# Patient Record
Sex: Male | Born: 1945 | Race: White | Hispanic: No | Marital: Married | Smoking: Never smoker
Health system: Southern US, Community
[De-identification: ages and names within clinical notes are randomized; demographics above are authoritative.]

## PROBLEM LIST (undated history)

## (undated) DIAGNOSIS — I499 Cardiac arrhythmia, unspecified: Secondary | ICD-10-CM

## (undated) DIAGNOSIS — I10 Essential (primary) hypertension: Secondary | ICD-10-CM

## (undated) DIAGNOSIS — M51369 Other intervertebral disc degeneration, lumbar region without mention of lumbar back pain or lower extremity pain: Secondary | ICD-10-CM

## (undated) DIAGNOSIS — K219 Gastro-esophageal reflux disease without esophagitis: Secondary | ICD-10-CM

## (undated) DIAGNOSIS — N400 Enlarged prostate without lower urinary tract symptoms: Secondary | ICD-10-CM

## (undated) DIAGNOSIS — D649 Anemia, unspecified: Secondary | ICD-10-CM

## (undated) DIAGNOSIS — E859 Amyloidosis, unspecified: Secondary | ICD-10-CM

## (undated) DIAGNOSIS — M5136 Other intervertebral disc degeneration, lumbar region: Secondary | ICD-10-CM

## (undated) DIAGNOSIS — R52 Pain, unspecified: Secondary | ICD-10-CM

## (undated) DIAGNOSIS — T7840XA Allergy, unspecified, initial encounter: Secondary | ICD-10-CM

## (undated) DIAGNOSIS — R008 Other abnormalities of heart beat: Secondary | ICD-10-CM

## (undated) DIAGNOSIS — I340 Nonrheumatic mitral (valve) insufficiency: Secondary | ICD-10-CM

## (undated) DIAGNOSIS — A809 Acute poliomyelitis, unspecified: Secondary | ICD-10-CM

## (undated) DIAGNOSIS — J3089 Other allergic rhinitis: Secondary | ICD-10-CM

## (undated) DIAGNOSIS — R55 Syncope and collapse: Secondary | ICD-10-CM

## (undated) HISTORY — PX: FOOT SURGERY: SHX648

## (undated) HISTORY — PX: PILONIDAL CYST / SINUS EXCISION: SUR543

## (undated) HISTORY — PX: NECK SURGERY: SHX720

## (undated) HISTORY — PX: OTHER SURGICAL HISTORY: SHX169

---

## 1982-12-10 HISTORY — PX: CIRCUMCISION: SUR203

## 1999-11-21 ENCOUNTER — Encounter: Payer: Self-pay | Admitting: Neurosurgery

## 1999-11-23 ENCOUNTER — Encounter: Payer: Self-pay | Admitting: Neurosurgery

## 1999-11-23 ENCOUNTER — Inpatient Hospital Stay (HOSPITAL_COMMUNITY): Admission: RE | Admit: 1999-11-23 | Discharge: 1999-11-25 | Payer: Self-pay | Admitting: Neurosurgery

## 1999-12-15 ENCOUNTER — Encounter: Payer: Self-pay | Admitting: Neurosurgery

## 1999-12-15 ENCOUNTER — Encounter: Admission: RE | Admit: 1999-12-15 | Discharge: 1999-12-15 | Payer: Self-pay | Admitting: Neurosurgery

## 2000-01-17 ENCOUNTER — Encounter: Admission: RE | Admit: 2000-01-17 | Discharge: 2000-01-17 | Payer: Self-pay | Admitting: Neurosurgery

## 2000-01-17 ENCOUNTER — Encounter: Payer: Self-pay | Admitting: Neurosurgery

## 2000-02-19 ENCOUNTER — Encounter: Admission: RE | Admit: 2000-02-19 | Discharge: 2000-02-19 | Payer: Self-pay | Admitting: Neurosurgery

## 2000-02-19 ENCOUNTER — Encounter: Payer: Self-pay | Admitting: Neurosurgery

## 2006-11-25 ENCOUNTER — Ambulatory Visit: Payer: Self-pay | Admitting: Gastroenterology

## 2012-03-10 HISTORY — PX: COLONOSCOPY: SHX174

## 2012-03-28 ENCOUNTER — Ambulatory Visit: Payer: Self-pay | Admitting: Gastroenterology

## 2012-10-29 ENCOUNTER — Encounter (HOSPITAL_COMMUNITY): Payer: Self-pay

## 2012-10-29 ENCOUNTER — Emergency Department (HOSPITAL_COMMUNITY): Payer: 59

## 2012-10-29 ENCOUNTER — Observation Stay (HOSPITAL_COMMUNITY)
Admission: EM | Admit: 2012-10-29 | Discharge: 2012-10-31 | Disposition: A | Discharge status: Home / Self Care | Payer: 59 | Attending: Cardiology | Admitting: Cardiology

## 2012-10-29 DIAGNOSIS — Z79899 Other long term (current) drug therapy: Secondary | ICD-10-CM | POA: Insufficient documentation

## 2012-10-29 DIAGNOSIS — Z7982 Long term (current) use of aspirin: Secondary | ICD-10-CM | POA: Insufficient documentation

## 2012-10-29 DIAGNOSIS — I4510 Unspecified right bundle-branch block: Secondary | ICD-10-CM | POA: Insufficient documentation

## 2012-10-29 DIAGNOSIS — I10 Essential (primary) hypertension: Secondary | ICD-10-CM

## 2012-10-29 DIAGNOSIS — R809 Proteinuria, unspecified: Secondary | ICD-10-CM

## 2012-10-29 DIAGNOSIS — R0602 Shortness of breath: Secondary | ICD-10-CM | POA: Insufficient documentation

## 2012-10-29 DIAGNOSIS — R55 Syncope and collapse: Principal | ICD-10-CM

## 2012-10-29 DIAGNOSIS — R7989 Other specified abnormal findings of blood chemistry: Secondary | ICD-10-CM | POA: Insufficient documentation

## 2012-10-29 DIAGNOSIS — Z889 Allergy status to unspecified drugs, medicaments and biological substances status: Secondary | ICD-10-CM | POA: Insufficient documentation

## 2012-10-29 DIAGNOSIS — I452 Bifascicular block: Secondary | ICD-10-CM

## 2012-10-29 HISTORY — DX: Essential (primary) hypertension: I10

## 2012-10-29 HISTORY — DX: Nonrheumatic mitral (valve) insufficiency: I34.0

## 2012-10-29 HISTORY — DX: Syncope and collapse: R55

## 2012-10-29 LAB — HEMOGLOBIN A1C
Hgb A1c MFr Bld: 5.7 % — ABNORMAL HIGH (ref <5.7)
Mean Plasma Glucose: 117 mg/dL — ABNORMAL HIGH (ref <117)

## 2012-10-29 LAB — URINALYSIS, ROUTINE W REFLEX MICROSCOPIC
Hgb urine dipstick: NEGATIVE
Nitrite: NEGATIVE
Protein, ur: >300 mg/dL — AB
Specific Gravity, Urine: 1.017 (ref 1.005–1.030)
Urobilinogen, UA: 1 mg/dL (ref 0.0–1.0)

## 2012-10-29 LAB — CBC WITH DIFFERENTIAL/PLATELET
Eosinophils Relative: 3 % (ref 0–5)
HCT: 35.4 % — ABNORMAL LOW (ref 39.0–52.0)
Hemoglobin: 12.8 g/dL — ABNORMAL LOW (ref 13.0–17.0)
Lymphocytes Relative: 20 % (ref 12–46)
Lymphs Abs: 1.6 10*3/uL (ref 0.7–4.0)
MCV: 88.1 fL (ref 78.0–100.0)
Monocytes Absolute: 0.8 10*3/uL (ref 0.1–1.0)
Monocytes Relative: 10 % (ref 3–12)
Neutro Abs: 5.3 10*3/uL (ref 1.7–7.7)
RDW: 13.3 % (ref 11.5–15.5)
WBC: 8 10*3/uL (ref 4.0–10.5)

## 2012-10-29 LAB — COMPREHENSIVE METABOLIC PANEL
AST: 45 U/L — ABNORMAL HIGH (ref 0–37)
BUN: 23 mg/dL (ref 6–23)
CO2: 24 mEq/L (ref 19–32)
Calcium: 8.3 mg/dL — ABNORMAL LOW (ref 8.4–10.5)
Chloride: 105 mEq/L (ref 96–112)
Creatinine, Ser: 0.98 mg/dL (ref 0.50–1.35)
GFR calc Af Amer: >90 mL/min (ref >90)
GFR calc non Af Amer: 83 mL/min — ABNORMAL LOW (ref >90)
Glucose, Bld: 102 mg/dL — ABNORMAL HIGH (ref 70–99)
Total Bilirubin: 0.3 mg/dL (ref 0.3–1.2)

## 2012-10-29 LAB — PROTIME-INR: Prothrombin Time: 13.8 seconds (ref 11.6–15.2)

## 2012-10-29 LAB — URINE MICROSCOPIC-ADD ON

## 2012-10-29 LAB — GLUCOSE, CAPILLARY: Glucose-Capillary: 103 mg/dL — ABNORMAL HIGH (ref 70–99)

## 2012-10-29 LAB — D-DIMER, QUANTITATIVE: D-Dimer, Quant: <0.27 ug/mL-FEU (ref 0.00–0.48)

## 2012-10-29 LAB — POCT I-STAT TROPONIN I

## 2012-10-29 LAB — TROPONIN I: Troponin I: <0.3 ng/mL (ref <0.30)

## 2012-10-29 MED ORDER — MONTELUKAST SODIUM 10 MG PO TABS
10.0000 mg | ORAL_TABLET | Freq: Every day | ORAL | Status: DC
Start: 2012-10-29 — End: 2012-10-31
  Administered 2012-10-29 – 2012-10-30 (×2): 10 mg via ORAL
  Filled 2012-10-29 (×3): qty 1

## 2012-10-29 MED ORDER — LORATADINE 10 MG PO TABS
10.0000 mg | ORAL_TABLET | Freq: Every day | ORAL | Status: DC
  Administered 2012-10-29 – 2012-10-30 (×2): 10 mg via ORAL
  Filled 2012-10-29 (×3): qty 1

## 2012-10-29 MED ORDER — SODIUM CHLORIDE 0.9 % IV SOLN
INTRAVENOUS | Status: DC
  Administered 2012-10-29: 17:00:00 via INTRAVENOUS

## 2012-10-29 MED ORDER — SODIUM CHLORIDE 0.9 % IV SOLN
Freq: Once | INTRAVENOUS | Status: AC
  Administered 2012-10-29: 15:00:00 via INTRAVENOUS

## 2012-10-29 MED ORDER — ENOXAPARIN SODIUM 40 MG/0.4ML ~~LOC~~ SOLN
40.0000 mg | SUBCUTANEOUS | Status: DC
  Administered 2012-10-29 – 2012-10-31 (×3): 40 mg via SUBCUTANEOUS
  Filled 2012-10-29 (×3): qty 0.4

## 2012-10-29 MED ORDER — PANTOPRAZOLE SODIUM 40 MG PO TBEC
40.0000 mg | DELAYED_RELEASE_TABLET | Freq: Every day | ORAL | Status: DC
  Administered 2012-10-29 – 2012-10-31 (×3): 40 mg via ORAL
  Filled 2012-10-29 (×3): qty 1

## 2012-10-29 MED ORDER — LEVOCETIRIZINE DIHYDROCHLORIDE 5 MG PO TABS: 5.0000 mg | ORAL_TABLET | Freq: Every day | ORAL | Status: DC

## 2012-10-29 MED ORDER — ACETAMINOPHEN 500 MG PO TABS
500.0000 mg | ORAL_TABLET | Freq: Three times a day (TID) | ORAL | Status: DC | PRN
  Filled 2012-10-29: qty 2

## 2012-10-29 MED ORDER — ASPIRIN EC 81 MG PO TBEC
81.0000 mg | DELAYED_RELEASE_TABLET | Freq: Every evening | ORAL | Status: DC
  Administered 2012-10-29 – 2012-10-31 (×3): 81 mg via ORAL
  Filled 2012-10-29 (×3): qty 1

## 2012-10-29 MED ORDER — SODIUM CHLORIDE 0.9 % IJ SOLN
3.0000 mL | Freq: Two times a day (BID) | INTRAMUSCULAR | Status: DC
  Administered 2012-10-30 (×2): 3 mL via INTRAVENOUS

## 2012-10-29 NOTE — ED Notes (Signed)
Checked patient sugar it was 103 notified RN Lori of blood sugar

## 2012-10-29 NOTE — Progress Notes (Signed)
3:45 PM  Date: 10/29/2012  Rate: 103  Rhythm: sinus tachycardia  QRS Axis: normal  Intervals: normal QRS:  Left ventricular hypertrophy  ST/T Wave abnormalities: normal  Conduction Disutrbances:none  Narrative Interpretation: Abnormal EKG  Old EKG Reviewed: changes noted--had normal EKG on 10/02/2001.  Elderly man has had rapid downhill course with back pain, altered mental status in past 3 days.  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 Physical exam shows him to be an elderly man who seems mentally slow but is cooperative.  Chest clear, heart sounds normal.  Abdomen soft, no mass or tenderness.  He is awake and alert, no sensory or motor deficit.  Lab workup including CT angio of abdomen and pelvis to evaluate his status.

## 2012-10-29 NOTE — H&P (Signed)
Hospital Admission Note Date: 10/29/2012  Patient name: Eugene Martinez Medical record number: [id number] Date of birth: January 09, 1946 Age: 66 y.o. Gender: male PCP: DEFAULT,PROVIDER, MD  Medical Service: Cardiology  Chief Complaint: near syncope  History of Present Illness: The patient is a pleasant 66 YO man who comes in today with near syncope at work after climbing two sets of stairs. He has been feeling well otherwise. No N/V/D in the last week. He saw his PCP last week and a blood pressure medicine HCTZ was stopped due to feelings of dizziness and SOB. He states that after 3 flights of stairs he gets SOB. No PND. No syncope in the past. No jerking movements or total LOC during event. No cardiac history or MI history or stents. He has had distant stress test which he states was normal and distant echo which he states was normal. He has no family history of any cardiac problems. He ate breakfast the morning of episode and glucose check was 100s by ED staff upon arrival. He stood for CXR without any feelings of dizziness. His only PMH is HTN, BPH, and allergies. He is taking terazosin and lisinopril and allergy medications. He is currently feeling well and not dizzy. No old EKG but RBBB on EKG. He states he had been told he had a benign murmur in the past.   Meds: Current Outpatient Rx  Name  Route  Sig  Dispense  Refill  . ACETAMINOPHEN 500 MG PO TABS   Oral   Take 500-1,000 mg by mouth every 8 (eight) hours as needed. For sinus headaches         . ASPIRIN EC 81 MG PO TBEC   Oral   Take 81 mg by mouth every evening.         . AZELASTINE HCL 137 MCG/SPRAY NA SOLN   Nasal   Place 1-2 sprays into the nose every 12 (twelve) hours as needed. Use in each nostril as directed. For allergies         . VITAMIN D 1000 UNITS PO TABS   Oral   Take 1,000 Units by mouth daily.         Marland Kitchen FINASTERIDE 5 MG PO TABS   Oral   Take 5 mg by mouth daily.         Marland Kitchen LEVOCETIRIZINE  DIHYDROCHLORIDE 5 MG PO TABS   Oral   Take 5 mg by mouth at bedtime. To relieve allergy symptoms         . LISINOPRIL 40 MG PO TABS   Oral   Take 40 mg by mouth daily. For blood pressure         . MONTELUKAST SODIUM 10 MG PO TABS   Oral   Take 10 mg by mouth at bedtime.         . CVS SPECTRAVITE SENIOR PO TABS   Oral   Take 1 tablet by mouth daily.         Marland Kitchen FISH OIL 1200 MG PO CAPS   Oral   Take 1,200 mg by mouth 2 (two) times daily. Takes 1 in the morning and 1 at night         . OMEPRAZOLE 20 MG PO CPDR   Oral   Take 20 mg by mouth daily before breakfast. Takes 30 minutes before breakfast for acid reflux         . TAMSULOSIN HCL 0.4 MG PO CAPS   Oral   Take 0.4 mg by  mouth daily.         . TRIAMCINOLONE ACETONIDE 55 MCG/ACT NA INHA   Nasal   Place 1-2 sprays into the nose daily as needed. For allergies         . VITAMIN E 400 UNITS PO CAPS   Oral   Take 400 Units by mouth daily.           Allergies: Allergies as of 10/29/2012  . (No Known Allergies)   Past Medical History  Diagnosis Date  . Hypertension    History reviewed. No pertinent past surgical history. No family history on file. History   Social History  . Marital Status: Married    Spouse Name: N/A    Number of Children: N/A  . Years of Education: N/A   Occupational History  . Not on file.   Social History Main Topics  . Smoking status: Never Smoker   . Smokeless tobacco: Never Used  . Alcohol Use: No  . Drug Use: No  . Sexually Active:    Other Topics Concern  . Not on file   Social History Narrative  . No narrative on file    Review of Systems: Pertinent items are noted in HPI.  Physical Exam: Blood pressure 87/46, pulse 91, temperature 97.8 F (36.6 C), temperature source Oral, resp. rate 15, SpO2 97.00%. General: resting in bed, in no distress HEENT: PERRL, EOMI, no scleral icterus Cardiac: S1 S2 heard, S3 heard, no carotid bruits auscultated Pulm:  clear to auscultation bilaterally, moving normal volumes of air Abd: soft, nontender, nondistended, BS present Ext: warm and well perfused, no pedal edema Neuro: alert and oriented X3, cranial nerves II-XII grossly intact   Lab results: Basic Metabolic Panel:  Basename 10/29/12 1429  NA 137  K 4.3  CL 105  CO2 24  GLUCOSE 102*  BUN 23  CREATININE 0.98  CALCIUM 8.3*  MG --  PHOS --   Liver Function Tests:  Basename 10/29/12 1429  AST 45*  ALT 61*  ALKPHOS 49  BILITOT 0.3  PROT 4.9*  ALBUMIN 2.6*   CBC:  Basename 10/29/12 1429  WBC 8.0  NEUTROABS 5.3  HGB 12.8*  HCT 35.4*  MCV 88.1  PLT 153   Cardiac Enzymes: POC troponin - 0.02 (negative) CBG:  Basename 10/29/12 1410  GLUCAP 103*   Coagulation:  Basename 10/29/12 1429  LABPROT 13.8  INR 1.07   Urinalysis:  Basename 10/29/12 1539  COLORURINE YELLOW  LABSPEC 1.017  PHURINE 6.0  GLUCOSEU NEGATIVE  HGBUR NEGATIVE  BILIRUBINUR NEGATIVE  KETONESUR NEGATIVE  PROTEINUR >300*  UROBILINOGEN 1.0  NITRITE NEGATIVE  LEUKOCYTESUR NEGATIVE   Imaging results:  Dg Chest 2 View  10/29/2012  *RADIOLOGY REPORT*  Clinical Data: Syncope  CHEST - 2 VIEW  Comparison: None.  Findings: No focal airspace consolidation or pulmonary edema. There is a tiny right pleural effusion. Cardiopericardial silhouette is at upper limits of normal for size. Imaged bony structures of the thorax are intact.  IMPRESSION: Tiny right pleural effusion.  Otherwise unremarkable.   Original Report Authenticated By: Kennith Center, M.D.     Other results: EKG: there are no previous tracings available for comparison, RBBB, possible left anterior fascicular block.  Assessment & Plan by Problem: 1. Pre-syncope - The patient was likely vasovagal although RBBB is possibly a new finding and could be concerning. Would admit to telemetry and monitor overnight with Echo tomorrow. Will trend troponin and would check EKG in the morning. Would check  orthostatic  vital signs on admission. Will check HgA1c and d-dimer.   2. Hypertension - The patient is more hypotensive at this time and would hold all BP medications. He has recently been trying to lose weight and this may be that he is now on more BP meds than he needs.   3. Allergies - Can be continued on his home allergy medications.   4. Proteinuria -  Should be worked up with repeat U/A and may need SPEP/UPEP.   5. DVT ppx - lovenox Sharon daily.   SignedGenella Mech 10/29/2012, 4:37 PM   This pleasant gentleman was seen in the emergency room with Dr. Dorise Hiss.  He lives in the [place] area and normally receives his medical care through the [facility].  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  He had been sitting listening to lectures all morning.  When the group broke to go to lunch he had climbed 2 flights of stairs and became short of breath and had near syncope which was witnessed by his coworkers.  They called 911 and he was brought to the emergency room.  The patient does not have any history of known previous heart problems.  He has not been told of an abnormal EKG in the past.  Perhaps 10 or 20 years ago he remembers having an echocardiogram and a stress test and was told that he was okay.  The patient has been in good general health.  He denies any anemia or chronic disease.  He does not have any history of exertional chest pain.  He has noted some exertional dyspnea recently.  He sleeps on 2 pillows but denies paroxysmal nocturnal dyspnea, and he has not been experiencing any peripheral edema.  He has been experiencing some lightheadedness and his primary care provider recently stopped his hydrochlorothiazide because of a finding of low blood pressure. 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  We do not have any prior EKGs to  compare.  His chest x-ray shows borderline cardiomegaly but no overt CHF.  Laboratory studies in the emergency room show mild anemia and mild elevation of liver function studies.  His initial troponin level is normal. His physical examination reveals clear lung fields.  He does have a soft systolic murmur at the left sternal edge and he has a soft S3 gallop rhythm. Initial impression is that the patient has had syncope probably secondary to vasovagal reaction, with low blood pressure secondary to blood pressure medications which are being held.  However his physical finding suggests possible left ventricular systolic dysfunction with an S3 gallop and heart rate of 98 and borderline cardiomegaly on chest x-ray.  The patient will be admitted to the CDU on telemetry.  We will obtain echocardiogram to evaluate LV function.  If LV function is significantly depressed he may need cardiac catheterization.  We will followup on abnormal liver function studies and get a followup CBC.  Will get a d-dimer although he has not had any pleuritic chest pain or any chest pain.

## 2012-10-29 NOTE — ED Notes (Signed)
Pt. Was at the [facility] and had a near syncopal episode.  Employees observed pt. Walking up 2 flights of steps and became sob. Pt. Felt like he was going to pass out and leaned against the wall and sat down.  Denies hitting his head. Pt. Remembers waking up on the floor.  Pt. Denies any sob or chest pain presently.  Pt. Was seen last week with Dr. Kennon Rounds for sob , They did not find anything abnormal. Pt. Is pale , warm and dry

## 2012-10-29 NOTE — ED Provider Notes (Signed)
History     CSN: [id number]  Arrival date & time 10/29/12  1323   First MD Initiated Contact with Patient 10/29/12 1406      Chief Complaint  Patient presents with  . Near Syncope    (Consider location/radiation/quality/duration/timing/severity/associated sxs/prior treatment) HPI Comments: Pt is a 66 year old man who was at a conference at [employer].  He climbed two flights of stairs and became short of breath and dizzy, collapsed to the floor, and nearly lost consciousness.  He had seen his doctor last Thursday and had some medications for high blood pressure  discontinued.  Patient is a 66 y.o. male presenting with syncope. The history is provided by the patient. No language interpreter was used.  Loss of Consciousness This is a new problem. The current episode started less than 1 hour ago. Episode frequency: One episode of near syncope today. The problem has been rapidly improving. Associated symptoms include shortness of breath. Pertinent negatives include no chest pain and no abdominal pain. Exacerbated by: Precipitated by climbing two flights of stairs. Nothing relieves the symptoms. Treatments tried: To [facility] ED via EMS for evaluation.    Past Medical History  Diagnosis Date  . Hypertension     History reviewed. No pertinent past surgical history.  No family history on file.  History  Substance Use Topics  . Smoking status: Never Smoker   . Smokeless tobacco: Never Used  . Alcohol Use: No      Review of Systems  Constitutional: Negative for fever and chills.  HENT: Negative.   Eyes: Negative.   Respiratory: Positive for shortness of breath.   Cardiovascular: Positive for syncope. Negative for chest pain.  Gastrointestinal: Negative.  Negative for abdominal pain.  Genitourinary: Negative.   Musculoskeletal: Negative.   Skin: Negative.   Neurological: Positive for syncope.  Psychiatric/Behavioral: Negative.     Allergies  Review of patient's  allergies indicates no known allergies.  Home Medications   Current Outpatient Rx  Name  Route  Sig  Dispense  Refill  . ACETAMINOPHEN 500 MG PO TABS   Oral   Take 500-1,000 mg by mouth every 8 (eight) hours as needed. For sinus headaches         . ASPIRIN EC 81 MG PO TBEC   Oral   Take 81 mg by mouth every evening.         . AZELASTINE HCL 137 MCG/SPRAY NA SOLN   Nasal   Place 1-2 sprays into the nose every 12 (twelve) hours as needed. Use in each nostril as directed. For allergies         . VITAMIN D 1000 UNITS PO TABS   Oral   Take 1,000 Units by mouth daily.         Marland Kitchen FINASTERIDE 5 MG PO TABS   Oral   Take 5 mg by mouth daily.         Marland Kitchen LEVOCETIRIZINE DIHYDROCHLORIDE 5 MG PO TABS   Oral   Take 5 mg by mouth at bedtime. To relieve allergy symptoms         . LISINOPRIL 40 MG PO TABS   Oral   Take 40 mg by mouth daily. For blood pressure         . MONTELUKAST SODIUM 10 MG PO TABS   Oral   Take 10 mg by mouth at bedtime.         . CVS SPECTRAVITE SENIOR PO TABS   Oral   Take  1 tablet by mouth daily.         Marland Kitchen FISH OIL 1200 MG PO CAPS   Oral   Take 1,200 mg by mouth 2 (two) times daily. Takes 1 in the morning and 1 at night         . OMEPRAZOLE 20 MG PO CPDR   Oral   Take 20 mg by mouth daily before breakfast. Takes 30 minutes before breakfast for acid reflux         . TAMSULOSIN HCL 0.4 MG PO CAPS   Oral   Take 0.4 mg by mouth daily.         . TRIAMCINOLONE ACETONIDE 55 MCG/ACT NA INHA   Nasal   Place 1-2 sprays into the nose daily as needed. For allergies         . VITAMIN E 400 UNITS PO CAPS   Oral   Take 400 Units by mouth daily.           BP 97/54  Pulse 97  Temp 97.8 F (36.6 C) (Oral)  Resp 18  SpO2 97%  Physical Exam  Nursing note and vitals reviewed. Constitutional: He is oriented to person, place, and time. He appears well-developed and well-nourished.       In no distress.  BP in mid 90's systolic.  HENT:   Head: Normocephalic and atraumatic.  Right Ear: External ear normal.  Left Ear: External ear normal.  Mouth/Throat: Oropharynx is clear and moist.  Eyes: Conjunctivae normal and EOM are normal. Pupils are equal, round, and reactive to light.  Neck: Normal range of motion. Neck supple.  Cardiovascular: Normal rate, regular rhythm and normal heart sounds.   Pulmonary/Chest: Effort normal and breath sounds normal.  Abdominal: Soft. Bowel sounds are normal.  Genitourinary:       Rectal showed no mass or tenderness.  Musculoskeletal: Normal range of motion.  Neurological: He is alert and oriented to person, place, and time. He has normal reflexes.  Skin: Skin is warm and dry.  Psychiatric: He has a normal mood and affect. His behavior is normal.    ED Course  Procedures (including critical care time)  Labs Reviewed  GLUCOSE, CAPILLARY - Abnormal; Notable for the following:    Glucose-Capillary 103 (*)     All other components within normal limits    Date: 10/29/2012  Rate:95  Rhythm: normal sinus rhythm  QRS Axis: left  Intervals: QT prolonged QRS:  Left atrial abnormality  ST/T Wave abnormalities: normal  Conduction Disutrbances:right bundle branch block and left anterior fascicular block  Narrative Interpretation: Abnormal EKG  Old EKG Reviewed: none available  4:01 PM Results for orders placed during the hospital encounter of 10/29/12  GLUCOSE, CAPILLARY      Component Value Range   Glucose-Capillary 103 (*) 70 - 99 mg/dL  CBC WITH DIFFERENTIAL      Component Value Range   WBC 8.0  4.0 - 10.5 K/uL   RBC 4.02 (*) 4.22 - 5.81 MIL/uL   Hemoglobin 12.8 (*) 13.0 - 17.0 g/dL   HCT 45.4 (*) 09.8 - 11.9 %   MCV 88.1  78.0 - 100.0 fL   MCH 31.8  26.0 - 34.0 pg   MCHC 36.2 (*) 30.0 - 36.0 g/dL   RDW 14.7  82.9 - 56.2 %   Platelets 153  150 - 400 K/uL   Neutrophils Relative 66  43 - 77 %   Neutro Abs 5.3  1.7 - 7.7 K/uL   Lymphocytes  Relative 20  12 - 46 %   Lymphs Abs 1.6   0.7 - 4.0 K/uL   Monocytes Relative 10  3 - 12 %   Monocytes Absolute 0.8  0.1 - 1.0 K/uL   Eosinophils Relative 3  0 - 5 %   Eosinophils Absolute 0.2  0.0 - 0.7 K/uL   Basophils Relative 0  0 - 1 %   Basophils Absolute 0.0  0.0 - 0.1 K/uL  COMPREHENSIVE METABOLIC PANEL      Component Value Range   Sodium 137  135 - 145 mEq/L   Potassium 4.3  3.5 - 5.1 mEq/L   Chloride 105  96 - 112 mEq/L   CO2 24  19 - 32 mEq/L   Glucose, Bld 102 (*) 70 - 99 mg/dL   BUN 23  6 - 23 mg/dL   Creatinine, Ser 6.96  0.50 - 1.35 mg/dL   Calcium 8.3 (*) 8.4 - 10.5 mg/dL   Total Protein 4.9 (*) 6.0 - 8.3 g/dL   Albumin 2.6 (*) 3.5 - 5.2 g/dL   AST 45 (*) 0 - 37 U/L   ALT 61 (*) 0 - 53 U/L   Alkaline Phosphatase 49  39 - 117 U/L   Total Bilirubin 0.3  0.3 - 1.2 mg/dL   GFR calc non Af Amer 83 (*) >90 mL/min   GFR calc Af Amer >90  >90 mL/min  PROTIME-INR      Component Value Range   Prothrombin Time 13.8  11.6 - 15.2 seconds   INR 1.07  0.00 - 1.49  APTT      Component Value Range   aPTT 29  24 - 37 seconds  POCT I-STAT TROPONIN I      Component Value Range   Troponin i, poc 0.02  0.00 - 0.08 ng/mL   Comment 3            Dg Chest 2 View  10/29/2012  *RADIOLOGY REPORT*  Clinical Data: Syncope  CHEST - 2 VIEW  Comparison: None.  Findings: No focal airspace consolidation or pulmonary edema. There is a tiny right pleural effusion. Cardiopericardial silhouette is at upper limits of normal for size. Imaged bony structures of the thorax are intact.  IMPRESSION: Tiny right pleural effusion.  Otherwise unremarkable.   Original Report Authenticated By: Kennith Center, M.D.     Stool negative for blood by hemoccult, test done by me.  Call to [facility] to see pt to evaluate for syncope.  5:17 PM Pt seen and admitted by  [facility].   IMP:  Near syncope.           Carleene Cooper III, MD 10/29/12 [phone]

## 2012-10-30 ENCOUNTER — Observation Stay (HOSPITAL_COMMUNITY): Payer: 59

## 2012-10-30 DIAGNOSIS — I059 Rheumatic mitral valve disease, unspecified: Secondary | ICD-10-CM

## 2012-10-30 LAB — RETICULOCYTES
RBC.: 4.02 MIL/uL — ABNORMAL LOW (ref 4.22–5.81)
Retic Count, Absolute: 48.2 10*3/uL (ref 19.0–186.0)
Retic Ct Pct: 1.2 % (ref 0.4–3.1)

## 2012-10-30 LAB — PRO B NATRIURETIC PEPTIDE: Pro B Natriuretic peptide (BNP): 5284 pg/mL — ABNORMAL HIGH (ref 0–125)

## 2012-10-30 LAB — COMPREHENSIVE METABOLIC PANEL
ALT: 44 U/L (ref 0–53)
CO2: 27 mEq/L (ref 19–32)
Calcium: 8.5 mg/dL (ref 8.4–10.5)
Chloride: 108 mEq/L (ref 96–112)
Creatinine, Ser: 1.07 mg/dL (ref 0.50–1.35)
Glucose, Bld: 92 mg/dL (ref 70–99)
Sodium: 140 mEq/L (ref 135–145)
Total Bilirubin: 0.7 mg/dL (ref 0.3–1.2)

## 2012-10-30 LAB — VITAMIN B12: Vitamin B-12: 299 pg/mL (ref 211–911)

## 2012-10-30 LAB — CBC
MCHC: 35.2 g/dL (ref 30.0–36.0)
RDW: 13.5 % (ref 11.5–15.5)
WBC: 7.9 10*3/uL (ref 4.0–10.5)

## 2012-10-30 LAB — TSH: TSH: 1.029 u[IU]/mL (ref 0.350–4.500)

## 2012-10-30 LAB — TROPONIN I: Troponin I: <0.3 ng/mL (ref <0.30)

## 2012-10-30 MED ORDER — IOHEXOL 300 MG/ML  SOLN
20.0000 mL | INTRAMUSCULAR | Status: AC
  Administered 2012-10-30 (×2): 20 mL via ORAL

## 2012-10-30 MED ORDER — IOHEXOL 300 MG/ML  SOLN
100.0000 mL | Freq: Once | INTRAMUSCULAR | Status: AC | PRN
  Administered 2012-10-30: 100 mL via INTRAVENOUS

## 2012-10-30 NOTE — Progress Notes (Signed)
  Echocardiogram 2D Echocardiogram has been performed.  Danelle Curiale 10/30/2012, 3:09 PM

## 2012-10-30 NOTE — Progress Notes (Signed)
Subjective:  The patient has had no further episodes of dizziness or syncope.  He denies any chest pain or shortness of breath.  He has had a unintended 20 pound weight loss in the past year or so.  He has had early satiety and rarely finishes his meals.  On admission yesterday several of his liver function studies were elevated and he has an unexplained mild anemia and he has a low serum albumin.  He denies any change in bowel habits or any hematochezia or melena or hematuria.  Objective:  Vital Signs in the last 24 hours: Temp:  [97.8 F (36.6 C)-98.3 F (36.8 C)] 98 F (36.7 C) (11/21 0500) Pulse Rate:  [91-105] 95  (11/20 1715) Resp:  [15-25] 18  (11/21 0500) BP: (84-109)/(46-70) 97/62 mmHg (11/21 0500) SpO2:  [95 %-100 %] 95 % (11/21 0500) Weight:  [172 lb (78.019 kg)] 172 lb (78.019 kg) (11/20 1815)  Intake/Output from previous day:   Intake/Output from this shift: Total I/O In: 480 [P.O.:480] Out: 500 [Urine:500]     . [COMPLETED] sodium chloride   Intravenous Once  . aspirin EC  81 mg Oral QPM  . enoxaparin (LOVENOX) injection  40 mg Subcutaneous Q24H  . loratadine  10 mg Oral QHS  . montelukast  10 mg Oral QHS  . pantoprazole  40 mg Oral Daily  . sodium chloride  3 mL Intravenous Q12H  . [DISCONTINUED] levocetirizine  5 mg Oral QHS      . [DISCONTINUED] sodium chloride 160 mL/hr at 10/29/12 1723    Physical Exam: The patient appears to be in no distress.  He appears to be slightly pale.  Head and neck exam reveals that the pupils are equal and reactive.  The extraocular movements are full.  There is no scleral icterus.  Mouth and pharynx are benign.  No lymphadenopathy.  No carotid bruits.  The jugular venous pressure is normal.  Thyroid is not enlarged or tender.  Chest is clear to percussion and auscultation.  No rales or rhonchi.  Expansion of the chest is symmetrical.  Heart reveals a soft S3 gallop.  There is a grade 2/6 systolic murmur along the left  sternal edge.  No pericardial rub.  The abdomen is soft and nontender.  Bowel sounds are normoactive.  There is no hepatosplenomegaly or mass.  There are no abdominal bruits.  Extremities reveal no phlebitis or edema.  Pedal pulses are good.  There is no cyanosis or clubbing.  Neurologic exam reveals decreased strength in his right lower extremity and he wears a brace.  This is a post polio weakness.  No sensory deficits.  Integument reveals no rash  Lab Results:  Basename 10/30/12 0701 10/29/12 1429  WBC 7.9 8.0  HGB 12.5* 12.8*  PLT 153 153    Basename 10/30/12 0701 10/29/12 1429  NA 140 137  K 4.8 4.3  CL 108 105  CO2 27 24  GLUCOSE 92 102*  BUN 18 23  CREATININE 1.07 0.98    Basename 10/30/12 0701 10/30/12 0010  TROPONINI <0.30 <0.30   Hepatic Function Panel  Basename 10/30/12 0701  PROT 4.5*  ALBUMIN 2.3*  AST 27  ALT 44  ALKPHOS 43  BILITOT 0.7  BILIDIR --  IBILI --   No results found for this basename: CHOL in the last 72 hours No results found for this basename: PROTIME in the last 72 hours  Imaging: Dg Chest 2 View  10/29/2012  *RADIOLOGY REPORT*  Clinical Data:  Syncope  CHEST - 2 VIEW  Comparison: None.  Findings: No focal airspace consolidation or pulmonary edema. There is a tiny right pleural effusion. Cardiopericardial silhouette is at upper limits of normal for size. Imaged bony structures of the thorax are intact.  IMPRESSION: Tiny right pleural effusion.  Otherwise unremarkable.   Original Report Authenticated By: Kennith Center, M.D.     Cardiac Studies: Telemetry shows normal sinus rhythm and no cause for syncope. Assessment/Plan:  Patient Active Hospital Problem List: Syncope (10/29/2012)   Assessment:  Blood pressure remains low.  He has had no further dizzy spells or syncope    Plan: continue to monitor.  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  SPEP today   We will allow him to ambulate in the hall today   LOS: 1 day    Cassell Clement 10/30/2012, 8:26 AM

## 2012-10-30 NOTE — Progress Notes (Signed)
Utilization review completed.  

## 2012-10-31 ENCOUNTER — Observation Stay (HOSPITAL_COMMUNITY): Payer: 59

## 2012-10-31 ENCOUNTER — Encounter (HOSPITAL_COMMUNITY): Payer: Self-pay | Admitting: Nurse Practitioner

## 2012-10-31 DIAGNOSIS — I319 Disease of pericardium, unspecified: Secondary | ICD-10-CM

## 2012-10-31 LAB — COMPREHENSIVE METABOLIC PANEL
Alkaline Phosphatase: 46 U/L (ref 39–117)
BUN: 16 mg/dL (ref 6–23)
Calcium: 8.6 mg/dL (ref 8.4–10.5)
Creatinine, Ser: 1.04 mg/dL (ref 0.50–1.35)
GFR calc Af Amer: 83 mL/min — ABNORMAL LOW (ref >90)
Glucose, Bld: 84 mg/dL (ref 70–99)
Total Protein: 4.7 g/dL — ABNORMAL LOW (ref 6.0–8.3)

## 2012-10-31 LAB — LIPID PANEL
Cholesterol: 209 mg/dL — ABNORMAL HIGH (ref 0–200)
LDL Cholesterol: 148 mg/dL — ABNORMAL HIGH (ref 0–99)
VLDL: 24 mg/dL (ref 0–40)

## 2012-10-31 LAB — PRO B NATRIURETIC PEPTIDE: Pro B Natriuretic peptide (BNP): 4841 pg/mL — ABNORMAL HIGH (ref 0–125)

## 2012-10-31 MED ORDER — FUROSEMIDE 20 MG PO TABS: 20.0000 mg | ORAL_TABLET | Freq: Every day | ORAL | Status: DC | PRN

## 2012-10-31 MED ORDER — GADOBENATE DIMEGLUMINE 529 MG/ML IV SOLN
25.0000 mL | Freq: Once | INTRAVENOUS | Status: AC
  Administered 2012-10-31: 25 mL via INTRAVENOUS

## 2012-10-31 MED ORDER — FUROSEMIDE 20 MG PO TABS: 40.0000 mg | ORAL_TABLET | Freq: Every day | ORAL | Status: DC | PRN

## 2012-10-31 NOTE — Discharge Summary (Signed)
Patient ID: [place],  MRN: [id number], DOB/AGE: [birth year] 66 y.o.  Admit date: 10/29/2012 Discharge date: 10/31/2012  Primary Care Provider: [facility] Primary Cardiologist: Eugene Simmer, MD  Discharge Diagnoses Principal Problem:  *Syncope Active Problems:  Hypertension  RBBB (right bundle branch block with left anterior fascicular block)  Proteinuria  Elevated LFT's on admission - resolved  Allergies No Known Allergies  Procedures  2D Echocardiogram 10/30/2012  Study Conclusions  - Left ventricle: Given LVH and speckling of myocardium   consider R/O amyloid if clinically indicated The cavity   size was mildly dilated. Wall thickness was increased in a   pattern of severe LVH. Systolic function was normal. The   estimated ejection fraction was in the range of 55% to   60%. - Mitral valve: Calcified annulus. Mild regurgitation. - Left atrium: The atrium was moderately dilated. - Atrial septum: No defect or patent foramen ovale was   identified. - Pericardium, extracardiac: A trivial pericardial effusion   was identified. _____________  CT Abdomen and Pelvis 10/30/2012  IMPRESSION:  1.  Small pleural effusions and bibasilar atelectasis or possibly pneumonia.  Correlate clinically. 2.  Small pericardial effusion. 3.  Higher attenuation debris within the gallbladder could represent gallbladder sludge versus noncalcified gallstones. 4.  Degenerative disc disease at L5-S1 _____________  Cardiac MRI 10/31/2012  Findings: There were small bilateral pleural effusions and a small circumferential pericardial effusion.  The left ventricle was normal in size with severe concentric LV hypertrophy.  Normal wall motion with EF 66%.  The left atrium was mildly dilated. The right ventricle was normal in size and systolic function. There appeared to be mild RV hypertrophy. Normal right atrial size.  There was mild to moderate mitral regurgitation.   There appeared to be turbulence across the LV outflow tract.  There was no evidence for aortic stenosis.  When delayed enhancement imaging was attempted, we were unable to null the LV myocardium.  Measurements:  LVEDV 108 mL LV SV 72 mL LV EF 66%  IMPRESSION: 1. Normal LV size with severe LV hypertrophy.  EF 66%.  2. Normal RV size and systolic function with at least mild RV hypertrophy.  3. We were unable to null the LV myocardium for delayed enhancement imaging.  4. 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  Would consider abdominal fat pad biopsy and lab workup for amyloidosis. _____________  History of Present Illness  66 y/o male with prior h/o HTN followed by his PCP in [place]  He was recently taken off of his outpatient dose of HCTZ secondary to lightheadedness and relative hypotension.  On the day of admission, pt was attending a conference in [place] when while walking up 2 flights of stairs, he became dyspneic and presyncopal.  This was witnessed by co-workers and 911 was called.  Pt was taken via EMS to the [place] ED where ECG showed sinus rhythm with bifasicular block (RBBB, LAFB) of unknown duration.  He was hypotensive in the ER with a BP of 87/46 but was otherwise asymptomatic.  LFT's were noted to be mildly elevated.  He was observed for further evaluation.  Hospital Course  His home dose of lisinopril was held in the setting of hypotension.  BP's remained low but stable and pt has had no further presyncope.  No arrhythmic events have been noted on telemetry.  CT of the Abdomen and Pelvis were performed 2/2 mildly elevated LFT's and this showed no acute process but  did show higher attenuation debris within the gallbladder possibly representing gallbladder sludge versus noncalcified gallstones.  LFT's normalized and pt had no abdominal pain.  2D echo was performed on 11/21 secondary to presyncope and finding of S3 on exam.   This revealed normal LV function, severe LVH, and myocardial speckling concerning for cardiac amyloid.  As a result, Cardiac MRI was performed earlier today showing normal LV and RV function with severe LV and at least mild RV hypertrophy.  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  Findings were discussed with patient and wife in detail and they verbalize understanding of necessary follow-up.  E[place] will be discharged home today and we have arranged for outpatient f/u on 12/10.  Our office will arrange for referral to general surgery for fat pad biopsy.  Discharge Vitals Blood pressure 102/63, pulse 67, temperature 98 F (36.7 C), temperature source Oral, resp. rate 20, height 5\' 6"  (1.676 m), weight 172 lb (78.019 kg), SpO2 94.00%.  Filed Weights   10/29/12 1815  Weight: 172 lb (78.019 kg)   Labs  CBC  Basename 10/30/12 0701 10/29/12 1429  WBC 7.9 8.0  NEUTROABS -- 5.3  HGB 12.5* 12.8*  HCT 35.5* 35.4*  MCV 88.1 88.1  PLT 153 153   Basic Metabolic Panel  Basename 10/31/12 0451 10/30/12 0701  NA 137 140  K 4.8 4.8  CL 104 108  CO2 28 27  GLUCOSE 84 92  BUN 16 18  CREATININE 1.04 1.07  CALCIUM 8.6 8.5  MG -- --  PHOS -- --   Liver Function Tests  Basename 10/31/12 0451 10/30/12 0701  AST 23 27  ALT 36 44  ALKPHOS 46 43  BILITOT 0.8 0.7  PROT 4.7* 4.5*  ALBUMIN 2.4* 2.3*   Cardiac Enzymes  Basename 10/30/12 0701 10/30/12 0010 10/29/12 1757  CKTOTAL -- -- --  CKMB -- -- --  CKMBINDEX -- -- --  TROPONINI <0.30 <0.30 <0.30   D-Dimer  Basename 10/29/12 1757  DDIMER <0.27   Hemoglobin A1C  Basename 10/29/12 1757  HGBA1C 5.7*   Fasting Lipid Panel  Basename 10/31/12 0451  CHOL 209*  HDL 37*  LDLCALC 148*  TRIG 118  CHOLHDL 5.6  LDLDIRECT --   Thyroid Function Tests  Basename 10/30/12 1421  TSH 1.029  T4TOTAL --  T3FREE --  THYROIDAB --    Disposition  Pt is being discharged home today in good condition.  Follow-up Plans & Appointments  Follow-up Information    Follow up with [place] Clement, MD. On 11/18/2012. (3:45 PM)    Contact information:   [address] [place] [address] [phone]        Discharge Medications    Medication List     As of 10/31/2012 10:37 AM    STOP taking these medications         acetaminophen 500 MG tablet   Commonly known as: TYLENOL      lisinopril 40 MG tablet   Commonly known as: PRINIVIL,ZESTRIL      TAKE these medications         aspirin EC 81 MG tablet   Take 81 mg by mouth every evening.      azelastine 137 MCG/SPRAY nasal spray   Commonly known as: ASTELIN   Place 1-2 sprays into the nose every 12 (twelve) hours as needed. Use in each nostril as directed. For allergies  cholecalciferol 1000 UNITS tablet   Commonly known as: VITAMIN D   Take 1,000 Units by mouth daily.      [employer] Spectravite Senior Tabs   Take 1 tablet by mouth daily.      finasteride 5 MG tablet   Commonly known as: PROSCAR   Take 5 mg by mouth daily.      Fish Oil 1200 MG Caps   Take 1,200 mg by mouth 2 (two) times daily. Takes 1 in the morning and 1 at night      furosemide 20 MG tablet   Commonly known as: LASIX   Take 2 tablets (40 mg total) by mouth daily as needed (daily as needed for weight gain > 2lbs in 1 day or 5lbs over the course of a week, or fluid retention/swelling.).      levocetirizine 5 MG tablet   Commonly known as: XYZAL   Take 5 mg by mouth at bedtime. To relieve allergy symptoms      montelukast 10 MG tablet   Commonly known as: SINGULAIR   Take 10 mg by mouth at bedtime.      omeprazole 20 MG capsule   Commonly known as: PRILOSEC   Take 20 mg by mouth daily before breakfast. Takes 30 minutes before breakfast for acid reflux      Tamsulosin HCl 0.4 MG Caps   Commonly known as: FLOMAX   Take 0.4 mg by mouth daily.       triamcinolone 55 MCG/ACT nasal inhaler   Commonly known as: NASACORT   Place 1-2 sprays into the nose daily as needed. For allergies      vitamin E 400 UNIT capsule   Take 400 Units by mouth daily.      Outstanding Labs/Studies  Immunogloblulin analysis pending @ time of d/c.  Duration of Discharge Encounter   Greater than 30 minutes including physician time.  Signed, Nicolasa Ducking NP 10/31/2012, 10:37 AM

## 2012-10-31 NOTE — Progress Notes (Signed)
Subjective:  The patient has had no further episodes of dizziness or syncope.  He denies any chest pain or shortness of breath.  He has had a unintended 20 pound weight loss in the past year or so.  He has had early satiety and rarely finishes his meals.  On admission yesterday several of his liver function studies were elevated and he has an unexplained mild anemia and he has a low serum albumin.  He denies any change in bowel habits or any hematochezia or melena or hematuria. No further dizziness or syncope. Telemetry stable NSR.  Objective:  Vital Signs in the last 24 hours: Temp:  [97.8 F (36.6 C)-98.1 F (36.7 C)] 98 F (36.7 C) (11/22 0700) Pulse Rate:  [67-94] 67  (11/22 0700) Resp:  [20] 20  (11/21 1513) BP: (102-107)/(58-63) 102/63 mmHg (11/22 0700) SpO2:  [94 %-98 %] 94 % (11/22 0700)  Intake/Output from previous day: 11/21 0701 - 11/22 0700 In: 480 [P.O.:480] Out: 500 [Urine:500] Intake/Output from this shift:       . aspirin EC  81 mg Oral QPM  . enoxaparin (LOVENOX) injection  40 mg Subcutaneous Q24H  . [COMPLETED] iohexol  20 mL Oral Q1 Hr x 2  . loratadine  10 mg Oral QHS  . montelukast  10 mg Oral QHS  . pantoprazole  40 mg Oral Daily  . sodium chloride  3 mL Intravenous Q12H      Physical Exam: The patient appears to be in no distress.  He appears to be slightly pale.  Head and neck exam reveals that the pupils are equal and reactive.  The extraocular movements are full.  There is no scleral icterus.  Mouth and pharynx are benign.  No lymphadenopathy.  No carotid bruits.  The jugular venous pressure is normal.  Thyroid is not enlarged or tender.  Chest is clear to percussion and auscultation.  No rales or rhonchi.  Expansion of the chest is symmetrical.  Heart reveals a soft S3 gallop.  There is a grade 2/6 systolic murmur along the left sternal edge.  No pericardial rub.  The abdomen is soft and nontender.  Bowel sounds are normoactive.  There is no  hepatosplenomegaly or mass.  There are no abdominal bruits.  Extremities reveal no phlebitis or edema.  Pedal pulses are good.  There is no cyanosis or clubbing.  Neurologic exam reveals decreased strength in his right lower extremity and he wears a brace.  This is a post polio weakness.  No sensory deficits.  Integument reveals no rash  Lab Results:  Basename 10/30/12 0701 10/29/12 1429  WBC 7.9 8.0  HGB 12.5* 12.8*  PLT 153 153    Basename 10/31/12 0451 10/30/12 0701  NA 137 140  K 4.8 4.8  CL 104 108  CO2 28 27  GLUCOSE 84 92  BUN 16 18  CREATININE 1.04 1.07    Basename 10/30/12 0701 10/30/12 0010  TROPONINI <0.30 <0.30   Hepatic Function Panel  Basename 10/31/12 0451  PROT 4.7*  ALBUMIN 2.4*  AST 23  ALT 36  ALKPHOS 46  BILITOT 0.8  BILIDIR --  IBILI --    Basename 10/31/12 0451  CHOL 209*   No results found for this basename: PROTIME in the last 72 hours  Imaging: Dg Chest 2 View  10/29/2012  *RADIOLOGY REPORT*  Clinical Data: Syncope  CHEST - 2 VIEW  Comparison: None.  Findings: No focal airspace consolidation or pulmonary edema. There is a tiny  right pleural effusion. Cardiopericardial silhouette is at upper limits of normal for size. Imaged bony structures of the thorax are intact.  IMPRESSION: Tiny right pleural effusion.  Otherwise unremarkable.   Original Report Authenticated By: Kennith Center, M.D.    Ct Abdomen Pelvis W Contrast  10/30/2012  *RADIOLOGY REPORT*  Clinical Data: Weight loss, anorexia, elevated liver function tests  CT ABDOMEN AND PELVIS WITH CONTRAST  Technique:  Multidetector CT imaging of the abdomen and pelvis was performed following the standard protocol during bolus administration of intravenous contrast.  Contrast: OMNIPAQUE IOHEXOL 300 MG/ML  SOLN  Comparison: None.  Findings: There are small pleural effusions noted at the lung bases with basilar atelectasis and possible pneumonia involving the posterior lower lobes  bilaterally.  There does appear to be a small pericardial effusion present.  The liver enhances with no focal abnormality and no ductal dilatation is seen.  There is slightly higher attenuation noted within the gallbladder.  This may represent gallbladder sludge, but noncalcified gallstones cannot be excluded.  The pancreas is normal in size although somewhat atrophic and the pancreatic duct is not dilated.  The adrenal glands and spleen are unremarkable.  The stomach is not well distended.  The kidneys enhance and there is a cyst emanating from the upper pole of the left kidney measuring 3.1 cm in diameter.  No renal calculi are seen and on delayed images, the pelvocaliceal systems are unremarkable.  The abdominal aorta is normal in caliber.  Mild atheromatous change is present.  No adenopathy is seen.  The urinary bladder is moderately urine distended with no abnormality noted.  The prostate is normal in size.  No fluid is seen within the pelvis.  No abnormality of the colon is seen.  The terminal ileum is unremarkable.  The appendix is small with no evidence of inflammatory change.  There are degenerative changes in the hips with subchondral cyst formation noted.  Degenerative disc disease is present particularly at L5- S1.  IMPRESSION:  1.  Small pleural effusions and bibasilar atelectasis or possibly pneumonia.  Correlate clinically. 2.  Small pericardial effusion. 3.  Higher attenuation debris within the gallbladder could represent gallbladder sludge versus noncalcified gallstones. 4.  Degenerative disc disease at L5-S1   Original Report Authenticated By: Dwyane Dee, M.D.     Cardiac Studies: Telemetry shows normal sinus rhythm and no cause for syncope.  2D echo: LV EF: 55% - 60%  ------------------------------------------------------------ Indications: Syncope 780.2.  ------------------------------------------------------------ History: Risk factors: RBBB. Proteinuria.  Hypertension.  ------------------------------------------------------------ Study Conclusions  - Left ventricle: Given LVH and speckling of myocardium consider R/O amyloid if clinically indicated The cavity size was mildly dilated. Wall thickness was increased in a pattern of severe LVH. Systolic function was normal. The estimated ejection fraction was in the range of 55% to 60%. - Mitral valve: Calcified annulus. Mild regurgitation. - Left atrium: The atrium was moderately dilated. - Atrial septum: No defect or patent foramen ovale was identified. - Pericardium, extracardiac: A trivial pericardial effusion was identified. Transthoracic echocardiography. M-mode, complete 2D, spectral Doppler, and color Doppler. Height: Height: 167.6cm. Height: 66in. Weight: Weight: 78kg. Weight: 171.6lb. Body mass index: BMI: 27.8kg/m^2. Body surface area: BSA: 1.67m^2. Blood pressure: 97/62.   Assessment/Plan:  Patient Active Hospital Problem List: Syncope (10/29/2012)   Assessment:  Blood pressure remains low.  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  Will get cardiac MRI today.  Hypertension (10/29/2012)  Assessment:  blood pressure is low off medication    Plan:  continue to hold lisinopril RBBB (right bundle branch block with left anterior fascicular block) (10/29/2012)   Assessment:  EKG today is unchanged    Plan:  continue telemetry  Proteinuria (10/29/2012)   Assessment:  low serum albumin and we will get a serum immunoglobullins and urine immunoglobulins.  Unexplained weight loss and elevated LFTs on admission    Plan: CT of abdomen negative for malignancy. Anemia uncertain etiology     Plan: Anemia panel shows no evidence of iron deficiency.  Overall plan: Get MRI today. Hopefully discharge this evening and follow up in office.He is observation status right now. Immunoglobulin analysis will probably take a few days.   If  this turns out to be amyloidosis, Up to Date has the following helpful information re medical treatment: Medical therapy - Loop diuretics are a mainstay of the management of heart failure. If edema is severe, hospitalization and a course of intravenous diuretics should be strongly considered. This should be accompanied by careful monitoring of blood pressure and renal function, as overvigorous diuresis may result in progressive azotemia. Aldosterone antagonist therapy (eg, spironolactone) in conjunction with loop diuretics is generally tolerated without the development of excessive hypotension. Although beta blockers reduce morbidity and mortality in patients with systolic heart failure generally, they have no proven benefit in patients with heart failure due to cardiac amyloidosis. Indeed, they may worsen heart failure in patients with cardiac amyloidosis in whom cardiac output is dependent on heart rate due to presence of a low, fixed stroke volume. The safety and efficacy of angiotensin converting enzyme (ACE) inhibitors or angiotensin receptor blockers (ARBs) in patients with cardiac amyloidosis is uncertain. There are no clinical trials of ACE inhibitors or ARBs in amyloidosis, but clinical experience has shown that these agents often provoke profound hypotension in AL amyloidosis, possibly by exposing a subclinical autonomic neuropathy. ACE inhibitors and ARBs appear to be better tolerated in patients with senile cardiac amyloidosis, in whom autonomic neuropathy is rare. Tolerability of ACE inhibitors and ARBs in TTR cardiomyopathy due to a mutant protein depends on the presence or absence of concomitant neuropathic features. If a trial of ACE inhibition is attempted in a patient with AL amyloidosis, initiation should be with a very low dose of captopril (eg, 3.125 mg) with careful blood pressure monitoring and slow, carefully monitored up-titration of the dose if tolerated.  Amyloid fibrils bind to  digoxin and this interaction may account for increased susceptibility to digitalis toxicity [1].  Calcium channel blockers such as verapamil or diltiazem that are used to slow heart rate and that may possibly improve ventricular relaxation in diastolic heart failure (eg, in hypertensive heart disease or hypertrophic cardiomyopathy) are ineffective in cardiac amyloidosis, as the diastolic dysfunction is due to the amyloid and not to myocardial cellular dysfunction. Indeed, these drugs are contraindicated, as their negative inotropic effects may be profound, possibly because of an abnormal binding to amyloid fibrils and may depress compensatory heart rate responses to low stroke volume and cardiac output [1-3].   In light of above, would anticipate home on no BP meds and use small dose of lasix for symptomatic dyspnea or fluid retention. If this is amyloid would anticipate outpatient referral to hematology for workup of plasma cell dyscrasia.                 LOS: 2 days    Cassell Clement 10/31/2012, 7:33 AM

## 2012-11-03 ENCOUNTER — Telehealth: Payer: Self-pay | Admitting: *Deleted

## 2012-11-03 LAB — PROTEIN ELECTROPHORESIS, SERUM
M-Spike, %: NOT DETECTED g/dL
Total Protein ELP: 4.6 g/dL — ABNORMAL LOW (ref 6.0–8.3)

## 2012-11-03 NOTE — Telephone Encounter (Signed)
Message copied by Burnell Blanks on Mon Nov 03, 2012  2:57 PM ------      Message from: Cassell Clement      Created: Mon Nov 03, 2012  2:46 PM       Juliette Alcide please tell lab to go ahead and run the serum IFE on the sample they are holding in the lab. 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

## 2012-11-04 LAB — PROTEIN ELECTROPH W RFLX QUANT IMMUNOGLOBULINS
Alpha-1-Globulin: 4.6 % (ref 2.9–4.9)
Alpha-2-Globulin: 16.9 % — ABNORMAL HIGH (ref 7.1–11.8)
M-Spike, %: NOT DETECTED g/dL
Total Protein ELP: 3.9 g/dL — ABNORMAL LOW (ref 6.0–8.3)

## 2012-11-04 LAB — IGG, IGA, IGM
IgA: 147 mg/dL (ref 68–379)
IgG (Immunoglobin G), Serum: 488 mg/dL — ABNORMAL LOW (ref 650–1600)

## 2012-11-05 LAB — UIFE/LIGHT CHAINS/TP QN, 24-HR UR
Albumin, U: DETECTED
Free Kappa Lt Chains,Ur: 1.49 mg/dL (ref 0.14–2.42)
Free Lambda Lt Chains,Ur: 29.6 mg/dL — ABNORMAL HIGH (ref 0.02–0.67)
Gamma Globulin, Urine: DETECTED — AB

## 2012-11-11 ENCOUNTER — Telehealth: Payer: Self-pay | Admitting: Cardiology

## 2012-11-11 DIAGNOSIS — I517 Cardiomegaly: Secondary | ICD-10-CM

## 2012-11-11 NOTE — Telephone Encounter (Signed)
Working on appointments.  Need to fax info to hematology

## 2012-11-11 NOTE — Telephone Encounter (Signed)
Will forward to  Dr. Brackbill  

## 2012-11-11 NOTE — Telephone Encounter (Signed)
New Problem:    Patient called in stating that he was returning Dr. Yevonne Pax call.  Please call back.

## 2012-11-11 NOTE — Telephone Encounter (Signed)
I talked to the patient about his recent results.  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  His urine immunoelectrophoresis showed Bence- Jones protein. We will refer him to [facility] surgery for an abdominal fat pad biopsy for amyloidosis. We will also refer to hematology for evaluation of possible plasma cell dyscrasia with abnormal Bence Jones protein in the urine and cardiac findings on echo and on MRI suspicious for amyloid heart disease. He asked [place] to avoid the dates of December 17 and 18th because he has business meetings on those days.

## 2012-11-12 NOTE — Telephone Encounter (Signed)
Order for referral put in epic (spoke with Tiffany and she helped with putting in referral)

## 2012-11-13 ENCOUNTER — Telehealth: Payer: Self-pay | Admitting: Oncology

## 2012-11-13 NOTE — Telephone Encounter (Signed)
Spoke with patient and advised this looks as though it is still pending authorization from radiologist.  Will call back if he doesn't hear from them soon

## 2012-11-13 NOTE — Telephone Encounter (Signed)
F/U  Have not heard from [place] at [facility]  - biopsy

## 2012-11-13 NOTE — Telephone Encounter (Signed)
S/W PT IN REF TO NP APPT. ON 11/19/12@1 :30 REFERRING DR Patty Sermons DX-[facility] MAILED NP PACKET

## 2012-11-13 NOTE — Telephone Encounter (Signed)
C/D 11/13/12 for appt. 11/19/12

## 2012-11-18 ENCOUNTER — Ambulatory Visit (INDEPENDENT_AMBULATORY_CARE_PROVIDER_SITE_OTHER): Payer: 59 | Admitting: Cardiology

## 2012-11-18 ENCOUNTER — Encounter: Payer: Self-pay | Admitting: Cardiology

## 2012-11-18 VITALS — BP 124/76 | HR 100 | Ht 66.0 in | Wt 177.0 lb

## 2012-11-18 DIAGNOSIS — I5032 Chronic diastolic (congestive) heart failure: Secondary | ICD-10-CM

## 2012-11-18 DIAGNOSIS — R803 Bence Jones proteinuria: Secondary | ICD-10-CM | POA: Insufficient documentation

## 2012-11-18 DIAGNOSIS — I10 Essential (primary) hypertension: Secondary | ICD-10-CM

## 2012-11-18 DIAGNOSIS — Z79899 Other long term (current) drug therapy: Secondary | ICD-10-CM

## 2012-11-18 DIAGNOSIS — R809 Proteinuria, unspecified: Secondary | ICD-10-CM

## 2012-11-18 DIAGNOSIS — R55 Syncope and collapse: Secondary | ICD-10-CM

## 2012-11-18 MED ORDER — FUROSEMIDE 40 MG PO TABS: 40.0000 mg | ORAL_TABLET | Freq: Every day | ORAL | Status: DC

## 2012-11-18 NOTE — Assessment & Plan Note (Signed)
The patient has had no further syncopal episodes

## 2012-11-18 NOTE — Progress Notes (Signed)
Eugene Martinez Date of Birth:  March 19, 1946 [facility] [address] [place], [place]  [address] [phone]         Fax   [phone]  History of Present Illness: This pleasant 66 year old gentleman is seen back for a post hospital office visit.  He was recently hospitalized at [facility] after experiencing exertional syncope wall walking up steps at a conference at [place].  Workup in the hospital revealed significant left ventricular hypertrophy and a speckling of the myocardium on two-dimensional echocardiogram suggestive of possible amyloid heart disease.  Systolic function was normal with ejection fraction of 55-60% in the left atrium was moderately dilated.  The patient had a cardiac MRI looking for amyloid heart disease.  Left ventricular wall motion was normal with an ejection fraction of 66%.  There appeared to be mild right ventricular hypertrophy and normal right atrial size and mild to moderate mitral regurgitation.  When delayed enhancement imaging was attempted, they were unable to null the left ventricular myocardium.  The patient was monitored in the hospital and had no arrhythmias.  His blood pressure remained low normal and he was discharged on no medication for blood pressure but with the instructions to take 40 mg of Lasix as needed if his weight increased or if he had unusual shortness of breath.  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  Further outpatient workup is in progress including a hematology consultation with Dr. Cephas Darby tomorrow and on 11/21/12 the patient will have a abdominal fat pad biopsy by interventional radiology to be stained for amyloid.  Current Outpatient Prescriptions  Medication Sig Dispense Refill  . aspirin EC 81 MG tablet Take 81 mg by mouth every evening.      Marland Kitchen azelastine (ASTELIN) 137 MCG/SPRAY nasal  spray Place 1-2 sprays into the nose every 12 (twelve) hours as needed. Use in each nostril as directed. For allergies      . cholecalciferol (VITAMIN D) 1000 UNITS tablet Take 1,000 Units by mouth daily.      . finasteride (PROSCAR) 5 MG tablet Take 5 mg by mouth daily.      . furosemide (LASIX) 40 MG tablet Take 1 tablet (40 mg total) by mouth daily.  30 tablet  5  . levocetirizine (XYZAL) 5 MG tablet Take 5 mg by mouth at bedtime. To relieve allergy symptoms      . montelukast (SINGULAIR) 10 MG tablet Take 10 mg by mouth at bedtime.      . Multiple Vitamins-Minerals (CENTRUM SILVER ULTRA MENS) TABS Take by mouth. Take one tablet once daily      . Omega-3 Fatty Acids (FISH OIL) 1200 MG CAPS Take 1,200 mg by mouth 2 (two) times daily. Takes 1 in the morning and 1 at night      . omeprazole (PRILOSEC) 20 MG capsule Take 20 mg by mouth daily before breakfast. Takes 30 minutes before breakfast for acid reflux      . Tamsulosin HCl (FLOMAX) 0.4 MG CAPS Take 0.4 mg by mouth daily.      Marland Kitchen triamcinolone (NASACORT) 55 MCG/ACT nasal inhaler Place 1-2 sprays into the nose daily as needed. For allergies      . vitamin E 400 UNIT capsule Take 400 Units by mouth daily.      . [DISCONTINUED] furosemide (LASIX) 20 MG tablet Take 1 tablet (20 mg total) by mouth daily as needed (  daily as needed for weight gain > 2lbs in 1 day or 5lbs over the course of a week, or fluid retention/swelling.).  30 tablet  2    No Known Allergies  Patient Active Problem List  Diagnosis  . Syncope  . Multiple allergies  . Hypertension  . RBBB (right bundle branch block with left anterior fascicular block)  . Proteinuria  . Chronic diastolic heart failure  . Proteinuria, Bence Jones    History  Smoking status  . Never Smoker   Smokeless tobacco  . Never Used    History  Alcohol Use No    No family history on file.  Review of Systems: Constitutional: no fever chills diaphoresis or fatigue or change in weight.    Head and neck: no hearing loss, no epistaxis, no photophobia or visual disturbance. Respiratory: No cough, shortness of breath or wheezing. Cardiovascular: No chest pain peripheral edema, palpitations. Gastrointestinal: No abdominal distention, no abdominal pain, no change in bowel habits hematochezia or melena. Genitourinary: No dysuria, no frequency, no urgency, no nocturia. Musculoskeletal:No arthralgias, no back pain, no gait disturbance or myalgias. Neurological: No dizziness, no headaches, no numbness, no seizures, no syncope, no weakness, no tremors. Hematologic: No lymphadenopathy, no easy bruising. Psychiatric: No confusion, no hallucinations, no sleep disturbance.    Physical Exam: Filed Vitals:   11/18/12 1558  BP: 124/76  Pulse: 100   the general appearance reveals a well-developed well-nourished gentleman in no distress.  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  Extraocular movements are full.  There is no scleral icterus.  The mouth and pharynx are normal.  The neck is supple.  The carotids reveal no bruits.  The jugular venous pressure is lightly elevated.  The  thyroid is not enlarged.  There is no lymphadenopathy.  The chest reveals mild basilar rales  Expansion of the chest is symmetrical.  The precordium is quiet.  The first heart sound is normal.  The second heart sound is physiologically split.  There is soft systolic murmur at the left sternal edge and there is an S3 gallop.  There is no abnormal lift or heave.  The abdomen is soft and nontender.  The bowel sounds are normal.  The liver and spleen are not enlarged.  There are no abdominal masses.  There are no abdominal bruits.  Extremities reveal good pedal pulses.  There is 2+ bilateral ankle edema  There is no cyanosis or clubbing.  Strength is normal and symmetrical in all extremities.  There is no lateralizing weakness.  There are no  sensory deficits.  The skin is warm and dry.  There is no rash.     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  We will have him return in one week for follow up basal metabolic panel to check on potassium and renal function.  He'll return here in one month for followup office visit EKG and basal metabolic panel. In the meantime he will see Dr. Cyndie Chime tomorrow and he will have his abdominal fat pad biopsy for amyloidosis on 11/21/12.

## 2012-11-18 NOTE — Assessment & Plan Note (Signed)
The patient has had exertional dyspnea.  He has been sleeping on 3 pillows because of orthopnea.  He has not been taking Lasix on a regular basis.  He does have 2+ ankle edema today and his blood pressure is satisfactory and we will have him start taking Lasix 40 mg daily.

## 2012-11-18 NOTE — Telephone Encounter (Signed)
Patient here today for visit and both appointments have been scheduled for this week

## 2012-11-18 NOTE — Patient Instructions (Signed)
INCREASE YOUR LASIX TO 40 MG DAILY  Return in 1 week for labs (bmet)  Your physician recommends that you schedule a follow-up appointment in: 1 month ov/ekg/bmet

## 2012-11-18 NOTE — Assessment & Plan Note (Signed)
She has a positive Bence-Jones proteinuria.  Workup for possible multiple myeloma and myeloma heart disease is in progress

## 2012-11-18 NOTE — Assessment & Plan Note (Signed)
The blood pressure was remaining normal on current therapy.

## 2012-11-19 ENCOUNTER — Other Ambulatory Visit: Payer: Self-pay | Admitting: Physician Assistant

## 2012-11-19 ENCOUNTER — Ambulatory Visit (HOSPITAL_BASED_OUTPATIENT_CLINIC_OR_DEPARTMENT_OTHER): Payer: 59

## 2012-11-19 ENCOUNTER — Other Ambulatory Visit (HOSPITAL_BASED_OUTPATIENT_CLINIC_OR_DEPARTMENT_OTHER): Payer: 59 | Admitting: Lab

## 2012-11-19 ENCOUNTER — Encounter: Payer: Self-pay | Admitting: Oncology

## 2012-11-19 ENCOUNTER — Ambulatory Visit (HOSPITAL_COMMUNITY)
Admission: RE | Admit: 2012-11-19 | Discharge: 2012-11-19 | Disposition: A | Discharge status: Home / Self Care | Payer: 59 | Source: Ambulatory Visit | Attending: Oncology | Admitting: Oncology

## 2012-11-19 ENCOUNTER — Ambulatory Visit (HOSPITAL_BASED_OUTPATIENT_CLINIC_OR_DEPARTMENT_OTHER): Payer: 59 | Admitting: Oncology

## 2012-11-19 VITALS — BP 138/72 | HR 106 | Temp 97.6°F | Resp 18 | Ht 66.0 in | Wt 176.2 lb

## 2012-11-19 DIAGNOSIS — R803 Bence Jones proteinuria: Secondary | ICD-10-CM

## 2012-11-19 DIAGNOSIS — Z981 Arthrodesis status: Secondary | ICD-10-CM | POA: Insufficient documentation

## 2012-11-19 DIAGNOSIS — M949 Disorder of cartilage, unspecified: Secondary | ICD-10-CM | POA: Insufficient documentation

## 2012-11-19 DIAGNOSIS — R809 Proteinuria, unspecified: Secondary | ICD-10-CM

## 2012-11-19 DIAGNOSIS — D649 Anemia, unspecified: Secondary | ICD-10-CM | POA: Insufficient documentation

## 2012-11-19 DIAGNOSIS — J90 Pleural effusion, not elsewhere classified: Secondary | ICD-10-CM | POA: Insufficient documentation

## 2012-11-19 DIAGNOSIS — M899 Disorder of bone, unspecified: Secondary | ICD-10-CM | POA: Insufficient documentation

## 2012-11-19 DIAGNOSIS — M5137 Other intervertebral disc degeneration, lumbosacral region: Secondary | ICD-10-CM | POA: Insufficient documentation

## 2012-11-19 DIAGNOSIS — M51379 Other intervertebral disc degeneration, lumbosacral region without mention of lumbar back pain or lower extremity pain: Secondary | ICD-10-CM | POA: Insufficient documentation

## 2012-11-19 DIAGNOSIS — E639 Nutritional deficiency, unspecified: Secondary | ICD-10-CM

## 2012-11-19 DIAGNOSIS — I517 Cardiomegaly: Secondary | ICD-10-CM | POA: Insufficient documentation

## 2012-11-19 DIAGNOSIS — E8589 Other amyloidosis: Secondary | ICD-10-CM

## 2012-11-19 NOTE — Progress Notes (Signed)
Checked in new patient. No financial issues. °

## 2012-11-19 NOTE — Progress Notes (Signed)
New Patient Hematology-Oncology Evaluation   Eugene Martinez [id number] Dec 19, 1945 66 y.o. 11/19/2012  CC: Dr. Cassell Clement; Dr. 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 Over the last few months he has noted the indolent onset of dyspnea on exertion primarily when walking up an incline. He was at a conference at a local hotel a few weeks ago. He walked up 2 flights of stairs. When he got to the top he was extremely short of breath and then had a presyncopal episode. He was brought by ambulance to the hospital. Physical exam showed an initial blood pressure 87/46 with a ulcer at 91. He had an S3 gallop. No murmurs. He had peripheral edema. A cardiogram showed a right bundle branch block pattern. A 2-D echocardiogram done on November 21 showed severe left ventricular hypertrophy with a "speckling" pattern on the myocardium. Estimated left ventricular ejection fraction was 55-60%. Cardiac MRI scan was done on November 22. This showed severe concentric left ventricular hypertrophy and mild right ventricular hypertrophy with estimated ejection fraction of 66%. Presence of diastolic dysfunction is not mentioned on either of these studies. Routine laboratory studies on admission November 21 showed a mild normochromic anemia hemoglobin 12.8, hematocrit 35, MCV 88. BUN 18, creatinine 1.07, serum total protein and albumin both decreased at 4.7 and 2.4 respectively. A urinalysis showed greater than 3+ protein. A serum protein electrophoresis did not show a monoclonal peak. There was an area of slight restriction in the IgA lambda lane on immunofixation electrophoresis. Serum quantitative immunoglobulins with IgG decreased at 488 mg percent, normal IgA 147, decreased IgM 21. A random urine showed significant proteinuria 231 mg percent. Electrophoresis on the urine showed a  nonselective pattern with all of the serum proteins including albumin, sulfa, beta, and gammaglobulin fractions detected. Immunofixation showed lambda free light chains.  He had some mild liver function abnormalities. CT scan of the abdomen and pelvis showed no significant abnormalities. No evidence for hepatosplenomegaly. Small bilateral pleural effusions incidentally noted. His Prinivil was stopped. He was started on Lasix 40 mg daily.   PMH: No history of MI. No asthma. No emphysema. No tuberculosis. No prior history of hepatitis, yellow jaundice, mononucleosis, thyroid disease, blood clots, seizure, stroke. He did have symptomatic renal stones back in the late 70s. He has BPH. He had polio affecting his right leg when he was in his 55s. He has seasonal allergies and takes allergy injections. Past Medical History  Diagnosis Date  . Hypertension   . Syncope     a. 10/2012 Echo: EF 55-60%, severe LVH with speckling of myocardium -? amyloid. Mild MR, mod dil LA.  . Mild mitral regurgitation     a. by echo 10/2012  Previous surgery: Orthopedic surgeon on his right leg which became shorter than his left leg due to polio. Surgery on his right ankle. Pilonidal cyst removal. Circumcision in 1984.Marland Kitchen Cervical disc surgery in 2001..   Allergies: No Known Allergies  Medications: Aspirin 81 mg daily, Lasix 40 mg daily, Azo-Standard 137 mcg nasal spray 1-2 sprays every 12 hours when necessary, vitamins 1 daily, Proscar 5 mg daily, fish or capsules 1200 mg twice a day, Xyzal 5 mg at bedtime, Singulair 10 mg at bedtime, Prilosec 20 mg daily, Flomax 0.4 mg daily, Nasacort 1-2 sprays daily when necessary, vitamin D and vitamin E supplements.    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 Married. 2 healthy daughters age 87 and 37.  reports that he has never smoked. He has never used smokeless tobacco. He reports that he does not drink alcohol or use illicit drugs.  Family  History: Father died at age 37 of complications of heart failure. Mother died age 75 possibly cardiac related. A brother age 47 with diabetes, a sister age 61 and a sister age 2 both healthy.  Review of Systems: Constitutional symptoms: No constitutional symptoms HEENT: No sore throat Respiratory: See above Cardiovascular:  Specifically denies chest pain Gastrointestinal ROS: No dysphagia. No change in bowel habit Genito-Urinary ROS: Occasional urinary frequency Hematological and Lymphatic: Musculoskeletal: No muscle or bone pain Neurologic: No headache or change in vision. No paresthesias. Dermatologic: No rash or ecchymosis. Remaining ROS negative.  Physical Exam: Blood pressure 138/72, pulse 106, temperature 97.6 F (36.4 C), temperature source Oral, resp. rate 18, height 5\' 6"  (1.676 m), weight 176 lb 3.2 oz (79.924 kg). Wt Readings from Last 3 Encounters:  11/19/12 176 lb 3.2 oz (79.924 kg)  11/18/12 177 lb (80.287 kg)  10/29/12 172 lb (78.019 kg)    General appearance: Well-nourished Caucasian man Head: No periorbital purpura. Tongue normal does not appear thickened Neck: Full range of motion Lymph nodes: No adenopathy Breasts: Lungs: Clear to auscultation resonant to percussion Heart: Regular rhythm, 1/6 systolic murmur second right intercostal space, no gallop no rub Abdominal: Soft, nontender, no mass no organomegaly GU: Not examined Extremities: Trace edema, no calf tenderness Neurologic: Mental status intact, PERRLA, optic disc sharp and vessels normal, motor strength 5 over 5, reflexes 2+ symmetric, moderate decrease in vibration sensation to tuning fork exam over the fingertips Skin: No rash or ecchymosis    Lab Results: Lab Results  Component Value Date   WBC 7.9 10/30/2012   HGB 12.5* 10/30/2012   HCT 35.5* 10/30/2012   MCV 88.1 10/30/2012   PLT 153 10/30/2012     Chemistry      Component Value Date/Time   NA 137 10/31/2012 0451   K 4.8 10/31/2012  0451   CL 104 10/31/2012 0451   CO2 28 10/31/2012 0451   BUN 16 10/31/2012 0451   CREATININE 1.04 10/31/2012 0451      Component Value Date/Time   CALCIUM 8.6 10/31/2012 0451   ALKPHOS 46 10/31/2012 0451   AST 23 10/31/2012 0451   ALT 36 10/31/2012 0451   BILITOT 0.8 10/31/2012 0451    BNP was significantly elevated on admission 5284. However a troponin was not elevated at 0.02  Radiological Studies: See discussion above    Impression and Plan: Amyloidosis versus lambda light chain multiple myeloma  Although the atypical findings on echocardiogram are consistent with amyloidosis, absence of diastolic dysfunction and a normal troponin argue against cardiac amyloidosis.  He is scheduled for an abdominal fat pad biopsy this Friday, December 13. I will see if the radiologist can also do a bone marrow aspiration & biopsy. If not, I will do this myself the following week and then see the patient back when all results are available to discuss a treatment plan. I'm going to get a skeletal bone survey today. He also needs a proper 24-hour urine collection for total protein.  He is quite intelligent. I wrote detailed notes and diagrams for him as we talked and I think he has a good understanding of what the possible diagnoses are. I told him we would defer a discussion on specific treatment until we have  a firm diagnosis. I tried to encourage him that we have made some major advances in this field just in the last 10 years and we do have many active treatments available for both myeloma and amyloidosis.      Levert Feinstein, MD 11/19/2012, 4:59 PM

## 2012-11-19 NOTE — Patient Instructions (Signed)
We will contact you if bone marrow can be done Friday or has to wait until Thursday 12/19

## 2012-11-20 ENCOUNTER — Other Ambulatory Visit: Payer: Self-pay | Admitting: Nurse Practitioner

## 2012-11-20 ENCOUNTER — Encounter (HOSPITAL_COMMUNITY): Payer: Self-pay | Admitting: Respiratory Therapy

## 2012-11-20 DIAGNOSIS — R803 Bence Jones proteinuria: Secondary | ICD-10-CM

## 2012-11-21 ENCOUNTER — Telehealth: Payer: Self-pay | Admitting: *Deleted

## 2012-11-21 ENCOUNTER — Ambulatory Visit (HOSPITAL_COMMUNITY)
Admission: RE | Admit: 2012-11-21 | Discharge: 2012-11-21 | Disposition: A | Discharge status: Home / Self Care | Payer: 59 | Source: Ambulatory Visit | Attending: Cardiology | Admitting: Cardiology

## 2012-11-21 ENCOUNTER — Encounter (HOSPITAL_COMMUNITY): Payer: Self-pay

## 2012-11-21 VITALS — BP 112/72 | HR 92 | Temp 98.2°F | Resp 18 | Ht 66.0 in | Wt 169.0 lb

## 2012-11-21 DIAGNOSIS — I517 Cardiomegaly: Secondary | ICD-10-CM

## 2012-11-21 LAB — PROTIME-INR: Prothrombin Time: 13.7 seconds (ref 11.6–15.2)

## 2012-11-21 LAB — CBC
Hemoglobin: 12.7 g/dL — ABNORMAL LOW (ref 13.0–17.0)
MCH: 31 pg (ref 26.0–34.0)
MCV: 87.6 fL (ref 78.0–100.0)
RBC: 4.1 MIL/uL — ABNORMAL LOW (ref 4.22–5.81)
WBC: 7.7 10*3/uL (ref 4.0–10.5)

## 2012-11-21 MED ORDER — FENTANYL CITRATE 0.05 MG/ML IJ SOLN
INTRAMUSCULAR | Status: AC
  Filled 2012-11-21: qty 4

## 2012-11-21 MED ORDER — SODIUM CHLORIDE 0.9 % IV SOLN: INTRAVENOUS | Status: DC

## 2012-11-21 MED ORDER — MIDAZOLAM HCL 2 MG/2ML IJ SOLN
INTRAMUSCULAR | Status: AC
  Filled 2012-11-21: qty 4

## 2012-11-21 NOTE — H&P (Signed)
Eugene Martinez is an 66 y.o. male.   Chief Complaint: SOB while climbing stairs 4 weeks ago while at a work conference;  Hospitalized for 3 days - sxs resolved Work up reveals possible cardiac amyloidosis per chart Scheduled now for fat pad biopsy HPI: HTN; near syncopal episode  Past Medical History  Diagnosis Date  . Hypertension   . Syncope     a. 10/2012 Echo: EF 55-60%, severe LVH with speckling of myocardium -? amyloid. Mild MR, mod dil LA.  . Mild mitral regurgitation     a. by echo 10/2012    No past surgical history on file.  No family history on file. Social History:  reports that he has never smoked. He has never used smokeless tobacco. He reports that he does not drink alcohol or use illicit drugs.  Allergies: No Known Allergies   (Not in a hospital admission)  Results for orders placed during the hospital encounter of 11/21/12 (from the past 48 hour(s))  CBC     Status: Abnormal   Collection Time   11/21/12  9:13 AM      Component Value Range Comment   WBC 7.7  4.0 - 10.5 K/uL    RBC 4.10 (*) 4.22 - 5.81 MIL/uL    Hemoglobin 12.7 (*) 13.0 - 17.0 g/dL    HCT 16.1 (*) 09.6 - 52.0 %    MCV 87.6  78.0 - 100.0 fL    MCH 31.0  26.0 - 34.0 pg    MCHC 35.4  30.0 - 36.0 g/dL    RDW 04.5  40.9 - 81.1 %    Platelets 182  150 - 400 K/uL    Dg Bone Survey Met  11/19/2012  *RADIOLOGY REPORT*  Clinical Data: Light chain  proteinuria, mild anemia, evaluate for multiple myeloma  METASTATIC BONE SURVEY  Comparison: None.  Findings: A lateral view of the skull shows no radiolucent lesion. The sella turcica appears normal.  Views of the cervical vertebrae show anterior fusion from C4-C7 with normal alignment.  The fusion appears solid.  The thoracic vertebrae are in normal alignment.  No compression deformity is seen.  Mild degenerative change is noted in the lower thoracic spine.  The lumbar vertebrae are in normal alignment.  There is degenerative disc disease at L5-S1.  No  compression deformity is seen.  A view of the pelvis shows a few questionable radiolucent areas within the left iliac bone and possibly the left femoral neck.  The left femoral neck radiolucent lesion is corticated and most likely represents a benign bony lesion.  Views of both femurs show no radiolucent lesions.  Views of the right shoulder and humerus do show a probable radiolucent lesion within the right humeral head at the junction of the neck.  There are also questionable lesions within the superior aspect of the right scapula.  Views of the left shoulder and humerus show a possible radiolucent lesion in the superior aspect of the scapula.  The irregularity of the humeral head may be due to chronic rotator cuff disease.  Views of both femurs distally show no radiolucent lesion.  There is degenerative change particularly the left knee.  A single chest x-ray does show blunting of both costophrenic angles consistent with small effusions with basilar atelectasis.  No pneumonia is seen.  The heart is mildly enlarged.  IMPRESSION:  1.  There are a few radiolucent lesions within the scapula bilaterally and possibly within the bones of the pelvis as described above  and early myelomatous lesions cannot be excluded. 2.  No compression deformity of the cervical, thoracic, or lumbar spine.  3.  Small bilateral pleural effusions.  Cardiomegaly. 4.  Degenerative disc disease at L5-S1. 5.  Anterior fusion from C4-C7.   Original Report Authenticated By: Dwyane Dee, M.D.     Review of Systems  Constitutional: Negative for fever.  Respiratory: Positive for shortness of breath.        Resolved  Cardiovascular: Negative for chest pain.  Gastrointestinal: Negative for nausea, vomiting and abdominal pain.  Neurological: Negative for headaches.    Blood pressure 130/77, pulse 100, temperature 98.2 F (36.8 C), temperature source Oral, resp. rate 18, height 5\' 6"  (1.676 m), weight 169 lb (76.658 kg), SpO2  97.00%. Physical Exam  Constitutional: He is oriented to person, place, and time. He appears well-developed and well-nourished.  Cardiovascular: Normal rate, regular rhythm and normal heart sounds.   No murmur heard. Respiratory: Effort normal and breath sounds normal. He has no wheezes.  GI: Soft. Bowel sounds are normal. There is no tenderness.  Musculoskeletal: Normal range of motion.  Neurological: He is alert and oriented to person, place, and time.  Psychiatric: He has a normal mood and affect. His behavior is normal. Judgment and thought content normal.     Assessment/Plan Near syncopal episode and SOB 1 month ago - resolved Hospitalization and work up possible cardiac amyloidosis Scheduled for fat pad biopsy Pt aware of procedure benefits and risks and agreeable to proceed Consent signed and in chart  Halo Laski A 11/21/2012, 9:52 AM

## 2012-11-21 NOTE — ED Notes (Signed)
No sedation per MD, pt agreeable.

## 2012-11-21 NOTE — H&P (Signed)
Agree with PA note.  Will attempt fat pad biopsy.  Signed,  Sterling Big, MD Vascular & Interventional Radiologist [facility] Radiology

## 2012-11-21 NOTE — Telephone Encounter (Signed)
Radiology has it's first opening at 9:00 am on 11-28-2012 for a Bone Marrow Bx.  Eugene Martinez has tentatively scheduled this date.  Would like a call from Ms. Maisie Fus to know if she wants this date.  Eugene Martinez can be reached at [phone].  Patient would need to arrive at 7:30 am for a 9:00 am biopsy.

## 2012-11-21 NOTE — ED Notes (Signed)
Awaiting coags, pt aware.

## 2012-11-21 NOTE — Procedures (Signed)
Interventional Radiology Procedure Note  Procedure: Successful US guided bx of anterior abdominal fat pad.  Multiple 18G cores taken, each core only fragments of fatty tissue.  Complications: None Recommendations: - 30 minutes observation - Pathology pending  Signed,  Sterling Big, MD Vascular & Interventional Radiologist [facility] Radiology

## 2012-11-25 ENCOUNTER — Telehealth: Payer: Self-pay | Admitting: *Deleted

## 2012-11-25 NOTE — Telephone Encounter (Signed)
Advised wife who was with him at his appointment

## 2012-11-25 NOTE — Telephone Encounter (Signed)
Message copied by Burnell Blanks on Tue Nov 25, 2012  5:37 PM ------      Message from: Cassell Clement      Created: Mon Nov 24, 2012  8:23 PM       Please report.  The abdominal fat pad biopsy was negative for amyloidosis.  This is good news.  Now we will await the results from the bone marrow exam that Dr. Cyndie Chime has planned.

## 2012-11-25 NOTE — Telephone Encounter (Signed)
Pt. Called with questions about his bone marrow bx appt.   Let him know that the 12/20 hospital appt. Was cancelled, however Dr. Cyndie Chime is going to do bx here at [facility] on Thursday 12/19.  He is to be here at 8am for lab first.  Patient asked if he should bring a driver...would be a good idea...just in case he needs one.

## 2012-11-26 ENCOUNTER — Other Ambulatory Visit (INDEPENDENT_AMBULATORY_CARE_PROVIDER_SITE_OTHER): Payer: 59

## 2012-11-26 ENCOUNTER — Telehealth: Payer: Self-pay | Admitting: Oncology

## 2012-11-26 DIAGNOSIS — Z79899 Other long term (current) drug therapy: Secondary | ICD-10-CM

## 2012-11-26 LAB — BASIC METABOLIC PANEL
BUN: 22 mg/dL (ref 6–23)
Chloride: 102 mEq/L (ref 96–112)
Potassium: 4.3 mEq/L (ref 3.5–5.1)
Sodium: 136 mEq/L (ref 135–145)

## 2012-11-26 NOTE — Telephone Encounter (Signed)
Added f/u for 12/31 @ 4pm. D/t per 12/11 pof. Pt also on schedule for 12/19. This appt was confirmed and pt made aware 12/31 is in addition to 12/19. Pt to get new schedule when he comes in tomorrow.

## 2012-11-26 NOTE — Telephone Encounter (Signed)
Add to previous message vm was left for pt re add on f/u for 12/31 and 12/19 appt was also confirmed in message. Pt to get new schedule when he comes in tomorrow.

## 2012-11-27 ENCOUNTER — Telehealth: Payer: Self-pay | Admitting: *Deleted

## 2012-11-27 ENCOUNTER — Ambulatory Visit (HOSPITAL_BASED_OUTPATIENT_CLINIC_OR_DEPARTMENT_OTHER): Payer: 59 | Admitting: Oncology

## 2012-11-27 ENCOUNTER — Other Ambulatory Visit (HOSPITAL_COMMUNITY)
Admission: RE | Admit: 2012-11-27 | Discharge: 2012-11-27 | Disposition: A | Discharge status: Home / Self Care | Payer: 59 | Source: Ambulatory Visit | Attending: Oncology | Admitting: Oncology

## 2012-11-27 ENCOUNTER — Encounter: Payer: Self-pay | Admitting: *Deleted

## 2012-11-27 ENCOUNTER — Other Ambulatory Visit: Payer: 59 | Admitting: Lab

## 2012-11-27 VITALS — BP 109/70 | HR 89 | Temp 96.9°F

## 2012-11-27 DIAGNOSIS — R803 Bence Jones proteinuria: Secondary | ICD-10-CM

## 2012-11-27 DIAGNOSIS — D472 Monoclonal gammopathy: Secondary | ICD-10-CM | POA: Insufficient documentation

## 2012-11-27 DIAGNOSIS — D649 Anemia, unspecified: Secondary | ICD-10-CM

## 2012-11-27 LAB — CBC
HCT: 36.1 % — ABNORMAL LOW (ref 39.0–52.0)
Hemoglobin: 12.7 g/dL — ABNORMAL LOW (ref 13.0–17.0)
MCV: 88 fL (ref 78.0–100.0)
RBC: 4.1 MIL/uL — ABNORMAL LOW (ref 4.22–5.81)
RDW: 13.2 % (ref 11.5–15.5)
WBC: 7 10*3/uL (ref 4.0–10.5)

## 2012-11-27 LAB — DIFFERENTIAL
Eosinophils Relative: 2 % (ref 0–5)
Lymphocytes Relative: 25 % (ref 12–46)
Lymphs Abs: 1.8 10*3/uL (ref 0.7–4.0)
Monocytes Absolute: 0.9 10*3/uL (ref 0.1–1.0)
Monocytes Relative: 13 % — ABNORMAL HIGH (ref 3–12)
Neutro Abs: 4.1 10*3/uL (ref 1.7–7.7)

## 2012-11-27 NOTE — Telephone Encounter (Signed)
Message copied by Burnell Blanks on Thu Nov 27, 2012  6:18 PM ------      Message from: Cassell Clement      Created: Wed Nov 26, 2012  1:21 PM       Please report.  Potassium and kidney function remained normal on furosemide.  Continue same medication

## 2012-11-27 NOTE — Progress Notes (Signed)
Bone Marrow Biopsy and Aspiration Procedure Note   Informed consent was obtained and potential risks including bleeding, infection and pain were reviewed with the patient.  Posterior iliac crest(s) prepped with Betadine.  Lidocaine 2% local anesthesia infiltrated into the subcutaneous tissue. Premedication: none  Left bone marrow biopsy and  aspirate was obtained.   The procedure was tolerated well and there were no complications.  Specimens sent for: routine histopathologic stains and sectioning,and cytogenetics. Amyloid stains requested  Indication: kappa light chain proteinuria rule out myeloma vs amyloidosis  Physician: Levert Feinstein

## 2012-11-27 NOTE — Telephone Encounter (Signed)
Advised patient of lab results  

## 2012-11-27 NOTE — Progress Notes (Signed)
0945-Pt discharged to home s/p BM biopsy to left hip from Dr. Cyndie Chime.  Pressure dressing clean, dry and intact.  VS stable.  Pt.'s wife at bedside.  Discharge instructions reviewed with patient. Pt has no questions at this time.

## 2012-11-27 NOTE — Patient Instructions (Addendum)
Bone Marrow Aspiration, Bone Marrow Biopsy  Care After  Read the instructions outlined below and refer to this sheet in the next few weeks. These discharge instructions provide you with general information on caring for yourself after you leave the hospital. Your caregiver may also give you specific instructions. While your treatment has been planned according to the most current medical practices available, unavoidable complications occasionally occur. If you have any problems or questions after discharge, call your caregiver.  FINDING OUT THE RESULTS OF YOUR TEST  Not all test results are available during your visit. If your test results are not back during the visit, make an appointment with your caregiver to find out the results. Do not assume everything is normal if you have not heard from your caregiver or the medical facility. It is important for you to follow up on all of your test results.   HOME CARE INSTRUCTIONS   You have had sedation and may be sleepy or dizzy. Your thinking may not be as clear as usual. For the next 24 hours:   Only take over-the-counter or prescription medicines for pain, discomfort, and or fever as directed by your caregiver.   Do not drink alcohol.   Do not smoke.   Do not drive.   Do not make important legal decisions.   Do not operate heavy machinery.   Do not care for small children by yourself.   Keep your dressing clean and dry. You may replace dressing with a bandage after 24 hours.   You may take a bath or shower after 24 hours.   Use an ice pack for 20 minutes every 2 hours while awake for pain as needed.  SEEK MEDICAL CARE IF:    There is redness, swelling, or increasing pain at the biopsy site.   There is pus coming from the biopsy site.   There is drainage from a biopsy site lasting longer than one day.   An unexplained oral temperature above 102 F (38.9 C) develops.  SEEK IMMEDIATE MEDICAL CARE IF:    You develop a rash.   You have difficulty  breathing.   You develop any reaction or side effects to medications given.  Document Released: 06/15/2005 Document Revised: 02/18/2012 Document Reviewed: 11/23/2008  ExitCare Patient Information 2013 [employer].

## 2012-11-28 ENCOUNTER — Other Ambulatory Visit (HOSPITAL_COMMUNITY): Payer: 59

## 2012-12-08 NOTE — Telephone Encounter (Signed)
All labs done and reported by  Dr. Patty Sermons

## 2012-12-09 ENCOUNTER — Ambulatory Visit (HOSPITAL_BASED_OUTPATIENT_CLINIC_OR_DEPARTMENT_OTHER): Payer: 59 | Admitting: Oncology

## 2012-12-09 VITALS — BP 125/74 | HR 104 | Temp 97.9°F | Resp 20 | Ht 66.0 in | Wt 166.9 lb

## 2012-12-09 DIAGNOSIS — R803 Bence Jones proteinuria: Secondary | ICD-10-CM

## 2012-12-09 DIAGNOSIS — C9000 Multiple myeloma not having achieved remission: Secondary | ICD-10-CM

## 2012-12-09 DIAGNOSIS — R809 Proteinuria, unspecified: Secondary | ICD-10-CM

## 2012-12-09 NOTE — Patient Instructions (Signed)
Collect urine start after first void on Sunday - collect all day and night and first urine on Monday morning then bring to cancer center.  Will also get a blood sample that day. More blood samples on Feb 4th MD visit on Feb 10th at 4 PM

## 2012-12-10 NOTE — Progress Notes (Signed)
Hematology and Oncology Follow Up Visit  Eugene Martinez [id number] 1946/05/06 67 y.o. 12/10/2012 12:32 PM   Principle Diagnosis: Encounter Diagnosis  Name Primary?  . 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 He was initially admitted to the hospital on November 20 to evaluate dyspnea on exertion, hypertension, and a presyncopal episode. He previously had treated hypertension. Cardiac evaluation now showed severe concentric left ventricular hypertrophy with a preserved ejection fraction. He had an elevated BNP but no elevation of troponin.  He was noted to have a mild normochromic anemia with hemoglobin 12.8, decreased serum total protein and albumin, and 3+ protein on urinalysis. Serum immunoglobulin levels were all suppressed. No M spike detected on serum protein electrophoresis. Random urine showed 231 mg percent of protein with a non-selective pattern on protein electrophoresis and presence of lambda free light chains on immunofixation electrophoresis. BUN and creatinine normal at 22 and 1.0 respectively on December 18 . Some of the cardiac studies suggested that he had amyloidosis. Further evaluation was continued as an outpatient.  A skeletal bone survey done December 11 showed changes from a previous C4-C7 anterior fusion procedure.There were a few radiolucent lesions within the scapula bilaterally and questionable small lesions in the pelvis but no real diagnostic lesions for myeloma. An abdominal fat pad biopsy was done and was negative for amyloid. I did a bone marrow aspiration and biopsy on December 19. This showed only 5% plasma cells in small clusters but no large sheets of plasma cells. 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  Additional laboratory studies include serum free light chain analysis which shows lambda free light chains 16 mg percent on 11/19/2012, kappa free light chains 1.15, ratio 0.07. Beta-2 microglobulin: Pending    Medications: reviewed  Allergies:  Allergies  Allergen Reactions  . Other Other (See Comments)    Seasonal allergies/grass/trees/dust causes watery eyes, stuffiness, sometimes swelling.     Physical Exam: The patient was not examined today Blood pressure 125/74, pulse 104, temperature 97.9 F (36.6 C), temperature source Oral, resp. rate 20, height 5\' 6"  (1.676 m), weight 166 lb 14.4 oz (75.705 kg). Wt Readings from Last 3 Encounters:  12/09/12 166 lb 14.4 oz (75.705 kg)  11/21/12 169 lb (76.658 kg)  11/19/12 176 lb 3.2 oz (79.924 kg)     Lab Results: Lab Results  Component Value Date   WBC 7.0 11/27/2012   HGB 12.7* 11/27/2012   HCT 36.1* 11/27/2012   MCV 88.0 11/27/2012   PLT 180 11/27/2012     Chemistry      Component Value Date/Time   NA 136 11/26/2012 0816   K 4.3 11/26/2012 0816   CL 102 11/26/2012 0816   CO2 27 11/26/2012 0816   BUN 22 11/26/2012 0816   CREATININE 1.0 11/26/2012 0816      Component Value Date/Time   CALCIUM 8.6 11/26/2012 0816   ALKPHOS 46 10/31/2012 0451   AST 23 10/31/2012 0451   ALT 36 10/31/2012 0451   BILITOT 0.8 10/31/2012 0451       Radiological Studies: US Biopsy  11/21/2012  *RADIOLOGY REPORT*  ULTRASOUND-GUIDED CORE BIOPSY  Date: 11/21/2012  Clinical History: 67 year old male with a history of syncope, and cardiac MRI findings concerning for possible amyloid doses. Abdominal fat  pad core biopsy requested for further evaluation.  Procedures Performed: 1. Ultrasound-guided core biopsy of anterior abdominal wall fat pad  Interventional Radiologist:  Sterling Big, MD  PROCEDURE/FINDINGS:   Informed consent was obtained from the patient following explanation of the procedure, risks, benefits and  alternatives. The patient understands, agrees and consents for the procedure. All questions were addressed. A time out was performed.  Maximal barrier sterile technique utilized including caps, mask, sterile gowns, sterile gloves, large sterile drape, hand hygiene, and betadine skin prep.  The abdominal fat pad superior to the umbilicus was interrogated with ultrasound.  No significant vessels are identified in this region.  Local anesthesia was achieved by infiltration of 1% lidocaine.  A small dermatotomy was made with an #11 blade.  Under direct sonographic guidance, five 18 gauge core biopsies were obtained with the Bard automated biopsy device.  The core biopsy fragments were placed in formalin and sent to pathology for further analysis.  There was no significant bleeding.  The patient tolerated the procedure well, no complications.  IMPRESSION:  Technically successful ultrasound guided core biopsy of anterior abdominal wall fat pad.  Signed,  Sterling Big, MD Vascular & Interventional Radiologist [facility]   Original Report Authenticated By: Malachy Moan, M.D.    Dg Bone Survey Met  11/19/2012  *RADIOLOGY REPORT*  Clinical Data: Light chain  proteinuria, mild anemia, evaluate for multiple myeloma  METASTATIC BONE SURVEY  Comparison: None.  Findings: A lateral view of the skull shows no radiolucent lesion. The sella turcica appears normal.  Views of the cervical vertebrae show anterior fusion from C4-C7 with normal alignment.  The fusion appears solid.  The thoracic vertebrae are in normal alignment.  No compression deformity is seen.  Mild degenerative change is noted in the lower thoracic spine.  The lumbar vertebrae are in normal alignment.  There is degenerative disc disease at L5-S1.  No compression deformity is seen.  A view of the pelvis shows a few questionable radiolucent areas within the left iliac bone and possibly the left femoral neck.  The left femoral neck radiolucent  lesion is corticated and most likely represents a benign bony lesion.  Views of both femurs show no radiolucent lesions.  Views of the right shoulder and humerus do show a probable radiolucent lesion within the right humeral head at the junction of the neck.  There are also questionable lesions within the superior aspect of the right scapula.  Views of the left shoulder and humerus show a possible radiolucent lesion in the superior aspect of the scapula.  The irregularity of the humeral head may be due to chronic rotator cuff disease.  Views of both femurs distally show no radiolucent lesion.  There is degenerative change particularly the left knee.  A single chest x-ray does show blunting of both costophrenic angles consistent with small effusions with basilar atelectasis.  No pneumonia is seen.  The heart is mildly enlarged.  IMPRESSION:  1.  There are a few radiolucent lesions within the scapula bilaterally and possibly within the bones of the pelvis as described above and early myelomatous lesions cannot be excluded. 2.  No compression deformity of the cervical, thoracic, or lumbar spine.  3.  Small bilateral pleural effusions.  Cardiomegaly. 4.  Degenerative disc disease at L5-S1. 5.  Anterior fusion from C4-C7.   Original Report Authenticated By: Dwyane Dee, M.D.     Impression and Plan: At this point the aggregate data is most consistent with a early/smoldering  lambda light chain myeloma and not amyloidosis. He does not currently meet the diagnostic criteria for myeloma. I think it is important to know the extent of his proteinuria. I didn't realize that he only had a spot urine analysis for protein. I am going to go ahead and get a 24-hour urine for total protein, immunofixation electrophoresis, and creatinine clearance. If urine light chains exceed 1 g, I would initiate treatment.; Otherwise, I will follow him closely. We may need to consider a kidney biopsy. Although the fat pad biopsy and the  bone marrow biopsy stained negative for amyloid, nonselective nature of his proteinuria suggests glomerular rather than tubular pathology which would be more consistent with amyloid. At this point it might be somewhat academic since the treatment for amyloidosis and light chain myeloma is converging.  100% of this encounter was face-to-face discussion with the patient and his wife.   CC:. Dr. Cassell Clement; Dr. Purvis Sheffield, MD 1/1/201412:32 PM

## 2012-12-11 ENCOUNTER — Other Ambulatory Visit: Payer: Self-pay | Admitting: *Deleted

## 2012-12-11 DIAGNOSIS — R803 Bence Jones proteinuria: Secondary | ICD-10-CM

## 2012-12-11 DIAGNOSIS — R55 Syncope and collapse: Secondary | ICD-10-CM

## 2012-12-11 DIAGNOSIS — I10 Essential (primary) hypertension: Secondary | ICD-10-CM

## 2012-12-12 ENCOUNTER — Telehealth: Payer: Self-pay | Admitting: Oncology

## 2012-12-12 NOTE — Telephone Encounter (Signed)
Called pt left message regarding lab on 12/15/12 [phone]

## 2012-12-15 ENCOUNTER — Other Ambulatory Visit (HOSPITAL_BASED_OUTPATIENT_CLINIC_OR_DEPARTMENT_OTHER): Payer: 59 | Admitting: Lab

## 2012-12-15 DIAGNOSIS — R803 Bence Jones proteinuria: Secondary | ICD-10-CM

## 2012-12-15 DIAGNOSIS — R809 Proteinuria, unspecified: Secondary | ICD-10-CM

## 2012-12-15 DIAGNOSIS — R55 Syncope and collapse: Secondary | ICD-10-CM

## 2012-12-15 DIAGNOSIS — C9000 Multiple myeloma not having achieved remission: Secondary | ICD-10-CM

## 2012-12-15 DIAGNOSIS — I10 Essential (primary) hypertension: Secondary | ICD-10-CM

## 2012-12-15 LAB — BASIC METABOLIC PANEL (CC13)
CO2: 24 mEq/L (ref 22–29)
Calcium: 8.8 mg/dL (ref 8.4–10.4)
Chloride: 107 mEq/L (ref 98–107)
Creatinine: 0.9 mg/dL (ref 0.7–1.3)
Glucose: 130 mg/dl — ABNORMAL HIGH (ref 70–99)

## 2012-12-16 LAB — BETA 2 MICROGLOBULIN, SERUM: Beta-2 Microglobulin: 2.09 mg/L — ABNORMAL HIGH (ref 1.01–1.73)

## 2012-12-17 LAB — UIFE/LIGHT CHAINS/TP QN, 24-HR UR
Beta, Urine: DETECTED — AB
Free Kappa Lt Chains,Ur: 2.99 mg/dL — ABNORMAL HIGH (ref 0.14–2.42)
Free Lambda Excretion/Day: 937.5 mg/d
Free Lambda Lt Chains,Ur: 62.5 mg/dL — ABNORMAL HIGH (ref 0.02–0.67)
Gamma Globulin, Urine: DETECTED — AB
Time: 24 hours
Volume, Urine: 1500 mL

## 2012-12-17 LAB — CREATININE CLEARANCE, URINE, 24 HOUR
Collection Interval-CRCL: 24 hours
Creatinine Clearance: 114 mL/min (ref 75–125)
Creatinine, 24H Ur: 1529 mg/d (ref 800–2000)
Creatinine, Urine: 101.9 mg/dL

## 2012-12-23 ENCOUNTER — Telehealth: Payer: Self-pay | Admitting: *Deleted

## 2012-12-23 ENCOUNTER — Ambulatory Visit (INDEPENDENT_AMBULATORY_CARE_PROVIDER_SITE_OTHER): Payer: 59 | Admitting: Cardiology

## 2012-12-23 ENCOUNTER — Encounter: Payer: Self-pay | Admitting: Cardiology

## 2012-12-23 ENCOUNTER — Other Ambulatory Visit (INDEPENDENT_AMBULATORY_CARE_PROVIDER_SITE_OTHER): Payer: 59

## 2012-12-23 VITALS — BP 136/62 | HR 70 | Resp 18 | Ht 66.0 in | Wt 162.0 lb

## 2012-12-23 DIAGNOSIS — R0989 Other specified symptoms and signs involving the circulatory and respiratory systems: Secondary | ICD-10-CM

## 2012-12-23 DIAGNOSIS — I10 Essential (primary) hypertension: Secondary | ICD-10-CM

## 2012-12-23 DIAGNOSIS — R634 Abnormal weight loss: Secondary | ICD-10-CM | POA: Insufficient documentation

## 2012-12-23 DIAGNOSIS — Z79899 Other long term (current) drug therapy: Secondary | ICD-10-CM

## 2012-12-23 DIAGNOSIS — I5032 Chronic diastolic (congestive) heart failure: Secondary | ICD-10-CM

## 2012-12-23 LAB — BASIC METABOLIC PANEL
BUN: 23 mg/dL (ref 6–23)
Chloride: 104 mEq/L (ref 96–112)
Glucose, Bld: 105 mg/dL — ABNORMAL HIGH (ref 70–99)
Potassium: 3.4 mEq/L — ABNORMAL LOW (ref 3.5–5.1)

## 2012-12-23 NOTE — Telephone Encounter (Signed)
Pam with [facility] called to say that Dr. Cyndie Chime had referred patient (in conversation with Dr. Arlyn Leak) this am..  Patient is scheduled to see Dr. Arrie Aran on 01/02/13. 11am.  They still need demographics and insurance card faxed to [phone].  Will forward message to Tiffany in Medical Records to have this information faxed.

## 2012-12-23 NOTE — Assessment & Plan Note (Signed)
The patient has been feeling well since last visit.  He has not been experiencing any significant dyspnea.  She's had no further syncopal episodes.  He denies any chest pain.

## 2012-12-23 NOTE — Progress Notes (Signed)
Cloyde Reams Date of Birth:  Oct 21, 1946 [facility] [address] [place], [place]  [address] [phone]         Fax   [phone]  History of Present Illness: This pleasant 67 year old gentleman is seen back for a post hospital office visit. He was recently hospitalized at [facility] after experiencing exertional syncope wall walking up steps at a conference at [place]. Workup in the hospital revealed significant left ventricular hypertrophy and a speckling of the myocardium on two-dimensional echocardiogram suggestive of possible amyloid heart disease. Systolic function was normal with ejection fraction of 55-60% in the left atrium was moderately dilated. The patient had a cardiac MRI looking for amyloid heart disease. Left ventricular wall motion was normal with an ejection fraction of 66%. There appeared to be mild right ventricular hypertrophy and normal right atrial size and mild to moderate mitral regurgitation. When delayed enhancement imaging was attempted, they were unable to null the left ventricular myocardium. The patient was monitored in the hospital and had no arrhythmias. His blood pressure remained low normal and he was discharged on no medication for blood pressure but with the instructions to take 40 mg of Lasix as needed if his weight increased or if he had unusual shortness of breath. 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 Further outpatient workup is in progress including a hematology consultation with Dr. Cephas Darby.  Since last visit fat pad biopsy did not show evidence of myeloma and his bone marrow examination did not show evidence of myeloma.   Current Outpatient Prescriptions  Medication Sig Dispense Refill  . aspirin EC 81 MG tablet Take 81 mg by mouth every evening.      Marland Kitchen azelastine (ASTELIN) 137 MCG/SPRAY nasal spray  Place 1-2 sprays into the nose every 12 (twelve) hours as needed. Use in each nostril as directed. For allergies      . cholecalciferol (VITAMIN D) 1000 UNITS tablet Take 1,000 Units by mouth daily.      . finasteride (PROSCAR) 5 MG tablet Take 5 mg by mouth daily.      . furosemide (LASIX) 40 MG tablet Take 40 mg by mouth as directed. 1/2 tablet daily      . levocetirizine (XYZAL) 5 MG tablet Take 5 mg by mouth at bedtime. To relieve allergy symptoms      . montelukast (SINGULAIR) 10 MG tablet Take 10 mg by mouth at bedtime.      . Multiple Vitamins-Minerals (CENTRUM SILVER ULTRA MENS) TABS Take by mouth. Take one tablet once daily      . Omega-3 Fatty Acids (FISH OIL) 1200 MG CAPS Take 1,200 mg by mouth 2 (two) times daily. Takes 1 in the morning and 1 at night      . omeprazole (PRILOSEC) 20 MG capsule Take 20 mg by mouth daily before breakfast. Takes 30 minutes before breakfast for acid reflux      . Tamsulosin HCl (FLOMAX) 0.4 MG CAPS Take 0.4 mg by mouth daily.      Marland Kitchen triamcinolone (NASACORT) 55 MCG/ACT nasal inhaler Place 1-2 sprays into the nose daily as needed. For allergies      . vitamin E 400 UNIT capsule Take 400 Units by mouth daily.      . penicillin v potassium (VEETID) 500 MG tablet Take 500 mg by mouth daily.         Allergies  Allergen  Reactions  . Other Other (See Comments)    Seasonal allergies/grass/trees/dust causes watery eyes, stuffiness, sometimes swelling.    Patient Active Problem List  Diagnosis  . Syncope  . Multiple allergies  . Hypertension  . RBBB (right bundle branch block with left anterior fascicular block)  . Proteinuria  . Chronic diastolic heart failure  . Proteinuria, Bence Jones    History  Smoking status  . Never Smoker   Smokeless tobacco  . Never Used    History  Alcohol Use No    No family history on file.  Review of Systems: Constitutional: no fever chills diaphoresis or fatigue or change in weight.  Head and neck: no  hearing loss, no epistaxis, no photophobia or visual disturbance. Respiratory: No cough, shortness of breath or wheezing. Cardiovascular: No chest pain peripheral edema, palpitations. Gastrointestinal: No abdominal distention, no abdominal pain, no change in bowel habits hematochezia or melena. Genitourinary: No dysuria, no frequency, no urgency, no nocturia. Musculoskeletal:No arthralgias, no back pain, no gait disturbance or myalgias. Neurological: No dizziness, no headaches, no numbness, no seizures, no syncope, no weakness, no tremors. Hematologic: No lymphadenopathy, no easy bruising. Psychiatric: No confusion, no hallucinations, no sleep disturbance.    Physical Exam: Filed Vitals:   12/23/12 0846  BP: 136/62  Pulse: 70  Resp: 18   the general appearance reveals a well-developed well-nourished pleasant gentleman in no distress.The head and neck exam reveals pupils equal and reactive.  Extraocular movements are full.  There is no scleral icterus.  The mouth and pharynx are normal.  The neck is supple.  The carotids reveal no bruits.  The jugular venous pressure is normal.  The  thyroid is not enlarged.  There is no lymphadenopathy.  The chest is clear to percussion and auscultation.  There are no rales or rhonchi.  Expansion of the chest is symmetrical.  The precordium is quiet.  The first heart sound is normal.  The second heart sound is physiologically split.  There is no murmur gallop rub or click.  There is no abnormal lift or heave.  The abdomen is soft and nontender.  The bowel sounds are normal.  The liver and spleen are not enlarged.  There are no abdominal masses.  There are no abdominal bruits.  Extremities reveal good pedal pulses.  There is no phlebitis or edema.  There is no cyanosis or clubbing.  Strength is normal and symmetrical in all extremities.  There is no lateralizing weakness.  There are no sensory deficits.  The skin is warm and dry.  There is no  rash.     Assessment / Plan: Continue same medication except for reduction in Lasix to 20 mg daily.  Recheck in 2 months for followup office visit EKG and basal metabolic panel

## 2012-12-23 NOTE — Assessment & Plan Note (Signed)
Since last visit the patient has lost 15 more pounds.  He is not trying to lose weight.  He wonders whether the Lasix 40 mg a day is a contributing factor.  We will reduce his furosemide now down to just 20 mg a day.  His appetite is good and he hopes to stabilize his weight

## 2012-12-23 NOTE — Assessment & Plan Note (Signed)
Blood pressure was remaining stable and we will reduce his furosemide to 20 mg daily

## 2012-12-23 NOTE — Patient Instructions (Signed)
Decrease your Lasix to 20 mg daily  Your physician recommends that you schedule a follow-up appointment in: 2 month ov/ekg/bmet  Will obtain labs today and call you with the results (bmet)

## 2012-12-24 ENCOUNTER — Telehealth: Payer: Self-pay | Admitting: *Deleted

## 2012-12-24 NOTE — Telephone Encounter (Signed)
Dr Cyndie Chime has patient going to [facility] for possible kidney biopsy, patient wanted to make sure  Dr. Patty Sermons aware

## 2012-12-24 NOTE — Telephone Encounter (Signed)
Advised patient

## 2012-12-24 NOTE — Telephone Encounter (Signed)
Message copied by Burnell Blanks on Wed Dec 24, 2012  1:36 PM ------      Message from: Cassell Clement      Created: Wed Dec 24, 2012  9:45 AM       The blood sugar is better at 105.  The kidney function is normal.  The potassium is borderline low at 3.4 but this should improve now that we have cut his Lasix dose in half.  He can also eat fruit or a banana each day.

## 2012-12-24 NOTE — Telephone Encounter (Signed)
Yes I would like him to be on Lasix 20 mg daily now (until yesterday he was on 40).  I was not aware he was going to have a kidney biopsy--that has to do with his proteinuria.

## 2013-01-05 ENCOUNTER — Other Ambulatory Visit (HOSPITAL_COMMUNITY): Payer: Self-pay | Admitting: Nephrology

## 2013-01-05 DIAGNOSIS — R809 Proteinuria, unspecified: Secondary | ICD-10-CM

## 2013-01-11 ENCOUNTER — Encounter: Payer: Self-pay | Admitting: Oncology

## 2013-01-13 ENCOUNTER — Ambulatory Visit (HOSPITAL_BASED_OUTPATIENT_CLINIC_OR_DEPARTMENT_OTHER): Payer: 59 | Admitting: Lab

## 2013-01-13 DIAGNOSIS — C9000 Multiple myeloma not having achieved remission: Secondary | ICD-10-CM

## 2013-01-13 DIAGNOSIS — R803 Bence Jones proteinuria: Secondary | ICD-10-CM

## 2013-01-13 LAB — CBC WITH DIFFERENTIAL/PLATELET
BASO%: 0.4 % (ref 0.0–2.0)
Basophils Absolute: 0 10*3/uL (ref 0.0–0.1)
HCT: 40.3 % (ref 38.4–49.9)
LYMPH%: 27.7 % (ref 14.0–49.0)
MCH: 31.3 pg (ref 27.2–33.4)
MCHC: 34.6 g/dL (ref 32.0–36.0)
MONO#: 0.8 10*3/uL (ref 0.1–0.9)
NEUT%: 57.7 % (ref 39.0–75.0)
Platelets: 159 10*3/uL (ref 140–400)
WBC: 7.8 10*3/uL (ref 4.0–10.3)

## 2013-01-13 LAB — COMPREHENSIVE METABOLIC PANEL (CC13)
Albumin: 2.3 g/dL — ABNORMAL LOW (ref 3.5–5.0)
CO2: 28 mEq/L (ref 22–29)
Calcium: 8.4 mg/dL (ref 8.4–10.4)
Chloride: 104 mEq/L (ref 98–107)
Glucose: 98 mg/dl (ref 70–99)
Sodium: 140 mEq/L (ref 136–145)
Total Bilirubin: 0.58 mg/dL (ref 0.20–1.20)
Total Protein: 5.2 g/dL — ABNORMAL LOW (ref 6.4–8.3)

## 2013-01-21 ENCOUNTER — Encounter (HOSPITAL_COMMUNITY)
Admission: RE | Admit: 2013-01-21 | Discharge: 2013-01-21 | Disposition: A | Discharge status: Home / Self Care | Payer: 59 | Source: Ambulatory Visit | Attending: Nephrology | Admitting: Nephrology

## 2013-01-21 ENCOUNTER — Other Ambulatory Visit: Payer: Self-pay | Admitting: Radiology

## 2013-01-21 LAB — COMPREHENSIVE METABOLIC PANEL
ALT: 18 U/L (ref 0–53)
AST: 22 U/L (ref 0–37)
Calcium: 8.7 mg/dL (ref 8.4–10.5)
Creatinine, Ser: 0.98 mg/dL (ref 0.50–1.35)
Sodium: 141 mEq/L (ref 135–145)
Total Protein: 5.4 g/dL — ABNORMAL LOW (ref 6.0–8.3)

## 2013-01-21 LAB — PROTIME-INR
INR: 1.07 (ref 0.00–1.49)
Prothrombin Time: 13.8 seconds (ref 11.6–15.2)

## 2013-01-21 LAB — CBC WITH DIFFERENTIAL/PLATELET
Eosinophils Absolute: 0.2 10*3/uL (ref 0.0–0.7)
Eosinophils Relative: 3 % (ref 0–5)
Hemoglobin: 13.1 g/dL (ref 13.0–17.0)
Lymphocytes Relative: 22 % (ref 12–46)
Lymphs Abs: 1.6 10*3/uL (ref 0.7–4.0)
MCH: 30.8 pg (ref 26.0–34.0)
MCV: 88 fL (ref 78.0–100.0)
Monocytes Relative: 13 % — ABNORMAL HIGH (ref 3–12)
Neutrophils Relative %: 62 % (ref 43–77)
RBC: 4.25 MIL/uL (ref 4.22–5.81)
WBC: 7.3 10*3/uL (ref 4.0–10.5)

## 2013-01-21 LAB — TYPE AND SCREEN
ABO/RH(D): O NEG
Antibody Screen: NEGATIVE

## 2013-01-21 LAB — APTT: aPTT: 35 seconds (ref 24–37)

## 2013-01-21 LAB — ABO/RH: ABO/RH(D): O NEG

## 2013-01-22 ENCOUNTER — Encounter (HOSPITAL_COMMUNITY): Payer: Self-pay | Admitting: Pharmacist

## 2013-01-23 ENCOUNTER — Other Ambulatory Visit (HOSPITAL_COMMUNITY): Payer: Self-pay | Admitting: Nephrology

## 2013-01-23 ENCOUNTER — Encounter (HOSPITAL_COMMUNITY): Payer: Self-pay

## 2013-01-23 ENCOUNTER — Observation Stay (HOSPITAL_COMMUNITY)
Admission: RE | Admit: 2013-01-23 | Discharge: 2013-01-24 | Disposition: A | Discharge status: Home / Self Care | Payer: 59 | Source: Ambulatory Visit | Attending: Nephrology | Admitting: Nephrology

## 2013-01-23 DIAGNOSIS — I059 Rheumatic mitral valve disease, unspecified: Secondary | ICD-10-CM | POA: Insufficient documentation

## 2013-01-23 DIAGNOSIS — Z01812 Encounter for preprocedural laboratory examination: Secondary | ICD-10-CM | POA: Insufficient documentation

## 2013-01-23 DIAGNOSIS — R809 Proteinuria, unspecified: Secondary | ICD-10-CM

## 2013-01-23 DIAGNOSIS — D892 Hypergammaglobulinemia, unspecified: Principal | ICD-10-CM | POA: Insufficient documentation

## 2013-01-23 DIAGNOSIS — I10 Essential (primary) hypertension: Secondary | ICD-10-CM | POA: Insufficient documentation

## 2013-01-23 LAB — CBC
MCV: 87.5 fL (ref 78.0–100.0)
Platelets: 154 10*3/uL (ref 150–400)
RBC: 3.92 MIL/uL — ABNORMAL LOW (ref 4.22–5.81)
RDW: 13.4 % (ref 11.5–15.5)
WBC: 7.4 10*3/uL (ref 4.0–10.5)

## 2013-01-23 MED ORDER — SORBITOL 70 % SOLN: 30.0000 mL | Status: DC | PRN

## 2013-01-23 MED ORDER — ACETAMINOPHEN 325 MG PO TABS: 650.0000 mg | ORAL_TABLET | Freq: Four times a day (QID) | ORAL | Status: DC | PRN

## 2013-01-23 MED ORDER — DOCUSATE SODIUM 283 MG RE ENEM: 1.0000 | ENEMA | RECTAL | Status: DC | PRN

## 2013-01-23 MED ORDER — SODIUM CHLORIDE 0.9 % IV SOLN
INTRAVENOUS | Status: DC
  Administered 2013-01-23: 16:00:00 via INTRAVENOUS

## 2013-01-23 MED ORDER — NEPRO/CARBSTEADY PO LIQD: 237.0000 mL | Freq: Three times a day (TID) | ORAL | Status: DC | PRN

## 2013-01-23 MED ORDER — ONDANSETRON HCL 4 MG PO TABS: 4.0000 mg | ORAL_TABLET | Freq: Four times a day (QID) | ORAL | Status: DC | PRN

## 2013-01-23 MED ORDER — LORAZEPAM 1 MG PO TABS
ORAL_TABLET | ORAL | Status: AC
  Administered 2013-01-23: 1 mg via ORAL
  Filled 2013-01-23: qty 1

## 2013-01-23 MED ORDER — LORAZEPAM 1 MG PO TABS
1.0000 mg | ORAL_TABLET | Freq: Once | ORAL | Status: AC
  Administered 2013-01-23: 1 mg via ORAL

## 2013-01-23 MED ORDER — SODIUM CHLORIDE 0.9 % IV SOLN
INTRAVENOUS | Status: DC
  Administered 2013-01-23: 12:00:00 via INTRAVENOUS

## 2013-01-23 MED ORDER — ONDANSETRON HCL 4 MG/2ML IJ SOLN: 4.0000 mg | Freq: Four times a day (QID) | INTRAMUSCULAR | Status: DC | PRN

## 2013-01-23 MED ORDER — CALCIUM CARBONATE 1250 MG/5ML PO SUSP: 500.0000 mg | Freq: Four times a day (QID) | ORAL | Status: DC | PRN

## 2013-01-23 MED ORDER — ACETAMINOPHEN 650 MG RE SUPP: 650.0000 mg | Freq: Four times a day (QID) | RECTAL | Status: DC | PRN

## 2013-01-23 MED ORDER — ZOLPIDEM TARTRATE 5 MG PO TABS: 5.0000 mg | ORAL_TABLET | Freq: Every evening | ORAL | Status: DC | PRN

## 2013-01-23 NOTE — Procedures (Signed)
Patient in prone position.  Kidneys localized with U/S.  Prep clorohexadine, xylocaine LA.  Using U/S guidance 2 passes to obtain 2 right kidney cores of tissue.  EBL N/A.  Tolerated very well.  No acute complications.

## 2013-01-23 NOTE — H&P (Signed)
Eugene Martinez is an 67 y.o. male.  HPI: [name] is a 67yo WM with recent diagnosis of bence jones paraproteinuria.  He had undergone fat pad biopsy to r/o suspected amyloid which was negative and was referred to our practice for renal biopsy to r/o myeloma kidney.  He did have restricted pattern of lambda light chains c/w plasma cell dyscrasia but did not meet criteria for multiple myeloma.  He has held his aspirin for the last 10 days.    Past Medical History  Diagnosis Date  . Hypertension   . Syncope     a. 10/2012 Echo: EF 55-60%, severe LVH with speckling of myocardium -? amyloid. Mild MR, mod dil LA.  . Mild mitral regurgitation     a. by echo 10/2012    Allergies:  Allergies  Allergen Reactions  . Other Other (See Comments)    Seasonal allergies/grass/trees/dust causes watery eyes, stuffiness, sometimes swelling.    Medications: {medication reviewed/display:[id number] No results found for this or any previous visit (from the past 48 hour(s)).  No results found.  Blood pressure 99/66, pulse 89, temperature 98.3 F (36.8 C), temperature source Oral, resp. rate 20, height 5\' 6"  (1.676 m), weight 70.761 kg (156 lb), SpO2 99.00%. General appearance: alert, cooperative and no distress Head: Normocephalic, without obvious abnormality, atraumatic Neck: no adenopathy, no carotid bruit, no JVD, supple, symmetrical, trachea midline and thyroid not enlarged, symmetric, no tenderness/mass/nodules Resp: clear to auscultation bilaterally Cardio: regular rate and rhythm, S1, S2 normal, no murmur, click, rub or gallop GI: soft, non-tender; bowel sounds normal; no masses,  no organomegaly Extremities: extremities normal, atraumatic, no cyanosis or edema  Assessment/Plan: 1 bence jones proteinuria- s/p renal biopsy of right kidney.  23hour observation to r/o bleeding 2 HTN- stable 3 Dispo- d/c to home tomorrow as long as he does not bleed.  Thirza Pellicano A 01/23/2013, 1:35 PM

## 2013-01-24 ENCOUNTER — Other Ambulatory Visit: Payer: Self-pay

## 2013-01-24 LAB — RENAL FUNCTION PANEL
BUN: 18 mg/dL (ref 6–23)
CO2: 26 mEq/L (ref 19–32)
Calcium: 8.2 mg/dL — ABNORMAL LOW (ref 8.4–10.5)
Glucose, Bld: 92 mg/dL (ref 70–99)
Phosphorus: 3.8 mg/dL (ref 2.3–4.6)

## 2013-01-24 LAB — CBC
HCT: 34.9 % — ABNORMAL LOW (ref 39.0–52.0)
Hemoglobin: 12.2 g/dL — ABNORMAL LOW (ref 13.0–17.0)
MCH: 30.5 pg (ref 26.0–34.0)
MCHC: 35 g/dL (ref 30.0–36.0)

## 2013-01-24 MED ORDER — AZELASTINE HCL 0.1 % NA SOLN
1.0000 | Freq: Two times a day (BID) | NASAL | Status: DC | PRN
  Filled 2013-01-24: qty 30

## 2013-01-24 MED ORDER — ADULT MULTIVITAMIN W/MINERALS CH
1.0000 | ORAL_TABLET | Freq: Every day | ORAL | Status: DC
  Filled 2013-01-24: qty 1

## 2013-01-24 MED ORDER — LEVOCETIRIZINE DIHYDROCHLORIDE 5 MG PO TABS: 5.0000 mg | ORAL_TABLET | Freq: Every day | ORAL | Status: DC

## 2013-01-24 MED ORDER — LORATADINE 10 MG PO TABS
10.0000 mg | ORAL_TABLET | Freq: Every day | ORAL | Status: DC
  Filled 2013-01-24: qty 1

## 2013-01-24 MED ORDER — IBUPROFEN 200 MG PO TABS
400.0000 mg | ORAL_TABLET | Freq: Three times a day (TID) | ORAL | Status: DC | PRN
  Filled 2013-01-24: qty 3

## 2013-01-24 MED ORDER — VITAMIN D3 25 MCG (1000 UNIT) PO TABS
1000.0000 [IU] | ORAL_TABLET | Freq: Every day | ORAL | Status: DC
  Filled 2013-01-24: qty 1

## 2013-01-24 MED ORDER — MONTELUKAST SODIUM 10 MG PO TABS
10.0000 mg | ORAL_TABLET | Freq: Every day | ORAL | Status: DC
  Filled 2013-01-24: qty 1

## 2013-01-24 MED ORDER — TAMSULOSIN HCL 0.4 MG PO CAPS
0.4000 mg | ORAL_CAPSULE | Freq: Every day | ORAL | Status: DC
  Filled 2013-01-24: qty 1

## 2013-01-24 MED ORDER — ALBUTEROL SULFATE HFA 108 (90 BASE) MCG/ACT IN AERS
2.0000 | INHALATION_SPRAY | Freq: Four times a day (QID) | RESPIRATORY_TRACT | Status: DC | PRN
  Filled 2013-01-24: qty 6.7

## 2013-01-24 MED ORDER — FUROSEMIDE 20 MG PO TABS
20.0000 mg | ORAL_TABLET | Freq: Every morning | ORAL | Status: DC
  Filled 2013-01-24: qty 1

## 2013-01-24 MED ORDER — FINASTERIDE 5 MG PO TABS
5.0000 mg | ORAL_TABLET | Freq: Every morning | ORAL | Status: DC
  Filled 2013-01-24: qty 1

## 2013-01-24 MED ORDER — CENTRUM SILVER ULTRA MENS PO TABS: 1.0000 | ORAL_TABLET | Freq: Every morning | ORAL | Status: DC

## 2013-01-24 NOTE — Discharge Instructions (Signed)
No rigorous activity for 2 weeks. No aspirin for 1 more week. F/u with Dr. Cyndie Chime next week.

## 2013-01-24 NOTE — Discharge Summary (Signed)
Physician Discharge Summary  Patient ID: Eugene Martinez MRN: [id number] DOB/AGE: 1946/04/05 67 y.o.  Admit date: 01/23/2013 Discharge date: 01/24/2013  Admission Diagnoses:bence jones proteinuria/nephrotic syndrome, paraproteinuria, kidney biopsy Discharge Diagnoses: bence jones proteinuria/nephrotic syndrome, paraproteinuria, kidney biopsy Active Problems:   * No active hospital problems. *   Discharged Condition: good  Hospital Course: Pt was admitted for 23 hour observation following right kidney biopsy.  He had an uneventful night without hematuria and his hgb remained stable.  No complications.  Stable for discharge to home.  No rigorous activity for 2 weeks and hold off on aspirin for one more week.  He is to call with fevers, flank pain, or hematuria.  Treatments: IV hydration and procedures: biopsy: 2 right kidney cores  Blood pressure 102/64, pulse 89, temperature 98 F (36.7 C), temperature source Oral, resp. rate 20, height 5\' 6"  (1.676 m), weight 71.2 kg (156 lb 15.5 oz), SpO2 97.00%.  Disposition: 01-Home or Self Care   Future Appointments Provider Department Dept Phone   01/27/2013 11:00 AM Levert Feinstein, MD [facility] MEDICAL ONCOLOGY [phone]   02/20/2013 9:40 AM [facility] Main Office [place]) [phone]   02/20/2013 11:00 AM Cassell Clement, MD [facility] Main Office [place]) [phone]       Medication List    ASK your doctor about these medications       albuterol 108 (90 BASE) MCG/ACT inhaler  Commonly known as:  PROVENTIL HFA;VENTOLIN HFA  Inhale 2 puffs into the lungs every 6 (six) hours as needed for shortness of breath.     aspirin EC 81 MG tablet  Take 81 mg by mouth every evening.     azelastine 137 MCG/SPRAY nasal spray  Commonly known as:  ASTELIN  Place 1-2 sprays into both nostrils every 12 (twelve) hours as needed (allergies).     CENTRUM SILVER ULTRA MENS Tabs  Take 1 tablet by  mouth every morning.     cholecalciferol 1000 UNITS tablet  Commonly known as:  VITAMIN D  Take 1,000 Units by mouth at bedtime.     finasteride 5 MG tablet  Commonly known as:  PROSCAR  Take 5 mg by mouth every morning.     Fish Oil 1200 MG Caps  Take 1,200 mg by mouth 2 (two) times daily.     furosemide 40 MG tablet  Commonly known as:  LASIX  Take 20-40 mg by mouth every morning. 20 mg every day unless > 2 lb weight gain in a day or 5 lb in a week and will take 40 mg     ibuprofen 200 MG tablet  Commonly known as:  ADVIL,MOTRIN  Take 400-600 mg by mouth every 8 (eight) hours as needed for headache.     levocetirizine 5 MG tablet  Commonly known as:  XYZAL  Take 5 mg by mouth at bedtime. To relieve allergy symptoms     montelukast 10 MG tablet  Commonly known as:  SINGULAIR  Take 10 mg by mouth at bedtime.     omeprazole 20 MG capsule  Commonly known as:  PRILOSEC  Take 20 mg by mouth daily before breakfast. Takes 30 minutes before breakfast for acid reflux     Tamsulosin HCl 0.4 MG Caps  Commonly known as:  FLOMAX  Take 0.4 mg by mouth at bedtime.     triamcinolone 55 MCG/ACT nasal inhaler  Commonly known as:  NASACORT  Place 1-2 sprays into both nostrils daily as needed (  allergies).     vitamin E 400 UNIT capsule  Take 400 Units by mouth every morning.         Signed: Sabeen Piechocki A 01/24/2013, 12:30 PM

## 2013-01-24 NOTE — Discharge Summary (Signed)
Physician Discharge Summary  Patient ID: Eugene Martinez MRN: [id number] DOB/AGE: 1945/12/22 67 y.o.  Admit date: 01/23/2013 Discharge date: 01/24/2013  Admission Diagnoses:bence jones proteinuria/nephrotic syndrome, paraproteinuria, kidney biopsy  Discharge Diagnoses: bence jones proteinuria/nephrotic syndrome, paraproteinuria, kidney biopsy Active Problems:   * No active hospital problems. *   Discharged Condition: good  Hospital Course: pt brought in through [place] for right kidney biopsy.  Admitted for 23 hour observation.  He tolerated procedure well without hematuria.  Hbg remained stable.  Pt is stable for d/c to home today.  Treatments: procedures: biopsy: 2 core right kidney biopsies taken with US guidance.  Blood pressure 102/64, pulse 89, temperature 98 F (36.7 C), temperature source Oral, resp. rate 20, height 5\' 6"  (1.676 m), weight 71.2 kg (156 lb 15.5 oz), SpO2 97.00%.  Disposition: 01-Home or Self Care   Future Appointments Provider Department Dept Phone   01/27/2013 11:00 AM Levert Feinstein, MD [facility] MEDICAL ONCOLOGY [phone]   02/20/2013 9:40 AM [facility] Main Office [place]) [phone]   02/20/2013 11:00 AM Cassell Clement, MD [facility] Main Office [place]) [phone]       Medication List    STOP taking these medications       aspirin EC 81 MG tablet     ibuprofen 200 MG tablet  Commonly known as:  ADVIL,MOTRIN      TAKE these medications       albuterol 108 (90 BASE) MCG/ACT inhaler  Commonly known as:  PROVENTIL HFA;VENTOLIN HFA  Inhale 2 puffs into the lungs every 6 (six) hours as needed for shortness of breath.     azelastine 137 MCG/SPRAY nasal spray  Commonly known as:  ASTELIN  Place 1-2 sprays into both nostrils every 12 (twelve) hours as needed (allergies).     CENTRUM SILVER ULTRA MENS Tabs  Take 1 tablet by mouth every morning.     cholecalciferol 1000 UNITS tablet   Commonly known as:  VITAMIN D  Take 1,000 Units by mouth at bedtime.     finasteride 5 MG tablet  Commonly known as:  PROSCAR  Take 5 mg by mouth every morning.     Fish Oil 1200 MG Caps  Take 1,200 mg by mouth 2 (two) times daily.     furosemide 40 MG tablet  Commonly known as:  LASIX  Take 20-40 mg by mouth every morning. 20 mg every day unless > 2 lb weight gain in a day or 5 lb in a week and will take 40 mg     levocetirizine 5 MG tablet  Commonly known as:  XYZAL  Take 5 mg by mouth at bedtime. To relieve allergy symptoms     montelukast 10 MG tablet  Commonly known as:  SINGULAIR  Take 10 mg by mouth at bedtime.     omeprazole 20 MG capsule  Commonly known as:  PRILOSEC  Take 20 mg by mouth daily before breakfast. Takes 30 minutes before breakfast for acid reflux     Tamsulosin HCl 0.4 MG Caps  Commonly known as:  FLOMAX  Take 0.4 mg by mouth at bedtime.     triamcinolone 55 MCG/ACT nasal inhaler  Commonly known as:  NASACORT  Place 1-2 sprays into both nostrils daily as needed (allergies).     vitamin E 400 UNIT capsule  Take 400 Units by mouth every morning.         Signed: Zebedee Segundo A 01/24/2013, 12:42 PM

## 2013-01-27 ENCOUNTER — Ambulatory Visit (HOSPITAL_BASED_OUTPATIENT_CLINIC_OR_DEPARTMENT_OTHER): Payer: 59 | Admitting: Oncology

## 2013-01-27 ENCOUNTER — Telehealth: Payer: Self-pay | Admitting: Oncology

## 2013-01-27 VITALS — BP 119/71 | HR 105 | Temp 97.6°F | Resp 20 | Ht 66.0 in | Wt 162.5 lb

## 2013-01-27 DIAGNOSIS — R803 Bence Jones proteinuria: Secondary | ICD-10-CM

## 2013-01-27 DIAGNOSIS — R809 Proteinuria, unspecified: Secondary | ICD-10-CM

## 2013-01-27 NOTE — Progress Notes (Signed)
Hematology and Oncology Follow Up Visit  Eugene Martinez [id number] 1946/03/12 67 y.o. 01/27/2013 12:57 PM   Principle Diagnosis: Encounter Diagnoses  Name Primary?  . Proteinuria, Bence Jones Yes  . 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 He has nephrotic range nonselective proteinuria. Total protein in the urine almost 5 g (4875, 12/15/2012). Lambda light chains 937.5 mg percent in a total urine volume of 1500 cc. No initial anemia but hemoglobin now drifted down to 12 g. No bone lesions. Bone marrow biopsy done 11/27/2012  Showed only 5% plasma cells. Negative stains for amyloid. Serum lambda free light chains moderately elevated at 17.3 mg percent, kappa 1.21, ratio 0.07,  done 01/13/2013. Total serum immunoglobulins all depressed. Open abdominal fat biopsy negative for amyloidosis. He does not meet criteria for multiple myeloma. I referred him to nephrology for a kidney biopsy since I am still concerned that he may actually have amyloidosis despite negative studies above.  He just had a kidney biopsy on Friday, February 14. Results not yet back.  He has no new complaints today.  Medications: reviewed  Allergies:  Allergies  Allergen Reactions  . Other Other (See Comments)    Seasonal allergies/grass/trees/dust causes watery eyes, stuffiness, sometimes swelling.    Review of Systems: Constitutional:   No constitutional symptoms Respiratory: No cough or dyspnea Cardiovascular:  No recurrent chest pain, palpitations, or syncopal episodes Gastrointestinal: Genito-Urinary: Urine still foamy from proteinuria Musculoskeletal: No muscle bone or joint pain Neurologic: No headache or change in vision Skin: No rash or ecchymosis Remaining ROS negative.  Physical Exam: Blood pressure 119/71, pulse 105, temperature 97.6 F (36.4 C), temperature source Oral,  resp. rate 20, height 5\' 6"  (1.676 m), weight 162 lb 8 oz (73.71 kg). Wt Readings from Last 3 Encounters:  01/27/13 162 lb 8 oz (73.71 kg)  01/23/13 156 lb 15.5 oz (71.2 kg)  12/23/12 162 lb (73.483 kg)     General appearance: Thin Caucasian man HENNT: Pharynx no erythema or exudate Lymph nodes: No adenopathy Breasts: No adenopathy Lungs: Clear to auscultation resonant to percussion Heart: Regular rhythm 1-2/6 systolic murmur at the apex Abdomen: Soft, nontender, no mass, no organomegaly Extremities: 1+ edema, no calf tenderness Vascular: No cyanosis Neurologic: No focal deficit Skin: Sallow but anicteric  Lab Results: Lab Results  Component Value Date   WBC 8.4 01/24/2013   HGB 12.2* 01/24/2013   HCT 34.9* 01/24/2013   MCV 87.3 01/24/2013   PLT 157 01/24/2013     Chemistry      Component Value Date/Time   NA 139 01/24/2013 0500   NA 140 01/13/2013 0942   K 4.1 01/24/2013 0500   K 4.0 01/13/2013 0942   CL 106 01/24/2013 0500   CL 104 01/13/2013 0942   CO2 26 01/24/2013 0500   CO2 28 01/13/2013 0942   BUN 18 01/24/2013 0500   BUN 19.8 01/13/2013 0942   CREATININE 1.04 01/24/2013 0500   CREATININE 1.0 01/13/2013 0942   CREATININE 0.93 12/15/2012 0832      Component Value Date/Time   CALCIUM 8.2* 01/24/2013 0500   CALCIUM 8.4 01/13/2013 0942   ALKPHOS 80 01/21/2013 0932   ALKPHOS 73 01/13/2013 0942   AST 22 01/21/2013 0932   AST 27 01/13/2013 0942   ALT 18 01/21/2013 0932   ALT 30 01/13/2013 0942   BILITOT 0.8 01/21/2013 0932   BILITOT 0.58  01/13/2013 4540       Radiological Studies: US Biopsy-no Radiologist  01/23/2013  CLINICAL DATA: Proteinuria   Ultrasound was utilized for biopsy by the requesting physician.      Impression and Plan: Lambda light chain proteinuria Kidney biopsy done result pending. He will see his nephrologist next week. I will see him the following week to decide on a treatment plan based on the results of the kidney biopsy.    CC:.    Levert Feinstein,  MD 2/18/201412:57 PM

## 2013-01-27 NOTE — Telephone Encounter (Signed)
gv and printed appt schedule for pt for March °

## 2013-02-04 ENCOUNTER — Encounter: Payer: Self-pay | Admitting: Cardiology

## 2013-02-09 ENCOUNTER — Other Ambulatory Visit: Payer: Self-pay | Admitting: Cardiology

## 2013-02-10 ENCOUNTER — Other Ambulatory Visit: Payer: Self-pay

## 2013-02-10 MED ORDER — FUROSEMIDE 20 MG PO TABS: 20.0000 mg | ORAL_TABLET | Freq: Every day | ORAL | Status: DC

## 2013-02-13 ENCOUNTER — Telehealth: Payer: Self-pay | Admitting: *Deleted

## 2013-02-13 ENCOUNTER — Other Ambulatory Visit: Payer: Self-pay | Admitting: *Deleted

## 2013-02-13 ENCOUNTER — Ambulatory Visit (HOSPITAL_BASED_OUTPATIENT_CLINIC_OR_DEPARTMENT_OTHER): Payer: 59 | Admitting: Oncology

## 2013-02-13 ENCOUNTER — Encounter: Payer: Self-pay | Admitting: *Deleted

## 2013-02-13 ENCOUNTER — Telehealth: Payer: Self-pay | Admitting: Oncology

## 2013-02-13 ENCOUNTER — Other Ambulatory Visit (HOSPITAL_BASED_OUTPATIENT_CLINIC_OR_DEPARTMENT_OTHER): Payer: 59 | Admitting: Lab

## 2013-02-13 VITALS — BP 101/66 | HR 100 | Temp 97.0°F | Resp 20 | Ht 66.0 in | Wt 168.6 lb

## 2013-02-13 DIAGNOSIS — E8589 Other amyloidosis: Secondary | ICD-10-CM

## 2013-02-13 DIAGNOSIS — E8581 Light chain (AL) amyloidosis: Secondary | ICD-10-CM

## 2013-02-13 DIAGNOSIS — E859 Amyloidosis, unspecified: Secondary | ICD-10-CM

## 2013-02-13 DIAGNOSIS — R809 Proteinuria, unspecified: Secondary | ICD-10-CM

## 2013-02-13 DIAGNOSIS — R803 Bence Jones proteinuria: Secondary | ICD-10-CM

## 2013-02-13 LAB — COMPREHENSIVE METABOLIC PANEL (CC13)
ALT: 29 U/L (ref 0–55)
Albumin: 2.2 g/dL — ABNORMAL LOW (ref 3.5–5.0)
CO2: 27 mEq/L (ref 22–29)
Calcium: 8.4 mg/dL (ref 8.4–10.4)
Chloride: 106 mEq/L (ref 98–107)
Glucose: 109 mg/dl — ABNORMAL HIGH (ref 70–99)
Potassium: 4.1 mEq/L (ref 3.5–5.1)
Sodium: 141 mEq/L (ref 136–145)
Total Bilirubin: 0.69 mg/dL (ref 0.20–1.20)
Total Protein: 5.2 g/dL — ABNORMAL LOW (ref 6.4–8.3)

## 2013-02-13 LAB — CBC WITH DIFFERENTIAL/PLATELET
BASO%: 0.7 % (ref 0.0–2.0)
Basophils Absolute: 0.1 10*3/uL (ref 0.0–0.1)
HCT: 40.2 % (ref 38.4–49.9)
HGB: 13.9 g/dL (ref 13.0–17.1)
LYMPH%: 30.1 % (ref 14.0–49.0)
MCH: 31.1 pg (ref 27.2–33.4)
MCHC: 34.5 g/dL (ref 32.0–36.0)
MONO#: 0.7 10*3/uL (ref 0.1–0.9)
NEUT%: 53.6 % (ref 39.0–75.0)
Platelets: 156 10*3/uL (ref 140–400)
WBC: 7.7 10*3/uL (ref 4.0–10.3)

## 2013-02-13 MED ORDER — CYCLOPHOSPHAMIDE 50 MG PO TABS: ORAL_TABLET | ORAL | Status: DC

## 2013-02-13 MED ORDER — ONDANSETRON 8 MG PO TBDP: ORAL_TABLET | ORAL | Status: DC

## 2013-02-13 MED ORDER — ACYCLOVIR 400 MG PO TABS: 400.0000 mg | ORAL_TABLET | Freq: Two times a day (BID) | ORAL | Status: DC

## 2013-02-13 MED ORDER — DEXAMETHASONE 4 MG PO TABS: ORAL_TABLET | ORAL | Status: DC

## 2013-02-13 NOTE — Telephone Encounter (Signed)
Gave pt appt for lab ,MD and chemo for March and April 2014

## 2013-02-13 NOTE — Patient Instructions (Signed)
Begin: Cytoxan 50 mg tabs  10 tabs = 500 mg by mouth weekly Velcade  Weekly injection in our office - tentative start date 02/20/13 Dexsamethasone 4 mg tabs  5 tabs twice daily every week on day you get velcade  Acyclovir 400 mg twice daily while you are on treatment to reduce chance of shingles infection Zofran ODT 1 under tongue up to every 6 hours as needed for nauseFollow up visit with Dr Reece Agar 4/4 10-10:30

## 2013-02-13 NOTE — Telephone Encounter (Signed)
Per staff message and POF I have scheduled appts.  JMW  

## 2013-02-13 NOTE — Progress Notes (Signed)
Hematology and Oncology Follow Up Visit  Eugene Martinez [id number] Jul 26, 1946 67 y.o. 02/13/2013 11:24 AM   Principle Diagnosis: Encounter Diagnoses  Name Primary?  . AL amyloidosis Yes  . 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 The patient presented with a near syncopal episode back in November 2013. He was found to have asymptomatic proteinuria with a normal BUN and creatinine. As part of his cardiac evaluation he was found to have concentric left ventricular hypertrophy, a "speckled pattern" on cardiac MRI. He had a normal troponin level. Normal left ventricular ejection fraction. He was not anemic at that time. He was referred to me on suspicion of amyloidosis. He had a open abdominal fat pad biopsy on December 13 which was negative for amyloid. He had a bone marrow aspiration biopsy on December 19 which show plasma cells at upper limit of normal at 5% and negative staining for amyloidosis.   Medications: reviewed  Allergies:  Allergies  Allergen Reactions  . Other Other (See Comments)    Seasonal allergies/grass/trees/dust causes watery eyes, stuffiness, sometimes swelling.    Review of Systems: Constitutional:   Increasing anorexia. He is starting to lose weight. 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 He feels that his base weight is now down to 161 pounds. Respiratory: No dyspnea Cardiovascular:  No chest pain or palpitations. No more presyncopal episodes Gastrointestinal: No abdominal complaints Genito-Urinary: Not questioned today Musculoskeletal: Not questioned Neurologic: Not questioned Skin: No rash or ecchymosis Remaining ROS negative.  Physical Exam: Blood pressure 101/66, pulse 100, temperature 97 F (36.1 C), temperature source Oral, resp. rate 20, height 5\' 6"  (1.676  m), weight 168 lb 9.6 oz (76.476 kg). Wt Readings from Last 3 Encounters:  02/13/13 168 lb 9.6 oz (76.476 kg)  01/27/13 162 lb 8 oz (73.71 kg)  01/23/13 156 lb 15.5 oz (71.2 kg)     General appearance: Adequately nourished Caucasian man Complete exam not done today  Lab Results: Lab Results  Component Value Date   WBC 7.7 02/13/2013   HGB 13.9 02/13/2013   HCT 40.2 02/13/2013   MCV 90.1 02/13/2013   PLT 156 02/13/2013     Chemistry      Component Value Date/Time   NA 141 02/13/2013 0934   NA 139 01/24/2013 0500   K 4.1 02/13/2013 0934   K 4.1 01/24/2013 0500   CL 106 02/13/2013 0934   CL 106 01/24/2013 0500   CO2 27 02/13/2013 0934   CO2 26 01/24/2013 0500   BUN 14.4 02/13/2013 0934   BUN 18 01/24/2013 0500   CREATININE 0.9 02/13/2013 0934   CREATININE 1.04 01/24/2013 0500   CREATININE 0.93 12/15/2012 0832      Component Value Date/Time   CALCIUM 8.4 02/13/2013 0934   CALCIUM 8.2* 01/24/2013 0500   ALKPHOS 79 02/13/2013 0934   ALKPHOS 80 01/21/2013 0932   AST 26 02/13/2013 0934   AST 22 01/21/2013 0932   ALT 29 02/13/2013 0934   ALT 18 01/21/2013 0932   BILITOT 0.69 02/13/2013 0934   BILITOT 0.8 01/21/2013 0932       Radiological Studies: US Biopsy-no Radiologist  01/23/2013  CLINICAL DATA: Proteinuria   Ultrasound was utilized for biopsy by the requesting physician.      Impression and Plan: #  1. A.L. Amyloidosis  Over one hour face-to-face discussion with patient and his wife re diagnosis, prognosis, and available treatments. We are now finally starting to see some encouraging data for response rates in this disease with the addition of bortezomib (Velcade) to the standard traditional mix of alkylating agent and prednisone. Two back to back papers reported in the journal Blood in May of 2012, although with only small numbers of patients, showed quite an impressive response rates. The first paper by Dr. Campbell Stall 'group at the [facility] in [place] using a combination of Cytoxan, Velcade, and  dexamethasone showed a 94% overall response rate in  18 patients, 71% achieving a complete hematologic response and 24% a partial response. Responses are occurred rapidly within 2 months. 58% of these patients had symptomatic cardiac involvement and 82% had 2 or more organs involved.  The second paper by Deborha Payment and colleagues from the [place] using the same regimen in 43 patient's showed an overall response rate of 81% with complete responses and 42% and very good partial response in  51% of patients with an estimated 2 year progression free survival of 66.5% in previously untreated patients and 41% for relapsed patients. 74% of these patients had cardiac involvement and 46% were [facility] cardiac stage III. Although these are only small cohorts of patients these results are unprecedented for this disease.  I outlined a program of  induction with 3-4 months of Cytoxan, Velcade dexamethasone followed by consolidation with high-dose IV melphalan. Maintenance is now a standard part of treatment after transplant for multiple myeloma and my guess is that it will also become a standard part of  treatment for amyloidosis in the future.  I have made an e-mail referral to my colleague Dr. Barbaraann Boys at [facility] and will set up a formal consultation for the patient with respect to high-dose IV melphalan consolidation with autologous stem cell support following induction treatment.,  Overview of the treatment program discussed in detail with the patient and his wife and written notes were given to them. Discussion included potential side effects.  Plan: Cytoxan 300 mg per meter squared based on his current body surface area 1.8 m total dose of 500 mg by mouth weekly, Velcade 1.5 mg per meter squared subcutaneous weekly, Dexamethasone 40 mg weekly given on the day of Velcade Acyclovir 400 mg twice a day zoster prophylaxis Zofran 8 mg sublingual every 6-8 hours when necessary nausea.    CC:. Dr. Cassell Clement;  Dr. Dierdre Highman; Dr. Marcello Fennel; Dr. Garth Bigness at [facility], MD 3/7/201411:24 AM

## 2013-02-16 LAB — KAPPA/LAMBDA LIGHT CHAINS: Kappa:Lambda Ratio: 0.07 — ABNORMAL LOW (ref 0.26–1.65)

## 2013-02-17 ENCOUNTER — Other Ambulatory Visit: Payer: Self-pay | Admitting: Oncology

## 2013-02-20 ENCOUNTER — Other Ambulatory Visit (HOSPITAL_BASED_OUTPATIENT_CLINIC_OR_DEPARTMENT_OTHER): Payer: 59

## 2013-02-20 ENCOUNTER — Ambulatory Visit (INDEPENDENT_AMBULATORY_CARE_PROVIDER_SITE_OTHER): Payer: 59 | Admitting: Cardiology

## 2013-02-20 ENCOUNTER — Other Ambulatory Visit: Payer: Self-pay | Admitting: *Deleted

## 2013-02-20 ENCOUNTER — Ambulatory Visit (HOSPITAL_BASED_OUTPATIENT_CLINIC_OR_DEPARTMENT_OTHER): Payer: 59

## 2013-02-20 ENCOUNTER — Other Ambulatory Visit: Payer: Self-pay | Admitting: Oncology

## 2013-02-20 ENCOUNTER — Encounter: Payer: Self-pay | Admitting: Cardiology

## 2013-02-20 ENCOUNTER — Other Ambulatory Visit (INDEPENDENT_AMBULATORY_CARE_PROVIDER_SITE_OTHER): Payer: 59

## 2013-02-20 VITALS — BP 91/53 | HR 77 | Temp 97.6°F | Resp 20

## 2013-02-20 VITALS — BP 98/58 | HR 84 | Ht 66.0 in | Wt 170.4 lb

## 2013-02-20 DIAGNOSIS — Z79899 Other long term (current) drug therapy: Secondary | ICD-10-CM

## 2013-02-20 DIAGNOSIS — I10 Essential (primary) hypertension: Secondary | ICD-10-CM

## 2013-02-20 DIAGNOSIS — E8581 Light chain (AL) amyloidosis: Secondary | ICD-10-CM

## 2013-02-20 DIAGNOSIS — R803 Bence Jones proteinuria: Secondary | ICD-10-CM

## 2013-02-20 DIAGNOSIS — E854 Organ-limited amyloidosis: Secondary | ICD-10-CM

## 2013-02-20 DIAGNOSIS — R55 Syncope and collapse: Secondary | ICD-10-CM

## 2013-02-20 DIAGNOSIS — R809 Proteinuria, unspecified: Secondary | ICD-10-CM

## 2013-02-20 DIAGNOSIS — Z5112 Encounter for antineoplastic immunotherapy: Secondary | ICD-10-CM

## 2013-02-20 DIAGNOSIS — E859 Amyloidosis, unspecified: Secondary | ICD-10-CM

## 2013-02-20 DIAGNOSIS — R0989 Other specified symptoms and signs involving the circulatory and respiratory systems: Secondary | ICD-10-CM

## 2013-02-20 DIAGNOSIS — E8589 Other amyloidosis: Secondary | ICD-10-CM

## 2013-02-20 DIAGNOSIS — E639 Nutritional deficiency, unspecified: Secondary | ICD-10-CM

## 2013-02-20 DIAGNOSIS — I5032 Chronic diastolic (congestive) heart failure: Secondary | ICD-10-CM

## 2013-02-20 LAB — CBC WITH DIFFERENTIAL/PLATELET
BASO%: 0.1 % (ref 0.0–2.0)
Basophils Absolute: 0 10*3/uL (ref 0.0–0.1)
Basophils Relative: 0 % (ref 0.0–3.0)
EOS%: 0 % (ref 0.0–7.0)
Eosinophils Relative: 0 % (ref 0.0–5.0)
HCT: 37.8 % — ABNORMAL LOW (ref 38.4–49.9)
HGB: 12.6 g/dL — ABNORMAL LOW (ref 13.0–17.1)
LYMPH%: 8.2 % — ABNORMAL LOW (ref 14.0–49.0)
Lymphocytes Relative: 7.4 % — ABNORMAL LOW (ref 12.0–46.0)
MCH: 30.1 pg (ref 27.2–33.4)
MCHC: 33.5 g/dL (ref 32.0–36.0)
MONO#: 1.7 10*3/uL — ABNORMAL HIGH (ref 0.1–0.9)
Monocytes Absolute: 1.5 10*3/uL — ABNORMAL HIGH (ref 0.1–1.0)
Monocytes Relative: 7.7 % (ref 3.0–12.0)
NEUT%: 83.3 % — ABNORMAL HIGH (ref 39.0–75.0)
Neutrophils Relative %: 84.9 % — ABNORMAL HIGH (ref 43.0–77.0)
Platelets: 142 10*3/uL (ref 140–400)
Platelets: 146 10*3/uL — ABNORMAL LOW (ref 150.0–400.0)
RBC: 4.11 Mil/uL — ABNORMAL LOW (ref 4.22–5.81)
WBC: 19.6 10*3/uL (ref 4.5–10.5)
lymph#: 1.7 10*3/uL (ref 0.9–3.3)

## 2013-02-20 LAB — BASIC METABOLIC PANEL (CC13)
BUN: 30.7 mg/dL — ABNORMAL HIGH (ref 7.0–26.0)
CO2: 23 mEq/L (ref 22–29)
Calcium: 8.4 mg/dL (ref 8.4–10.4)
Chloride: 107 mEq/L (ref 98–107)
Creatinine: 1 mg/dL (ref 0.7–1.3)

## 2013-02-20 LAB — BASIC METABOLIC PANEL
BUN: 30 mg/dL — ABNORMAL HIGH (ref 6–23)
Chloride: 103 mEq/L (ref 96–112)
GFR: 68.47 mL/min (ref >60.00)
Glucose, Bld: 126 mg/dL — ABNORMAL HIGH (ref 70–99)
Potassium: 3.9 mEq/L (ref 3.5–5.1)
Sodium: 135 mEq/L (ref 135–145)

## 2013-02-20 MED ORDER — BORTEZOMIB CHEMO SQ INJECTION 3.5 MG (2.5MG/ML)
1.5000 mg/m2 | Freq: Once | INTRAMUSCULAR | Status: AC
  Administered 2013-02-20: 2.75 mg via SUBCUTANEOUS
  Filled 2013-02-20: qty 2.75

## 2013-02-20 MED ORDER — ONDANSETRON HCL 8 MG PO TABS
8.0000 mg | ORAL_TABLET | Freq: Once | ORAL | Status: AC
  Administered 2013-02-20: 8 mg via ORAL

## 2013-02-20 NOTE — Assessment & Plan Note (Signed)
The patient has a previous history of hypertension requiring antihypertensive therapy.  In the hospital his blood pressure ran low and all of his hypertensive drugs were discontinued.  He remains on only Lasix 20 mg daily.

## 2013-02-20 NOTE — Progress Notes (Signed)
Eugene Martinez Date of Birth:  1946/11/29 [facility] [address] [place], [place]  [address] [phone]         Fax   [phone]  History of Present Illness: This pleasant 67 year old gentleman is seen back for a followup office visit. He was  hospitalized several months ago at [facility] after experiencing exertional syncope while  walking up steps at a conference at [place]. Workup in the hospital revealed significant left ventricular hypertrophy and a speckling of the myocardium on two-dimensional echocardiogram suggestive of possible amyloid heart disease. Systolic function was normal with ejection fraction of 55-60% in the left atrium was moderately dilated. The patient had a cardiac MRI looking for amyloid heart disease. Left ventricular wall motion was normal with an ejection fraction of 66%. There appeared to be mild right ventricular hypertrophy and normal right atrial size and mild to moderate mitral regurgitation. When delayed enhancement imaging was attempted, they were unable to null the left ventricular myocardium. The patient was monitored in the hospital and had no arrhythmias. His blood pressure remained low normal and he was discharged on no medication for blood pressure but with the instructions to take 40 mg of Lasix as needed if his weight increased or if he had unusual shortness of breath. 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  Further outpatient workup included a fat pad biopsy which was not diagnostic for myeloma or amyloidosis and a bone marrow biopsy which was not diagnostic.  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  Dr. Cyndie Chime is his oncologist and Dr. Arrie Aran is his nephrologist.   Current Outpatient  Prescriptions  Medication Sig Dispense Refill  . acyclovir (ZOVIRAX) 400 MG tablet Take 1 tablet (400 mg total) by mouth 2 (two) times daily.  60 tablet  11  . albuterol (PROVENTIL HFA;VENTOLIN HFA) 108 (90 BASE) MCG/ACT inhaler Inhale 2 puffs into the lungs every 6 (six) hours as needed for shortness of breath.      Marland Kitchen azelastine (ASTELIN) 137 MCG/SPRAY nasal spray Place 1-2 sprays into both nostrils every 12 (twelve) hours as needed (allergies).       . cholecalciferol (VITAMIN D) 1000 UNITS tablet Take 1,000 Units by mouth at bedtime.       . cyclophosphamide (CYTOXAN) 50 MG tablet Take 10 tabs (500mg ) po once weekly. Give on an empty stomach 1 hour before or 2 hours after meals.  40 tablet  3  . dexamethasone (DECADRON) 4 MG tablet Take 5 tabs (20mg ) PO BID every Monday, or as directed.  80 tablet  5  . finasteride (PROSCAR) 5 MG tablet Take 5 mg by mouth every morning.       . furosemide (LASIX) 20 MG tablet Take 1 tablet (20 mg total) by mouth daily. 20 mg every day unless > 2 lb weight gain in a day or 5 lb in a week and will take 40 mg  30 tablet  6  . levocetirizine (XYZAL) 5 MG tablet Take 5 mg by mouth at bedtime. To relieve allergy symptoms      . montelukast (SINGULAIR) 10 MG tablet Take 10 mg by mouth at bedtime.      . Multiple Vitamins-Minerals (CENTRUM SILVER ULTRA MENS) TABS Take 1 tablet by mouth every morning.       Marland Kitchen  Omega-3 Fatty Acids (FISH OIL) 1200 MG CAPS Take 1,200 mg by mouth 2 (two) times daily.       Marland Kitchen omeprazole (PRILOSEC) 20 MG capsule Take 20 mg by mouth daily before breakfast. Takes 30 minutes before breakfast for acid reflux      . ondansetron (ZOFRAN ODT) 8 MG disintegrating tablet 1 tab PO or SL q6hrs prn nausea/vomiting  25 tablet  11  . Tamsulosin HCl (FLOMAX) 0.4 MG CAPS Take 0.4 mg by mouth at bedtime.       . triamcinolone (NASACORT) 55 MCG/ACT nasal inhaler Place 1-2 sprays into both nostrils daily as needed (allergies).       . vitamin E 400 UNIT capsule  Take 400 Units by mouth every morning.        No current facility-administered medications for this visit.    Allergies  Allergen Reactions  . Other Other (See Comments)    Seasonal allergies/grass/trees/dust causes watery eyes, stuffiness, sometimes swelling.    Patient Active Problem List  Diagnosis  . Syncope  . Multiple allergies  . Hypertension  . RBBB (right bundle branch block with left anterior fascicular block)  . Proteinuria  . Chronic diastolic heart failure  . Proteinuria, Bence Jones  . Weight loss, non-intentional  . AL amyloidosis    History  Smoking status  . Never Smoker   Smokeless tobacco  . Never Used    History  Alcohol Use No    No family history on file.  Review of Systems: Constitutional: no fever chills diaphoresis or fatigue or change in weight.  Head and neck: no hearing loss, no epistaxis, no photophobia or visual disturbance. Respiratory: No cough, shortness of breath or wheezing. Cardiovascular: No chest pain peripheral edema, palpitations. Gastrointestinal: No abdominal distention, no abdominal pain, no change in bowel habits hematochezia or melena. Genitourinary: No dysuria, no frequency, no urgency, no nocturia. Musculoskeletal:No arthralgias, no back pain, no gait disturbance or myalgias. Neurological: No dizziness, no headaches, no numbness, no seizures, no syncope, no weakness, no tremors. Hematologic: No lymphadenopathy, no easy bruising. Psychiatric: No confusion, no hallucinations, no sleep disturbance.    Physical Exam: Filed Vitals:   02/20/13 1121  BP: 98/58  Pulse: 84   the general appearance reveals a well-developed well-nourished pleasant gentleman in no distress.The head and neck exam reveals pupils equal and reactive.  Extraocular movements are full.  There is no scleral icterus.  The mouth and pharynx are normal.  The neck is supple.  The carotids reveal no bruits.  The jugular venous pressure is normal.  The   thyroid is not enlarged.  There is no lymphadenopathy.  The chest is clear to percussion and auscultation.  There are no rales or rhonchi.  Expansion of the chest is symmetrical.  The precordium is quiet.  The first heart sound is normal.  The second heart sound is physiologically split.  There is no  gallop rub or click.  There is a soft systolic ejection murmur at the left sternal edge.  The previous S3 gallop is no longer heard. There is no abnormal lift or heave.  The abdomen is soft and nontender.  The bowel sounds are normal.  The liver and spleen are not enlarged.  There are no abdominal masses.  There are no abdominal bruits.  Extremities reveal good pedal pulses.  There is mild soft pretibial and ankle edema.  There is no cyanosis or clubbing.  Strength is normal and symmetrical in all extremities.  There is  no lateralizing weakness.  There are no sensory deficits.  The skin is warm and dry.  There is no rash.   EKG today shows rhythm with bifascicular block and is unchanged since 09/28/12  Assessment / Plan: The patient will continue same medication.  Continue low-dose Lasix.  We can increase the dose if necessary if his peripheral edema becomes worse but we are limited by his low blood pressure. We will plan to see the patient back for a followup visit in 3 months.  Today we drew a basal metabolic panel and a CBC with differential.

## 2013-02-20 NOTE — Patient Instructions (Signed)
Will obtain labs today and call you with the results (cbc/bmet)  Your physician recommends that you continue on your current medications as directed. Please refer to the Current Medication list given to you today.  Your physician recommends that you schedule a follow-up appointment in: 3 month ov

## 2013-02-20 NOTE — Assessment & Plan Note (Signed)
Since last visit the patient has been stable from the cardiac standpoint.  He has not been having any chest pain.  Is not having a dyspnea on ordinary activity but does have mild dyspnea with rapid walking exertion such as walking to his office from the parking lot.  His blood pressure tends to remain low which has limited how much furosemide we can give him.  At the present time he is on furosemide 20 mg daily.  He has had some mild peripheral edema and he is now on corticosteroids for his amyloidosis chemotherapy.

## 2013-02-20 NOTE — Assessment & Plan Note (Signed)
The patient has had no further episodes of syncope since his hospitalization several months ago.

## 2013-02-20 NOTE — Progress Notes (Signed)
Confirmed with patient he has started his acyclovir. Took Decadron and Cytoxan today at home. Scheduled him for a chemo class on 02/24/13 at 5pm. Encouraged him to bring his wife to class with him. Reviewed potential side effects of chemo meds and consent signed. MD office not states Velcade is SQ and this was also understanding of patient. Dr.Sherrill revised care plan in Dr. Patsy Lager absence.

## 2013-02-20 NOTE — Patient Instructions (Signed)
[facility] Discharge Instructions for Patients Receiving Chemotherapy  Today you received the following chemotherapy agents : velcade (cytoxan dose taken at home)  To help prevent nausea and vomiting after your treatment, we encourage you to take your nausea medication : Zofran   Zofran 8 mg ODT every 6 hours as needed for nausea  Push fluids by mouth for the next 48 hours to protect your bladder.   If you develop nausea and vomiting that is not controlled by your nausea medication, call the clinic. If it is after clinic hours your family physician or the after hours number for the clinic or go to the Emergency Department.   BELOW ARE SYMPTOMS THAT SHOULD BE REPORTED IMMEDIATELY:  *FEVER GREATER THAN 100.5 F  *CHILLS WITH OR WITHOUT FEVER  NAUSEA AND VOMITING THAT IS NOT CONTROLLED WITH YOUR NAUSEA MEDICATION  *UNUSUAL SHORTNESS OF BREATH  *UNUSUAL BRUISING OR BLEEDING  TENDERNESS IN MOUTH AND THROAT WITH OR WITHOUT PRESENCE OF ULCERS  *URINARY PROBLEMS  *BOWEL PROBLEMS  UNUSUAL RASH Items with * indicate a potential emergency and should be followed up as soon as possible.  One of the nurses will contact you Monday.  Please let the nurse know about any problems that you may have experienced. Feel free to call the clinic you have any questions or concerns. The clinic phone number is [phone].   I have been informed and understand all the instructions given to me. I know to contact the clinic, my physician, or go to the Emergency Department if any problems should occur. I do not have any questions at this time, but understand that I may call the clinic during office hours   should I have any questions or need assistance in obtaining follow up care.    __________________________________________  _____________  __________ Signature of Patient or Authorized Representative            Date                    Time    __________________________________________ Nurse's Signature

## 2013-02-24 ENCOUNTER — Encounter: Payer: Self-pay | Admitting: *Deleted

## 2013-02-24 ENCOUNTER — Other Ambulatory Visit: Payer: 59

## 2013-02-27 ENCOUNTER — Ambulatory Visit (HOSPITAL_BASED_OUTPATIENT_CLINIC_OR_DEPARTMENT_OTHER): Payer: 59

## 2013-02-27 ENCOUNTER — Other Ambulatory Visit (HOSPITAL_BASED_OUTPATIENT_CLINIC_OR_DEPARTMENT_OTHER): Payer: 59 | Admitting: Lab

## 2013-02-27 ENCOUNTER — Other Ambulatory Visit: Payer: Self-pay | Admitting: Oncology

## 2013-02-27 VITALS — BP 123/76 | HR 106 | Temp 97.6°F | Resp 18

## 2013-02-27 DIAGNOSIS — R803 Bence Jones proteinuria: Secondary | ICD-10-CM

## 2013-02-27 DIAGNOSIS — E8581 Light chain (AL) amyloidosis: Secondary | ICD-10-CM

## 2013-02-27 DIAGNOSIS — R809 Proteinuria, unspecified: Secondary | ICD-10-CM

## 2013-02-27 DIAGNOSIS — E8589 Other amyloidosis: Secondary | ICD-10-CM

## 2013-02-27 DIAGNOSIS — Z5112 Encounter for antineoplastic immunotherapy: Secondary | ICD-10-CM

## 2013-02-27 LAB — CBC WITH DIFFERENTIAL/PLATELET
BASO%: 0.3 % (ref 0.0–2.0)
LYMPH%: 27.3 % (ref 14.0–49.0)
MCHC: 35.1 g/dL (ref 32.0–36.0)
MCV: 87 fL (ref 79.3–98.0)
MONO%: 10.1 % (ref 0.0–14.0)
Platelets: 195 10*3/uL (ref 140–400)
RBC: 4.39 10*6/uL (ref 4.20–5.82)
WBC: 12.2 10*3/uL — ABNORMAL HIGH (ref 4.0–10.3)
nRBC: 0 % (ref 0–0)

## 2013-02-27 MED ORDER — BORTEZOMIB CHEMO SQ INJECTION 3.5 MG (2.5MG/ML)
1.5000 mg/m2 | Freq: Once | INTRAMUSCULAR | Status: AC
  Administered 2013-02-27: 2.75 mg via SUBCUTANEOUS
  Filled 2013-02-27: qty 2.75

## 2013-02-27 MED ORDER — ONDANSETRON HCL 8 MG PO TABS
8.0000 mg | ORAL_TABLET | Freq: Once | ORAL | Status: AC
  Administered 2013-02-27: 8 mg via ORAL

## 2013-02-27 NOTE — Patient Instructions (Addendum)
[facility] Discharge Instructions for Patients Receiving Chemotherapy  Today you received the following chemotherapy agents Velcade  To help prevent nausea and vomiting after your treatment, we encourage you to take your nausea medication zofran, compazine and lorazepam. Begin taking it at anytime upon discharge and take it as often as prescribed for the next 72 hours and as needed.   If you develop nausea and vomiting that is not controlled by your nausea medication, call the clinic. If it is after clinic hours your family physician or the after hours number for the clinic or go to the Emergency Department.   BELOW ARE SYMPTOMS THAT SHOULD BE REPORTED IMMEDIATELY:  *FEVER GREATER THAN 100.5 F  *CHILLS WITH OR WITHOUT FEVER  NAUSEA AND VOMITING THAT IS NOT CONTROLLED WITH YOUR NAUSEA MEDICATION  *UNUSUAL SHORTNESS OF BREATH  *UNUSUAL BRUISING OR BLEEDING  TENDERNESS IN MOUTH AND THROAT WITH OR WITHOUT PRESENCE OF ULCERS  *URINARY PROBLEMS  *BOWEL PROBLEMS  UNUSUAL RASH Items with * indicate a potential emergency and should be followed up as soon as possible.   Please call to let a nurse know about any problems that you may have experienced. Feel free to call the clinic you have any questions or concerns. The clinic phone number is [phone].   I have been informed and understand all the instructions given to me. I know to contact the clinic, my physician, or go to the Emergency Department if any problems should occur. I do not have any questions at this time, but understand that I may call the clinic during office hours   should I have any questions or need assistance in obtaining follow up care.    __________________________________________  _____________  __________ Signature of Patient or Authorized Representative            Date                   Time    __________________________________________ Nurse's Signature

## 2013-02-27 NOTE — Progress Notes (Signed)
Patient discharged at 1258, ambulatory in no distress

## 2013-03-01 ENCOUNTER — Other Ambulatory Visit: Payer: Self-pay | Admitting: Oncology

## 2013-03-06 ENCOUNTER — Ambulatory Visit: Payer: 59 | Admitting: Nutrition

## 2013-03-06 ENCOUNTER — Ambulatory Visit (HOSPITAL_BASED_OUTPATIENT_CLINIC_OR_DEPARTMENT_OTHER): Payer: 59

## 2013-03-06 ENCOUNTER — Telehealth: Payer: Self-pay | Admitting: Oncology

## 2013-03-06 ENCOUNTER — Other Ambulatory Visit (HOSPITAL_BASED_OUTPATIENT_CLINIC_OR_DEPARTMENT_OTHER): Payer: 59

## 2013-03-06 DIAGNOSIS — R803 Bence Jones proteinuria: Secondary | ICD-10-CM

## 2013-03-06 DIAGNOSIS — Z5112 Encounter for antineoplastic immunotherapy: Secondary | ICD-10-CM

## 2013-03-06 DIAGNOSIS — R809 Proteinuria, unspecified: Secondary | ICD-10-CM

## 2013-03-06 DIAGNOSIS — E8581 Light chain (AL) amyloidosis: Secondary | ICD-10-CM

## 2013-03-06 DIAGNOSIS — E8589 Other amyloidosis: Secondary | ICD-10-CM

## 2013-03-06 LAB — COMPREHENSIVE METABOLIC PANEL (CC13)
AST: 38 U/L — ABNORMAL HIGH (ref 5–34)
Alkaline Phosphatase: 88 U/L (ref 40–150)
BUN: 22.7 mg/dL (ref 7.0–26.0)
Calcium: 8.1 mg/dL — ABNORMAL LOW (ref 8.4–10.4)
Chloride: 104 mEq/L (ref 98–107)
Creatinine: 1 mg/dL (ref 0.7–1.3)

## 2013-03-06 LAB — CBC WITH DIFFERENTIAL/PLATELET
EOS%: 1.7 % (ref 0.0–7.0)
MCH: 30.8 pg (ref 27.2–33.4)
MCV: 87.6 fL (ref 79.3–98.0)
MONO%: 8.7 % (ref 0.0–14.0)
RBC: 4.19 10*6/uL — ABNORMAL LOW (ref 4.20–5.82)
RDW: 14.5 % (ref 11.0–14.6)

## 2013-03-06 MED ORDER — BORTEZOMIB CHEMO SQ INJECTION 3.5 MG (2.5MG/ML)
1.5000 mg/m2 | Freq: Once | INTRAMUSCULAR | Status: AC
  Administered 2013-03-06: 2.75 mg via SUBCUTANEOUS
  Filled 2013-03-06: qty 2.75

## 2013-03-06 MED ORDER — ONDANSETRON HCL 8 MG PO TABS
8.0000 mg | ORAL_TABLET | Freq: Once | ORAL | Status: AC
  Administered 2013-03-06: 8 mg via ORAL

## 2013-03-06 NOTE — Telephone Encounter (Signed)
Pt called and cancelled nuitrition consult  for 03/13/13

## 2013-03-06 NOTE — Progress Notes (Signed)
Patient is a 67 year old male diagnosed with AL Amyloidosis. He is a patient of Dr. Cyndie Chime.  Past medical history includes hypertension, mitral regurgitation, syncope.  Medications include vitamin D, Decadron, Lasix, multivitamin, omega-3 fatty acids, and vitamin E.  Labs include glucose of 121 and BUN 30.7.  Height 66 inches. Weight: 164 pounds per patient without leg brace. Usual body weight 190 pounds per patient without leg brace. BMI: 26.47.  Patient reports weight loss and anorexia. He has leg edema. 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 He is not able to exercise much because of this. He does consume small amounts of food 3 times a day but rarely snacks. He has been trying to increase snacks overall. He states he works long hours.  Nutrition diagnosis: Unintended weight loss related to inadequate oral intake as evidenced by 14% weight loss from usual body weight.  Intervention: Patient was educated to increase calories and protein in small amounts throughout the day. I have educated patient to set a reminder time when it is time to have a snack. He was encouraged to keep high-calorie high-protein snacks at work either at his desk for in the refrigerator severe related available. I've educated him on strategies for increasing calories in the foods that he does eat. I provided fact sheets and oral nutrition supplement samples as well as my contact information for him to take today. I've answered his questions. Teach back method used.  Monitoring, evaluation, goals: Patient will tolerate increased calories and protein in small frequent meals throughout the day to minimize further weight loss.  Next visit: Friday, April 4, during chemotherapy.

## 2013-03-09 LAB — KAPPA/LAMBDA LIGHT CHAINS
Kappa free light chain: 0.11 mg/dL — ABNORMAL LOW (ref 0.33–1.94)
Lambda Free Lght Chn: 8.56 mg/dL — ABNORMAL HIGH (ref 0.57–2.63)

## 2013-03-13 ENCOUNTER — Other Ambulatory Visit (HOSPITAL_BASED_OUTPATIENT_CLINIC_OR_DEPARTMENT_OTHER): Payer: 59 | Admitting: Lab

## 2013-03-13 ENCOUNTER — Ambulatory Visit (HOSPITAL_BASED_OUTPATIENT_CLINIC_OR_DEPARTMENT_OTHER): Payer: 59

## 2013-03-13 ENCOUNTER — Telehealth: Payer: Self-pay | Admitting: *Deleted

## 2013-03-13 ENCOUNTER — Telehealth: Payer: Self-pay | Admitting: Oncology

## 2013-03-13 ENCOUNTER — Encounter: Payer: 59 | Admitting: Nutrition

## 2013-03-13 ENCOUNTER — Ambulatory Visit (HOSPITAL_BASED_OUTPATIENT_CLINIC_OR_DEPARTMENT_OTHER): Payer: 59 | Admitting: Oncology

## 2013-03-13 VITALS — BP 110/55 | HR 104 | Temp 97.2°F | Resp 18 | Ht 66.0 in | Wt 170.7 lb

## 2013-03-13 DIAGNOSIS — E8589 Other amyloidosis: Secondary | ICD-10-CM

## 2013-03-13 DIAGNOSIS — Z5112 Encounter for antineoplastic immunotherapy: Secondary | ICD-10-CM

## 2013-03-13 DIAGNOSIS — E8581 Light chain (AL) amyloidosis: Secondary | ICD-10-CM

## 2013-03-13 DIAGNOSIS — R809 Proteinuria, unspecified: Secondary | ICD-10-CM

## 2013-03-13 DIAGNOSIS — R803 Bence Jones proteinuria: Secondary | ICD-10-CM

## 2013-03-13 DIAGNOSIS — I10 Essential (primary) hypertension: Secondary | ICD-10-CM

## 2013-03-13 LAB — CBC WITH DIFFERENTIAL/PLATELET
BASO%: 0.5 % (ref 0.0–2.0)
EOS%: 1.3 % (ref 0.0–7.0)
LYMPH%: 19 % (ref 14.0–49.0)
MCHC: 34.3 g/dL (ref 32.0–36.0)
MCV: 90.9 fL (ref 79.3–98.0)
MONO%: 12.2 % (ref 0.0–14.0)
Platelets: 116 10*3/uL — ABNORMAL LOW (ref 140–400)
RBC: 4.05 10*6/uL — ABNORMAL LOW (ref 4.20–5.82)
WBC: 7.8 10*3/uL (ref 4.0–10.3)

## 2013-03-13 MED ORDER — ONDANSETRON HCL 8 MG PO TABS
8.0000 mg | ORAL_TABLET | Freq: Once | ORAL | Status: AC
  Administered 2013-03-13: 8 mg via ORAL

## 2013-03-13 MED ORDER — BORTEZOMIB CHEMO SQ INJECTION 3.5 MG (2.5MG/ML)
1.5000 mg/m2 | Freq: Once | INTRAMUSCULAR | Status: AC
  Administered 2013-03-13: 2.75 mg via SUBCUTANEOUS
  Filled 2013-03-13: qty 2.75

## 2013-03-13 NOTE — Patient Instructions (Addendum)
[facility] Discharge Instructions for Patients Receiving Chemotherapy  Today you received the following chemotherapy agents velcade SQ  If you develop nausea and vomiting that is not controlled by your nausea medication, call the clinic. If it is after clinic hours your family physician or the after hours number for the clinic or go to the Emergency Department.   BELOW ARE SYMPTOMS THAT SHOULD BE REPORTED IMMEDIATELY:  *FEVER GREATER THAN 100.5 F  *CHILLS WITH OR WITHOUT FEVER  NAUSEA AND VOMITING THAT IS NOT CONTROLLED WITH YOUR NAUSEA MEDICATION  *UNUSUAL SHORTNESS OF BREATH  *UNUSUAL BRUISING OR BLEEDING  TENDERNESS IN MOUTH AND THROAT WITH OR WITHOUT PRESENCE OF ULCERS  *URINARY PROBLEMS  *BOWEL PROBLEMS  UNUSUAL RASH Items with * indicate a potential emergency and should be followed up as soon as possible.   Feel free to call the clinic you have any questions or concerns. The clinic phone number is [phone].   I have been informed and understand all the instructions given to me. I know to contact the clinic, my physician, or go to the Emergency Department if any problems should occur. I do not have any questions at this time, but understand that I may call the clinic during office hours   should I have any questions or need assistance in obtaining follow up care.    __________________________________________  _____________  __________ Signature of Patient or Authorized Representative            Date                   Time    __________________________________________ Nurse's Signature

## 2013-03-13 NOTE — Telephone Encounter (Signed)
Gave pt appt for lab, ML and chemo for May and April 2014

## 2013-03-13 NOTE — Telephone Encounter (Signed)
Per staff phone call and POF I have schedueld appts.  JMW  

## 2013-03-15 NOTE — Progress Notes (Signed)
Hematology and Oncology Follow Up Visit  Eugene Martinez [id number] 12-30-1945 67 y.o. 03/15/2013 9:41 AM   Principle Diagnosis: Encounter Diagnoses  Name Primary?  . AL amyloidosis Yes  . 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 Abdominal fat pad biopsy and bone marrow biopsies were not diagnostic. Diagnosis of amyloidosis established by kidney biopsy done 01/23/2013. Please see previous notes for full details of recent evaluations.  Subsequent to his visit here on March 7, I started him on a program of oral Cytoxan 300 mg per meter squared given weekly, oral dexamethasone 40 mg given weekly, and weekly subcutaneous Velcade 1.5 mg per meter squared. He is tolerating treatment very well so far. He has had no acute GI toxicities. He has had some mild transient paresthesias of his fingers. Appetite is still poor. He has had no interim fever or infection. We have not seen any significant myelosuppression from the drug so far.  There is a hint that even after just one cycle we are seeing a response with a approximate 50% reduction in serum lambda free light chains from a pretreatment value of 17.4 mg percent on March 7 2 most recent value of 8.6 on March 28 after being on treatment for only 2 weeks.  Medications: reviewed  Allergies:  Allergies  Allergen Reactions  . Other Other (See Comments)    Seasonal allergies/grass/trees/dust causes watery eyes, stuffiness, sometimes swelling.    Review of Systems: Constitutional:   See above Respiratory: No cough or dyspnea Cardiovascular:  No chest pain, no palpitations, no syncopal episodes Gastrointestinal: No change  in bowel habit Genito-Urinary: No urinary tract symptoms Musculoskeletal: No muscle bone or joint pain Neurologic: No headache or change in vision Skin: No rash or ecchymosis Remaining ROS negative.  Physical Exam: Blood pressure 110/55, pulse 104, temperature 97.2 F (36.2 C), temperature source Oral, resp. rate 18, height 5\' 6"  (1.676 m), weight 170 lb 11.2 oz (77.429 kg). Wt Readings from Last 3 Encounters:  03/13/13 170 lb 11.2 oz (77.429 kg)  02/20/13 170 lb 6.4 oz (77.293 kg)  02/13/13 168 lb 9.6 oz (76.476 kg)     General appearance: Thin Caucasian man HENNT: Pharynx no erythema or exudate. No ulcers Lymph nodes: No adenopathy Breasts: Lungs: Clear to auscultation resonant to percussion Heart: Regular rhythm no murmur or gallop Abdomen: Soft, nontender, no mass, no organomegaly Extremities: 2+ edema Vascular: No cyanosis Neurologic: He wears a right leg brace from applications of childhood polio. Motor strength grossly normal reflexes absent symmetric at the knees 1+ symmetric at the biceps. Mild decreased vibration by tuning fork exam over the fingertips Skin: No rash or ecchymosis  Lab Results: Lab Results  Component Value Date   WBC 7.8 03/13/2013   HGB 12.6* 03/13/2013   HCT 36.8* 03/13/2013   MCV 90.9 03/13/2013   PLT 116* 03/13/2013     Chemistry      Component Value Date/Time   NA 137 03/06/2013 1331   NA 135 02/20/2013 1157   K 4.0 03/06/2013 1331   K 3.9 02/20/2013 1157   CL 104 03/06/2013 1331   CL 103 02/20/2013 1157   CO2 25 03/06/2013 1331   CO2 27 02/20/2013 1157   BUN 22.7  03/06/2013 1331   BUN 30* 02/20/2013 1157   CREATININE 1.0 03/06/2013 1331   CREATININE 1.1 02/20/2013 1157   CREATININE 0.93 12/15/2012 0832      Component Value Date/Time   CALCIUM 8.1* 03/06/2013 1331   CALCIUM 8.2* 02/20/2013 1157   ALKPHOS 88 03/06/2013 1331   ALKPHOS 80 01/21/2013 0932   AST 38* 03/06/2013 1331   AST 22 01/21/2013 0932   ALT 93 Repeated and Verified* 03/06/2013 1331   ALT  18 01/21/2013 0932   BILITOT 0.63 03/06/2013 1331   BILITOT 0.8 01/21/2013 0932       Radiological Studies: No results found.  Impression and Plan: #1. AL amyloidosis He already appears to be responding to treatment started last month. I will continue the current program of Cytoxan, Velcade, dexamethasone. I will initiate a referral to [facility] bone marrow transplant center. I very discussed this case with Dr. Barbaraann Boys  #2. Essential hypertension Hard to sort out contribution of potential amyloid cardiac involvement at this time. He has a preserved ejection fraction. He had a marked elevation of his BNP when admitted to the hospital back in November. Troponins however were normal. Will continue to follow along with his cardiologist..   CC:. Dr. Cassell Clement; Dr. Marcello Fennel; Dr. Dierdre Highman; Dr. Garth Bigness at [facility], MD 4/6/20149:41 AM

## 2013-03-25 ENCOUNTER — Ambulatory Visit (HOSPITAL_BASED_OUTPATIENT_CLINIC_OR_DEPARTMENT_OTHER): Payer: 59

## 2013-03-25 ENCOUNTER — Other Ambulatory Visit: Payer: Self-pay | Admitting: Oncology

## 2013-03-25 ENCOUNTER — Other Ambulatory Visit: Payer: 59 | Admitting: Lab

## 2013-03-25 VITALS — BP 105/66 | HR 95 | Temp 98.0°F | Resp 18

## 2013-03-25 DIAGNOSIS — R809 Proteinuria, unspecified: Secondary | ICD-10-CM

## 2013-03-25 DIAGNOSIS — Z5112 Encounter for antineoplastic immunotherapy: Secondary | ICD-10-CM

## 2013-03-25 DIAGNOSIS — E8581 Light chain (AL) amyloidosis: Secondary | ICD-10-CM

## 2013-03-25 DIAGNOSIS — E8589 Other amyloidosis: Secondary | ICD-10-CM

## 2013-03-25 DIAGNOSIS — R803 Bence Jones proteinuria: Secondary | ICD-10-CM

## 2013-03-25 LAB — CBC WITH DIFFERENTIAL/PLATELET
EOS%: 2 % (ref 0.0–7.0)
Eosinophils Absolute: 0.2 10*3/uL (ref 0.0–0.5)
LYMPH%: 15.3 % (ref 14.0–49.0)
MCH: 31 pg (ref 27.2–33.4)
MCHC: 33.7 g/dL (ref 32.0–36.0)
MCV: 92.1 fL (ref 79.3–98.0)
MONO%: 12.5 % (ref 0.0–14.0)
Platelets: 148 10*3/uL (ref 140–400)
RBC: 3.88 10*6/uL — ABNORMAL LOW (ref 4.20–5.82)
RDW: 16.8 % — ABNORMAL HIGH (ref 11.0–14.6)

## 2013-03-25 MED ORDER — BORTEZOMIB CHEMO SQ INJECTION 3.5 MG (2.5MG/ML)
1.5000 mg/m2 | Freq: Once | INTRAMUSCULAR | Status: AC
  Administered 2013-03-25: 2.75 mg via SUBCUTANEOUS
  Filled 2013-03-25: qty 2.75

## 2013-03-25 MED ORDER — ONDANSETRON HCL 8 MG PO TABS
8.0000 mg | ORAL_TABLET | Freq: Once | ORAL | Status: AC
  Administered 2013-03-25: 8 mg via ORAL

## 2013-03-25 NOTE — Patient Instructions (Addendum)
[facility] Discharge Instructions for Patients Receiving Chemotherapy  Today you received the following chemotherapy agents Velcade.  To help prevent nausea and vomiting after your treatment, we encourage you to take your nausea medication as prescribed.   If you develop nausea and vomiting that is not controlled by your nausea medication, call the clinic. If it is after clinic hours your family physician or the after hours number for the clinic or go to the Emergency Department.   BELOW ARE SYMPTOMS THAT SHOULD BE REPORTED IMMEDIATELY:  *FEVER GREATER THAN 100.5 F  *CHILLS WITH OR WITHOUT FEVER  NAUSEA AND VOMITING THAT IS NOT CONTROLLED WITH YOUR NAUSEA MEDICATION  *UNUSUAL SHORTNESS OF BREATH  *UNUSUAL BRUISING OR BLEEDING  TENDERNESS IN MOUTH AND THROAT WITH OR WITHOUT PRESENCE OF ULCERS  *URINARY PROBLEMS  *BOWEL PROBLEMS  UNUSUAL RASH Items with * indicate a potential emergency and should be followed up as soon as possible.  One of the nurses will contact you 24 hours after your treatment. Please let the nurse know about any problems that you may have experienced. Feel free to call the clinic you have any questions or concerns. The clinic phone number is [phone].   I have been informed and understand all the instructions given to me. I know to contact the clinic, my physician, or go to the Emergency Department if any problems should occur. I do not have any questions at this time, but understand that I may call the clinic during office hours   should I have any questions or need assistance in obtaining follow up care.    __________________________________________  _____________  __________ Signature of Patient or Authorized Representative            Date                   Time    __________________________________________ Nurse's Signature    

## 2013-03-30 ENCOUNTER — Telehealth: Payer: Self-pay | Admitting: Oncology

## 2013-03-30 NOTE — Telephone Encounter (Signed)
Pt appt. With Dr. Barbaraann Boys @ [facility] 04/15/13@10 :00.  Faxed medical records and slide will be fedex'ed. Pt is aware.

## 2013-04-02 ENCOUNTER — Telehealth: Payer: Self-pay | Admitting: Cardiology

## 2013-04-02 ENCOUNTER — Telehealth: Payer: Self-pay | Admitting: *Deleted

## 2013-04-02 NOTE — Telephone Encounter (Signed)
Received call from pt stating that scrotum is swollen bilaterally along with legs.  He thinks this is b/c of fluid & it has happened before & he has taken lasix & it has gone down.  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  He describes pitting edema & states that it sometimes goes down at hs after elevation of legs but comes back quickly in the am.  He is on lasix 20 mg daily but doubled this am & states that Dr. Patty Sermons has been OK with him doing this.  He did this this am b/c he could hardly walk this am due to the swelling.  He also states that he will have a change in his insurance in May & wanted to know what to do.  Informed to tell the front desk when he comes in tomorrow.  Discussed with Lonna Cobb NP & encouraged pt to discuss with Dr. Patty Sermons since he is prescribing the lasix.  Discussed elevation of legs as much as possible.

## 2013-04-02 NOTE — Telephone Encounter (Signed)
Patient c/o swelling and edema. Does take Lasix 20 mg daily and took 2 today. Advised patient  Dr. Patty Sermons not in the office until next week, he will call PCP. Spoke with patient earlier today, called to follow up and no answer. Will try tomorrow

## 2013-04-02 NOTE — Telephone Encounter (Signed)
New problem    Pt has excess fluid and not sure if he needs to come here or see his pcp

## 2013-04-03 ENCOUNTER — Ambulatory Visit (HOSPITAL_BASED_OUTPATIENT_CLINIC_OR_DEPARTMENT_OTHER): Payer: 59

## 2013-04-03 ENCOUNTER — Other Ambulatory Visit (HOSPITAL_BASED_OUTPATIENT_CLINIC_OR_DEPARTMENT_OTHER): Payer: 59

## 2013-04-03 VITALS — BP 108/71 | HR 108 | Temp 97.8°F | Resp 18

## 2013-04-03 DIAGNOSIS — E8589 Other amyloidosis: Secondary | ICD-10-CM

## 2013-04-03 DIAGNOSIS — Z5112 Encounter for antineoplastic immunotherapy: Secondary | ICD-10-CM

## 2013-04-03 DIAGNOSIS — R803 Bence Jones proteinuria: Secondary | ICD-10-CM

## 2013-04-03 DIAGNOSIS — E8581 Light chain (AL) amyloidosis: Secondary | ICD-10-CM

## 2013-04-03 DIAGNOSIS — R809 Proteinuria, unspecified: Secondary | ICD-10-CM

## 2013-04-03 LAB — CBC WITH DIFFERENTIAL/PLATELET
BASO%: 0.2 % (ref 0.0–2.0)
LYMPH%: 6.7 % — ABNORMAL LOW (ref 14.0–49.0)
MCHC: 34.3 g/dL (ref 32.0–36.0)
MONO#: 1.2 10*3/uL — ABNORMAL HIGH (ref 0.1–0.9)
MONO%: 12.2 % (ref 0.0–14.0)
Platelets: 118 10*3/uL — ABNORMAL LOW (ref 140–400)
RBC: 3.78 10*6/uL — ABNORMAL LOW (ref 4.20–5.82)
RDW: 16.8 % — ABNORMAL HIGH (ref 11.0–14.6)
WBC: 10 10*3/uL (ref 4.0–10.3)

## 2013-04-03 MED ORDER — BORTEZOMIB CHEMO SQ INJECTION 3.5 MG (2.5MG/ML)
1.5000 mg/m2 | Freq: Once | INTRAMUSCULAR | Status: AC
  Administered 2013-04-03: 2.75 mg via SUBCUTANEOUS
  Filled 2013-04-03: qty 2.75

## 2013-04-03 MED ORDER — ONDANSETRON HCL 8 MG PO TABS
8.0000 mg | ORAL_TABLET | Freq: Once | ORAL | Status: AC
  Administered 2013-04-03: 8 mg via ORAL

## 2013-04-03 NOTE — Patient Instructions (Addendum)
[facility] Discharge Instructions for Patients Receiving Chemotherapy  Today you received the following chemotherapy agents Velcade.  To help prevent nausea and vomiting after your treatment, we encourage you to take your nausea medication as prescribed.   If you develop nausea and vomiting that is not controlled by your nausea medication, call the clinic. If it is after clinic hours your family physician or the after hours number for the clinic or go to the Emergency Department.   BELOW ARE SYMPTOMS THAT SHOULD BE REPORTED IMMEDIATELY:  *FEVER GREATER THAN 100.5 F  *CHILLS WITH OR WITHOUT FEVER  NAUSEA AND VOMITING THAT IS NOT CONTROLLED WITH YOUR NAUSEA MEDICATION  *UNUSUAL SHORTNESS OF BREATH  *UNUSUAL BRUISING OR BLEEDING  TENDERNESS IN MOUTH AND THROAT WITH OR WITHOUT PRESENCE OF ULCERS  *URINARY PROBLEMS  *BOWEL PROBLEMS  UNUSUAL RASH Items with * indicate a potential emergency and should be followed up as soon as possible.  Feel free to call the clinic you have any questions or concerns. The clinic phone number is [phone].   I have been informed and understand all the instructions given to me. I know to contact the clinic, my physician, or go to the Emergency Department if any problems should occur. I do not have any questions at this time, but understand that I may call the clinic during office hours   should I have any questions or need assistance in obtaining follow up care.    __________________________________________  _____________  __________ Signature of Patient or Authorized Representative            Date                   Time    __________________________________________ Nurse's Signature    

## 2013-04-03 NOTE — Progress Notes (Signed)
Patient with red rash to bilateral lower extremities, some swelling noted. Patient denies pain. Seen by Paulino Door. Ok for treatment.

## 2013-04-03 NOTE — Telephone Encounter (Signed)
Spoke to patient and he did see PCP yesterday, increased Lasix to 40 mg twice a day. Patient weight down 9 pounds overnight. Per patient PCP wanted to let  Dr. Patty Sermons decide on dose of Lasix. Will forward to  Dr. Patty Sermons for review

## 2013-04-08 NOTE — Telephone Encounter (Signed)
Suggest that he take 40 mg in the morning and 20 mg in the evening. Monitor weight and BP daily at home. ?see in several weeks for OV and BMET?  Not sure when his next appointment is .

## 2013-04-10 ENCOUNTER — Other Ambulatory Visit: Payer: Self-pay | Admitting: Nurse Practitioner

## 2013-04-10 ENCOUNTER — Other Ambulatory Visit (HOSPITAL_BASED_OUTPATIENT_CLINIC_OR_DEPARTMENT_OTHER): Payer: BC Managed Care – PPO | Admitting: Lab

## 2013-04-10 ENCOUNTER — Ambulatory Visit (HOSPITAL_BASED_OUTPATIENT_CLINIC_OR_DEPARTMENT_OTHER): Payer: BC Managed Care – PPO

## 2013-04-10 ENCOUNTER — Encounter: Payer: Self-pay | Admitting: Oncology

## 2013-04-10 VITALS — BP 94/55 | HR 85 | Temp 97.0°F | Resp 18

## 2013-04-10 DIAGNOSIS — E8589 Other amyloidosis: Secondary | ICD-10-CM

## 2013-04-10 DIAGNOSIS — R803 Bence Jones proteinuria: Secondary | ICD-10-CM

## 2013-04-10 DIAGNOSIS — R809 Proteinuria, unspecified: Secondary | ICD-10-CM

## 2013-04-10 DIAGNOSIS — E8581 Light chain (AL) amyloidosis: Secondary | ICD-10-CM

## 2013-04-10 DIAGNOSIS — Z5112 Encounter for antineoplastic immunotherapy: Secondary | ICD-10-CM

## 2013-04-10 LAB — CBC WITH DIFFERENTIAL/PLATELET
BASO%: 0.4 % (ref 0.0–2.0)
Basophils Absolute: 0 10*3/uL (ref 0.0–0.1)
Eosinophils Absolute: 0.1 10*3/uL (ref 0.0–0.5)
HCT: 36.6 % — ABNORMAL LOW (ref 38.4–49.9)
HGB: 12.3 g/dL — ABNORMAL LOW (ref 13.0–17.1)
MONO#: 0.9 10*3/uL (ref 0.1–0.9)
NEUT%: 76.5 % — ABNORMAL HIGH (ref 39.0–75.0)
WBC: 7.5 10*3/uL (ref 4.0–10.3)
lymph#: 0.7 10*3/uL — ABNORMAL LOW (ref 0.9–3.3)

## 2013-04-10 LAB — COMPREHENSIVE METABOLIC PANEL (CC13)
ALT: 28 U/L (ref 0–55)
BUN: 24 mg/dL (ref 7.0–26.0)
CO2: 29 mEq/L (ref 22–29)
Calcium: 8.2 mg/dL — ABNORMAL LOW (ref 8.4–10.4)
Chloride: 98 mEq/L (ref 98–107)
Creatinine: 0.8 mg/dL (ref 0.7–1.3)

## 2013-04-10 LAB — LACTATE DEHYDROGENASE (CC13): LDH: 235 U/L (ref 125–245)

## 2013-04-10 MED ORDER — BORTEZOMIB CHEMO SQ INJECTION 3.5 MG (2.5MG/ML)
1.5000 mg/m2 | Freq: Once | INTRAMUSCULAR | Status: AC
  Administered 2013-04-10: 2.75 mg via SUBCUTANEOUS
  Filled 2013-04-10: qty 2.75

## 2013-04-10 MED ORDER — ONDANSETRON HCL 8 MG PO TABS
8.0000 mg | ORAL_TABLET | Freq: Once | ORAL | Status: AC
  Administered 2013-04-10: 8 mg via ORAL

## 2013-04-10 NOTE — Patient Instructions (Signed)
[facility] Discharge Instructions for Patients Receiving Chemotherapy  Today you received the following chemotherapy agents Velcade  To help prevent nausea and vomiting after your treatment, we encourage you to take your nausea medication as directed.   If you develop nausea and vomiting that is not controlled by your nausea medication, call the clinic. If it is after clinic hours your family physician or the after hours number for the clinic or go to the Emergency Department.   BELOW ARE SYMPTOMS THAT SHOULD BE REPORTED IMMEDIATELY:  *FEVER GREATER THAN 100.5 F  *CHILLS WITH OR WITHOUT FEVER  NAUSEA AND VOMITING THAT IS NOT CONTROLLED WITH YOUR NAUSEA MEDICATION  *UNUSUAL SHORTNESS OF BREATH  *UNUSUAL BRUISING OR BLEEDING  TENDERNESS IN MOUTH AND THROAT WITH OR WITHOUT PRESENCE OF ULCERS  *URINARY PROBLEMS  *BOWEL PROBLEMS  UNUSUAL RASH Items with * indicate a potential emergency and should be followed up as soon as possible.  One of the nurses will contact you 24 hours after your treatment. Please let the nurse know about any problems that you may have experienced. Feel free to call the clinic you have any questions or concerns. The clinic phone number is [phone].   I have been informed and understand all the instructions given to me. I know to contact the clinic, my physician, or go to the Emergency Department if any problems should occur. I do not have any questions at this time, but understand that I may call the clinic during office hours   should I have any questions or need assistance in obtaining follow up care.    __________________________________________  _____________  __________ Signature of Patient or Authorized Representative            Date                   Time    __________________________________________ Nurse's Signature    

## 2013-04-14 LAB — PROTEIN ELECTROPHORESIS, SERUM
Alpha-1-Globulin: 6.8 % — ABNORMAL HIGH (ref 2.9–4.9)
Gamma Globulin: 4.7 % — ABNORMAL LOW (ref 11.1–18.8)

## 2013-04-14 LAB — KAPPA/LAMBDA LIGHT CHAINS
Kappa free light chain: 0.27 mg/dL — ABNORMAL LOW (ref 0.33–1.94)
Lambda Free Lght Chn: 0.87 mg/dL (ref 0.57–2.63)

## 2013-04-15 ENCOUNTER — Other Ambulatory Visit: Payer: Self-pay | Admitting: Oncology

## 2013-04-15 DIAGNOSIS — E8581 Light chain (AL) amyloidosis: Secondary | ICD-10-CM

## 2013-04-16 ENCOUNTER — Telehealth: Payer: Self-pay | Admitting: *Deleted

## 2013-04-16 NOTE — Telephone Encounter (Signed)
Received vm call from pt stating that he was at [facility] & saw Dr Mellody Memos & she was going to talk to Dr Cyndie Chime about setting up more chemo.  He reports that he has an appt tomorrow for lab & Lonna Cobb NP & wonders if chemo can be scheduled for tomorrow.  Call back # is [phone].

## 2013-04-17 ENCOUNTER — Ambulatory Visit (HOSPITAL_BASED_OUTPATIENT_CLINIC_OR_DEPARTMENT_OTHER): Payer: BC Managed Care – PPO | Admitting: Nurse Practitioner

## 2013-04-17 ENCOUNTER — Telehealth: Payer: Self-pay | Admitting: Oncology

## 2013-04-17 ENCOUNTER — Other Ambulatory Visit: Payer: Self-pay | Admitting: Oncology

## 2013-04-17 ENCOUNTER — Other Ambulatory Visit (HOSPITAL_BASED_OUTPATIENT_CLINIC_OR_DEPARTMENT_OTHER): Payer: BC Managed Care – PPO

## 2013-04-17 ENCOUNTER — Ambulatory Visit (HOSPITAL_BASED_OUTPATIENT_CLINIC_OR_DEPARTMENT_OTHER): Payer: BC Managed Care – PPO

## 2013-04-17 VITALS — BP 107/63 | HR 99 | Temp 96.8°F | Resp 17 | Ht 66.0 in | Wt 157.9 lb

## 2013-04-17 DIAGNOSIS — E8581 Light chain (AL) amyloidosis: Secondary | ICD-10-CM

## 2013-04-17 DIAGNOSIS — E8589 Other amyloidosis: Secondary | ICD-10-CM

## 2013-04-17 DIAGNOSIS — I10 Essential (primary) hypertension: Secondary | ICD-10-CM

## 2013-04-17 DIAGNOSIS — R809 Proteinuria, unspecified: Secondary | ICD-10-CM

## 2013-04-17 DIAGNOSIS — Z5112 Encounter for antineoplastic immunotherapy: Secondary | ICD-10-CM

## 2013-04-17 DIAGNOSIS — R803 Bence Jones proteinuria: Secondary | ICD-10-CM

## 2013-04-17 LAB — CBC WITH DIFFERENTIAL/PLATELET
Basophils Absolute: 0 10*3/uL (ref 0.0–0.1)
Eosinophils Absolute: 0.1 10*3/uL (ref 0.0–0.5)
HCT: 33.3 % — ABNORMAL LOW (ref 38.4–49.9)
HGB: 11.2 g/dL — ABNORMAL LOW (ref 13.0–17.1)
MONO#: 1 10*3/uL — ABNORMAL HIGH (ref 0.1–0.9)
NEUT#: 6 10*3/uL (ref 1.5–6.5)
NEUT%: 76 % — ABNORMAL HIGH (ref 39.0–75.0)
RDW: 17.2 % — ABNORMAL HIGH (ref 11.0–14.6)
lymph#: 0.7 10*3/uL — ABNORMAL LOW (ref 0.9–3.3)

## 2013-04-17 MED ORDER — BORTEZOMIB CHEMO SQ INJECTION 3.5 MG (2.5MG/ML)
1.5000 mg/m2 | Freq: Once | INTRAMUSCULAR | Status: AC
  Administered 2013-04-17: 2.75 mg via SUBCUTANEOUS
  Filled 2013-04-17: qty 2.75

## 2013-04-17 MED ORDER — ONDANSETRON HCL 8 MG PO TABS
8.0000 mg | ORAL_TABLET | Freq: Once | ORAL | Status: AC
  Administered 2013-04-17: 8 mg via ORAL

## 2013-04-17 NOTE — Telephone Encounter (Signed)
gv and printed appt sched and avs for May and June...MB add tx

## 2013-04-17 NOTE — Progress Notes (Signed)
OFFICE PROGRESS NOTE  Interval history:  Mr. 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 He was started on Cytoxan 300 mg per meter squared weekly, dexamethasone 40 mg weekly and subcutaneous Velcade 1.5 mg per meter squared weekly beginning 02/20/2013.  Most recent serum light chain analysis on 04/10/2013 showed further improvement in the lambda free light chains at 0.87 as compared to 8.56 on 03/06/2013 and 17.4 on 02/13/2013.  He feels he is tolerating the treatment well. He denies nausea/vomiting. No mouth sores. No diarrhea. Last week he had some loose stools after taking a stool softener. Leg edema is significantly better since increasing his Lasix dose. He states the swelling "comes and goes". He remains very active. He intermittently notes mild dyspnea on exertion. No chest pain.   Objective: Blood pressure 107/63, pulse 99, temperature 96.8 F (36 C), temperature source Oral, resp. rate 17, height 5\' 6"  (1.676 m), weight 157 lb 14.4 oz (71.623 kg).  No thrush or ulcerations. No palpable cervical or supraclavicular lymph nodes. Lungs are clear. No wheezes or rales. Regular cardiac rhythm. Question faint systolic murmur. Abdomen is soft and nontender. No organomegaly. 2-3+ pitting leg edema. Faint pink discoloration over the pretibial regions. Motor strength 5 over 5. Vibratory sense is mildly decreased over the fingertips per tuning fork exam.  Lab Results: Lab Results  Component Value Date   WBC 7.8 04/17/2013   HGB 11.2* 04/17/2013   HCT 33.3* 04/17/2013   MCV 92.5 04/17/2013   PLT 107* 04/17/2013    Chemistry:    Chemistry      Component Value Date/Time   NA 135* 04/10/2013 0904   NA 135 02/20/2013 1157   K 3.9 04/10/2013 0904   K 3.9 02/20/2013 1157   CL 98 04/10/2013 0904   CL 103 02/20/2013 1157   CO2 29 04/10/2013 0904   CO2 27 02/20/2013 1157   BUN 24.0 04/10/2013 0904   BUN 30* 02/20/2013 1157   CREATININE 0.8 04/10/2013 0904   CREATININE 1.1  02/20/2013 1157   CREATININE 0.93 12/15/2012 0832      Component Value Date/Time   CALCIUM 8.2* 04/10/2013 0904   CALCIUM 8.2* 02/20/2013 1157   ALKPHOS 71 04/10/2013 0904   ALKPHOS 80 01/21/2013 0932   AST 22 04/10/2013 0904   AST 22 01/21/2013 0932   ALT 28 04/10/2013 0904   ALT 18 01/21/2013 0932   BILITOT 0.80 04/10/2013 0904   BILITOT 0.8 01/21/2013 0932       Studies/Results: No results found.  Medications: I have reviewed the patient's current medications.  Assessment/Plan:  1. 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 He has been evaluated by Dr.Gasparetto at [facility] 2. Essential hypertension.  Disposition-Mr. Oley appears to be tolerating treatment well. Recent serum light chain analysis showed continued improvement. Plan to continue Cytoxan/Velcade/dexamethasone on the current schedule. He is scheduled to return to [facility] on 05/13/2013. He will return for a followup visit here on 05/22/2013. He will contact the office in the interim with any problems.  Plan reviewed with Dr. Cyndie Chime.  Lonna Cobb ANP/GNP-BC    CC Dr. Cassell Clement, Dr. Marcello Fennel, Dr. Dierdre Highman, Dr. Garth Bigness at [facility].

## 2013-04-17 NOTE — Patient Instructions (Addendum)
[facility] Discharge Instructions for Patients Receiving Chemotherapy  Today you received the following chemotherapy agents:  Velcade  To help prevent nausea and vomiting after your treatment, we encourage you to take your nausea medication as ordered per MD.    If you develop nausea and vomiting that is not controlled by your nausea medication, call the clinic. If it is after clinic hours your family physician or the after hours number for the clinic or go to the Emergency Department.   BELOW ARE SYMPTOMS THAT SHOULD BE REPORTED IMMEDIATELY:  *FEVER GREATER THAN 100.5 F  *CHILLS WITH OR WITHOUT FEVER  NAUSEA AND VOMITING THAT IS NOT CONTROLLED WITH YOUR NAUSEA MEDICATION  *UNUSUAL SHORTNESS OF BREATH  *UNUSUAL BRUISING OR BLEEDING  TENDERNESS IN MOUTH AND THROAT WITH OR WITHOUT PRESENCE OF ULCERS  *URINARY PROBLEMS  *BOWEL PROBLEMS  UNUSUAL RASH Items with * indicate a potential emergency and should be followed up as soon as possible.   Please let the nurse know about any problems that you may have experienced. Feel free to call the clinic you have any questions or concerns. The clinic phone number is [phone].   I have been informed and understand all the instructions given to me. I know to contact the clinic, my physician, or go to the Emergency Department if any problems should occur. I do not have any questions at this time, but understand that I may call the clinic during office hours   should I have any questions or need assistance in obtaining follow up care.    __________________________________________  _____________  __________ Signature of Patient or Authorized Representative            Date                   Time    __________________________________________ Nurse's Signature    

## 2013-04-22 ENCOUNTER — Telehealth: Payer: Self-pay | Admitting: *Deleted

## 2013-04-22 NOTE — Telephone Encounter (Signed)
Returned Chris's call and read script for Cytoxan from 02/13/13.

## 2013-04-24 ENCOUNTER — Other Ambulatory Visit (HOSPITAL_BASED_OUTPATIENT_CLINIC_OR_DEPARTMENT_OTHER): Payer: BC Managed Care – PPO | Admitting: Lab

## 2013-04-24 ENCOUNTER — Ambulatory Visit (HOSPITAL_BASED_OUTPATIENT_CLINIC_OR_DEPARTMENT_OTHER): Payer: BC Managed Care – PPO

## 2013-04-24 VITALS — BP 101/61 | HR 90 | Temp 97.4°F | Resp 18

## 2013-04-24 DIAGNOSIS — E8581 Light chain (AL) amyloidosis: Secondary | ICD-10-CM

## 2013-04-24 DIAGNOSIS — R809 Proteinuria, unspecified: Secondary | ICD-10-CM

## 2013-04-24 DIAGNOSIS — Z5112 Encounter for antineoplastic immunotherapy: Secondary | ICD-10-CM

## 2013-04-24 DIAGNOSIS — E8589 Other amyloidosis: Secondary | ICD-10-CM

## 2013-04-24 DIAGNOSIS — R803 Bence Jones proteinuria: Secondary | ICD-10-CM

## 2013-04-24 LAB — CBC WITH DIFFERENTIAL/PLATELET
Basophils Absolute: 0 10*3/uL (ref 0.0–0.1)
Eosinophils Absolute: 0.1 10*3/uL (ref 0.0–0.5)
HCT: 32 % — ABNORMAL LOW (ref 38.4–49.9)
HGB: 11.1 g/dL — ABNORMAL LOW (ref 13.0–17.1)
LYMPH%: 9.5 % — ABNORMAL LOW (ref 14.0–49.0)
MCH: 31.9 pg (ref 27.2–33.4)
MCV: 92.2 fL (ref 79.3–98.0)
MONO%: 16.6 % — ABNORMAL HIGH (ref 0.0–14.0)
NEUT#: 4.6 10*3/uL (ref 1.5–6.5)
NEUT%: 72.6 % (ref 39.0–75.0)
Platelets: 99 10*3/uL — ABNORMAL LOW (ref 140–400)
RDW: 17.9 % — ABNORMAL HIGH (ref 11.0–14.6)

## 2013-04-24 MED ORDER — ONDANSETRON HCL 8 MG PO TABS
8.0000 mg | ORAL_TABLET | Freq: Once | ORAL | Status: AC
  Administered 2013-04-24: 8 mg via ORAL

## 2013-04-24 MED ORDER — BORTEZOMIB CHEMO SQ INJECTION 3.5 MG (2.5MG/ML)
1.5000 mg/m2 | Freq: Once | INTRAMUSCULAR | Status: AC
  Administered 2013-04-24: 2.75 mg via SUBCUTANEOUS
  Filled 2013-04-24: qty 2.75

## 2013-04-24 NOTE — Progress Notes (Signed)
0905-OK to treat with platelet count of 99 per Dr. Truett Perna.

## 2013-04-24 NOTE — Patient Instructions (Addendum)
[facility] Discharge Instructions for Patients Receiving Chemotherapy  Today you received the following chemotherapy agents:  Velcade  To help prevent nausea and vomiting after your treatment, we encourage you to take your nausea medication as ordered per MD.    If you develop nausea and vomiting that is not controlled by your nausea medication, call the clinic. If it is after clinic hours your family physician or the after hours number for the clinic or go to the Emergency Department.   BELOW ARE SYMPTOMS THAT SHOULD BE REPORTED IMMEDIATELY:  *FEVER GREATER THAN 100.5 F  *CHILLS WITH OR WITHOUT FEVER  NAUSEA AND VOMITING THAT IS NOT CONTROLLED WITH YOUR NAUSEA MEDICATION  *UNUSUAL SHORTNESS OF BREATH  *UNUSUAL BRUISING OR BLEEDING  TENDERNESS IN MOUTH AND THROAT WITH OR WITHOUT PRESENCE OF ULCERS  *URINARY PROBLEMS  *BOWEL PROBLEMS  UNUSUAL RASH Items with * indicate a potential emergency and should be followed up as soon as possible.   Please let the nurse know about any problems that you may have experienced. Feel free to call the clinic you have any questions or concerns. The clinic phone number is [phone].   I have been informed and understand all the instructions given to me. I know to contact the clinic, my physician, or go to the Emergency Department if any problems should occur. I do not have any questions at this time, but understand that I may call the clinic during office hours   should I have any questions or need assistance in obtaining follow up care.    __________________________________________  _____________  __________ Signature of Patient or Authorized Representative            Date                   Time    __________________________________________ Nurse's Signature    

## 2013-04-30 ENCOUNTER — Other Ambulatory Visit (HOSPITAL_COMMUNITY): Payer: Self-pay | Admitting: Oncology

## 2013-05-01 ENCOUNTER — Other Ambulatory Visit (HOSPITAL_BASED_OUTPATIENT_CLINIC_OR_DEPARTMENT_OTHER): Payer: BC Managed Care – PPO

## 2013-05-01 ENCOUNTER — Ambulatory Visit (HOSPITAL_BASED_OUTPATIENT_CLINIC_OR_DEPARTMENT_OTHER): Payer: BC Managed Care – PPO

## 2013-05-01 VITALS — BP 105/58 | HR 103 | Temp 96.7°F

## 2013-05-01 DIAGNOSIS — E8589 Other amyloidosis: Secondary | ICD-10-CM

## 2013-05-01 DIAGNOSIS — Z5112 Encounter for antineoplastic immunotherapy: Secondary | ICD-10-CM

## 2013-05-01 DIAGNOSIS — R809 Proteinuria, unspecified: Secondary | ICD-10-CM

## 2013-05-01 DIAGNOSIS — E8581 Light chain (AL) amyloidosis: Secondary | ICD-10-CM

## 2013-05-01 DIAGNOSIS — R803 Bence Jones proteinuria: Secondary | ICD-10-CM

## 2013-05-01 LAB — CBC WITH DIFFERENTIAL/PLATELET
EOS%: 1.1 % (ref 0.0–7.0)
MCH: 31.9 pg (ref 27.2–33.4)
MCV: 92.7 fL (ref 79.3–98.0)
MONO%: 19.5 % — ABNORMAL HIGH (ref 0.0–14.0)
RBC: 3.57 10*6/uL — ABNORMAL LOW (ref 4.20–5.82)
RDW: 18.4 % — ABNORMAL HIGH (ref 11.0–14.6)

## 2013-05-01 MED ORDER — BORTEZOMIB CHEMO SQ INJECTION 3.5 MG (2.5MG/ML)
1.5000 mg/m2 | Freq: Once | INTRAMUSCULAR | Status: AC
  Administered 2013-05-01: 2.75 mg via SUBCUTANEOUS
  Filled 2013-05-01: qty 2.75

## 2013-05-01 MED ORDER — ONDANSETRON HCL 8 MG PO TABS
8.0000 mg | ORAL_TABLET | Freq: Once | ORAL | Status: AC
  Administered 2013-05-01: 8 mg via ORAL

## 2013-05-01 NOTE — Patient Instructions (Addendum)
[facility] Discharge Instructions for Patients Receiving Chemotherapy  Today you received the following chemotherapy agents:  Velcade  To help prevent nausea and vomiting after your treatment, we encourage you to take your nausea medication as ordered per MD.    If you develop nausea and vomiting that is not controlled by your nausea medication, call the clinic. If it is after clinic hours your family physician or the after hours number for the clinic or go to the Emergency Department.   BELOW ARE SYMPTOMS THAT SHOULD BE REPORTED IMMEDIATELY:  *FEVER GREATER THAN 100.5 F  *CHILLS WITH OR WITHOUT FEVER  NAUSEA AND VOMITING THAT IS NOT CONTROLLED WITH YOUR NAUSEA MEDICATION  *UNUSUAL SHORTNESS OF BREATH  *UNUSUAL BRUISING OR BLEEDING  TENDERNESS IN MOUTH AND THROAT WITH OR WITHOUT PRESENCE OF ULCERS  *URINARY PROBLEMS  *BOWEL PROBLEMS  UNUSUAL RASH Items with * indicate a potential emergency and should be followed up as soon as possible.   Please let the nurse know about any problems that you may have experienced. Feel free to call the clinic you have any questions or concerns. The clinic phone number is [phone].   I have been informed and understand all the instructions given to me. I know to contact the clinic, my physician, or go to the Emergency Department if any problems should occur. I do not have any questions at this time, but understand that I may call the clinic during office hours   should I have any questions or need assistance in obtaining follow up care.    __________________________________________  _____________  __________ Signature of Patient or Authorized Representative            Date                   Time    __________________________________________ Nurse's Signature    

## 2013-05-07 DIAGNOSIS — N400 Enlarged prostate without lower urinary tract symptoms: Secondary | ICD-10-CM | POA: Insufficient documentation

## 2013-05-07 DIAGNOSIS — J45909 Unspecified asthma, uncomplicated: Secondary | ICD-10-CM | POA: Insufficient documentation

## 2013-05-08 ENCOUNTER — Other Ambulatory Visit (HOSPITAL_BASED_OUTPATIENT_CLINIC_OR_DEPARTMENT_OTHER): Payer: BC Managed Care – PPO

## 2013-05-08 ENCOUNTER — Ambulatory Visit (HOSPITAL_BASED_OUTPATIENT_CLINIC_OR_DEPARTMENT_OTHER): Payer: BC Managed Care – PPO

## 2013-05-08 VITALS — BP 91/61 | HR 68 | Temp 97.8°F | Resp 20

## 2013-05-08 DIAGNOSIS — Z5112 Encounter for antineoplastic immunotherapy: Secondary | ICD-10-CM

## 2013-05-08 DIAGNOSIS — E8581 Light chain (AL) amyloidosis: Secondary | ICD-10-CM

## 2013-05-08 DIAGNOSIS — R809 Proteinuria, unspecified: Secondary | ICD-10-CM

## 2013-05-08 DIAGNOSIS — E8589 Other amyloidosis: Secondary | ICD-10-CM

## 2013-05-08 DIAGNOSIS — R803 Bence Jones proteinuria: Secondary | ICD-10-CM

## 2013-05-08 LAB — CBC WITH DIFFERENTIAL/PLATELET
BASO%: 0.3 % (ref 0.0–2.0)
Eosinophils Absolute: 0 10*3/uL (ref 0.0–0.5)
MCV: 93.3 fL (ref 79.3–98.0)
MONO#: 0.8 10*3/uL (ref 0.1–0.9)
MONO%: 13.6 % (ref 0.0–14.0)
NEUT#: 4.4 10*3/uL (ref 1.5–6.5)
RBC: 3.52 10*6/uL — ABNORMAL LOW (ref 4.20–5.82)
RDW: 19.5 % — ABNORMAL HIGH (ref 11.0–14.6)
WBC: 5.8 10*3/uL (ref 4.0–10.3)

## 2013-05-08 MED ORDER — ONDANSETRON HCL 8 MG PO TABS
8.0000 mg | ORAL_TABLET | Freq: Once | ORAL | Status: AC
  Administered 2013-05-08: 8 mg via ORAL

## 2013-05-08 MED ORDER — BORTEZOMIB CHEMO SQ INJECTION 3.5 MG (2.5MG/ML)
1.5000 mg/m2 | Freq: Once | INTRAMUSCULAR | Status: AC
  Administered 2013-05-08: 2.75 mg via SUBCUTANEOUS
  Filled 2013-05-08: qty 2.75

## 2013-05-08 NOTE — Patient Instructions (Addendum)
[facility] Discharge Instructions for Patients Receiving Chemotherapy  Today you received the following chemotherapy agents Velcade.  To help prevent nausea and vomiting after your treatment, we encourage you to take your nausea medication as prescribed.   If you develop nausea and vomiting that is not controlled by your nausea medication, call the clinic. If it is after clinic hours your family physician or the after hours number for the clinic or go to the Emergency Department.   BELOW ARE SYMPTOMS THAT SHOULD BE REPORTED IMMEDIATELY:  *FEVER GREATER THAN 100.5 F  *CHILLS WITH OR WITHOUT FEVER  NAUSEA AND VOMITING THAT IS NOT CONTROLLED WITH YOUR NAUSEA MEDICATION  *UNUSUAL SHORTNESS OF BREATH  *UNUSUAL BRUISING OR BLEEDING  TENDERNESS IN MOUTH AND THROAT WITH OR WITHOUT PRESENCE OF ULCERS  *URINARY PROBLEMS  *BOWEL PROBLEMS  UNUSUAL RASH Items with * indicate a potential emergency and should be followed up as soon as possible.  Feel free to call the clinic you have any questions or concerns. The clinic phone number is [phone].   I have been informed and understand all the instructions given to me. I know to contact the clinic, my physician, or go to the Emergency Department if any problems should occur. I do not have any questions at this time, but understand that I may call the clinic during office hours   should I have any questions or need assistance in obtaining follow up care.    __________________________________________  _____________  __________ Signature of Patient or Authorized Representative            Date                   Time    __________________________________________ Nurse's Signature    

## 2013-05-12 ENCOUNTER — Ambulatory Visit: Payer: 59 | Admitting: Cardiology

## 2013-05-15 ENCOUNTER — Ambulatory Visit: Payer: BC Managed Care – PPO | Admitting: Nutrition

## 2013-05-15 ENCOUNTER — Ambulatory Visit (HOSPITAL_BASED_OUTPATIENT_CLINIC_OR_DEPARTMENT_OTHER): Payer: BC Managed Care – PPO

## 2013-05-15 ENCOUNTER — Other Ambulatory Visit (HOSPITAL_BASED_OUTPATIENT_CLINIC_OR_DEPARTMENT_OTHER): Payer: BC Managed Care – PPO | Admitting: Lab

## 2013-05-15 VITALS — BP 100/64 | HR 107 | Temp 97.0°F

## 2013-05-15 DIAGNOSIS — E8581 Light chain (AL) amyloidosis: Secondary | ICD-10-CM

## 2013-05-15 DIAGNOSIS — R803 Bence Jones proteinuria: Secondary | ICD-10-CM

## 2013-05-15 DIAGNOSIS — E8589 Other amyloidosis: Secondary | ICD-10-CM

## 2013-05-15 DIAGNOSIS — Z5112 Encounter for antineoplastic immunotherapy: Secondary | ICD-10-CM

## 2013-05-15 LAB — CBC WITH DIFFERENTIAL/PLATELET
Basophils Absolute: 0 10*3/uL (ref 0.0–0.1)
Eosinophils Absolute: 0.1 10*3/uL (ref 0.0–0.5)
HCT: 30.7 % — ABNORMAL LOW (ref 38.4–49.9)
HGB: 11 g/dL — ABNORMAL LOW (ref 13.0–17.1)
MONO#: 0.8 10*3/uL (ref 0.1–0.9)
NEUT#: 3.3 10*3/uL (ref 1.5–6.5)
RDW: 19.9 % — ABNORMAL HIGH (ref 11.0–14.6)
lymph#: 0.5 10*3/uL — ABNORMAL LOW (ref 0.9–3.3)

## 2013-05-15 LAB — COMPREHENSIVE METABOLIC PANEL (CC13)
ALT: 24 U/L (ref 0–55)
AST: 27 U/L (ref 5–34)
Albumin: 2.3 g/dL — ABNORMAL LOW (ref 3.5–5.0)
Calcium: 8.3 mg/dL — ABNORMAL LOW (ref 8.4–10.4)
Chloride: 99 mEq/L (ref 98–107)
Creatinine: 0.9 mg/dL (ref 0.7–1.3)
Potassium: 3.6 mEq/L (ref 3.5–5.1)
Sodium: 139 mEq/L (ref 136–145)
Total Protein: 4.7 g/dL — ABNORMAL LOW (ref 6.4–8.3)

## 2013-05-15 MED ORDER — ONDANSETRON HCL 8 MG PO TABS
8.0000 mg | ORAL_TABLET | Freq: Once | ORAL | Status: AC
  Administered 2013-05-15: 8 mg via ORAL

## 2013-05-15 MED ORDER — BORTEZOMIB CHEMO SQ INJECTION 3.5 MG (2.5MG/ML)
1.5000 mg/m2 | Freq: Once | INTRAMUSCULAR | Status: AC
  Administered 2013-05-15: 2.75 mg via SUBCUTANEOUS
  Filled 2013-05-15: qty 2.75

## 2013-05-15 NOTE — Progress Notes (Signed)
I met with patient today in chemotherapy. Patient reports he is trying to eat however has had continued weight loss. Weight was documented as 157.9 pounds on May 9 down from 170.7 pounds April 4. Patient reports he continues to work. He eats better at his evening meal. He is trying to eat snacks in between. He denies other nutrition impact symptoms  Nutrition diagnosis: Unintended weight loss continues.  Intervention: I educated patient to increase oral intake, especially at his evening meal. We have reviewed ways to add calories throughout the day. I've again educated him on high-calorie snacks. I provided a list of foods that will add additional calories. Teach back method used.  Monitoring, evaluation, goals: Patient has been unable to increase oral intake to minimize weight loss.  Next visit: Friday, June 27, during chemotherapy.

## 2013-05-15 NOTE — Progress Notes (Signed)
Ok to treat per Dr. Sherrill. 

## 2013-05-15 NOTE — Patient Instructions (Addendum)
[facility] Discharge Instructions for Patients Receiving Chemotherapy  Today you received the following chemotherapy agents: Velcade.  To help prevent nausea and vomiting after your treatment, we encourage you to take your nausea medication as prescribed.   If you develop nausea and vomiting that is not controlled by your nausea medication, call the clinic.   BELOW ARE SYMPTOMS THAT SHOULD BE REPORTED IMMEDIATELY:  *FEVER GREATER THAN 100.5 F  *CHILLS WITH OR WITHOUT FEVER  NAUSEA AND VOMITING THAT IS NOT CONTROLLED WITH YOUR NAUSEA MEDICATION  *UNUSUAL SHORTNESS OF BREATH  *UNUSUAL BRUISING OR BLEEDING  TENDERNESS IN MOUTH AND THROAT WITH OR WITHOUT PRESENCE OF ULCERS  *URINARY PROBLEMS  *BOWEL PROBLEMS  UNUSUAL RASH Items with * indicate a potential emergency and should be followed up as soon as possible.  Feel free to call the clinic you have any questions or concerns. The clinic phone number is [phone].    

## 2013-05-17 ENCOUNTER — Other Ambulatory Visit (HOSPITAL_COMMUNITY): Payer: Self-pay | Admitting: Oncology

## 2013-05-17 DIAGNOSIS — R803 Bence Jones proteinuria: Secondary | ICD-10-CM

## 2013-05-17 DIAGNOSIS — E8581 Light chain (AL) amyloidosis: Secondary | ICD-10-CM

## 2013-05-17 DIAGNOSIS — R809 Proteinuria, unspecified: Secondary | ICD-10-CM

## 2013-05-18 ENCOUNTER — Telehealth: Payer: Self-pay | Admitting: *Deleted

## 2013-05-18 ENCOUNTER — Other Ambulatory Visit: Payer: Self-pay | Admitting: Oncology

## 2013-05-18 LAB — KAPPA/LAMBDA LIGHT CHAINS
Kappa:Lambda Ratio: 1.01 (ref 0.26–1.65)
Lambda Free Lght Chn: 0.78 mg/dL (ref 0.57–2.63)

## 2013-05-18 NOTE — Telephone Encounter (Signed)
Pt called & reports that he is going to be out of town 6/21-06/11/13 & is scheduled to see Lonna Cobb NP 05/22/13 with treatment but wants to know if he can move a treatment after that due to being in [place].  Note to Dr. Cyndie Chime.

## 2013-05-20 ENCOUNTER — Telehealth: Payer: Self-pay | Admitting: *Deleted

## 2013-05-20 ENCOUNTER — Encounter: Payer: Self-pay | Admitting: *Deleted

## 2013-05-20 NOTE — Telephone Encounter (Signed)
Message copied by Sabino Snipes on Wed May 20, 2013  2:12 PM ------      Message from: Levert Feinstein      Created: Sun May 17, 2013 11:07 AM       Cal patient - mildly dehydrated - stop lasix for now; we will repeat lab at time of visit this Fri (POF done) ------

## 2013-05-20 NOTE — Telephone Encounter (Signed)
Called pt to give message from Dr. Cyndie Chime regarding stopping lasix & pt reports that he is not on lasix & Dr Allena Katz stopped & started torsemide 40mg  bid x 5 days on 05/08/13 & then 20mg  bid.  This message was given to Dr Cyndie Chime & he will discuss with nephrologist. Dr. Cyndie Chime discussed labs with Dr Caryn Section & returned call to pt & informed that elevation in BUN may be from the steroids & therefore no changes at this time & Lonna Cobb NP will discuss 05/22/13.

## 2013-05-22 ENCOUNTER — Ambulatory Visit (HOSPITAL_BASED_OUTPATIENT_CLINIC_OR_DEPARTMENT_OTHER): Payer: BC Managed Care – PPO | Admitting: Nurse Practitioner

## 2013-05-22 ENCOUNTER — Other Ambulatory Visit (HOSPITAL_BASED_OUTPATIENT_CLINIC_OR_DEPARTMENT_OTHER): Payer: BC Managed Care – PPO

## 2013-05-22 ENCOUNTER — Telehealth: Payer: Self-pay | Admitting: *Deleted

## 2013-05-22 ENCOUNTER — Telehealth: Payer: Self-pay | Admitting: Oncology

## 2013-05-22 ENCOUNTER — Ambulatory Visit (HOSPITAL_BASED_OUTPATIENT_CLINIC_OR_DEPARTMENT_OTHER): Payer: BC Managed Care – PPO

## 2013-05-22 VITALS — BP 102/54 | HR 100 | Temp 97.6°F | Resp 17 | Ht 65.0 in | Wt 140.6 lb

## 2013-05-22 DIAGNOSIS — Z5112 Encounter for antineoplastic immunotherapy: Secondary | ICD-10-CM

## 2013-05-22 DIAGNOSIS — R803 Bence Jones proteinuria: Secondary | ICD-10-CM

## 2013-05-22 DIAGNOSIS — E8589 Other amyloidosis: Secondary | ICD-10-CM

## 2013-05-22 DIAGNOSIS — R809 Proteinuria, unspecified: Secondary | ICD-10-CM

## 2013-05-22 DIAGNOSIS — I10 Essential (primary) hypertension: Secondary | ICD-10-CM

## 2013-05-22 DIAGNOSIS — R630 Anorexia: Secondary | ICD-10-CM

## 2013-05-22 DIAGNOSIS — E8581 Light chain (AL) amyloidosis: Secondary | ICD-10-CM

## 2013-05-22 LAB — CBC WITH DIFFERENTIAL/PLATELET
Basophils Absolute: 0 10*3/uL (ref 0.0–0.1)
EOS%: 1.5 % (ref 0.0–7.0)
HCT: 30.7 % — ABNORMAL LOW (ref 38.4–49.9)
HGB: 11 g/dL — ABNORMAL LOW (ref 13.0–17.1)
MONO%: 15.8 % — ABNORMAL HIGH (ref 0.0–14.0)
NEUT#: 3.7 10*3/uL (ref 1.5–6.5)
Platelets: 96 10*3/uL — ABNORMAL LOW (ref 140–400)
RBC: 3.22 10*6/uL — ABNORMAL LOW (ref 4.20–5.82)
RDW: 20.6 % — ABNORMAL HIGH (ref 11.0–14.6)
WBC: 5.3 10*3/uL (ref 4.0–10.3)

## 2013-05-22 LAB — BASIC METABOLIC PANEL (CC13)
BUN: 53.4 mg/dL — ABNORMAL HIGH (ref 7.0–26.0)
Potassium: 3.8 mEq/L (ref 3.5–5.1)
Sodium: 141 mEq/L (ref 136–145)

## 2013-05-22 MED ORDER — ONDANSETRON HCL 8 MG PO TABS
8.0000 mg | ORAL_TABLET | Freq: Once | ORAL | Status: AC
  Administered 2013-05-22: 8 mg via ORAL

## 2013-05-22 MED ORDER — BORTEZOMIB CHEMO SQ INJECTION 3.5 MG (2.5MG/ML)
1.5000 mg/m2 | Freq: Once | INTRAMUSCULAR | Status: AC
  Administered 2013-05-22: 2.75 mg via SUBCUTANEOUS
  Filled 2013-05-22: qty 2.75

## 2013-05-22 NOTE — Progress Notes (Signed)
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 He was started on Cytoxan 300 mg per meter squared weekly, dexamethasone 40 mg weekly and subcutaneous Velcade 1.5 mg per meter squared weekly beginning 02/20/2013.   Most recent serum light chain analysis on 05/15/2013 showed stable to further improved lambda free light chains at 0.78 as compared to 0.87 on 04/10/2013, 8.56 on 03/06/2013 and 17.4 on 02/13/2013.  He is seen today for scheduled followup. He denies nausea/vomiting. No mouth sores. He has occasional loose stools. He tends to note the loose stools after taking a stool softener. He has mild numbness/tingling in the feet which predated the start of treatment. No numbness or tingling in the hands. Leg swelling is better. Appetite is poor. He estimates drinking 4-5 nutritional supplements per day.   Objective: Blood pressure 102/54, pulse 100, temperature 97.6 F (36.4 C), temperature source Oral, resp. rate 17, height 5\' 5"  (1.651 m), weight 140 lb 9.6 oz (63.776 kg), SpO2 97.00%.  Oropharynx is without thrush or ulceration. Mucous membranes appear moist. Lungs are clear. Regular cardiac rhythm. Abdomen is soft and nontender. No organomegaly. 2+ pitting edema at the lower legs bilaterally. Faint pink discoloration of the pretibial regions. Motor strength 5 over 5. Knee DTRs 2+, symmetric. Vibratory sense mildly decreased over the fingertips per tuning fork exam.  Lab Results: Lab Results  Component Value Date   WBC 5.3 05/22/2013   HGB 11.0* 05/22/2013   HCT 30.7* 05/22/2013   MCV 95.3 05/22/2013   PLT 96* 05/22/2013    Chemistry:    Chemistry      Component Value Date/Time   NA 141 05/22/2013 0922   NA 135 02/20/2013 1157   K 3.8 05/22/2013 0922   K 3.9 02/20/2013 1157   CL 100 05/22/2013 0922   CL 103 02/20/2013 1157   CO2 33* 05/22/2013 0922   CO2 27 02/20/2013 1157   BUN 53.4* 05/22/2013 0922   BUN  30* 02/20/2013 1157   CREATININE 0.9 05/22/2013 0922   CREATININE 1.1 02/20/2013 1157   CREATININE 0.93 12/15/2012 0832      Component Value Date/Time   CALCIUM 8.7 05/22/2013 0922   CALCIUM 8.2* 02/20/2013 1157   ALKPHOS 58 05/15/2013 0832   ALKPHOS 80 01/21/2013 0932   AST 27 05/15/2013 0832   AST 22 01/21/2013 0932   ALT 24 05/15/2013 0832   ALT 18 01/21/2013 0932   BILITOT 0.64 05/15/2013 0832   BILITOT 0.8 01/21/2013 0932       Studies/Results: No results found.  Medications: I have reviewed the patient's current medications.  Assessment/Plan:  1. 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 He has been evaluated by Dr.Gasparetto at [facility]. 2. Essential hypertension. 3. Anorexia/weight loss. Part of the weight loss is due to improvement in leg edema. He is being followed by the [facility] dietitian. We will continue to follow his weight closely.  Disposition-plan to continue Velcade/Cytoxan/dexamethasone with some changes to the upcoming schedule due to a planned vacation. He will return for a followup visit with Dr. Cyndie Chime on 06/29/2013. He will contact the office in the interim with any problems.  Lonna Cobb ANP/GNP-BC   CC Dr. Cassell Clement, Dr. Terrial Rhodes and Dr. Garth Bigness.

## 2013-05-22 NOTE — Patient Instructions (Addendum)
[facility] Discharge Instructions for Patients Receiving Chemotherapy  Today you received the following chemotherapy agents: Velcade.  To help prevent nausea and vomiting after your treatment, we encourage you to take your nausea medication as prescribed.   If you develop nausea and vomiting that is not controlled by your nausea medication, call the clinic.   BELOW ARE SYMPTOMS THAT SHOULD BE REPORTED IMMEDIATELY:  *FEVER GREATER THAN 100.5 F  *CHILLS WITH OR WITHOUT FEVER  NAUSEA AND VOMITING THAT IS NOT CONTROLLED WITH YOUR NAUSEA MEDICATION  *UNUSUAL SHORTNESS OF BREATH  *UNUSUAL BRUISING OR BLEEDING  TENDERNESS IN MOUTH AND THROAT WITH OR WITHOUT PRESENCE OF ULCERS  *URINARY PROBLEMS  *BOWEL PROBLEMS  UNUSUAL RASH Items with * indicate a potential emergency and should be followed up as soon as possible.  Feel free to call the clinic you have any questions or concerns. The clinic phone number is [phone].    

## 2013-05-22 NOTE — Telephone Encounter (Signed)
Per staff message and POF I have scheduled appts.  JMW  

## 2013-05-28 ENCOUNTER — Other Ambulatory Visit: Payer: Self-pay | Admitting: Physician Assistant

## 2013-05-28 ENCOUNTER — Other Ambulatory Visit (HOSPITAL_BASED_OUTPATIENT_CLINIC_OR_DEPARTMENT_OTHER): Payer: BC Managed Care – PPO | Admitting: Lab

## 2013-05-28 ENCOUNTER — Ambulatory Visit (HOSPITAL_BASED_OUTPATIENT_CLINIC_OR_DEPARTMENT_OTHER): Payer: BC Managed Care – PPO

## 2013-05-28 DIAGNOSIS — I959 Hypotension, unspecified: Secondary | ICD-10-CM

## 2013-05-28 DIAGNOSIS — E8581 Light chain (AL) amyloidosis: Secondary | ICD-10-CM

## 2013-05-28 DIAGNOSIS — E8589 Other amyloidosis: Secondary | ICD-10-CM

## 2013-05-28 LAB — CBC WITH DIFFERENTIAL/PLATELET
BASO%: 0.6 % (ref 0.0–2.0)
LYMPH%: 8.9 % — ABNORMAL LOW (ref 14.0–49.0)
MCHC: 35.7 g/dL (ref 32.0–36.0)
MONO#: 0.7 10*3/uL (ref 0.1–0.9)
MONO%: 14.1 % — ABNORMAL HIGH (ref 0.0–14.0)
Platelets: 75 10*3/uL — ABNORMAL LOW (ref 140–400)
RBC: 2.97 10*6/uL — ABNORMAL LOW (ref 4.20–5.82)
WBC: 5.3 10*3/uL (ref 4.0–10.3)

## 2013-05-28 MED ORDER — SODIUM CHLORIDE 0.9 % IV SOLN
1000.0000 mL | Freq: Once | INTRAVENOUS | Status: AC
  Administered 2013-05-28: 1000 mL via INTRAVENOUS

## 2013-05-28 NOTE — Progress Notes (Signed)
1440 Went to lobby to inform patient no treatment today d/t low platelets, pt stood up and became lightheaded, had to sit down. V/S obtained, B/P low, reported to Dr Truett Perna. Patient reports the lightheadedness is not new, been going on for 2-3 months and Dr Cyndie Chime and his cardiologist aware of this. Provided pt with drink before discharging.  1529 Orthostatic B/P sitting @ 86/52, standing 67/37  Per PA, Tiana Loft, patient to receive IV fluids.  1755 Patient orthostatic VS after 1 liter of IV fluids. B/P sitting  92/57 HR @ 83, standing 80/44 HR @ 85. Patient states not feeling dizzy or lightheaded. States will f/u with Dr Patsy Lager nurse in morning re next Velcade injection and will f/u with Dr. Allena Katz regarding diuretic and his B/P. Patient discharge with spouse AVS provided.

## 2013-05-28 NOTE — Patient Instructions (Signed)
Dehydration, Adult Dehydration is when you lose more fluids from the body than you take in. Vital organs like the kidneys, brain, and heart cannot function without a proper amount of fluids and salt. Any loss of fluids from the body can cause dehydration.  CAUSES   Vomiting.  Diarrhea.  Excessive sweating.  Excessive urine output.  Fever. SYMPTOMS  Mild dehydration  Thirst.  Dry lips.  Slightly dry mouth. Moderate dehydration  Very dry mouth.  Sunken eyes.  Skin does not bounce back quickly when lightly pinched and released.  Dark urine and decreased urine production.  Decreased tear production.  Headache. Severe dehydration  Very dry mouth.  Extreme thirst.  Rapid, weak pulse (more than 100 beats per minute at rest).  Cold hands and feet.  Not able to sweat in spite of heat and temperature.  Rapid breathing.  Blue lips.  Confusion and lethargy.  Difficulty being awakened.  Minimal urine production.  No tears. DIAGNOSIS  Your caregiver will diagnose dehydration based on your symptoms and your exam. Blood and urine tests will help confirm the diagnosis. The diagnostic evaluation should also identify the cause of dehydration. TREATMENT  Treatment of mild or moderate dehydration can often be done at home by increasing the amount of fluids that you drink. It is best to drink small amounts of fluid more often. Drinking too much at one time can make vomiting worse. Refer to the home care instructions below. Severe dehydration needs to be treated at the hospital where you will probably be given intravenous (IV) fluids that contain water and electrolytes. HOME CARE INSTRUCTIONS   Ask your caregiver about specific rehydration instructions.  Drink enough fluids to keep your urine clear or pale yellow.  Drink small amounts frequently if you have nausea and vomiting.  Eat as you normally do.  Avoid:  Foods or drinks high in sugar.  Carbonated  drinks.  Juice.  Extremely hot or cold fluids.  Drinks with caffeine.  Fatty, greasy foods.  Alcohol.  Tobacco.  Overeating.  Gelatin desserts.  Wash your hands well to avoid spreading bacteria and viruses.  Only take over-the-counter or prescription medicines for pain, discomfort, or fever as directed by your caregiver.  Ask your caregiver if you should continue all prescribed and over-the-counter medicines.  Keep all follow-up appointments with your caregiver. SEEK MEDICAL CARE IF:  You have abdominal pain and it increases or stays in one area (localizes).  You have a rash, stiff neck, or severe headache.  You are irritable, sleepy, or difficult to awaken.  You are weak, dizzy, or extremely thirsty. SEEK IMMEDIATE MEDICAL CARE IF:   You are unable to keep fluids down or you get worse despite treatment.  You have frequent episodes of vomiting or diarrhea.  You have blood or green matter (bile) in your vomit.  You have blood in your stool or your stool looks black and tarry.  You have not urinated in 6 to 8 hours, or you have only urinated a small amount of very dark urine.  You have a fever.  You faint. MAKE SURE YOU:   Understand these instructions.  Will watch your condition.  Will get help right away if you are not doing well or get worse. Document Released: 11/26/2005 Document Revised: 02/18/2012 Document Reviewed: 07/16/2011 Exit[employer] Patient Information 2014 [employer].  

## 2013-05-28 NOTE — Progress Notes (Signed)
Per Dr. Truett Perna, hold today's treatment due to platelets being 75. Hale Drone, RN, to go and talk with patient.

## 2013-05-29 ENCOUNTER — Other Ambulatory Visit: Payer: BC Managed Care – PPO | Admitting: Lab

## 2013-05-29 ENCOUNTER — Other Ambulatory Visit (HOSPITAL_COMMUNITY): Payer: Self-pay | Admitting: Physician Assistant

## 2013-05-29 ENCOUNTER — Encounter: Payer: Self-pay | Admitting: Physician Assistant

## 2013-05-29 ENCOUNTER — Ambulatory Visit: Payer: BC Managed Care – PPO

## 2013-05-29 NOTE — Progress Notes (Signed)
Late entry: Called to the infusion room on 05/28/2013 to evaluate patient complaining of dizziness. Patient was found to be hypotensive with blood pressure sitting is 86/52 standing 67/37. Chest is clear to auscultation cardiovascular exam revealed a regular in rhythm with a normal S1 and S2, there is 2+ pitting edema bilateral lower extremities. 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 Patient was reviewed with Dr. Truett Perna. Patient was given 1 L of normal saline with improvement in his sitting blood pressure to 92/57. Patient also subjectively felt significantly better. Patient was advised to hold his Demadex and notify the providers at [facility] in the morning of his hypotension.  Laural Benes, Debbora Ang E, PA-C

## 2013-06-05 ENCOUNTER — Other Ambulatory Visit: Payer: BC Managed Care – PPO | Admitting: Lab

## 2013-06-05 ENCOUNTER — Ambulatory Visit: Payer: BC Managed Care – PPO

## 2013-06-05 ENCOUNTER — Encounter: Payer: BC Managed Care – PPO | Admitting: Nutrition

## 2013-06-15 ENCOUNTER — Ambulatory Visit: Payer: BC Managed Care – PPO | Admitting: Nutrition

## 2013-06-15 ENCOUNTER — Other Ambulatory Visit: Payer: Self-pay | Admitting: Oncology

## 2013-06-15 ENCOUNTER — Other Ambulatory Visit (HOSPITAL_BASED_OUTPATIENT_CLINIC_OR_DEPARTMENT_OTHER): Payer: BC Managed Care – PPO | Admitting: Lab

## 2013-06-15 ENCOUNTER — Encounter: Payer: Self-pay | Admitting: Oncology

## 2013-06-15 ENCOUNTER — Ambulatory Visit: Payer: BC Managed Care – PPO

## 2013-06-15 DIAGNOSIS — E8581 Light chain (AL) amyloidosis: Secondary | ICD-10-CM

## 2013-06-15 DIAGNOSIS — E8589 Other amyloidosis: Secondary | ICD-10-CM

## 2013-06-15 LAB — CBC WITH DIFFERENTIAL/PLATELET
BASO%: 0.9 % (ref 0.0–2.0)
MCHC: 35.7 g/dL (ref 32.0–36.0)
MONO#: 0.1 10*3/uL (ref 0.1–0.9)
RBC: 2.81 10*6/uL — ABNORMAL LOW (ref 4.20–5.82)
RDW: 18.5 % — ABNORMAL HIGH (ref 11.0–14.6)
WBC: 1.6 10*3/uL — ABNORMAL LOW (ref 4.0–10.3)
lymph#: 0.3 10*3/uL — ABNORMAL LOW (ref 0.9–3.3)

## 2013-06-15 NOTE — Progress Notes (Signed)
Nutrition Note  Assessment: I met with pt today in chemotherapy. Pt reports that he just got back from a cruise to [place] and that he gained about 5-6 lbs. He weighed himself at home today and was 144 lbs. He reports an increase in appetite and that he has been drinking at least 3 Boost shakes per day. He says he has been drinking plenty of fluids. He has been experiencing a change in taste when he eats meats, especially chicken and beef. He says that he does ok with fish and eggs.  Nutrition Diagnosis: Unintended weight loss, improving.  Intervention: Pt was educated to continue Boost 3 or more times per day. He was also educated using teach-back method and given handouts on different foods to try instead of chicken and beef. He was also re-educated on ways to increase caloric intake.   Monitoring: Weight, po intake, labs  Goals: To continue to increase or maintain weight.  To consume adequate amounts of calories, protein, and fluids.  Ebbie Latus RD, LDN

## 2013-06-15 NOTE — Patient Instructions (Signed)
No Velcade today. Will resume on 7/14 per Dr Cyndie Chime.  Take 7 Cytoxan pills on 06/22/13.   Call this office for any questions or concerns. [phone]

## 2013-06-15 NOTE — Progress Notes (Signed)
Per Dr Cyndie Chime; patient not to be treated today and will return on 7/14 for Velcade. Patient instructed per Dr Cyndie Chime to reduce dose of oral Cytoxan on 7/14 to 7 pills. Patient verbalized understanding and instructed to call with any questions or concerns.

## 2013-06-15 NOTE — Progress Notes (Signed)
Due to persistent low counts I am making a dose adjustment. 25% reduction in Cytoxan from 300 mg per meter squared to 225 mg for meter squared decreased from total of 500 mg to 350 mg equals 7x50 mg tablets weekly. Decreased Velcade from 1.5 mg per meter squared to 1.3 mg per meter squared Hold treatment today July 7 and resume on July 14. Note: Patient took 500 mg of Cytoxan today before he came to the office. I will hold Velcade dose today.

## 2013-06-22 ENCOUNTER — Telehealth: Payer: Self-pay

## 2013-06-22 ENCOUNTER — Other Ambulatory Visit (HOSPITAL_BASED_OUTPATIENT_CLINIC_OR_DEPARTMENT_OTHER): Payer: BC Managed Care – PPO | Admitting: Lab

## 2013-06-22 ENCOUNTER — Ambulatory Visit (HOSPITAL_BASED_OUTPATIENT_CLINIC_OR_DEPARTMENT_OTHER): Payer: BC Managed Care – PPO

## 2013-06-22 DIAGNOSIS — Z5112 Encounter for antineoplastic immunotherapy: Secondary | ICD-10-CM

## 2013-06-22 DIAGNOSIS — E8589 Other amyloidosis: Secondary | ICD-10-CM

## 2013-06-22 DIAGNOSIS — E8581 Light chain (AL) amyloidosis: Secondary | ICD-10-CM

## 2013-06-22 DIAGNOSIS — R803 Bence Jones proteinuria: Secondary | ICD-10-CM

## 2013-06-22 LAB — CBC WITH DIFFERENTIAL/PLATELET
BASO%: 0.2 % (ref 0.0–2.0)
Basophils Absolute: 0 10*3/uL (ref 0.0–0.1)
HCT: 29.1 % — ABNORMAL LOW (ref 38.4–49.9)
HGB: 10.2 g/dL — ABNORMAL LOW (ref 13.0–17.1)
MONO#: 0.6 10*3/uL (ref 0.1–0.9)
NEUT%: 88 % — ABNORMAL HIGH (ref 39.0–75.0)
WBC: 7.5 10*3/uL (ref 4.0–10.3)
lymph#: 0.3 10*3/uL — ABNORMAL LOW (ref 0.9–3.3)

## 2013-06-22 LAB — COMPREHENSIVE METABOLIC PANEL (CC13)
ALT: 19 U/L (ref 0–55)
AST: 22 U/L (ref 5–34)
CO2: 24 mEq/L (ref 22–29)
Chloride: 102 mEq/L (ref 98–109)
Sodium: 138 mEq/L (ref 136–145)
Total Bilirubin: 0.31 mg/dL (ref 0.20–1.20)
Total Protein: 4.8 g/dL — ABNORMAL LOW (ref 6.4–8.3)

## 2013-06-22 MED ORDER — BORTEZOMIB CHEMO SQ INJECTION 3.5 MG (2.5MG/ML)
1.3000 mg/m2 | Freq: Once | INTRAMUSCULAR | Status: AC
  Administered 2013-06-22: 2.5 mg via SUBCUTANEOUS
  Filled 2013-06-22: qty 2.5

## 2013-06-22 MED ORDER — ONDANSETRON HCL 8 MG PO TABS
8.0000 mg | ORAL_TABLET | Freq: Once | ORAL | Status: AC
  Administered 2013-06-22: 8 mg via ORAL

## 2013-06-22 NOTE — Patient Instructions (Addendum)
[facility] Discharge Inst[facility] for Patients Receiving Chemotherapy  Today you received the following chemotherapy agents: Velcade. To help prevent nausea and vomiting after your treatment, we encourage you to take your nausea medication.  If you develop nausea and vomiting that is not controlled by your nausea medication, call the clinic.   BELOW ARE SYMPTOMS THAT SHOULD BE REPORTED IMMEDIATELY:  *FEVER GREATER THAN 100.5 F  *CHILLS WITH OR WITHOUT FEVER  NAUSEA AND VOMITING THAT IS NOT CONTROLLED WITH YOUR NAUSEA MEDICATION  *UNUSUAL SHORTNESS OF BREATH  *UNUSUAL BRUISING OR BLEEDING  TENDERNESS IN MOUTH AND THROAT WITH OR WITHOUT PRESENCE OF ULCERS  *URINARY PROBLEMS  *BOWEL PROBLEMS  UNUSUAL RASH Items with * indicate a potential emergency and should be followed up as soon as possible.  Feel free to call the clinic you have any questions or concerns. The clinic phone number is [phone].    

## 2013-06-23 LAB — KAPPA/LAMBDA LIGHT CHAINS
Kappa:Lambda Ratio: 1.38 (ref 0.26–1.65)
Lambda Free Lght Chn: 0.66 mg/dL (ref 0.57–2.63)

## 2013-06-28 ENCOUNTER — Other Ambulatory Visit: Payer: Self-pay | Admitting: Oncology

## 2013-06-29 ENCOUNTER — Ambulatory Visit (HOSPITAL_BASED_OUTPATIENT_CLINIC_OR_DEPARTMENT_OTHER): Payer: BC Managed Care – PPO | Admitting: Oncology

## 2013-06-29 ENCOUNTER — Other Ambulatory Visit (HOSPITAL_BASED_OUTPATIENT_CLINIC_OR_DEPARTMENT_OTHER): Payer: BC Managed Care – PPO | Admitting: Lab

## 2013-06-29 ENCOUNTER — Ambulatory Visit: Payer: BC Managed Care – PPO

## 2013-06-29 ENCOUNTER — Telehealth: Payer: Self-pay | Admitting: Oncology

## 2013-06-29 VITALS — BP 109/62 | HR 105 | Temp 97.9°F | Resp 18 | Ht 65.0 in | Wt 142.3 lb

## 2013-06-29 DIAGNOSIS — E8589 Other amyloidosis: Secondary | ICD-10-CM

## 2013-06-29 DIAGNOSIS — I5032 Chronic diastolic (congestive) heart failure: Secondary | ICD-10-CM

## 2013-06-29 DIAGNOSIS — R634 Abnormal weight loss: Secondary | ICD-10-CM | POA: Insufficient documentation

## 2013-06-29 DIAGNOSIS — E8581 Light chain (AL) amyloidosis: Secondary | ICD-10-CM

## 2013-06-29 LAB — CBC WITH DIFFERENTIAL/PLATELET
Eosinophils Absolute: 0 10*3/uL (ref 0.0–0.5)
HGB: 10.3 g/dL — ABNORMAL LOW (ref 13.0–17.1)
LYMPH%: 4.9 % — ABNORMAL LOW (ref 14.0–49.0)
MONO#: 1.1 10*3/uL — ABNORMAL HIGH (ref 0.1–0.9)
NEUT#: 6.7 10*3/uL — ABNORMAL HIGH (ref 1.5–6.5)
Platelets: 189 10*3/uL (ref 140–400)
RBC: 2.81 10*6/uL — ABNORMAL LOW (ref 4.20–5.82)
RDW: 16.2 % — ABNORMAL HIGH (ref 11.0–14.6)
WBC: 8.2 10*3/uL (ref 4.0–10.3)

## 2013-06-29 NOTE — Progress Notes (Signed)
Hematology and Oncology Follow Up Visit  Eugene Martinez [id number] 1946-06-26 67 y.o. 06/29/2013 8:26 PM   Principle Diagnosis: Encounter Diagnoses  Name Primary?  . Chronic diastolic heart failure   . Weight loss, non-intentional   . AL amyloidosis Yes  . Weight loss, unintentional      Interim History:   Followup visit for this pleasant 67 year old man with primary AL. amyloidosis. He has been receiving induction chemotherapy with a combination of oral Cytoxan, weekly subcutaneous Velcade, and weekly oral dexamethasone. He has been on treatment for 15 weeks. He had a single,delay in treatment when his white count fell to 1600 with neutrophils 1100 and platelets 75,000 recently in late June(platelets) and early July(WBC). Around the same time, he developed increasing peripheral edema and orthostatic dizziness. He had a disproportionate rise in his BUN  compared with his creatinine. I initially thought this was the catabolic effect of steroids however it appears that he did get dehydrated. His diuretic dose was adjusted after consultation with his cardiologist and nephrologist. He received IV fluids in our office on one occasion. When I saw him today it was obvious that he had lost a significant amount of weight in fact he is down 29 pounds from weight of 171 pounds on April 4 to 142 pounds today.  Medications: reviewed  Allergies:  Allergies  Allergen Reactions  . Other Other (See Comments)    Seasonal allergies/grass/trees/dust causes watery eyes, stuffiness, sometimes swelling.    Review of Systems: Constitutional:   See above Respiratory: No dyspnea at rest. No PND. No orthopnea Cardiovascular: No ischemic type chest pain or palpitations  Gastrointestinal: No change in bowel habit Genito-Urinary: No urinary tract symptoms Musculoskeletal: No muscle, bone, or joint pain Neurologic: No headache or change in vision, no paresthesias Skin: No rash or ecchymosis Remaining ROS  negative.  Physical Exam: Blood pressure 109/62, pulse 105, temperature 97.9 F (36.6 C), temperature source Oral, resp. rate 18, height 5\' 5"  (1.651 m), weight 142 lb 4.8 oz (64.547 kg). Wt Readings from Last 3 Encounters:  06/29/13 142 lb 4.8 oz (64.547 kg)  05/22/13 140 lb 9.6 oz (63.776 kg)  04/17/13 157 lb 14.4 oz (71.623 kg)     General appearance: He now appears cachectic. HENNT: Prominent periorbital ecchymoses affecting bilateral eyelids. Pharynx no erythema, exudate, or ulcers Lymph nodes: No lymphadenopathy Breasts: Lungs: Clear to auscultation resonant to percussion Heart: Regular rhythm no murmur Abdomen: Soft, nontender, no mass, no organomegaly Extremities: 2+ ankle edema Musculoskeletal: No joint deformities GU: Vascular: Neurologic: Mental status intact, PERRLA, motor strength 5 over 5, reflexes 1+ symmetric Skin: No rash. Periorbital ecchymoses as noted above.  Lab Results: Lab Results  Component Value Date   WBC 8.2 06/29/2013   HGB 10.3* 06/29/2013   HCT 29.0* 06/29/2013   MCV 103.1* 06/29/2013   PLT 189 06/29/2013     Chemistry      Component Value Date/Time   NA 138 06/22/2013 1339   NA 135 02/20/2013 1157   K 4.2 06/22/2013 1339   K 3.9 02/20/2013 1157   CL 100 05/22/2013 0922   CL 103 02/20/2013 1157   CO2 24 06/22/2013 1339   CO2 27 02/20/2013 1157   BUN 36.5* 06/22/2013 1339   BUN 30* 02/20/2013 1157   CREATININE 0.9 06/22/2013 1339   CREATININE 1.1 02/20/2013 1157   CREATININE 0.93 12/15/2012 0832      Component Value Date/Time   CALCIUM 8.4 06/22/2013 1339   CALCIUM 8.2* 02/20/2013 1157  ALKPHOS 69 06/22/2013 1339   ALKPHOS 80 01/21/2013 0932   AST 22 06/22/2013 1339   AST 22 01/21/2013 0932   ALT 19 06/22/2013 1339   ALT 18 01/21/2013 0932   BILITOT 0.31 06/22/2013 1339   BILITOT 0.8 01/21/2013 0932     Lambda free light chains 0.66 with free light chain kappa/lambda ratio 1.38 on 06/22/2013 with pretreatment lambda free light chains 17.4 mg percent,  ratio 0.07 on 02/13/2013.  Impression: #1. Amyloidosis with lambda light chain restriction He has achieved a nice response with normalization of his light chains. I wanted to repeat a 24 urine but he is going to have this done at [facility] and they prefer to do it at their own institution. He has a followup visit there next week.  Despite responding to treatment, he has had a dramatic decline in his weight that I must attribute directly to the chemotherapy treatments. I'm going to stop all of his treatments at this time except for weekly steroids. He is working with nutritional supplements to boost his weight. I do not think he is physically strong enough to withstand a high-dose chemotherapy program at this time. He is due to be reevaluated at [facility] next week. I will discuss management with Dr. Barbaraann Boys. I feel he might best be served by just going on a Revlimid, dexamethasone, maintenance at this time until he builds up more strength.  #2. Nephrotic syndrome secondary to #1.  #3. 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 Preserved ejection fraction but evidence for some diastolic dysfunction. Significant elevation of pro BNP 5284 units at diagnosis in November 2013. Normal troponins.   CC:.    Levert Feinstein, MD 7/21/20148:26 PM

## 2013-06-29 NOTE — Telephone Encounter (Signed)
gv and printed appt sched and avs  °

## 2013-07-07 ENCOUNTER — Telehealth: Payer: Self-pay | Admitting: Oncology

## 2013-07-07 NOTE — Telephone Encounter (Signed)
RECORDS FAXED TO [facility] @[phone].

## 2013-07-15 ENCOUNTER — Other Ambulatory Visit: Payer: Self-pay

## 2013-07-17 ENCOUNTER — Ambulatory Visit (HOSPITAL_BASED_OUTPATIENT_CLINIC_OR_DEPARTMENT_OTHER): Payer: BC Managed Care – PPO | Admitting: Oncology

## 2013-07-17 ENCOUNTER — Other Ambulatory Visit (HOSPITAL_BASED_OUTPATIENT_CLINIC_OR_DEPARTMENT_OTHER): Payer: BC Managed Care – PPO | Admitting: Lab

## 2013-07-17 VITALS — BP 113/60 | HR 100 | Temp 97.4°F | Resp 18 | Ht 65.0 in | Wt 151.4 lb

## 2013-07-17 DIAGNOSIS — I452 Bifascicular block: Secondary | ICD-10-CM

## 2013-07-17 DIAGNOSIS — E8581 Light chain (AL) amyloidosis: Secondary | ICD-10-CM

## 2013-07-17 DIAGNOSIS — R809 Proteinuria, unspecified: Secondary | ICD-10-CM

## 2013-07-17 DIAGNOSIS — E8589 Other amyloidosis: Secondary | ICD-10-CM

## 2013-07-17 DIAGNOSIS — R634 Abnormal weight loss: Secondary | ICD-10-CM

## 2013-07-17 DIAGNOSIS — I5032 Chronic diastolic (congestive) heart failure: Secondary | ICD-10-CM

## 2013-07-17 LAB — COMPREHENSIVE METABOLIC PANEL (CC13)
CO2: 25 mEq/L (ref 22–29)
Creatinine: 0.7 mg/dL (ref 0.7–1.3)
Glucose: 94 mg/dl (ref 70–140)
Total Bilirubin: 0.25 mg/dL (ref 0.20–1.20)

## 2013-07-17 LAB — CBC WITH DIFFERENTIAL/PLATELET
Eosinophils Absolute: 0.1 10*3/uL (ref 0.0–0.5)
HCT: 30.1 % — ABNORMAL LOW (ref 38.4–49.9)
LYMPH%: 20.4 % (ref 14.0–49.0)
MONO#: 0.6 10*3/uL (ref 0.1–0.9)
NEUT#: 2.5 10*3/uL (ref 1.5–6.5)
NEUT%: 62.2 % (ref 39.0–75.0)
Platelets: 116 10*3/uL — ABNORMAL LOW (ref 140–400)
WBC: 4 10*3/uL (ref 4.0–10.3)

## 2013-07-17 NOTE — Progress Notes (Signed)
Hematology and Oncology Follow Up Visit  Eugene Martinez [id number] October 04, 1946 67 y.o. 07/17/2013 5:26 PM   Principle Diagnosis: Encounter Diagnoses  Name Primary?  . AL amyloidosis Yes  . Weight loss, unintentional   . Chronic diastolic heart failure   . Proteinuria   . RBBB (right bundle branch block with left anterior fascicular block)      Interim History:   Followup visit for this 67 year old man diagnosed with primary AL amyloidosis in December 2013 when he presented with a near syncopal episode and was found to have an abnormal echocardiogram pattern suggestive of amyloidosis and nephrotic range proteinuria which was nonselective but contained monoclonal lambda light chains. He had a significant elevation of pro BNP but normal troponin levels. Normal estimated ejection fraction both on echocardiogram and cardiac MRI. He was started on a chemotherapy program with oral Cytoxan, subcutaneous Velcade, and oral dexamethasone. He was doing very well on the program until very recently when he got dehydrated, had rapid weight and strength loss. Treatment was interrupted with the most recent dose of Velcade given on July 14. He has had slow but steady improvement in his performance status and weight since that time. His diuretics were tapered and then discontinued. He is now using diuretics on a when necessary basis only.  He had a followup visit at the [facility] last week. Serum free light chains have normalized and on July 31,  free lambda 0.66 mg percent, kappa/lambda ratio now normal at 0.67. These results are comparable to those obtained in our office on July 14 (lambda free light chains 0.66, ratio 1.38). A  24-hour urine continues to show a significant nonselective proteinuria 4.2 g but IFE negative for free lambda light chains. Pretreatment 24-hour collection 12/15/2012 with 4.9 g of total protein and positive for free lambda light chains. A proBNP was still elevated at  over 10,000 units. I believe a cardiac MRI was also going to be done but I don't have those results at time of this dictation.    Medications: reviewed  Allergies:  Allergies  Allergen Reactions  . Other Other (See Comments)    Seasonal allergies/grass/trees/dust causes watery eyes, stuffiness, sometimes swelling.    Review of Systems: Constitutional: See above. No interim infections   Respiratory: No cough or dyspnea Cardiovascular:  No chest pain or palpitations Gastrointestinal: No abdominal pain or change in bowel habit. Constipation that he had on the Velcade  has resolved. Genito-Urinary: No urinary tract symptoms Musculoskeletal: No muscle bone or joint pain Neurologic: No headache or change in vision Skin: No rash or ecchymosis Remaining ROS negative.  Physical Exam: Blood pressure 113/60, pulse 100, temperature 97.4 F (36.3 C), temperature source Oral, resp. rate 18, height 5\' 5"  (1.651 m), weight 151 lb 6.4 oz (68.675 kg), SpO2 100.00%. Wt Readings from Last 3 Encounters:  07/17/13 151 lb 6.4 oz (68.675 kg)  06/29/13 142 lb 4.8 oz (64.547 kg)  05/22/13 140 lb 9.6 oz (63.776 kg)     General appearance: Thin, malnourished-appearing Caucasian man HENNT: Periorbital purpura, pharynx no erythema exudate or ulcer Lymph nodes: No adenopathy Breasts: Lungs: Clear to auscultation resonant to percussion Heart: Regular rhythm no murmur gallop or rub Abdomen: Soft, nontender, no mass, no organomegaly Extremities: One-2+ edema, no calf tenderness Musculoskeletal: No joint deformities GU: Vascular: No carotid bruits, no cyanosis Neurologic: PERRLA, motor strength 5 over 5, reflexes absent but symmetric, sensation intact to vibration over the fingertips. 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 Skin: No rash or ecchymosis.  Lab Results: Lab Results  Component Value Date   WBC 4.0 07/17/2013   HGB 10.3* 07/17/2013   HCT 30.1* 07/17/2013   MCV 103.4*  07/17/2013   PLT 116* 07/17/2013     Chemistry      Component Value Date/Time   NA 141 07/17/2013 1004   NA 135 02/20/2013 1157   K 4.1 07/17/2013 1004   K 3.9 02/20/2013 1157   CL 100 05/22/2013 0922   CL 103 02/20/2013 1157   CO2 25 07/17/2013 1004   CO2 27 02/20/2013 1157   BUN 21.7 07/17/2013 1004   BUN 30* 02/20/2013 1157   CREATININE 0.7 07/17/2013 1004   CREATININE 1.1 02/20/2013 1157   CREATININE 0.93 12/15/2012 0832      Component Value Date/Time   CALCIUM 8.3* 07/17/2013 1004   CALCIUM 8.2* 02/20/2013 1157   ALKPHOS 59 07/17/2013 1004   ALKPHOS 80 01/21/2013 0932   AST 20 07/17/2013 1004   AST 22 01/21/2013 0932   ALT 16 07/17/2013 1004   ALT 18 01/21/2013 0932   BILITOT 0.25 07/17/2013 1004   BILITOT 0.8 01/21/2013 0932       Impression: #1. AL amyloidosis He appears to achieve a complete response to therapy outlined above. He has had some damage to his kidneys and still has nephrotic range proteinuria but the  monoclonal light chain component is no longer detectable on IFE. Serum free light chains have also normalized. Cumulative effect of treatment and development of dehydration have been a setback but off treatment for just 3 weeks he is improving.  Physicians at [facility] recommend putting him on a modified maintenance program. I would use a post transplant type Velcade maintenance Velcade 1.3 mg were squared subcutaneous every 2 weeks. I will continue steroids Decadron 40 mg every 2 weeks when he gets the Velcade. If his performance status continues to improve, he will be reevaluated for consolidation with high-dose IV melphalan.  #2. 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 Preserved ejection fraction but diastolic dysfunction.     CC:. Dr. Marcello Fennel; Dr. Patty Sermons, Dr. Abel Presto, and Dr. Leighton Ruff, MD 8/8/20145:26 PM

## 2013-07-21 ENCOUNTER — Other Ambulatory Visit: Payer: Self-pay | Admitting: Oncology

## 2013-07-22 ENCOUNTER — Telehealth: Payer: Self-pay | Admitting: *Deleted

## 2013-07-22 NOTE — Telephone Encounter (Signed)
Per staff message and POF I have scheduled appts.  JMW  

## 2013-07-24 ENCOUNTER — Ambulatory Visit (HOSPITAL_BASED_OUTPATIENT_CLINIC_OR_DEPARTMENT_OTHER): Payer: BC Managed Care – PPO

## 2013-07-24 ENCOUNTER — Other Ambulatory Visit (HOSPITAL_BASED_OUTPATIENT_CLINIC_OR_DEPARTMENT_OTHER): Payer: BC Managed Care – PPO

## 2013-07-24 ENCOUNTER — Telehealth: Payer: Self-pay | Admitting: Oncology

## 2013-07-24 VITALS — BP 106/61 | HR 85 | Temp 97.6°F

## 2013-07-24 DIAGNOSIS — E8581 Light chain (AL) amyloidosis: Secondary | ICD-10-CM

## 2013-07-24 DIAGNOSIS — R809 Proteinuria, unspecified: Secondary | ICD-10-CM

## 2013-07-24 DIAGNOSIS — Z5112 Encounter for antineoplastic immunotherapy: Secondary | ICD-10-CM

## 2013-07-24 DIAGNOSIS — R803 Bence Jones proteinuria: Secondary | ICD-10-CM

## 2013-07-24 DIAGNOSIS — E8589 Other amyloidosis: Secondary | ICD-10-CM

## 2013-07-24 LAB — COMPREHENSIVE METABOLIC PANEL (CC13)
ALT: 14 U/L (ref 0–55)
AST: 20 U/L (ref 5–34)
CO2: 25 mEq/L (ref 22–29)
Calcium: 8.2 mg/dL — ABNORMAL LOW (ref 8.4–10.4)
Chloride: 107 mEq/L (ref 98–109)
Creatinine: 0.8 mg/dL (ref 0.7–1.3)
Potassium: 4.5 mEq/L (ref 3.5–5.1)
Sodium: 140 mEq/L (ref 136–145)
Total Protein: 4.6 g/dL — ABNORMAL LOW (ref 6.4–8.3)

## 2013-07-24 LAB — CBC WITH DIFFERENTIAL/PLATELET
BASO%: 1.2 % (ref 0.0–2.0)
EOS%: 3.1 % (ref 0.0–7.0)
HCT: 30.7 % — ABNORMAL LOW (ref 38.4–49.9)
MCH: 36.6 pg — ABNORMAL HIGH (ref 27.2–33.4)
MCHC: 34.7 g/dL (ref 32.0–36.0)
MONO#: 0.5 10*3/uL (ref 0.1–0.9)
NEUT%: 71.2 % (ref 39.0–75.0)
RBC: 2.92 10*6/uL — ABNORMAL LOW (ref 4.20–5.82)
RDW: 14.8 % — ABNORMAL HIGH (ref 11.0–14.6)
WBC: 4.2 10*3/uL (ref 4.0–10.3)
lymph#: 0.6 10*3/uL — ABNORMAL LOW (ref 0.9–3.3)

## 2013-07-24 MED ORDER — ONDANSETRON HCL 8 MG PO TABS
8.0000 mg | ORAL_TABLET | Freq: Once | ORAL | Status: AC
  Administered 2013-07-24: 8 mg via ORAL

## 2013-07-24 MED ORDER — BORTEZOMIB CHEMO SQ INJECTION 3.5 MG (2.5MG/ML)
1.3000 mg/m2 | Freq: Once | INTRAMUSCULAR | Status: AC
  Administered 2013-07-24: 2.25 mg via SUBCUTANEOUS
  Filled 2013-07-24: qty 2.25

## 2013-07-24 NOTE — Patient Instructions (Signed)
[facility] Discharge Instructions for Patients Receiving Chemotherapy  Today you received the following chemotherapy agents: Velcade.  To help prevent nausea and vomiting after your treatment, we encourage you to take your nausea medication as prescribed.   If you develop nausea and vomiting that is not controlled by your nausea medication, call the clinic.   BELOW ARE SYMPTOMS THAT SHOULD BE REPORTED IMMEDIATELY:  *FEVER GREATER THAN 100.5 F  *CHILLS WITH OR WITHOUT FEVER  NAUSEA AND VOMITING THAT IS NOT CONTROLLED WITH YOUR NAUSEA MEDICATION  *UNUSUAL SHORTNESS OF BREATH  *UNUSUAL BRUISING OR BLEEDING  TENDERNESS IN MOUTH AND THROAT WITH OR WITHOUT PRESENCE OF ULCERS  *URINARY PROBLEMS  *BOWEL PROBLEMS  UNUSUAL RASH Items with * indicate a potential emergency and should be followed up as soon as possible.  Feel free to call the clinic you have any questions or concerns. The clinic phone number is [phone].    

## 2013-07-24 NOTE — Telephone Encounter (Signed)
Called pt and left message regarding appt for lab,chemo and for August and September 2014

## 2013-07-27 LAB — KAPPA/LAMBDA LIGHT CHAINS: Kappa free light chain: 1.15 mg/dL (ref 0.33–1.94)

## 2013-08-07 ENCOUNTER — Ambulatory Visit (HOSPITAL_BASED_OUTPATIENT_CLINIC_OR_DEPARTMENT_OTHER): Payer: BC Managed Care – PPO

## 2013-08-07 ENCOUNTER — Other Ambulatory Visit (HOSPITAL_BASED_OUTPATIENT_CLINIC_OR_DEPARTMENT_OTHER): Payer: BC Managed Care – PPO | Admitting: Lab

## 2013-08-07 VITALS — BP 110/65 | HR 117 | Temp 97.2°F

## 2013-08-07 DIAGNOSIS — Z5112 Encounter for antineoplastic immunotherapy: Secondary | ICD-10-CM

## 2013-08-07 DIAGNOSIS — E8581 Light chain (AL) amyloidosis: Secondary | ICD-10-CM

## 2013-08-07 DIAGNOSIS — E8589 Other amyloidosis: Secondary | ICD-10-CM

## 2013-08-07 DIAGNOSIS — R803 Bence Jones proteinuria: Secondary | ICD-10-CM

## 2013-08-07 DIAGNOSIS — R809 Proteinuria, unspecified: Secondary | ICD-10-CM

## 2013-08-07 LAB — COMPREHENSIVE METABOLIC PANEL (CC13)
Alkaline Phosphatase: 70 U/L (ref 40–150)
CO2: 21 mEq/L — ABNORMAL LOW (ref 22–29)
Creatinine: 0.8 mg/dL (ref 0.7–1.3)
Glucose: 213 mg/dl — ABNORMAL HIGH (ref 70–140)
Total Bilirubin: 0.39 mg/dL (ref 0.20–1.20)

## 2013-08-07 LAB — CBC WITH DIFFERENTIAL/PLATELET
BASO%: 0.1 % (ref 0.0–2.0)
Eosinophils Absolute: 0 10*3/uL (ref 0.0–0.5)
HCT: 34.4 % — ABNORMAL LOW (ref 38.4–49.9)
LYMPH%: 3.7 % — ABNORMAL LOW (ref 14.0–49.0)
MCHC: 34.3 g/dL (ref 32.0–36.0)
MCV: 104.8 fL — ABNORMAL HIGH (ref 79.3–98.0)
MONO#: 0.1 10*3/uL (ref 0.1–0.9)
MONO%: 0.9 % (ref 0.0–14.0)
NEUT%: 95.1 % — ABNORMAL HIGH (ref 39.0–75.0)
Platelets: 128 10*3/uL — ABNORMAL LOW (ref 140–400)
WBC: 6.6 10*3/uL (ref 4.0–10.3)

## 2013-08-07 MED ORDER — ONDANSETRON HCL 8 MG PO TABS
8.0000 mg | ORAL_TABLET | Freq: Once | ORAL | Status: AC
  Administered 2013-08-07: 8 mg via ORAL

## 2013-08-07 MED ORDER — BORTEZOMIB CHEMO SQ INJECTION 3.5 MG (2.5MG/ML)
1.3000 mg/m2 | Freq: Once | INTRAMUSCULAR | Status: AC
  Administered 2013-08-07: 2.25 mg via SUBCUTANEOUS
  Filled 2013-08-07: qty 2.25

## 2013-08-07 NOTE — Patient Instructions (Addendum)
[facility] Discharge Instructions for Patients Receiving Chemotherapy  Today you received the following chemotherapy agents velcade.  To help prevent nausea and vomiting after your treatment, we encourage you to take your nausea medication as prescribed.   If you develop nausea and vomiting that is not controlled by your nausea medication, call the clinic.   BELOW ARE SYMPTOMS THAT SHOULD BE REPORTED IMMEDIATELY:  *FEVER GREATER THAN 100.5 F  *CHILLS WITH OR WITHOUT FEVER  NAUSEA AND VOMITING THAT IS NOT CONTROLLED WITH YOUR NAUSEA MEDICATION  *UNUSUAL SHORTNESS OF BREATH  *UNUSUAL BRUISING OR BLEEDING  TENDERNESS IN MOUTH AND THROAT WITH OR WITHOUT PRESENCE OF ULCERS  *URINARY PROBLEMS  *BOWEL PROBLEMS  UNUSUAL RASH Items with * indicate a potential emergency and should be followed up as soon as possible.  Feel free to call the clinic you have any questions or concerns. The clinic phone number is [phone].    

## 2013-08-11 LAB — KAPPA/LAMBDA LIGHT CHAINS
Kappa free light chain: 0.57 mg/dL (ref 0.33–1.94)
Lambda Free Lght Chn: 0.34 mg/dL — ABNORMAL LOW (ref 0.57–2.63)

## 2013-08-21 ENCOUNTER — Ambulatory Visit (HOSPITAL_BASED_OUTPATIENT_CLINIC_OR_DEPARTMENT_OTHER): Payer: BC Managed Care – PPO

## 2013-08-21 ENCOUNTER — Ambulatory Visit (HOSPITAL_BASED_OUTPATIENT_CLINIC_OR_DEPARTMENT_OTHER): Payer: BC Managed Care – PPO | Admitting: Nurse Practitioner

## 2013-08-21 ENCOUNTER — Telehealth: Payer: Self-pay | Admitting: Oncology

## 2013-08-21 ENCOUNTER — Other Ambulatory Visit (HOSPITAL_BASED_OUTPATIENT_CLINIC_OR_DEPARTMENT_OTHER): Payer: BC Managed Care – PPO | Admitting: Lab

## 2013-08-21 VITALS — BP 117/65 | HR 108 | Temp 97.9°F | Resp 18 | Ht 65.0 in | Wt 162.2 lb

## 2013-08-21 DIAGNOSIS — R809 Proteinuria, unspecified: Secondary | ICD-10-CM

## 2013-08-21 DIAGNOSIS — R803 Bence Jones proteinuria: Secondary | ICD-10-CM

## 2013-08-21 DIAGNOSIS — E8581 Light chain (AL) amyloidosis: Secondary | ICD-10-CM

## 2013-08-21 DIAGNOSIS — Z5112 Encounter for antineoplastic immunotherapy: Secondary | ICD-10-CM

## 2013-08-21 DIAGNOSIS — E8589 Other amyloidosis: Secondary | ICD-10-CM

## 2013-08-21 DIAGNOSIS — H029 Unspecified disorder of eyelid: Secondary | ICD-10-CM

## 2013-08-21 DIAGNOSIS — I43 Cardiomyopathy in diseases classified elsewhere: Secondary | ICD-10-CM

## 2013-08-21 LAB — CBC WITH DIFFERENTIAL/PLATELET
Basophils Absolute: 0 10*3/uL (ref 0.0–0.1)
EOS%: 3.8 % (ref 0.0–7.0)
Eosinophils Absolute: 0.2 10*3/uL (ref 0.0–0.5)
HGB: 11.4 g/dL — ABNORMAL LOW (ref 13.0–17.1)
MCH: 35.6 pg — ABNORMAL HIGH (ref 27.2–33.4)
MONO#: 0.9 10*3/uL (ref 0.1–0.9)
NEUT#: 3.9 10*3/uL (ref 1.5–6.5)
RDW: 13 % (ref 11.0–14.6)
WBC: 5.7 10*3/uL (ref 4.0–10.3)
lymph#: 0.7 10*3/uL — ABNORMAL LOW (ref 0.9–3.3)

## 2013-08-21 LAB — COMPREHENSIVE METABOLIC PANEL (CC13)
AST: 20 U/L (ref 5–34)
Albumin: 2.1 g/dL — ABNORMAL LOW (ref 3.5–5.0)
BUN: 16.3 mg/dL (ref 7.0–26.0)
Calcium: 8.3 mg/dL — ABNORMAL LOW (ref 8.4–10.4)
Chloride: 107 mEq/L (ref 98–109)
Potassium: 3.9 mEq/L (ref 3.5–5.1)

## 2013-08-21 MED ORDER — ONDANSETRON HCL 8 MG PO TABS
8.0000 mg | ORAL_TABLET | Freq: Once | ORAL | Status: AC
  Administered 2013-08-21: 8 mg via ORAL

## 2013-08-21 MED ORDER — BORTEZOMIB CHEMO SQ INJECTION 3.5 MG (2.5MG/ML)
1.3000 mg/m2 | Freq: Once | INTRAMUSCULAR | Status: AC
  Administered 2013-08-21: 2.25 mg via SUBCUTANEOUS
  Filled 2013-08-21: qty 2.25

## 2013-08-21 MED ORDER — DEXAMETHASONE 4 MG PO TABS: 40.0000 mg | ORAL_TABLET | ORAL | Status: DC

## 2013-08-21 NOTE — Progress Notes (Signed)
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 There was significant elevation of the pro BNP but normal troponin levels. Normal estimated ejection fraction both on echocardiogram and cardiac MRI.  Treatment was initiated with oral Cytoxan, subcutaneous Velcade and oral dexamethasone. He initially did well on the program. In July of this year he became dehydrated, had rapid weight loss and strength loss. Treatment was placed on hold until he improved.  He was seen at [facility] in August of this year for followup. A modified maintenance program was recommended. He began Velcade 1.3 mg per meter squared subcutaneous every 2 weeks on 07/24/2013 with continuation of Decadron 40 mg also every 2 weeks.  He is seen today for scheduled followup. He is feeling much better on the every 2 week schedule. Appetite and energy  have improved significantly. He is gaining weight. He denies any pain. No fever or sweats. No skin rash. No shortness of breath. No chest pain. No constipation or diarrhea. No hematuria or dysuria. He denies any nausea or vomiting. Over the past few weeks he has noticed periodic raised, red tender lesions at the upper eyelids that drain at nighttime.   Objective: Blood pressure 117/65, pulse 108, temperature 97.9 F (36.6 C), temperature source Oral, resp. rate 18, height 5\' 5"  (1.651 m), weight 162 lb 3.2 oz (73.573 kg).  Periorbital purpura most pronounced at the upper eyelids. Both upper eyelids have a single raised erythematous lesion with crusted drainage. Lungs are clear. No wheezes or rales. Regular cardiac rhythm. No murmur. Abdomen soft and  nontender. No organomegaly. 2+ lower leg edema bilaterally. He wears a brace at the right lower leg. Motor strength 5 over 5. Vibratory sense mildly decreased over the fingertips per tuning fork exam.  Lab Results: Lab Results  Component Value Date   WBC 5.7 08/21/2013   HGB 11.4* 08/21/2013   HCT 33.1* 08/21/2013   MCV 103.0* 08/21/2013   PLT 126* 08/21/2013    Chemistry:    Chemistry      Component Value Date/Time   NA 139 08/21/2013 1322   NA 135 02/20/2013 1157   K 3.9 08/21/2013 1322   K 3.9 02/20/2013 1157   CL 100 05/22/2013 0922   CL 103 02/20/2013 1157   CO2 27 08/21/2013 1322   CO2 27 02/20/2013 1157   BUN 16.3 08/21/2013 1322   BUN 30* 02/20/2013 1157   CREATININE 0.8 08/21/2013 1322   CREATININE 1.1 02/20/2013 1157   CREATININE 0.93 12/15/2012 0832      Component Value Date/Time   CALCIUM 8.3* 08/21/2013 1322   CALCIUM 8.2* 02/20/2013 1157   ALKPHOS 65 08/21/2013 1322   ALKPHOS 80 01/21/2013 0932   AST 20 08/21/2013 1322   AST 22 01/21/2013 0932   ALT 17 08/21/2013 1322   ALT 18 01/21/2013 0932   BILITOT 0.29 08/21/2013 1322   BILITOT 0.8 01/21/2013 0932     Serum light chain analysis 08/07/2013-kappa free light chains 0.57, lambda free light chains 0.34, ratio 1.68.  Studies/Results: No results found.  Medications: I have reviewed the patient's current medications.  Assessment/Plan:  1. AL amyloidosis with specifics of diagnosis and previous treatment as outlined above. He is currently  receiving a maintenance regimen with Velcade 1.3 mg per meter squared subcutaneous every 2 weeks with Decadron 40 mg every 2 weeks. 2. 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 Preserved ejection fraction but diastolic dysfunction. 3. Eyelid lesions. Questions stye. He will contact his ophthalmologist for evaluation.  Disposition-Eugene Martinez appears well. He notes improved tolerance on the maintenance program of Velcade/dexamethasone every 2 weeks. Plan to continue the same. He will return for a  followup visit in approximately 6 weeks. He will contact the office in the interim with any problems.  Lonna Cobb ANP/GNP-BC

## 2013-08-21 NOTE — Telephone Encounter (Signed)
gv pt appt schedule for September thru November.  °

## 2013-08-24 LAB — KAPPA/LAMBDA LIGHT CHAINS: Kappa:Lambda Ratio: 0.28 (ref 0.26–1.65)

## 2013-09-04 ENCOUNTER — Other Ambulatory Visit (HOSPITAL_BASED_OUTPATIENT_CLINIC_OR_DEPARTMENT_OTHER): Payer: BC Managed Care – PPO

## 2013-09-04 ENCOUNTER — Ambulatory Visit (HOSPITAL_BASED_OUTPATIENT_CLINIC_OR_DEPARTMENT_OTHER): Payer: BC Managed Care – PPO

## 2013-09-04 ENCOUNTER — Other Ambulatory Visit: Payer: Self-pay | Admitting: Oncology

## 2013-09-04 VITALS — BP 99/59 | HR 96 | Temp 97.7°F

## 2013-09-04 DIAGNOSIS — R803 Bence Jones proteinuria: Secondary | ICD-10-CM

## 2013-09-04 DIAGNOSIS — E8589 Other amyloidosis: Secondary | ICD-10-CM

## 2013-09-04 DIAGNOSIS — Z5112 Encounter for antineoplastic immunotherapy: Secondary | ICD-10-CM

## 2013-09-04 DIAGNOSIS — E8581 Light chain (AL) amyloidosis: Secondary | ICD-10-CM

## 2013-09-04 LAB — CBC WITH DIFFERENTIAL/PLATELET
Basophils Absolute: 0 10*3/uL (ref 0.0–0.1)
EOS%: 2.4 % (ref 0.0–7.0)
HCT: 34 % — ABNORMAL LOW (ref 38.4–49.9)
HGB: 11.6 g/dL — ABNORMAL LOW (ref 13.0–17.1)
MCH: 34.1 pg — ABNORMAL HIGH (ref 27.2–33.4)
MCV: 99.9 fL — ABNORMAL HIGH (ref 79.3–98.0)
MONO%: 6.9 % (ref 0.0–14.0)
NEUT%: 80.6 % — ABNORMAL HIGH (ref 39.0–75.0)
lymph#: 0.6 10*3/uL — ABNORMAL LOW (ref 0.9–3.3)

## 2013-09-04 MED ORDER — ONDANSETRON HCL 8 MG PO TABS
8.0000 mg | ORAL_TABLET | Freq: Once | ORAL | Status: AC
  Administered 2013-09-04: 8 mg via ORAL

## 2013-09-04 MED ORDER — BORTEZOMIB CHEMO SQ INJECTION 3.5 MG (2.5MG/ML)
1.3000 mg/m2 | Freq: Once | INTRAMUSCULAR | Status: AC
  Administered 2013-09-04: 2.25 mg via SUBCUTANEOUS
  Filled 2013-09-04: qty 2.25

## 2013-09-04 MED ORDER — ONDANSETRON HCL 8 MG PO TABS
ORAL_TABLET | ORAL | Status: AC
  Filled 2013-09-04: qty 1

## 2013-09-04 NOTE — Patient Instructions (Addendum)
[facility] Discharge Instructions for Patients Receiving Chemotherapy  Today you received the following chemotherapy agents VELCADE To help prevent nausea and vomiting after your treatment, we encourage you to take your nausea medication   If you develop nausea and vomiting that is not controlled by your nausea medication, call the clinic.   BELOW ARE SYMPTOMS THAT SHOULD BE REPORTED IMMEDIATELY:  *FEVER GREATER THAN 100.5 F  *CHILLS WITH OR WITHOUT FEVER  NAUSEA AND VOMITING THAT IS NOT CONTROLLED WITH YOUR NAUSEA MEDICATION  *UNUSUAL SHORTNESS OF BREATH  *UNUSUAL BRUISING OR BLEEDING  TENDERNESS IN MOUTH AND THROAT WITH OR WITHOUT PRESENCE OF ULCERS  *URINARY PROBLEMS  *BOWEL PROBLEMS  UNUSUAL RASH Items with * indicate a potential emergency and should be followed up as soon as possible.  Feel free to call the clinic you have any questions or concerns. The clinic phone number is [phone].

## 2013-09-11 ENCOUNTER — Other Ambulatory Visit: Payer: Self-pay | Admitting: Oncology

## 2013-09-18 ENCOUNTER — Other Ambulatory Visit (HOSPITAL_BASED_OUTPATIENT_CLINIC_OR_DEPARTMENT_OTHER): Payer: BC Managed Care – PPO | Admitting: Lab

## 2013-09-18 ENCOUNTER — Ambulatory Visit (HOSPITAL_BASED_OUTPATIENT_CLINIC_OR_DEPARTMENT_OTHER): Payer: BC Managed Care – PPO

## 2013-09-18 VITALS — BP 93/56 | HR 96 | Temp 98.1°F | Resp 18

## 2013-09-18 DIAGNOSIS — Z5112 Encounter for antineoplastic immunotherapy: Secondary | ICD-10-CM

## 2013-09-18 DIAGNOSIS — R803 Bence Jones proteinuria: Secondary | ICD-10-CM

## 2013-09-18 DIAGNOSIS — R809 Proteinuria, unspecified: Secondary | ICD-10-CM

## 2013-09-18 DIAGNOSIS — E8589 Other amyloidosis: Secondary | ICD-10-CM

## 2013-09-18 DIAGNOSIS — E8581 Light chain (AL) amyloidosis: Secondary | ICD-10-CM

## 2013-09-18 LAB — COMPREHENSIVE METABOLIC PANEL (CC13)
AST: 22 U/L (ref 5–34)
Albumin: 2.3 g/dL — ABNORMAL LOW (ref 3.5–5.0)
Alkaline Phosphatase: 67 U/L (ref 40–150)
BUN: 14.6 mg/dL (ref 7.0–26.0)
Creatinine: 0.8 mg/dL (ref 0.7–1.3)
Potassium: 4 mEq/L (ref 3.5–5.1)
Total Bilirubin: 0.52 mg/dL (ref 0.20–1.20)

## 2013-09-18 LAB — CBC WITH DIFFERENTIAL/PLATELET
Basophils Absolute: 0.1 10*3/uL (ref 0.0–0.1)
EOS%: 3.4 % (ref 0.0–7.0)
HGB: 11.4 g/dL — ABNORMAL LOW (ref 13.0–17.1)
LYMPH%: 11.6 % — ABNORMAL LOW (ref 14.0–49.0)
MCH: 33.2 pg (ref 27.2–33.4)
MCV: 97.9 fL (ref 79.3–98.0)
MONO%: 12.5 % (ref 0.0–14.0)
Platelets: 123 10*3/uL — ABNORMAL LOW (ref 140–400)
RDW: 13.3 % (ref 11.0–14.6)

## 2013-09-18 MED ORDER — DEXAMETHASONE 4 MG PO TABS: 40.0000 mg | ORAL_TABLET | ORAL | Status: DC

## 2013-09-18 MED ORDER — BORTEZOMIB CHEMO SQ INJECTION 3.5 MG (2.5MG/ML)
1.3000 mg/m2 | Freq: Once | INTRAMUSCULAR | Status: AC
  Administered 2013-09-18: 2.25 mg via SUBCUTANEOUS
  Filled 2013-09-18: qty 2.25

## 2013-09-18 MED ORDER — ONDANSETRON HCL 8 MG PO TABS
8.0000 mg | ORAL_TABLET | Freq: Once | ORAL | Status: AC
  Administered 2013-09-18: 8 mg via ORAL

## 2013-09-18 MED ORDER — ONDANSETRON HCL 8 MG PO TABS
ORAL_TABLET | ORAL | Status: AC
  Filled 2013-09-18: qty 1

## 2013-09-21 LAB — KAPPA/LAMBDA LIGHT CHAINS
Kappa:Lambda Ratio: 1.53 (ref 0.26–1.65)
Lambda Free Lght Chn: 1.09 mg/dL (ref 0.57–2.63)

## 2013-09-28 ENCOUNTER — Ambulatory Visit (HOSPITAL_BASED_OUTPATIENT_CLINIC_OR_DEPARTMENT_OTHER): Payer: BC Managed Care – PPO | Admitting: Oncology

## 2013-09-28 ENCOUNTER — Telehealth: Payer: Self-pay | Admitting: Oncology

## 2013-09-28 VITALS — BP 115/68 | HR 104 | Temp 97.1°F | Resp 18 | Ht 65.0 in | Wt 161.4 lb

## 2013-09-28 DIAGNOSIS — R803 Bence Jones proteinuria: Secondary | ICD-10-CM

## 2013-09-28 DIAGNOSIS — H019 Unspecified inflammation of eyelid: Secondary | ICD-10-CM

## 2013-09-28 DIAGNOSIS — E8581 Light chain (AL) amyloidosis: Secondary | ICD-10-CM

## 2013-09-28 DIAGNOSIS — E8589 Other amyloidosis: Secondary | ICD-10-CM

## 2013-09-28 DIAGNOSIS — R634 Abnormal weight loss: Secondary | ICD-10-CM

## 2013-09-28 DIAGNOSIS — I5032 Chronic diastolic (congestive) heart failure: Secondary | ICD-10-CM

## 2013-09-28 DIAGNOSIS — I43 Cardiomyopathy in diseases classified elsewhere: Secondary | ICD-10-CM

## 2013-09-28 DIAGNOSIS — R809 Proteinuria, unspecified: Secondary | ICD-10-CM

## 2013-09-28 NOTE — Telephone Encounter (Signed)
Gave pt appt for lab,MD and chemo for October and November 2014 °

## 2013-09-29 ENCOUNTER — Telehealth: Payer: Self-pay | Admitting: Oncology

## 2013-09-29 ENCOUNTER — Telehealth: Payer: Self-pay | Admitting: *Deleted

## 2013-09-29 NOTE — Telephone Encounter (Signed)
Per staff message and POF I have scheduled appts.  JMW  

## 2013-09-29 NOTE — Telephone Encounter (Signed)
Pt  wants to move ML visit from 11/17th to 11/21 lab,ML and chemo , ok per Jan M, RN, pt aware of all appts for October , November and December 2014

## 2013-09-30 NOTE — Progress Notes (Signed)
Hematology and Oncology Follow Up Visit  Eugene Martinez [id number] August 03, 1946 67 y.o. 09/30/2013 5:54 PM   Principle Diagnosis: Encounter Diagnoses  Name Primary?  . Weight loss, unintentional   . AL amyloidosis Yes  . Proteinuria, Bence Jones   . Chronic diastolic heart failure   . Proteinuria      Interim History:  Followup visit for this 67 year old man diagnosed with primary AL amyloidosis in December 2013 when he presented with a near syncopal episode and was found to have an abnormal echocardiogram pattern suggestive of amyloidosis and nephrotic range proteinuria which was nonselective but contained monoclonal lambda light chains. He had a significant elevation of pro BNP but normal troponin levels. Normal estimated ejection fraction both on echocardiogram and cardiac MRI.  He was started on a chemotherapy program with oral Cytoxan, subcutaneous Velcade, and oral dexamethasone. He was doing very well on the program until  July when he got dehydrated, had rapid weight and strength loss. Treatment was interrupted and subsequently resumed with a modified dosing schedule giving the Velcade every 2 weeks, Decadron 40 mg every 2 weeks, and deleting the Cytoxan. He is much better since then with improved performance status. Weight has come up from 141 pounds in June to 161 pounds today. Laboratory profile remains stable with normalization of serum free light chains. Kappa lambda light chain ratio 1.53 on October 10. He has not had a recent 24 urine collection in [place] since the [facility] doctors wanted to do it there. I am going to go ahead and get one at this time here.   Medications: reviewed  Allergies:  Allergies  Allergen Reactions  . Other Other (See Comments)    Seasonal allergies/grass/trees/dust causes watery eyes, stuffiness, sometimes swelling.    Review of Systems: Hematology: negative for swollen glands, ENT ROS: negative for - oral lesions or sore throat Breast  ROS: Respiratory ROS: negative for - cough, pleuritic pain, shortness of breath or wheezing Cardiovascular ROS: negative for - chest pain, dyspnea on exertion,  irregular heartbeat, murmur, orthopnea, persistent ankle edema but decreased palpitations, paroxysmal nocturnal dyspnea or rapid heart rate Gastrointestinal ROS: negative for - abdominal pain, appetite loss, blood in stools, change in bowel habits, constipation, diarrhea, heartburn, hematemesis, melena, nausea/vomiting or swallowing difficulty/pain Genito-Urinary ROS: negative for - , dysuria, hematuria, incontinence, , nocturia or urinary frequency/urgency Musculoskeletal ROS: negative for - joint pain, joint stiffness, joint swelling, muscle pain, muscular weakness  Neurological ROS: negative for - behavioral changes, confusion, dizziness, gait disturbance, headaches, impaired coordination/balance, memory loss, numbness/tingling,  Dermatological ROS: negative for rash, ecchymosis Remaining ROS negative.  Physical Exam: Blood pressure 115/68, pulse 104, temperature 97.1 F (36.2 C), temperature source Oral, resp. rate 18, height 5\' 5"  (1.651 m), weight 161 lb 6.4 oz (73.211 kg). Wt Readings from Last 3 Encounters:  09/28/13 161 lb 6.4 oz (73.211 kg)  08/21/13 162 lb 3.2 oz (73.573 kg)  07/17/13 151 lb 6.4 oz (68.675 kg)     General appearance:  HENNT: Crusting dermatitis lower lids both eyes. Pharynx no erythema, exudate, mass, or ulcer. No thyromegaly or thyroid nodules Lymph nodes: No cervical, supraclavicular, or axillary lymphadenopathy Breasts:  Lungs: Clear to auscultation, resonant to percussion throughout Heart: Regular rhythm, no murmur, no gallop, no rub, no click, 2+ edema, stable-improved Abdomen: Soft, nontender, normal bowel sounds, no mass, no organomegaly Extremities: 2+  edema, no calf tenderness Musculoskeletal: no joint deformities GU:  Vascular: Carotid pulses 2+, no bruits,  Neurologic: Alert, oriented,  PERRLA,  cranial nerves grossly normal, motor strength 5 over 5, reflexes 1+ symmetric, upper body coordination normal, gait normal, mild decrease sensation  to vibration over the fingertips. 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 Skin: No rash, scattered ecchymosis on his arms.  Lab Results: CBC W/Diff    Component Value Date/Time   WBC 6.1 09/18/2013 1343   WBC 19.6 Repeated and verified X2.* 02/20/2013 1157   RBC 3.44* 09/18/2013 1343   RBC 4.11* 02/20/2013 1157   RBC 4.02* 10/30/2012 0911   HGB 11.4* 09/18/2013 1343   HGB 12.6* 02/20/2013 1157   HCT 33.7* 09/18/2013 1343   HCT 37.0* 02/20/2013 1157   PLT 123* 09/18/2013 1343   PLT 146.0* 02/20/2013 1157   MCV 97.9 09/18/2013 1343   MCV 90.0 02/20/2013 1157   MCH 33.2 09/18/2013 1343   MCH 30.5 01/24/2013 0500   MCHC 34.0 09/18/2013 1343   MCHC 34.2 02/20/2013 1157   RDW 13.3 09/18/2013 1343   RDW 14.3 02/20/2013 1157   LYMPHSABS 0.7* 09/18/2013 1343   LYMPHSABS 1.5 02/20/2013 1157   MONOABS 0.8 09/18/2013 1343   MONOABS 1.5* 02/20/2013 1157   EOSABS 0.2 09/18/2013 1343   EOSABS 0.0 02/20/2013 1157   BASOSABS 0.1 09/18/2013 1343   BASOSABS 0.0 02/20/2013 1157     Chemistry      Component Value Date/Time   NA 139 09/18/2013 1343   NA 135 02/20/2013 1157   K 4.0 09/18/2013 1343   K 3.9 02/20/2013 1157   CL 100 05/22/2013 0922   CL 103 02/20/2013 1157   CO2 26 09/18/2013 1343   CO2 27 02/20/2013 1157   BUN 14.6 09/18/2013 1343   BUN 30* 02/20/2013 1157   CREATININE 0.8 09/18/2013 1343   CREATININE 1.1 02/20/2013 1157   CREATININE 0.93 12/15/2012 0832      Component Value Date/Time   CALCIUM 8.4 09/18/2013 1343   CALCIUM 8.2* 02/20/2013 1157   ALKPHOS 67 09/18/2013 1343   ALKPHOS 80 01/21/2013 0932   AST 22 09/18/2013 1343   AST 22 01/21/2013 0932   ALT 15 09/18/2013 1343   ALT 18 01/21/2013 0932   BILITOT 0.52 09/18/2013 1343   BILITOT 0.8 01/21/2013 0932      Impression:  #1. AL  amyloidosis responding to treatment  outlined above. I will continue current treatment until his followup at [facility] later this month. He is under evaluation for consolidation with high-dose IV melphalan.  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 I'm going to give him a trial of sulfa eye drops.    CC: Patient Care Team: Barbette Reichmann, MD as PCP - General (Internal Medicine)   Levert Feinstein, MD 10/22/20145:54 PM

## 2013-10-02 ENCOUNTER — Other Ambulatory Visit (HOSPITAL_BASED_OUTPATIENT_CLINIC_OR_DEPARTMENT_OTHER): Payer: BC Managed Care – PPO | Admitting: Lab

## 2013-10-02 ENCOUNTER — Ambulatory Visit (HOSPITAL_BASED_OUTPATIENT_CLINIC_OR_DEPARTMENT_OTHER): Payer: BC Managed Care – PPO

## 2013-10-02 VITALS — BP 113/60 | HR 105 | Temp 98.1°F | Resp 18

## 2013-10-02 DIAGNOSIS — R803 Bence Jones proteinuria: Secondary | ICD-10-CM

## 2013-10-02 DIAGNOSIS — E8589 Other amyloidosis: Secondary | ICD-10-CM

## 2013-10-02 DIAGNOSIS — R809 Proteinuria, unspecified: Secondary | ICD-10-CM

## 2013-10-02 DIAGNOSIS — E8581 Light chain (AL) amyloidosis: Secondary | ICD-10-CM

## 2013-10-02 DIAGNOSIS — Z5112 Encounter for antineoplastic immunotherapy: Secondary | ICD-10-CM

## 2013-10-02 LAB — CBC WITH DIFFERENTIAL/PLATELET
Basophils Absolute: 0 10*3/uL (ref 0.0–0.1)
EOS%: 1.4 % (ref 0.0–7.0)
Eosinophils Absolute: 0.1 10*3/uL (ref 0.0–0.5)
HCT: 34.7 % — ABNORMAL LOW (ref 38.4–49.9)
HGB: 11.5 g/dL — ABNORMAL LOW (ref 13.0–17.1)
MCH: 31.9 pg (ref 27.2–33.4)
MCV: 96 fL (ref 79.3–98.0)
MONO#: 0.3 10*3/uL (ref 0.1–0.9)
MONO%: 3.8 % (ref 0.0–14.0)
NEUT#: 7.3 10*3/uL — ABNORMAL HIGH (ref 1.5–6.5)
NEUT%: 89 % — ABNORMAL HIGH (ref 39.0–75.0)
RDW: 13.4 % (ref 11.0–14.6)
WBC: 8.2 10*3/uL (ref 4.0–10.3)
lymph#: 0.4 10*3/uL — ABNORMAL LOW (ref 0.9–3.3)

## 2013-10-02 MED ORDER — ONDANSETRON HCL 8 MG PO TABS
ORAL_TABLET | ORAL | Status: AC
  Filled 2013-10-02: qty 1

## 2013-10-02 MED ORDER — DEXAMETHASONE 4 MG PO TABS: 40.0000 mg | ORAL_TABLET | ORAL | Status: DC

## 2013-10-02 MED ORDER — BORTEZOMIB CHEMO SQ INJECTION 3.5 MG (2.5MG/ML)
1.3000 mg/m2 | Freq: Once | INTRAMUSCULAR | Status: AC
  Administered 2013-10-02: 2.25 mg via SUBCUTANEOUS
  Filled 2013-10-02: qty 2.25

## 2013-10-02 MED ORDER — ONDANSETRON HCL 8 MG PO TABS
8.0000 mg | ORAL_TABLET | Freq: Once | ORAL | Status: AC
  Administered 2013-10-02: 8 mg via ORAL

## 2013-10-02 NOTE — Patient Instructions (Signed)
Bortezomib injection (Velcade) What is this medicine? BORTEZOMIB (bor TEZ oh mib) is a chemotherapy drug. It slows the growth of cancer cells. This medicine is used to treat multiple myeloma, lymphoma, and other cancers. This medicine may be used for other purposes; ask your health care provider or pharmacist if you have questions. What should I tell my health care provider before I take this medicine? They need to know if you have any of these conditions: -heart disease -irregular heartbeat -liver disease -low blood counts, like low white blood cells, platelets, or hemoglobin -peripheral neuropathy -taking medicine for blood pressure -an unusual or allergic reaction to bortezomib, mannitol, boron, other medicines, foods, dyes, or preservatives -pregnant or trying to get pregnant -breast-feeding How should I use this medicine? This medicine is for injection into a vein or for injection under the skin. It is given by a health care professional in a hospital or clinic setting. Talk to your pediatrician regarding the use of this medicine in children. Special care may be needed. Overdosage: If you think you have taken too much of this medicine contact a poison control center or emergency room at once. NOTE: This medicine is only for you. Do not share this medicine with others. What if I miss a dose? It is important not to miss your dose. Call your doctor or health care professional if you are unable to keep an appointment. What may interact with this medicine? -medicines for diabetes -medicines to increase blood counts like filgrastim, pegfilgrastim, sargramostim -zalcitabine Talk to your doctor or health care professional before taking any of these medicines: -acetaminophen -aspirin -ibuprofen -ketoprofen -naproxen This list may not describe all possible interactions. Give your health care provider a list of all the medicines, herbs, non-prescription drugs, or dietary supplements you  use. Also tell them if you smoke, drink alcohol, or use illegal drugs. Some items may interact with your medicine. What should I watch for while using this medicine? Visit your doctor for checks on your progress. This drug may make you feel generally unwell. This is not uncommon, as chemotherapy can affect healthy cells as well as cancer cells. Report any side effects. Continue your course of treatment even though you feel ill unless your doctor tells you to stop. You may get drowsy or dizzy. Do not drive, use machinery, or do anything that needs mental alertness until you know how this medicine affects you. Do not stand or sit up quickly, especially if you are an older patient. This reduces the risk of dizzy or fainting spells. In some cases, you may be given additional medicines to help with side effects. Follow all directions for their use. Call your doctor or health care professional for advice if you get a fever, chills or sore throat, or other symptoms of a cold or flu. Do not treat yourself. This drug decreases your body's ability to fight infections. Try to avoid being around people who are sick. This medicine may increase your risk to bruise or bleed. Call your doctor or health care professional if you notice any unusual bleeding. Be careful brushing and flossing your teeth or using a toothpick because you may get an infection or bleed more easily. If you have any dental work done, tell your dentist you are receiving this medicine. Avoid taking products that contain aspirin, acetaminophen, ibuprofen, naproxen, or ketoprofen unless instructed by your doctor. These medicines may hide a fever. Do not become pregnant while taking this medicine. Women should inform their doctor if they wish  to become pregnant or think they might be pregnant. There is a potential for serious side effects to an unborn child. Talk to your health care professional or pharmacist for more information. Do not breast-feed an  infant while taking this medicine. You may have vomiting or diarrhea while taking this medicine. Drink water or other fluids as directed. What side effects may I notice from receiving this medicine? Side effects that you should report to your doctor or health care professional as soon as possible: -allergic reactions like skin rash, itching or hives, swelling of the face, lips, or tongue -breathing problems -changes in hearing -changes in vision -fast, irregular heartbeat -feeling faint or lightheaded, falls -pain, tingling, numbness in the hands or feet -seizures -swelling of the ankles, feet, hands -unusual bleeding or bruising -unusually weak or tired -vomiting Side effects that usually do not require medical attention (report to your doctor or health care professional if they continue or are bothersome): -changes in emotions or moods -constipation -diarrhea -loss of appetite -headache -irritation at site where injected -nausea This list may not describe all possible side effects. Call your doctor for medical advice about side effects. You may report side effects to FDA at 1-800-FDA-1088. Where should I keep my medicine? This drug is given in a hospital or clinic and will not be stored at home. NOTE: This sheet is a summary. It may not cover all possible information. If you have questions about this medicine, talk to your doctor, pharmacist, or health care provider.  2013, [employer]. (01/03/2011 11:42:36 AM)

## 2013-10-06 LAB — UIFE/LIGHT CHAINS/TP QN, 24-HR UR
Albumin, U: DETECTED
Free Kappa Lt Chains,Ur: 0.79 mg/dL (ref 0.14–2.42)
Free Kappa/Lambda Ratio: 4.39 ratio (ref 2.04–10.37)
Free Lambda Excretion/Day: 7.52 mg/d
Total Protein, Urine: 91.4 mg/dL

## 2013-10-09 ENCOUNTER — Telehealth: Payer: Self-pay | Admitting: *Deleted

## 2013-10-09 NOTE — Telephone Encounter (Signed)
Called patient regarding urine protein is still 3.8 grams but down from 4.8 grams, per Dr. Cyndie Chime.  Patient verbalized understanding.

## 2013-10-13 DIAGNOSIS — D239 Other benign neoplasm of skin, unspecified: Secondary | ICD-10-CM

## 2013-10-13 HISTORY — DX: Other benign neoplasm of skin, unspecified: D23.9

## 2013-10-15 ENCOUNTER — Other Ambulatory Visit: Payer: Self-pay

## 2013-10-15 ENCOUNTER — Other Ambulatory Visit: Payer: Self-pay | Admitting: Oncology

## 2013-10-16 ENCOUNTER — Other Ambulatory Visit (HOSPITAL_BASED_OUTPATIENT_CLINIC_OR_DEPARTMENT_OTHER): Payer: BC Managed Care – PPO | Admitting: Lab

## 2013-10-16 ENCOUNTER — Ambulatory Visit (HOSPITAL_BASED_OUTPATIENT_CLINIC_OR_DEPARTMENT_OTHER): Payer: BC Managed Care – PPO

## 2013-10-16 DIAGNOSIS — E8581 Light chain (AL) amyloidosis: Secondary | ICD-10-CM

## 2013-10-16 DIAGNOSIS — R803 Bence Jones proteinuria: Secondary | ICD-10-CM

## 2013-10-16 DIAGNOSIS — E8589 Other amyloidosis: Secondary | ICD-10-CM

## 2013-10-16 DIAGNOSIS — Z5112 Encounter for antineoplastic immunotherapy: Secondary | ICD-10-CM

## 2013-10-16 LAB — CBC WITH DIFFERENTIAL/PLATELET
BASO%: 0.2 % (ref 0.0–2.0)
Basophils Absolute: 0 10*3/uL (ref 0.0–0.1)
EOS%: 0.9 % (ref 0.0–7.0)
HCT: 36.7 % — ABNORMAL LOW (ref 38.4–49.9)
HGB: 12 g/dL — ABNORMAL LOW (ref 13.0–17.1)
LYMPH%: 4.2 % — ABNORMAL LOW (ref 14.0–49.0)
MCHC: 32.9 g/dL (ref 32.0–36.0)
MONO#: 0.2 10*3/uL (ref 0.1–0.9)
MONO%: 2.1 % (ref 0.0–14.0)
NEUT%: 92.6 % — ABNORMAL HIGH (ref 39.0–75.0)
RDW: 13.6 % (ref 11.0–14.6)
WBC: 8.6 10*3/uL (ref 4.0–10.3)
lymph#: 0.4 10*3/uL — ABNORMAL LOW (ref 0.9–3.3)

## 2013-10-16 LAB — COMPREHENSIVE METABOLIC PANEL (CC13)
ALT: 17 U/L (ref 0–55)
AST: 23 U/L (ref 5–34)
Albumin: 2.5 g/dL — ABNORMAL LOW (ref 3.5–5.0)
Anion Gap: 9 mEq/L (ref 3–11)
CO2: 23 mEq/L (ref 22–29)
Calcium: 8.9 mg/dL (ref 8.4–10.4)
Chloride: 108 mEq/L (ref 98–109)
Potassium: 4.2 mEq/L (ref 3.5–5.1)
Sodium: 140 mEq/L (ref 136–145)
Total Bilirubin: 0.53 mg/dL (ref 0.20–1.20)
Total Protein: 5.3 g/dL — ABNORMAL LOW (ref 6.4–8.3)

## 2013-10-16 MED ORDER — ONDANSETRON HCL 8 MG PO TABS
ORAL_TABLET | ORAL | Status: AC
  Filled 2013-10-16: qty 1

## 2013-10-16 MED ORDER — BORTEZOMIB CHEMO SQ INJECTION 3.5 MG (2.5MG/ML)
1.3000 mg/m2 | Freq: Once | INTRAMUSCULAR | Status: AC
  Administered 2013-10-16: 2.25 mg via SUBCUTANEOUS
  Filled 2013-10-16: qty 2.25

## 2013-10-16 MED ORDER — ONDANSETRON HCL 8 MG PO TABS
8.0000 mg | ORAL_TABLET | Freq: Once | ORAL | Status: AC
  Administered 2013-10-16: 8 mg via ORAL

## 2013-10-16 NOTE — Patient Instructions (Signed)
[facility] Discharge Instructions for Patients Receiving Chemotherapy  Today you received the following chemotherapy agents: Velcade. To help prevent nausea and vomiting after your treatment, we encourage you to take your nausea medication.  If you develop nausea and vomiting that is not controlled by your nausea medication, call the clinic.   BELOW ARE SYMPTOMS THAT SHOULD BE REPORTED IMMEDIATELY:  *FEVER GREATER THAN 100.5 F  *CHILLS WITH OR WITHOUT FEVER  NAUSEA AND VOMITING THAT IS NOT CONTROLLED WITH YOUR NAUSEA MEDICATION  *UNUSUAL SHORTNESS OF BREATH  *UNUSUAL BRUISING OR BLEEDING  TENDERNESS IN MOUTH AND THROAT WITH OR WITHOUT PRESENCE OF ULCERS  *URINARY PROBLEMS  *BOWEL PROBLEMS  UNUSUAL RASH Items with * indicate a potential emergency and should be followed up as soon as possible.  Feel free to call the clinic you have any questions or concerns. The clinic phone number is [phone].    

## 2013-10-26 ENCOUNTER — Ambulatory Visit: Payer: BC Managed Care – PPO | Admitting: Nurse Practitioner

## 2013-10-28 ENCOUNTER — Telehealth: Payer: Self-pay | Admitting: Oncology

## 2013-10-28 NOTE — Telephone Encounter (Signed)
Sent lab to Dr. Eddie Candle office from Dr. Cyndie Chime

## 2013-10-30 ENCOUNTER — Telehealth: Payer: Self-pay | Admitting: *Deleted

## 2013-10-30 ENCOUNTER — Telehealth: Payer: Self-pay | Admitting: Oncology

## 2013-10-30 ENCOUNTER — Other Ambulatory Visit (HOSPITAL_BASED_OUTPATIENT_CLINIC_OR_DEPARTMENT_OTHER): Payer: BC Managed Care – PPO | Admitting: Lab

## 2013-10-30 ENCOUNTER — Ambulatory Visit (HOSPITAL_BASED_OUTPATIENT_CLINIC_OR_DEPARTMENT_OTHER): Payer: BC Managed Care – PPO

## 2013-10-30 ENCOUNTER — Ambulatory Visit (HOSPITAL_BASED_OUTPATIENT_CLINIC_OR_DEPARTMENT_OTHER): Payer: BC Managed Care – PPO | Admitting: Nurse Practitioner

## 2013-10-30 VITALS — BP 109/61 | HR 101 | Temp 97.3°F | Resp 18 | Ht 65.0 in | Wt 164.3 lb

## 2013-10-30 DIAGNOSIS — Z5112 Encounter for antineoplastic immunotherapy: Secondary | ICD-10-CM

## 2013-10-30 DIAGNOSIS — H029 Unspecified disorder of eyelid: Secondary | ICD-10-CM

## 2013-10-30 DIAGNOSIS — E8589 Other amyloidosis: Secondary | ICD-10-CM

## 2013-10-30 DIAGNOSIS — E8581 Light chain (AL) amyloidosis: Secondary | ICD-10-CM

## 2013-10-30 DIAGNOSIS — R809 Proteinuria, unspecified: Secondary | ICD-10-CM

## 2013-10-30 DIAGNOSIS — I428 Other cardiomyopathies: Secondary | ICD-10-CM

## 2013-10-30 DIAGNOSIS — R803 Bence Jones proteinuria: Secondary | ICD-10-CM

## 2013-10-30 LAB — CBC WITH DIFFERENTIAL/PLATELET
BASO%: 0.6 % (ref 0.0–2.0)
Basophils Absolute: 0 10*3/uL (ref 0.0–0.1)
EOS%: 4.1 % (ref 0.0–7.0)
HCT: 33.7 % — ABNORMAL LOW (ref 38.4–49.9)
HGB: 11.1 g/dL — ABNORMAL LOW (ref 13.0–17.1)
LYMPH%: 8.7 % — ABNORMAL LOW (ref 14.0–49.0)
MCH: 31 pg (ref 27.2–33.4)
MCHC: 32.9 g/dL (ref 32.0–36.0)
MCV: 94.2 fL (ref 79.3–98.0)
MONO#: 0.4 10*3/uL (ref 0.1–0.9)
MONO%: 6.9 % (ref 0.0–14.0)
NEUT%: 79.7 % — ABNORMAL HIGH (ref 39.0–75.0)
Platelets: 117 10*3/uL — ABNORMAL LOW (ref 140–400)
RBC: 3.58 10*6/uL — ABNORMAL LOW (ref 4.20–5.82)
lymph#: 0.5 10*3/uL — ABNORMAL LOW (ref 0.9–3.3)

## 2013-10-30 MED ORDER — ONDANSETRON HCL 8 MG PO TABS
ORAL_TABLET | ORAL | Status: AC
  Filled 2013-10-30: qty 1

## 2013-10-30 MED ORDER — ONDANSETRON HCL 8 MG PO TABS
8.0000 mg | ORAL_TABLET | Freq: Once | ORAL | Status: AC
  Administered 2013-10-30: 8 mg via ORAL

## 2013-10-30 MED ORDER — BORTEZOMIB CHEMO SQ INJECTION 3.5 MG (2.5MG/ML)
1.3000 mg/m2 | Freq: Once | INTRAMUSCULAR | Status: AC
  Administered 2013-10-30: 2.25 mg via SUBCUTANEOUS
  Filled 2013-10-30: qty 2.25

## 2013-10-30 NOTE — Progress Notes (Signed)
OFFICE PROGRESS NOTE  Interval history:  [name] is a 67 year old man diagnosed with primary AL amyloidosis in December 2013. He was initially treated with oral Cytoxan, subcutaneous Velcade and dexamethasone. He did well until July when he became dehydrated and had rapid weight and strength loss. Treatment was interrupted and subsequently modified with the Cytoxan deleted and Velcade and Decadron continued every 2 weeks. He improved significantly on the modified regimen.  He continues to feel well. He reports a good appetite and good energy level. No nausea or vomiting. No mouth sores. No diarrhea. He takes a stool softener as needed. He denies any numbness or tingling in his hands or feet. He reports mild leg edema. He takes a diuretic based on his weight.  He has noted improvement of the irritation of his eyelids with the sulfa eyedrops.    Objective: Blood pressure 109/61, pulse 101, temperature 97.3 F (36.3 C), temperature source Oral, resp. rate 18, height 5\' 5"  (1.651 m), weight 164 lb 4.8 oz (74.526 kg).  No thrush or ulceration. No palpable cervical, supraclavicular or axillary lymph nodes. Lungs are clear. No wheezes or rales. Regular cardiac rhythm. Abdomen soft and nontender. No organomegaly. 1+ pitting pretibial edema. Motor strength intact. No skin rash.  Lab Results: Lab Results  Component Value Date   WBC 5.9 10/30/2013   HGB 11.1* 10/30/2013   HCT 33.7* 10/30/2013   MCV 94.2 10/30/2013   PLT 117* 10/30/2013    Chemistry:    Chemistry      Component Value Date/Time   NA 140 10/16/2013 1334   NA 135 02/20/2013 1157   K 4.2 10/16/2013 1334   K 3.9 02/20/2013 1157   CL 100 05/22/2013 0922   CL 103 02/20/2013 1157   CO2 23 10/16/2013 1334   CO2 27 02/20/2013 1157   BUN 17.6 10/16/2013 1334   BUN 30* 02/20/2013 1157   CREATININE 0.8 10/16/2013 1334   CREATININE 1.1 02/20/2013 1157   CREATININE 0.93 12/15/2012 0832      Component Value Date/Time   CALCIUM 8.9  10/16/2013 1334   CALCIUM 8.2* 02/20/2013 1157   ALKPHOS 71 10/16/2013 1334   ALKPHOS 80 01/21/2013 0932   AST 23 10/16/2013 1334   AST 22 01/21/2013 0932   ALT 17 10/16/2013 1334   ALT 18 01/21/2013 0932   BILITOT 0.53 10/16/2013 1334   BILITOT 0.8 01/21/2013 0932    Serum free light chains 10/16/2013-kappa 1.08, lambda 1.02, ratio 1.06.   Studies/Results: No results found.  Medications: I have reviewed the patient's current medications.  Assessment/Plan:  1. AL amyloidosis with ongoing response to treatment. 2. 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 3. 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 4. Chronic irritation of the eyelids. Improved with sulfa eyedrops.  Disposition- He appears well. Plan to continue every 2 week Velcade/dexamethasone. He has a followup appointment Dr. Barbaraann Martinez in December. He will return for a followup visit with Dr. Cyndie Martinez in early January.  Lonna Cobb ANP/GNP-BC

## 2013-10-30 NOTE — Telephone Encounter (Signed)
worked 11/21 POF AVS and CAL taken to pt in Tx Room shh

## 2013-10-30 NOTE — Telephone Encounter (Signed)
Per staff message and POF I have scheduled appts.  JMW  

## 2013-10-30 NOTE — Patient Instructions (Signed)
[facility] Discharge Instructions for Patients Receiving Chemotherapy  Today you received the following chemotherapy agents velcade.  To help prevent nausea and vomiting after your treatment, we encourage you to take your nausea medication zofran.   If you develop nausea and vomiting that is not controlled by your nausea medication, call the clinic.   BELOW ARE SYMPTOMS THAT SHOULD BE REPORTED IMMEDIATELY:  *FEVER GREATER THAN 100.5 F  *CHILLS WITH OR WITHOUT FEVER  NAUSEA AND VOMITING THAT IS NOT CONTROLLED WITH YOUR NAUSEA MEDICATION  *UNUSUAL SHORTNESS OF BREATH  *UNUSUAL BRUISING OR BLEEDING  TENDERNESS IN MOUTH AND THROAT WITH OR WITHOUT PRESENCE OF ULCERS  *URINARY PROBLEMS  *BOWEL PROBLEMS  UNUSUAL RASH Items with * indicate a potential emergency and should be followed up as soon as possible.  Feel free to call the clinic you have any questions or concerns. The clinic phone number is [phone].    

## 2013-11-13 ENCOUNTER — Ambulatory Visit (HOSPITAL_BASED_OUTPATIENT_CLINIC_OR_DEPARTMENT_OTHER): Payer: BC Managed Care – PPO

## 2013-11-13 ENCOUNTER — Other Ambulatory Visit (HOSPITAL_BASED_OUTPATIENT_CLINIC_OR_DEPARTMENT_OTHER): Payer: BC Managed Care – PPO | Admitting: Lab

## 2013-11-13 VITALS — BP 122/65 | HR 96 | Temp 98.1°F

## 2013-11-13 DIAGNOSIS — R803 Bence Jones proteinuria: Secondary | ICD-10-CM

## 2013-11-13 DIAGNOSIS — E8589 Other amyloidosis: Secondary | ICD-10-CM

## 2013-11-13 DIAGNOSIS — E8581 Light chain (AL) amyloidosis: Secondary | ICD-10-CM

## 2013-11-13 DIAGNOSIS — Z5112 Encounter for antineoplastic immunotherapy: Secondary | ICD-10-CM

## 2013-11-13 LAB — CBC WITH DIFFERENTIAL/PLATELET
BASO%: 0.3 % (ref 0.0–2.0)
EOS%: 2.7 % (ref 0.0–7.0)
Eosinophils Absolute: 0.2 10*3/uL (ref 0.0–0.5)
LYMPH%: 5.4 % — ABNORMAL LOW (ref 14.0–49.0)
MCH: 30.5 pg (ref 27.2–33.4)
MCHC: 32.6 g/dL (ref 32.0–36.0)
MCV: 93.8 fL (ref 79.3–98.0)
MONO#: 0.4 10*3/uL (ref 0.1–0.9)
NEUT%: 86.3 % — ABNORMAL HIGH (ref 39.0–75.0)
Platelets: 126 10*3/uL — ABNORMAL LOW (ref 140–400)
RBC: 3.76 10*6/uL — ABNORMAL LOW (ref 4.20–5.82)
WBC: 7.2 10*3/uL (ref 4.0–10.3)
lymph#: 0.4 10*3/uL — ABNORMAL LOW (ref 0.9–3.3)

## 2013-11-13 LAB — COMPREHENSIVE METABOLIC PANEL (CC13)
ALT: 25 U/L (ref 0–55)
AST: 32 U/L (ref 5–34)
Alkaline Phosphatase: 77 U/L (ref 40–150)
Anion Gap: 7 mEq/L (ref 3–11)
CO2: 22 mEq/L (ref 22–29)
Creatinine: 0.9 mg/dL (ref 0.7–1.3)
Sodium: 138 mEq/L (ref 136–145)
Total Bilirubin: 0.43 mg/dL (ref 0.20–1.20)
Total Protein: 5.1 g/dL — ABNORMAL LOW (ref 6.4–8.3)

## 2013-11-13 MED ORDER — BORTEZOMIB CHEMO SQ INJECTION 3.5 MG (2.5MG/ML)
1.3000 mg/m2 | Freq: Once | INTRAMUSCULAR | Status: AC
  Administered 2013-11-13: 2.25 mg via SUBCUTANEOUS
  Filled 2013-11-13: qty 2.25

## 2013-11-13 MED ORDER — ONDANSETRON HCL 8 MG PO TABS
ORAL_TABLET | ORAL | Status: AC
  Filled 2013-11-13: qty 1

## 2013-11-13 MED ORDER — ONDANSETRON HCL 8 MG PO TABS
8.0000 mg | ORAL_TABLET | Freq: Once | ORAL | Status: AC
  Administered 2013-11-13: 8 mg via ORAL

## 2013-11-13 NOTE — Patient Instructions (Signed)
[facility] Discharge Instructions for Patients Receiving Chemotherapy  Today you received the following chemotherapy agents: Velcade.  To help prevent nausea and vomiting after your treatment, we encourage you to take your nausea medication as prescribed.   If you develop nausea and vomiting that is not controlled by your nausea medication, call the clinic.   BELOW ARE SYMPTOMS THAT SHOULD BE REPORTED IMMEDIATELY:  *FEVER GREATER THAN 100.5 F  *CHILLS WITH OR WITHOUT FEVER  NAUSEA AND VOMITING THAT IS NOT CONTROLLED WITH YOUR NAUSEA MEDICATION  *UNUSUAL SHORTNESS OF BREATH  *UNUSUAL BRUISING OR BLEEDING  TENDERNESS IN MOUTH AND THROAT WITH OR WITHOUT PRESENCE OF ULCERS  *URINARY PROBLEMS  *BOWEL PROBLEMS  UNUSUAL RASH Items with * indicate a potential emergency and should be followed up as soon as possible.  Feel free to call the clinic you have any questions or concerns. The clinic phone number is [phone].    

## 2013-11-16 LAB — KAPPA/LAMBDA LIGHT CHAINS: Kappa:Lambda Ratio: 1.03 (ref 0.26–1.65)

## 2013-11-26 ENCOUNTER — Other Ambulatory Visit: Payer: Self-pay | Admitting: Oncology

## 2013-11-27 ENCOUNTER — Ambulatory Visit (HOSPITAL_BASED_OUTPATIENT_CLINIC_OR_DEPARTMENT_OTHER): Payer: BC Managed Care – PPO

## 2013-11-27 ENCOUNTER — Other Ambulatory Visit (HOSPITAL_BASED_OUTPATIENT_CLINIC_OR_DEPARTMENT_OTHER): Payer: BC Managed Care – PPO

## 2013-11-27 VITALS — BP 122/77 | HR 94 | Temp 97.3°F | Resp 18

## 2013-11-27 DIAGNOSIS — E8589 Other amyloidosis: Secondary | ICD-10-CM

## 2013-11-27 DIAGNOSIS — R803 Bence Jones proteinuria: Secondary | ICD-10-CM

## 2013-11-27 DIAGNOSIS — E8581 Light chain (AL) amyloidosis: Secondary | ICD-10-CM

## 2013-11-27 DIAGNOSIS — Z5112 Encounter for antineoplastic immunotherapy: Secondary | ICD-10-CM

## 2013-11-27 LAB — CBC WITH DIFFERENTIAL/PLATELET
Basophils Absolute: 0 10*3/uL (ref 0.0–0.1)
Eosinophils Absolute: 0.2 10*3/uL (ref 0.0–0.5)
HCT: 34.5 % — ABNORMAL LOW (ref 38.4–49.9)
HGB: 11.7 g/dL — ABNORMAL LOW (ref 13.0–17.1)
MONO#: 0.3 10*3/uL (ref 0.1–0.9)
NEUT#: 6.1 10*3/uL (ref 1.5–6.5)
RBC: 3.7 10*6/uL — ABNORMAL LOW (ref 4.20–5.82)
RDW: 15.2 % — ABNORMAL HIGH (ref 11.0–14.6)
WBC: 7.1 10*3/uL (ref 4.0–10.3)
lymph#: 0.4 10*3/uL — ABNORMAL LOW (ref 0.9–3.3)

## 2013-11-27 MED ORDER — ONDANSETRON HCL 8 MG PO TABS
8.0000 mg | ORAL_TABLET | Freq: Once | ORAL | Status: AC
  Administered 2013-11-27: 8 mg via ORAL

## 2013-11-27 MED ORDER — BORTEZOMIB CHEMO SQ INJECTION 3.5 MG (2.5MG/ML)
1.3000 mg/m2 | Freq: Once | INTRAMUSCULAR | Status: AC
  Administered 2013-11-27: 2.25 mg via SUBCUTANEOUS
  Filled 2013-11-27: qty 2.25

## 2013-11-27 MED ORDER — ONDANSETRON HCL 8 MG PO TABS
ORAL_TABLET | ORAL | Status: AC
  Filled 2013-11-27: qty 1

## 2013-11-27 NOTE — Patient Instructions (Signed)
[facility] Discharge Instructions for Patients Receiving Chemotherapy  Today you received the following chemotherapy agent: Velcade   To help prevent nausea and vomiting after your treatment, we encourage you to take your nausea medication as prescribed.    If you develop nausea and vomiting that is not controlled by your nausea medication, call the clinic.   BELOW ARE SYMPTOMS THAT SHOULD BE REPORTED IMMEDIATELY:  *FEVER GREATER THAN 100.5 F  *CHILLS WITH OR WITHOUT FEVER  NAUSEA AND VOMITING THAT IS NOT CONTROLLED WITH YOUR NAUSEA MEDICATION  *UNUSUAL SHORTNESS OF BREATH  *UNUSUAL BRUISING OR BLEEDING  TENDERNESS IN MOUTH AND THROAT WITH OR WITHOUT PRESENCE OF ULCERS  *URINARY PROBLEMS  *BOWEL PROBLEMS  UNUSUAL RASH Items with * indicate a potential emergency and should be followed up as soon as possible.  Feel free to call the clinic you have any questions or concerns. The clinic phone number is [phone].    

## 2013-12-01 ENCOUNTER — Other Ambulatory Visit: Payer: Self-pay | Admitting: *Deleted

## 2013-12-01 MED ORDER — SULFACETAMIDE SODIUM 10 % OP SOLN: 2.0000 [drp] | OPHTHALMIC | Status: DC

## 2013-12-11 ENCOUNTER — Ambulatory Visit (HOSPITAL_BASED_OUTPATIENT_CLINIC_OR_DEPARTMENT_OTHER): Payer: BC Managed Care – PPO

## 2013-12-11 ENCOUNTER — Ambulatory Visit (HOSPITAL_BASED_OUTPATIENT_CLINIC_OR_DEPARTMENT_OTHER): Payer: BC Managed Care – PPO | Admitting: Oncology

## 2013-12-11 ENCOUNTER — Telehealth: Payer: Self-pay | Admitting: Oncology

## 2013-12-11 ENCOUNTER — Other Ambulatory Visit (HOSPITAL_BASED_OUTPATIENT_CLINIC_OR_DEPARTMENT_OTHER): Payer: BC Managed Care – PPO

## 2013-12-11 VITALS — BP 112/66 | HR 93 | Temp 97.3°F | Resp 18 | Ht 65.0 in | Wt 154.8 lb

## 2013-12-11 DIAGNOSIS — E8581 Light chain (AL) amyloidosis: Secondary | ICD-10-CM

## 2013-12-11 DIAGNOSIS — R803 Bence Jones proteinuria: Secondary | ICD-10-CM

## 2013-12-11 DIAGNOSIS — I43 Cardiomyopathy in diseases classified elsewhere: Secondary | ICD-10-CM

## 2013-12-11 DIAGNOSIS — R809 Proteinuria, unspecified: Secondary | ICD-10-CM

## 2013-12-11 DIAGNOSIS — E8589 Other amyloidosis: Secondary | ICD-10-CM

## 2013-12-11 DIAGNOSIS — Z5112 Encounter for antineoplastic immunotherapy: Secondary | ICD-10-CM

## 2013-12-11 DIAGNOSIS — H019 Unspecified inflammation of eyelid: Secondary | ICD-10-CM

## 2013-12-11 LAB — COMPREHENSIVE METABOLIC PANEL (CC13)
ALT: 17 U/L (ref 0–55)
AST: 22 U/L (ref 5–34)
Albumin: 2.6 g/dL — ABNORMAL LOW (ref 3.5–5.0)
Alkaline Phosphatase: 66 U/L (ref 40–150)
Anion Gap: 9 mEq/L (ref 3–11)
BILIRUBIN TOTAL: 0.85 mg/dL (ref 0.20–1.20)
BUN: 14.8 mg/dL (ref 7.0–26.0)
CO2: 23 mEq/L (ref 22–29)
CREATININE: 0.7 mg/dL (ref 0.7–1.3)
Calcium: 8.8 mg/dL (ref 8.4–10.4)
Chloride: 111 mEq/L — ABNORMAL HIGH (ref 98–109)
Glucose: 90 mg/dl (ref 70–140)
Potassium: 4.1 mEq/L (ref 3.5–5.1)
Sodium: 143 mEq/L (ref 136–145)
Total Protein: 5.3 g/dL — ABNORMAL LOW (ref 6.4–8.3)

## 2013-12-11 LAB — CBC WITH DIFFERENTIAL/PLATELET
BASO%: 0.5 % (ref 0.0–2.0)
Basophils Absolute: 0 10*3/uL (ref 0.0–0.1)
EOS%: 6.6 % (ref 0.0–7.0)
Eosinophils Absolute: 0.5 10*3/uL (ref 0.0–0.5)
HCT: 34.8 % — ABNORMAL LOW (ref 38.4–49.9)
HGB: 11.7 g/dL — ABNORMAL LOW (ref 13.0–17.1)
LYMPH#: 0.9 10*3/uL (ref 0.9–3.3)
LYMPH%: 11.5 % — ABNORMAL LOW (ref 14.0–49.0)
MCH: 31.1 pg (ref 27.2–33.4)
MCHC: 33.5 g/dL (ref 32.0–36.0)
MCV: 92.6 fL (ref 79.3–98.0)
MONO#: 1 10*3/uL — ABNORMAL HIGH (ref 0.1–0.9)
MONO%: 12.8 % (ref 0.0–14.0)
NEUT#: 5.2 10*3/uL (ref 1.5–6.5)
NEUT%: 68.6 % (ref 39.0–75.0)
Platelets: 143 10*3/uL (ref 140–400)
RBC: 3.75 10*6/uL — ABNORMAL LOW (ref 4.20–5.82)
RDW: 15.4 % — AB (ref 11.0–14.6)
WBC: 7.6 10*3/uL (ref 4.0–10.3)

## 2013-12-11 MED ORDER — ONDANSETRON HCL 8 MG PO TABS
ORAL_TABLET | ORAL | Status: AC
  Filled 2013-12-11: qty 1

## 2013-12-11 MED ORDER — BORTEZOMIB CHEMO SQ INJECTION 3.5 MG (2.5MG/ML)
1.3000 mg/m2 | Freq: Once | INTRAMUSCULAR | Status: AC
  Administered 2013-12-11: 2.25 mg via SUBCUTANEOUS
  Filled 2013-12-11: qty 2.25

## 2013-12-11 MED ORDER — ONDANSETRON HCL 8 MG PO TABS
8.0000 mg | ORAL_TABLET | Freq: Once | ORAL | Status: AC
  Administered 2013-12-11: 8 mg via ORAL

## 2013-12-11 NOTE — Telephone Encounter (Signed)
appts made per 12/11/13 POF AVS given to pt. Email to Dr. Darnell Level to advise if 2/6 appt w [facility] can be moved to 2/13 to concide w labs and txt shh

## 2013-12-11 NOTE — Telephone Encounter (Signed)
per Dr Darnell Level staff msg of 01/02 OK to move appt w Eugene Martinez from 02/06 to 02/13 Appt moved Pt will check his My Chart for lsiting of appointments shh

## 2013-12-11 NOTE — Patient Instructions (Signed)
[facility] Discharge Instructions for Patients Receiving Chemotherapy  Today you received the following chemotherapy agents: velcade  To help prevent nausea and vomiting after your treatment, we encourage you to take your nausea medication: Zofran 8 mg every 6 hrs as needed   If you develop nausea and vomiting that is not controlled by your nausea medication, call the clinic.   BELOW ARE SYMPTOMS THAT SHOULD BE REPORTED IMMEDIATELY:  *FEVER GREATER THAN 100.5 F  *CHILLS WITH OR WITHOUT FEVER  NAUSEA AND VOMITING THAT IS NOT CONTROLLED WITH YOUR NAUSEA MEDICATION  *UNUSUAL SHORTNESS OF BREATH  *UNUSUAL BRUISING OR BLEEDING  TENDERNESS IN MOUTH AND THROAT WITH OR WITHOUT PRESENCE OF ULCERS  *URINARY PROBLEMS  *BOWEL PROBLEMS  UNUSUAL RASH Items with * indicate a potential emergency and should be followed up as soon as possible.  Feel free to call the clinic you have any questions or concerns. The clinic phone number is [phone].

## 2013-12-11 NOTE — Progress Notes (Signed)
Hematology and Oncology Follow Up Visit  Eugene Martinez [id number] 1946-01-18 69 y.o. 12/11/2013 1:36 PM   Principle Diagnosis: Encounter Diagnoses  Name Primary?  . AL amyloidosis Yes  . 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 He had a significant elevation of pro BNP but normal troponin levels. Normal estimated ejection fraction both on echocardiogram and cardiac MRI.  He was started on a chemotherapy program with oral Cytoxan, subcutaneous Velcade, and oral dexamethasone. He was doing very well on the program until July when he got dehydrated, had rapid weight and strength loss. Treatment was interrupted and subsequently resumed with a modified dosing schedule giving the Velcade every 2 weeks, Decadron 40 mg every 2 weeks, and deleting the Cytoxan. He is much better since then with improved performance status.    Laboratory profile remains stable with normalization of serum free light chains. Kappa lambda light chain ratio 1.03 with lambda free light chain 0.79 mg percent on 11/13/2013. Most recent 24-hour urine done through our office on 10/02/2013 still showed significant but nonselective proteinuria 3.8 g compared with 4.9 g back in January. No monoclonal protein seen on IFE of the urine. He had a recent followup visit at [facility] with another 24-hour urine for data not available at time of this dictation. He was told that he was in remission. He has been under consideration for high-dose chemotherapy consolidation with stem cell support. This has been put on the back burner for now.  He has no new symptoms at this time. Legs tend to swell during the day and  then go down again at night when he is supine. 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 No other interim problems.    Medications: reviewed  Allergies:  Allergies  Allergen Reactions  . Other Other (See Comments)    Seasonal allergies/grass/trees/dust causes watery eyes, stuffiness, sometimes swelling.    Review of Systems: Hematology: No bleeding or bruising ENT ROS: No sore throat Breast ROS:  Respiratory ROS: No cough or dyspnea Cardiovascular ROS:   No chest pain or palpitations, no further presyncopal episodes, edema as noted above Gastrointestinal ROS:  He has had some intermittent loose bowel movements over the last few days Genito-Urinary ROS: No urinary tract symptoms Musculoskeletal ROS no muscle bone or joint pain Neurological ROS: No headache or change in vision Dermatological ROS: No rash Remaining ROS negative.  Physical Exam: Blood pressure 112/66, pulse 93, temperature 97.3 F (36.3 C), temperature source Oral, resp. rate 18, height 5\' 5"  (1.651 m), weight 154 lb 12.8 oz (70.217 kg), SpO2 97.00%. Wt Readings from Last 3 Encounters:  12/11/13 154 lb 12.8 oz (70.217 kg)  10/30/13 164 lb 4.8 oz (74.526 kg)  09/28/13 161 lb 6.4 oz (73.211 kg)     General appearance: Well-nourished Caucasian man HENNT: Pharynx no erythema, exudate, mass, or ulcer. No thyromegaly or thyroid nodules Lymph nodes: No cervical, supraclavicular, or axillary lymphadenopathy Breasts:  Lungs: Clear to auscultation, resonant to percussion throughout Heart: Regular rhythm, no murmur, no gallop, no rub, no click, no edema Abdomen: Soft, nontender, normal bowel sounds, no mass, no organomegaly Extremities: No edema, no calf tenderness, 1+  edema and decreased from previous exam Musculoskeletal: no joint deformities GU:  Vascular: Carotid pulses 2+, no bruits Neurologic: Alert, oriented, PERRLA, cranial nerves grossly normal, motor  strength 5 over 5, reflexes 1+ symmetric, upper body coordination normal, gait normal, Skin: No rash or ecchymosis  Lab Results: CBC W/Diff    Component Value Date/Time   WBC 7.6 12/11/2013 1104   WBC 19.6 Repeated and verified X2.* 02/20/2013 1157   RBC 3.75* 12/11/2013 1104   RBC 4.11* 02/20/2013 1157   RBC 4.02* 10/30/2012 0911   HGB 11.7* 12/11/2013 1104   HGB 12.6* 02/20/2013 1157   HCT 34.8* 12/11/2013 1104   HCT 37.0* 02/20/2013 1157   PLT 143 12/11/2013 1104   PLT 146.0* 02/20/2013 1157   MCV 92.6 12/11/2013 1104   MCV 90.0 02/20/2013 1157   MCH 31.1 12/11/2013 1104   MCH 30.5 01/24/2013 0500   MCHC 33.5 12/11/2013 1104   MCHC 34.2 02/20/2013 1157   RDW 15.4* 12/11/2013 1104   RDW 14.3 02/20/2013 1157   LYMPHSABS 0.9 12/11/2013 1104   LYMPHSABS 1.5 02/20/2013 1157   MONOABS 1.0* 12/11/2013 1104   MONOABS 1.5* 02/20/2013 1157   EOSABS 0.5 12/11/2013 1104   EOSABS 0.0 02/20/2013 1157   BASOSABS 0.0 12/11/2013 1104   BASOSABS 0.0 02/20/2013 1157     Chemistry      Component Value Date/Time   NA 143 12/11/2013 1105   NA 135 02/20/2013 1157   K 4.1 12/11/2013 1105   K 3.9 02/20/2013 1157   CL 100 05/22/2013 0922   CL 103 02/20/2013 1157   CO2 23 12/11/2013 1105   CO2 27 02/20/2013 1157   BUN 14.8 12/11/2013 1105   BUN 30* 02/20/2013 1157   CREATININE 0.7 12/11/2013 1105   CREATININE 1.1 02/20/2013 1157   CREATININE 0.93 12/15/2012 0832      Component Value Date/Time   CALCIUM 8.8 12/11/2013 1105   CALCIUM 8.2* 02/20/2013 1157   ALKPHOS 66 12/11/2013 1105   ALKPHOS 80 01/21/2013 0932   AST 22 12/11/2013 1105   AST 22 01/21/2013 0932   ALT 17 12/11/2013 1105   ALT 18 01/21/2013 0932   BILITOT 0.85 12/11/2013 1105   BILITOT 0.8 01/21/2013 0932     .  Impression:  #1. AL amyloidosis responding to treatment outlined above.  He still has significant nonselective proteinuria and may have sustained some irreversible damage to his kidneys. I discussed this with him and his wife today. I do think that he should at least have  stem cells harvested and collected at this time even if  he decides not to have high-dose chemotherapy consolidation. The chronic use of Velcade will decrease his stem cell pool and may limit what we can collect in the future.   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     CC: Patient Care Team: Tracie Harrier, MD as PCP - General (Internal Medicine) Annia Belt, MD as Consulting Physician (Oncology) Darlin Coco, MD as Consulting Physician (Cardiology) Jeanann Lewandowsky as Consulting Physician (Internal Medicine)   Annia Belt, MD 1/2/20151:36 PM

## 2013-12-14 LAB — KAPPA/LAMBDA LIGHT CHAINS
KAPPA LAMBDA RATIO: 1.13 (ref 0.26–1.65)
Kappa free light chain: 1.62 mg/dL (ref 0.33–1.94)
Lambda Free Lght Chn: 1.44 mg/dL (ref 0.57–2.63)

## 2013-12-25 ENCOUNTER — Ambulatory Visit: Payer: BC Managed Care – PPO | Admitting: Nutrition

## 2013-12-25 ENCOUNTER — Ambulatory Visit (HOSPITAL_BASED_OUTPATIENT_CLINIC_OR_DEPARTMENT_OTHER): Payer: BC Managed Care – PPO

## 2013-12-25 ENCOUNTER — Other Ambulatory Visit (HOSPITAL_BASED_OUTPATIENT_CLINIC_OR_DEPARTMENT_OTHER): Payer: BC Managed Care – PPO

## 2013-12-25 VITALS — BP 120/72 | HR 105 | Temp 97.6°F | Resp 16

## 2013-12-25 DIAGNOSIS — Z5112 Encounter for antineoplastic immunotherapy: Secondary | ICD-10-CM

## 2013-12-25 DIAGNOSIS — E8581 Light chain (AL) amyloidosis: Secondary | ICD-10-CM

## 2013-12-25 DIAGNOSIS — R803 Bence Jones proteinuria: Secondary | ICD-10-CM

## 2013-12-25 DIAGNOSIS — E8589 Other amyloidosis: Secondary | ICD-10-CM

## 2013-12-25 LAB — CBC WITH DIFFERENTIAL/PLATELET
BASO%: 0.4 % (ref 0.0–2.0)
Basophils Absolute: 0 10*3/uL (ref 0.0–0.1)
EOS ABS: 0.3 10*3/uL (ref 0.0–0.5)
EOS%: 3.1 % (ref 0.0–7.0)
HEMATOCRIT: 36.2 % — AB (ref 38.4–49.9)
HEMOGLOBIN: 12.1 g/dL — AB (ref 13.0–17.1)
LYMPH%: 4.2 % — ABNORMAL LOW (ref 14.0–49.0)
MCH: 30.8 pg (ref 27.2–33.4)
MCHC: 33.3 g/dL (ref 32.0–36.0)
MCV: 92.5 fL (ref 79.3–98.0)
MONO#: 0.5 10*3/uL (ref 0.1–0.9)
MONO%: 5.1 % (ref 0.0–14.0)
NEUT%: 87.2 % — ABNORMAL HIGH (ref 39.0–75.0)
NEUTROS ABS: 8.3 10*3/uL — AB (ref 1.5–6.5)
Platelets: 142 10*3/uL (ref 140–400)
RBC: 3.91 10*6/uL — ABNORMAL LOW (ref 4.20–5.82)
RDW: 15.6 % — AB (ref 11.0–14.6)
WBC: 9.5 10*3/uL (ref 4.0–10.3)
lymph#: 0.4 10*3/uL — ABNORMAL LOW (ref 0.9–3.3)

## 2013-12-25 MED ORDER — BORTEZOMIB CHEMO SQ INJECTION 3.5 MG (2.5MG/ML)
1.3000 mg/m2 | Freq: Once | INTRAMUSCULAR | Status: AC
  Administered 2013-12-25: 2.25 mg via SUBCUTANEOUS
  Filled 2013-12-25: qty 2.25

## 2013-12-25 MED ORDER — ONDANSETRON HCL 8 MG PO TABS
ORAL_TABLET | ORAL | Status: AC
Start: 2013-12-25 — End: 2013-12-25
  Filled 2013-12-25: qty 1

## 2013-12-25 MED ORDER — ONDANSETRON HCL 8 MG PO TABS
8.0000 mg | ORAL_TABLET | Freq: Once | ORAL | Status: AC
  Administered 2013-12-25: 8 mg via ORAL

## 2013-12-25 MED ORDER — DEXAMETHASONE 4 MG PO TABS
40.0000 mg | ORAL_TABLET | ORAL | Status: DC
Start: 2013-12-25 — End: 2014-01-22

## 2013-12-25 NOTE — Patient Instructions (Signed)
[facility] Discharge Instructions for Patients Receiving Chemotherapy  Today you received the following chemotherapy agents Velcade.  To help prevent nausea and vomiting after your treatment, we encourage you to take your nausea medication as prescribed.     If you develop nausea and vomiting that is not controlled by your nausea medication, call the clinic.   BELOW ARE SYMPTOMS THAT SHOULD BE REPORTED IMMEDIATELY:  *FEVER GREATER THAN 100.5 F  *CHILLS WITH OR WITHOUT FEVER  NAUSEA AND VOMITING THAT IS NOT CONTROLLED WITH YOUR NAUSEA MEDICATION  *UNUSUAL SHORTNESS OF BREATH  *UNUSUAL BRUISING OR BLEEDING  TENDERNESS IN MOUTH AND THROAT WITH OR WITHOUT PRESENCE OF ULCERS  *URINARY PROBLEMS  *BOWEL PROBLEMS  UNUSUAL RASH Items with * indicate a potential emergency and should be followed up as soon as possible.  Feel free to call the clinic should you have any questions or concerns. The clinic phone number is [phone].  It was my pleasure to take care of you today!  Leeanne Rio, RN

## 2013-12-25 NOTE — Progress Notes (Signed)
Brief followup completed with patient in chemotherapy.  Weight was documented as 154.8 pounds on January 2.  Patient reports a good appetite.  He is eating at least 3 meals a day and drinking boost 2 times a day.  Patient feels well.  No nutrition concerns.  Nutrition diagnosis: Unintended weight loss resolved.  Recommended patient continue oral nutrition supplements for weight stabilization.  Patient educated to contact me if he has any nutrition concerns.  Patient expresses appreciation.

## 2013-12-29 LAB — UIFE/LIGHT CHAINS/TP QN, 24-HR UR
Albumin, U: DETECTED
Alpha 1, Urine: DETECTED — AB
Alpha 2, Urine: DETECTED — AB
Beta, Urine: DETECTED — AB
FREE KAPPA LT CHAINS, UR: 0.63 mg/dL (ref 0.14–2.42)
FREE KAPPA/LAMBDA RATIO: 2.52 ratio (ref 2.04–10.37)
FREE LAMBDA EXCRETION/DAY: 8 mg/d
Free Lambda Lt Chains,Ur: 0.25 mg/dL (ref 0.02–0.67)
Free Lt Chn Excr Rate: 20.16 mg/d
Gamma Globulin, Urine: DETECTED — AB
TIME-UPE24: 24 h
TOTAL PROTEIN, URINE-UPE24: 43.4 mg/dL
Total Protein, Urine-Ur/day: 1389 mg/d — ABNORMAL HIGH (ref 10–140)
Volume, Urine: 3200 mL

## 2013-12-30 ENCOUNTER — Telehealth: Payer: Self-pay | Admitting: *Deleted

## 2013-12-30 NOTE — Telephone Encounter (Signed)
Message copied by Jesse Fall on Wed Dec 30, 2013  9:39 AM ------      Message from: Annia Belt      Created: Tue Dec 29, 2013 10:57 AM       Call pt: significant decrease in urine protein down from 3.8 to 1.4 grams      faxx cc Dr Greer Pickerel BMT ------

## 2013-12-30 NOTE — Telephone Encounter (Signed)
Reached pt today & message regarding decrease in urine protein given to pt per Dr Beryle Beams & lab routed to Dr. Megan Mans BMT.

## 2014-01-08 ENCOUNTER — Ambulatory Visit (HOSPITAL_BASED_OUTPATIENT_CLINIC_OR_DEPARTMENT_OTHER): Payer: BC Managed Care – PPO

## 2014-01-08 ENCOUNTER — Other Ambulatory Visit (HOSPITAL_BASED_OUTPATIENT_CLINIC_OR_DEPARTMENT_OTHER): Payer: BC Managed Care – PPO

## 2014-01-08 VITALS — BP 118/64 | HR 96 | Temp 98.1°F | Resp 18

## 2014-01-08 DIAGNOSIS — E8589 Other amyloidosis: Secondary | ICD-10-CM

## 2014-01-08 DIAGNOSIS — E8581 Light chain (AL) amyloidosis: Secondary | ICD-10-CM

## 2014-01-08 DIAGNOSIS — R803 Bence Jones proteinuria: Secondary | ICD-10-CM

## 2014-01-08 DIAGNOSIS — Z5112 Encounter for antineoplastic immunotherapy: Secondary | ICD-10-CM

## 2014-01-08 LAB — CBC WITH DIFFERENTIAL/PLATELET
BASO%: 0.4 % (ref 0.0–2.0)
Basophils Absolute: 0 10*3/uL (ref 0.0–0.1)
EOS ABS: 0.1 10*3/uL (ref 0.0–0.5)
EOS%: 1.4 % (ref 0.0–7.0)
HEMATOCRIT: 36.5 % — AB (ref 38.4–49.9)
HGB: 12.1 g/dL — ABNORMAL LOW (ref 13.0–17.1)
LYMPH%: 5.4 % — ABNORMAL LOW (ref 14.0–49.0)
MCH: 30.4 pg (ref 27.2–33.4)
MCHC: 33.1 g/dL (ref 32.0–36.0)
MCV: 92 fL (ref 79.3–98.0)
MONO#: 0.2 10*3/uL (ref 0.1–0.9)
MONO%: 2.3 % (ref 0.0–14.0)
NEUT%: 90.5 % — AB (ref 39.0–75.0)
NEUTROS ABS: 6.6 10*3/uL — AB (ref 1.5–6.5)
Platelets: 153 10*3/uL (ref 140–400)
RBC: 3.97 10*6/uL — ABNORMAL LOW (ref 4.20–5.82)
RDW: 15.1 % — ABNORMAL HIGH (ref 11.0–14.6)
WBC: 7.3 10*3/uL (ref 4.0–10.3)
lymph#: 0.4 10*3/uL — ABNORMAL LOW (ref 0.9–3.3)

## 2014-01-08 LAB — COMPREHENSIVE METABOLIC PANEL (CC13)
ALT: 18 U/L (ref 0–55)
AST: 21 U/L (ref 5–34)
Albumin: 2.7 g/dL — ABNORMAL LOW (ref 3.5–5.0)
Alkaline Phosphatase: 69 U/L (ref 40–150)
Anion Gap: 8 mEq/L (ref 3–11)
BUN: 16 mg/dL (ref 7.0–26.0)
CHLORIDE: 105 meq/L (ref 98–109)
CO2: 25 mEq/L (ref 22–29)
Calcium: 9 mg/dL (ref 8.4–10.4)
Creatinine: 0.7 mg/dL (ref 0.7–1.3)
GLUCOSE: 106 mg/dL (ref 70–140)
POTASSIUM: 4.3 meq/L (ref 3.5–5.1)
Sodium: 138 mEq/L (ref 136–145)
Total Bilirubin: 0.36 mg/dL (ref 0.20–1.20)
Total Protein: 5.4 g/dL — ABNORMAL LOW (ref 6.4–8.3)

## 2014-01-08 MED ORDER — ONDANSETRON HCL 8 MG PO TABS
ORAL_TABLET | ORAL | Status: AC
  Filled 2014-01-08: qty 1

## 2014-01-08 MED ORDER — DEXAMETHASONE 4 MG PO TABS: 40.0000 mg | ORAL_TABLET | ORAL | Status: DC

## 2014-01-08 MED ORDER — BORTEZOMIB CHEMO SQ INJECTION 3.5 MG (2.5MG/ML)
1.3000 mg/m2 | Freq: Once | INTRAMUSCULAR | Status: AC
  Administered 2014-01-08: 2.25 mg via SUBCUTANEOUS
  Filled 2014-01-08: qty 2.25

## 2014-01-08 MED ORDER — ONDANSETRON HCL 8 MG PO TABS
8.0000 mg | ORAL_TABLET | Freq: Once | ORAL | Status: AC
  Administered 2014-01-08: 8 mg via ORAL

## 2014-01-08 NOTE — Patient Instructions (Signed)
[facility] Discharge Instructions for Patients Receiving Chemotherapy  Today you received the following chemotherapy agent: Velcade   To help prevent nausea and vomiting after your treatment, we encourage you to take your nausea medication as prescribed.    If you develop nausea and vomiting that is not controlled by your nausea medication, call the clinic.   BELOW ARE SYMPTOMS THAT SHOULD BE REPORTED IMMEDIATELY:  *FEVER GREATER THAN 100.5 F  *CHILLS WITH OR WITHOUT FEVER  NAUSEA AND VOMITING THAT IS NOT CONTROLLED WITH YOUR NAUSEA MEDICATION  *UNUSUAL SHORTNESS OF BREATH  *UNUSUAL BRUISING OR BLEEDING  TENDERNESS IN MOUTH AND THROAT WITH OR WITHOUT PRESENCE OF ULCERS  *URINARY PROBLEMS  *BOWEL PROBLEMS  UNUSUAL RASH Items with * indicate a potential emergency and should be followed up as soon as possible.  Feel free to call the clinic you have any questions or concerns. The clinic phone number is [phone].    

## 2014-01-08 NOTE — Progress Notes (Signed)
Patient reports taking his dexamethasone at home as prescribed.

## 2014-01-11 LAB — KAPPA/LAMBDA LIGHT CHAINS
KAPPA FREE LGHT CHN: 1.45 mg/dL (ref 0.33–1.94)
Kappa:Lambda Ratio: 0.13 — ABNORMAL LOW (ref 0.26–1.65)
Lambda Free Lght Chn: 10.8 mg/dL — ABNORMAL HIGH (ref 0.57–2.63)

## 2014-01-13 ENCOUNTER — Other Ambulatory Visit: Payer: Self-pay | Admitting: Oncology

## 2014-01-13 DIAGNOSIS — R809 Proteinuria, unspecified: Secondary | ICD-10-CM

## 2014-01-13 DIAGNOSIS — E8581 Light chain (AL) amyloidosis: Secondary | ICD-10-CM

## 2014-01-15 ENCOUNTER — Ambulatory Visit: Payer: BC Managed Care – PPO | Admitting: Nurse Practitioner

## 2014-01-15 ENCOUNTER — Encounter: Payer: Self-pay | Admitting: *Deleted

## 2014-01-22 ENCOUNTER — Telehealth: Payer: Self-pay | Admitting: Oncology

## 2014-01-22 ENCOUNTER — Ambulatory Visit (HOSPITAL_BASED_OUTPATIENT_CLINIC_OR_DEPARTMENT_OTHER): Payer: BC Managed Care – PPO | Admitting: Nurse Practitioner

## 2014-01-22 ENCOUNTER — Ambulatory Visit (HOSPITAL_BASED_OUTPATIENT_CLINIC_OR_DEPARTMENT_OTHER): Payer: BC Managed Care – PPO

## 2014-01-22 ENCOUNTER — Other Ambulatory Visit (HOSPITAL_BASED_OUTPATIENT_CLINIC_OR_DEPARTMENT_OTHER): Payer: BC Managed Care – PPO

## 2014-01-22 VITALS — BP 124/67 | HR 102 | Temp 96.9°F | Resp 18 | Ht 65.0 in | Wt 162.7 lb

## 2014-01-22 DIAGNOSIS — R809 Proteinuria, unspecified: Secondary | ICD-10-CM

## 2014-01-22 DIAGNOSIS — E8589 Other amyloidosis: Secondary | ICD-10-CM

## 2014-01-22 DIAGNOSIS — I43 Cardiomyopathy in diseases classified elsewhere: Secondary | ICD-10-CM

## 2014-01-22 DIAGNOSIS — E8581 Light chain (AL) amyloidosis: Secondary | ICD-10-CM

## 2014-01-22 DIAGNOSIS — R803 Bence Jones proteinuria: Secondary | ICD-10-CM

## 2014-01-22 DIAGNOSIS — Z5112 Encounter for antineoplastic immunotherapy: Secondary | ICD-10-CM

## 2014-01-22 DIAGNOSIS — E889 Metabolic disorder, unspecified: Secondary | ICD-10-CM

## 2014-01-22 DIAGNOSIS — E639 Nutritional deficiency, unspecified: Secondary | ICD-10-CM

## 2014-01-22 LAB — COMPREHENSIVE METABOLIC PANEL (CC13)
ALBUMIN: 2.9 g/dL — AB (ref 3.5–5.0)
ALT: 19 U/L (ref 0–55)
ALT: 19 U/L (ref 0–55)
ANION GAP: 9 meq/L (ref 3–11)
AST: 21 U/L (ref 5–34)
AST: 22 U/L (ref 5–34)
Albumin: 3 g/dL — ABNORMAL LOW (ref 3.5–5.0)
Alkaline Phosphatase: 71 U/L (ref 40–150)
Alkaline Phosphatase: 71 U/L (ref 40–150)
Anion Gap: 8 mEq/L (ref 3–11)
BUN: 15.3 mg/dL (ref 7.0–26.0)
BUN: 15.4 mg/dL (ref 7.0–26.0)
CALCIUM: 9 mg/dL (ref 8.4–10.4)
CHLORIDE: 108 meq/L (ref 98–109)
CO2: 23 mEq/L (ref 22–29)
CO2: 23 mEq/L (ref 22–29)
CREATININE: 0.8 mg/dL (ref 0.7–1.3)
Calcium: 8.7 mg/dL (ref 8.4–10.4)
Chloride: 108 mEq/L (ref 98–109)
Creatinine: 0.8 mg/dL (ref 0.7–1.3)
Glucose: 110 mg/dl (ref 70–140)
Glucose: 111 mg/dl (ref 70–140)
POTASSIUM: 4.3 meq/L (ref 3.5–5.1)
Potassium: 4.3 mEq/L (ref 3.5–5.1)
Sodium: 139 mEq/L (ref 136–145)
Sodium: 140 mEq/L (ref 136–145)
TOTAL PROTEIN: 5.2 g/dL — AB (ref 6.4–8.3)
Total Bilirubin: 0.41 mg/dL (ref 0.20–1.20)
Total Bilirubin: 0.42 mg/dL (ref 0.20–1.20)
Total Protein: 5.6 g/dL — ABNORMAL LOW (ref 6.4–8.3)

## 2014-01-22 LAB — CBC WITH DIFFERENTIAL/PLATELET
BASO%: 0.2 % (ref 0.0–2.0)
Basophils Absolute: 0 10*3/uL (ref 0.0–0.1)
EOS%: 1.7 % (ref 0.0–7.0)
Eosinophils Absolute: 0.1 10*3/uL (ref 0.0–0.5)
HEMATOCRIT: 35.9 % — AB (ref 38.4–49.9)
HGB: 12.1 g/dL — ABNORMAL LOW (ref 13.0–17.1)
LYMPH%: 7.3 % — AB (ref 14.0–49.0)
MCH: 30.2 pg (ref 27.2–33.4)
MCHC: 33.7 g/dL (ref 32.0–36.0)
MCV: 89.5 fL (ref 79.3–98.0)
MONO#: 0.2 10*3/uL (ref 0.1–0.9)
MONO%: 2.8 % (ref 0.0–14.0)
NEUT#: 5.1 10*3/uL (ref 1.5–6.5)
NEUT%: 88 % — ABNORMAL HIGH (ref 39.0–75.0)
PLATELETS: 120 10*3/uL — AB (ref 140–400)
RBC: 4.01 10*6/uL — ABNORMAL LOW (ref 4.20–5.82)
RDW: 14.9 % — ABNORMAL HIGH (ref 11.0–14.6)
WBC: 5.8 10*3/uL (ref 4.0–10.3)
lymph#: 0.4 10*3/uL — ABNORMAL LOW (ref 0.9–3.3)

## 2014-01-22 MED ORDER — BORTEZOMIB CHEMO SQ INJECTION 3.5 MG (2.5MG/ML)
1.3000 mg/m2 | Freq: Once | INTRAMUSCULAR | Status: AC
  Administered 2014-01-22: 2.25 mg via SUBCUTANEOUS
  Filled 2014-01-22: qty 2.25

## 2014-01-22 MED ORDER — ONDANSETRON HCL 8 MG PO TABS
8.0000 mg | ORAL_TABLET | Freq: Once | ORAL | Status: AC
  Administered 2014-01-22: 8 mg via ORAL

## 2014-01-22 MED ORDER — ONDANSETRON HCL 8 MG PO TABS
ORAL_TABLET | ORAL | Status: AC
  Filled 2014-01-22: qty 1

## 2014-01-22 NOTE — Patient Instructions (Signed)
[facility] Discharge Instructions for Patients Receiving Chemotherapy  Today you received the following chemotherapy agents: Velcade.  To help prevent nausea and vomiting after your treatment, we encourage you to take your nausea medication as prescribed.   If you develop nausea and vomiting that is not controlled by your nausea medication, call the clinic.   BELOW ARE SYMPTOMS THAT SHOULD BE REPORTED IMMEDIATELY:  *FEVER GREATER THAN 100.5 F  *CHILLS WITH OR WITHOUT FEVER  NAUSEA AND VOMITING THAT IS NOT CONTROLLED WITH YOUR NAUSEA MEDICATION  *UNUSUAL SHORTNESS OF BREATH  *UNUSUAL BRUISING OR BLEEDING  TENDERNESS IN MOUTH AND THROAT WITH OR WITHOUT PRESENCE OF ULCERS  *URINARY PROBLEMS  *BOWEL PROBLEMS  UNUSUAL RASH Items with * indicate a potential emergency and should be followed up as soon as possible.  Feel free to call the clinic you have any questions or concerns. The clinic phone number is [phone].    

## 2014-01-22 NOTE — Telephone Encounter (Signed)
gv pt appt schedule for feb and march.  °

## 2014-01-22 NOTE — Progress Notes (Signed)
OFFICE PROGRESS NOTE  Interval history:  Mr. Wrightsman is a 68 year old man with primary AL amyloidosis diagnosed December 2013 when he presented with a near syncopal episode. He was found to have an abnormal echocardiogram pattern suggestive of amyloidosis and nephrotic range proteinuria which was nonselective but contained monoclonal lambda light chains. He was started on chemotherapy with oral Cytoxan, subcutaneous Velcade and oral dexamethasone. In July 2014 he became dehydrated, had rapid weight and strength loss. Treatment was interrupted and subsequently resumed with deletion of the Cytoxan, modification of the Velcade to every 2 weeks and Decadron every 2 weeks. His performance status improved. Most recent 24-hour urine from 12/25/2013 showed significant decrease in urine protein from 3.8 to 1.4 g. Most recent serum light chain analysis from 01/08/2014 showed an increase in the lambda free light chains from a previous value of 1.44 on 12/11/2013 to 10.8.  He is seen today for scheduled followup. He feels he is tolerating the Velcade well. He denies nausea/vomiting. No mouth sores. No diarrhea or constipation. Leg swelling continues to be improved. He takes a diuretic as needed. He denies any numbness or tingling in his hands or feet. He has a good appetite. He reports a 1/2-2 week history of left arm/shoulder pain. He reports recent evaluation by orthopedics including x-rays which showed "inflammation". He is deciding between a steroid injection or a trial of ibuprofen.   Objective: Filed Vitals:   01/22/14 1011  BP: 124/67  Pulse: 102  Temp: 96.9 F (36.1 C)  Resp: 18   Oropharynx is without thrush or ulceration. No palpable cervical or supraclavicular lymph nodes. Lungs are clear. No wheezes or rales. Regular cardiac rhythm. Abdomen soft and nontender. No organomegaly. Trace to 1+ lower leg edema bilaterally. Vibratory sense mildly decreased over the fingertips per tuning fork exam. No  skin rash.   Lab Results: Lab Results  Component Value Date   WBC 5.8 01/22/2014   HGB 12.1* 01/22/2014   HCT 35.9* 01/22/2014   MCV 89.5 01/22/2014   PLT 120* 01/22/2014   NEUTROABS 5.1 01/22/2014    Chemistry:    Chemistry      Component Value Date/Time   NA 139 01/22/2014 0951   NA 135 02/20/2013 1157   K 4.3 01/22/2014 0951   K 3.9 02/20/2013 1157   CL 100 05/22/2013 0922   CL 103 02/20/2013 1157   CO2 23 01/22/2014 0951   CO2 27 02/20/2013 1157   BUN 15.4 01/22/2014 0951   BUN 30* 02/20/2013 1157   CREATININE 0.8 01/22/2014 0951   CREATININE 1.1 02/20/2013 1157   CREATININE 0.93 12/15/2012 0832      Component Value Date/Time   CALCIUM 9.0 01/22/2014 0951   CALCIUM 8.2* 02/20/2013 1157   ALKPHOS 71 01/22/2014 0951   ALKPHOS 80 01/21/2013 0932   AST 21 01/22/2014 0951   AST 22 01/21/2013 0932   ALT 19 01/22/2014 0951   ALT 18 01/21/2013 0932   BILITOT 0.42 01/22/2014 0951   BILITOT 0.8 01/21/2013 0932       Studies/Results: No results found.  Medications: I have reviewed the patient's current medications.  Assessment/Plan: 1. AL amyloidosis currently on treatment with every 2 week of Velcade/dexamethasone. 2. 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 3. 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  4. Chronic irritation of the eyelids. Improved with sulfa eyedrops. 5. Left upper arm/shoulder pain currently undergoing evaluation by orthopedics.  Dispositon-he appears well. Plan to continue every 2 week Velcade/dexamethasone. We will followup on the serum light  chain analysis from today. He has a followup visit with Dr. Beryle Beams on 02/23/2014.  He will contact the office in the interim with any problems.  Plan reviewed with Dr. Beryle Beams.   Ned Card ANP/GNP-BC

## 2014-01-25 LAB — KAPPA/LAMBDA LIGHT CHAINS
Kappa free light chain: 0.8 mg/dL (ref 0.33–1.94)
Kappa:Lambda Ratio: 0.65 (ref 0.26–1.65)
Lambda Free Lght Chn: 1.23 mg/dL (ref 0.57–2.63)

## 2014-01-26 ENCOUNTER — Other Ambulatory Visit: Payer: Self-pay | Admitting: Oncology

## 2014-01-28 ENCOUNTER — Other Ambulatory Visit: Payer: Self-pay | Admitting: Oncology

## 2014-02-05 ENCOUNTER — Other Ambulatory Visit (HOSPITAL_BASED_OUTPATIENT_CLINIC_OR_DEPARTMENT_OTHER): Payer: BC Managed Care – PPO

## 2014-02-05 ENCOUNTER — Other Ambulatory Visit: Payer: Self-pay | Admitting: *Deleted

## 2014-02-05 ENCOUNTER — Ambulatory Visit (HOSPITAL_BASED_OUTPATIENT_CLINIC_OR_DEPARTMENT_OTHER): Payer: BC Managed Care – PPO

## 2014-02-05 VITALS — BP 129/71 | HR 102 | Temp 97.5°F | Resp 20

## 2014-02-05 DIAGNOSIS — R803 Bence Jones proteinuria: Secondary | ICD-10-CM

## 2014-02-05 DIAGNOSIS — E8589 Other amyloidosis: Secondary | ICD-10-CM

## 2014-02-05 DIAGNOSIS — R809 Proteinuria, unspecified: Secondary | ICD-10-CM

## 2014-02-05 DIAGNOSIS — E8581 Light chain (AL) amyloidosis: Secondary | ICD-10-CM

## 2014-02-05 DIAGNOSIS — Z5112 Encounter for antineoplastic immunotherapy: Secondary | ICD-10-CM

## 2014-02-05 LAB — CBC WITH DIFFERENTIAL/PLATELET
BASO%: 0.5 % (ref 0.0–2.0)
BASOS ABS: 0 10*3/uL (ref 0.0–0.1)
EOS ABS: 0.3 10*3/uL (ref 0.0–0.5)
EOS%: 4.4 % (ref 0.0–7.0)
HCT: 34.5 % — ABNORMAL LOW (ref 38.4–49.9)
HEMOGLOBIN: 11.4 g/dL — AB (ref 13.0–17.1)
LYMPH%: 8.9 % — AB (ref 14.0–49.0)
MCH: 30.2 pg (ref 27.2–33.4)
MCHC: 32.9 g/dL (ref 32.0–36.0)
MCV: 91.9 fL (ref 79.3–98.0)
MONO#: 0.5 10*3/uL (ref 0.1–0.9)
MONO%: 7.7 % (ref 0.0–14.0)
NEUT%: 78.5 % — ABNORMAL HIGH (ref 39.0–75.0)
NEUTROS ABS: 5.3 10*3/uL (ref 1.5–6.5)
PLATELETS: 119 10*3/uL — AB (ref 140–400)
RBC: 3.76 10*6/uL — ABNORMAL LOW (ref 4.20–5.82)
RDW: 15.4 % — AB (ref 11.0–14.6)
WBC: 6.8 10*3/uL (ref 4.0–10.3)
lymph#: 0.6 10*3/uL — ABNORMAL LOW (ref 0.9–3.3)

## 2014-02-05 MED ORDER — ONDANSETRON HCL 8 MG PO TABS
ORAL_TABLET | ORAL | Status: AC
  Filled 2014-02-05: qty 1

## 2014-02-05 MED ORDER — BORTEZOMIB CHEMO SQ INJECTION 3.5 MG (2.5MG/ML)
1.3000 mg/m2 | Freq: Once | INTRAMUSCULAR | Status: AC
  Administered 2014-02-05: 2.25 mg via SUBCUTANEOUS
  Filled 2014-02-05: qty 2.25

## 2014-02-05 MED ORDER — ONDANSETRON HCL 8 MG PO TABS
8.0000 mg | ORAL_TABLET | Freq: Once | ORAL | Status: AC
Start: 2014-02-05 — End: 2014-02-05
  Administered 2014-02-05: 8 mg via ORAL

## 2014-02-05 NOTE — Patient Instructions (Signed)
[facility] Discharge Instructions for Patients Receiving Chemotherapy  Today you received the following chemotherapy agents velcade   To help prevent nausea and vomiting after your treatment, we encourage you to take your nausea medication as directed  If you develop nausea and vomiting that is not controlled by your nausea medication, call the clinic.   BELOW ARE SYMPTOMS THAT SHOULD BE REPORTED IMMEDIATELY:  *FEVER GREATER THAN 100.5 F  *CHILLS WITH OR WITHOUT FEVER  NAUSEA AND VOMITING THAT IS NOT CONTROLLED WITH YOUR NAUSEA MEDICATION  *UNUSUAL SHORTNESS OF BREATH  *UNUSUAL BRUISING OR BLEEDING  TENDERNESS IN MOUTH AND THROAT WITH OR WITHOUT PRESENCE OF ULCERS  *URINARY PROBLEMS  *BOWEL PROBLEMS  UNUSUAL RASH Items with * indicate a potential emergency and should be followed up as soon as possible.  Feel free to call the clinic you have any questions or concerns. The clinic phone number is [phone].  

## 2014-02-06 ENCOUNTER — Encounter: Payer: Self-pay | Admitting: Oncology

## 2014-02-18 ENCOUNTER — Other Ambulatory Visit: Payer: Self-pay | Admitting: Oncology

## 2014-02-19 ENCOUNTER — Other Ambulatory Visit (HOSPITAL_BASED_OUTPATIENT_CLINIC_OR_DEPARTMENT_OTHER): Payer: BC Managed Care – PPO

## 2014-02-19 ENCOUNTER — Ambulatory Visit (HOSPITAL_BASED_OUTPATIENT_CLINIC_OR_DEPARTMENT_OTHER): Payer: BC Managed Care – PPO

## 2014-02-19 VITALS — BP 115/64 | HR 90 | Temp 97.1°F | Resp 18

## 2014-02-19 DIAGNOSIS — E8581 Light chain (AL) amyloidosis: Secondary | ICD-10-CM

## 2014-02-19 DIAGNOSIS — E8589 Other amyloidosis: Secondary | ICD-10-CM

## 2014-02-19 DIAGNOSIS — Z5111 Encounter for antineoplastic chemotherapy: Secondary | ICD-10-CM

## 2014-02-19 DIAGNOSIS — R809 Proteinuria, unspecified: Secondary | ICD-10-CM

## 2014-02-19 DIAGNOSIS — R803 Bence Jones proteinuria: Secondary | ICD-10-CM

## 2014-02-19 LAB — CBC WITH DIFFERENTIAL/PLATELET
BASO%: 0.4 % (ref 0.0–2.0)
BASOS ABS: 0 10*3/uL (ref 0.0–0.1)
EOS%: 1 % (ref 0.0–7.0)
Eosinophils Absolute: 0.1 10*3/uL (ref 0.0–0.5)
HEMATOCRIT: 37.1 % — AB (ref 38.4–49.9)
HGB: 12.2 g/dL — ABNORMAL LOW (ref 13.0–17.1)
LYMPH#: 0.4 10*3/uL — AB (ref 0.9–3.3)
LYMPH%: 4.7 % — ABNORMAL LOW (ref 14.0–49.0)
MCH: 30 pg (ref 27.2–33.4)
MCHC: 32.8 g/dL (ref 32.0–36.0)
MCV: 91.4 fL (ref 79.3–98.0)
MONO#: 0.3 10*3/uL (ref 0.1–0.9)
MONO%: 3.6 % (ref 0.0–14.0)
NEUT#: 7.4 10*3/uL — ABNORMAL HIGH (ref 1.5–6.5)
NEUT%: 90.3 % — AB (ref 39.0–75.0)
Platelets: 127 10*3/uL — ABNORMAL LOW (ref 140–400)
RBC: 4.07 10*6/uL — ABNORMAL LOW (ref 4.20–5.82)
RDW: 15.5 % — ABNORMAL HIGH (ref 11.0–14.6)
WBC: 8.2 10*3/uL (ref 4.0–10.3)

## 2014-02-19 LAB — COMPREHENSIVE METABOLIC PANEL (CC13)
ALT: 17 U/L (ref 0–55)
ANION GAP: 11 meq/L (ref 3–11)
AST: 21 U/L (ref 5–34)
Albumin: 3 g/dL — ABNORMAL LOW (ref 3.5–5.0)
Alkaline Phosphatase: 69 U/L (ref 40–150)
BUN: 21 mg/dL (ref 7.0–26.0)
CO2: 23 meq/L (ref 22–29)
CREATININE: 0.9 mg/dL (ref 0.7–1.3)
Calcium: 9.1 mg/dL (ref 8.4–10.4)
Chloride: 106 mEq/L (ref 98–109)
Glucose: 116 mg/dl (ref 70–140)
POTASSIUM: 4.2 meq/L (ref 3.5–5.1)
Sodium: 141 mEq/L (ref 136–145)
Total Bilirubin: 0.58 mg/dL (ref 0.20–1.20)
Total Protein: 5.7 g/dL — ABNORMAL LOW (ref 6.4–8.3)

## 2014-02-19 MED ORDER — ONDANSETRON HCL 8 MG PO TABS
ORAL_TABLET | ORAL | Status: AC
  Filled 2014-02-19: qty 1

## 2014-02-19 MED ORDER — BORTEZOMIB CHEMO SQ INJECTION 3.5 MG (2.5MG/ML)
1.3000 mg/m2 | Freq: Once | INTRAMUSCULAR | Status: AC
  Administered 2014-02-19: 2.25 mg via SUBCUTANEOUS
  Filled 2014-02-19: qty 2.25

## 2014-02-19 MED ORDER — ONDANSETRON HCL 8 MG PO TABS
8.0000 mg | ORAL_TABLET | Freq: Once | ORAL | Status: AC
  Administered 2014-02-19: 8 mg via ORAL

## 2014-02-19 NOTE — Progress Notes (Signed)
Pt stated he took Decadron at home for the am dose, and will take pm dose in the afternoon.

## 2014-02-19 NOTE — Patient Instructions (Signed)
[facility] Discharge Instructions for Patients Receiving Chemotherapy  Today you received the following chemotherapy agents: Velcade.  To help prevent nausea and vomiting after your treatment, we encourage you to take your nausea medication, Zofran. Take one every six hours as needed.   If you develop nausea and vomiting that is not controlled by your nausea medication, call the clinic.   BELOW ARE SYMPTOMS THAT SHOULD BE REPORTED IMMEDIATELY:  *FEVER GREATER THAN 100.5 F  *CHILLS WITH OR WITHOUT FEVER  NAUSEA AND VOMITING THAT IS NOT CONTROLLED WITH YOUR NAUSEA MEDICATION  *UNUSUAL SHORTNESS OF BREATH  *UNUSUAL BRUISING OR BLEEDING  TENDERNESS IN MOUTH AND THROAT WITH OR WITHOUT PRESENCE OF ULCERS  *URINARY PROBLEMS  *BOWEL PROBLEMS  UNUSUAL RASH Items with * indicate a potential emergency and should be followed up as soon as possible.  Feel free to call the clinic should you have any questions or concerns. The clinic phone number is [phone].

## 2014-02-22 LAB — KAPPA/LAMBDA LIGHT CHAINS
KAPPA LAMBDA RATIO: 1.48 (ref 0.26–1.65)
Kappa free light chain: 1.41 mg/dL (ref 0.33–1.94)
LAMBDA FREE LGHT CHN: 0.95 mg/dL (ref 0.57–2.63)

## 2014-02-23 ENCOUNTER — Ambulatory Visit (HOSPITAL_BASED_OUTPATIENT_CLINIC_OR_DEPARTMENT_OTHER): Payer: BC Managed Care – PPO | Admitting: Oncology

## 2014-02-23 ENCOUNTER — Telehealth: Payer: Self-pay | Admitting: Oncology

## 2014-02-23 VITALS — BP 130/70 | HR 97 | Temp 97.4°F | Resp 18 | Ht 65.0 in | Wt 162.9 lb

## 2014-02-23 DIAGNOSIS — E8589 Other amyloidosis: Secondary | ICD-10-CM

## 2014-02-23 DIAGNOSIS — R803 Bence Jones proteinuria: Secondary | ICD-10-CM

## 2014-02-23 DIAGNOSIS — R809 Proteinuria, unspecified: Secondary | ICD-10-CM

## 2014-02-23 DIAGNOSIS — E8581 Light chain (AL) amyloidosis: Secondary | ICD-10-CM

## 2014-02-23 LAB — UIFE/LIGHT CHAINS/TP QN, 24-HR UR
ALBUMIN, U: DETECTED
ALPHA 1 UR: DETECTED — AB
ALPHA 2 UR: DETECTED — AB
BETA UR: DETECTED — AB
FREE KAPPA LT CHAINS, UR: 0.62 mg/dL (ref 0.14–2.42)
Free Kappa/Lambda Ratio: 3.44 ratio (ref 2.04–10.37)
Free Lambda Excretion/Day: 5.4 mg/d
Free Lambda Lt Chains,Ur: 0.18 mg/dL (ref 0.02–0.67)
Free Lt Chn Excr Rate: 18.6 mg/d
GAMMA UR: DETECTED — AB
Time: 24 hours
Total Protein, Urine-Ur/day: 1560 mg/d — ABNORMAL HIGH (ref 10–140)
Total Protein, Urine: 52 mg/dL
Volume, Urine: 3000 mL

## 2014-02-23 NOTE — Telephone Encounter (Signed)
gv pt appt schedule for march/april °

## 2014-02-23 NOTE — Progress Notes (Signed)
Hematology and Oncology Follow Up Visit  Eugene Martinez [id number] [birth year] 67 y.o. 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 He had a significant elevation of pro BNP but normal troponin levels. Normal estimated ejection fraction both on echocardiogram and cardiac MRI.  He was started on a chemotherapy program with oral Cytoxan, subcutaneous Velcade, and oral dexamethasone. He was doing very well on the program until July, 2014 when he got dehydrated, had rapid weight and strength loss. Treatment was interrupted and subsequently resumed with a modified dosing schedule giving the Velcade every 2 weeks, Decadron 40 mg every 2 weeks, and deleting the Cytoxan. He did much better since then with improved performance status.  Laboratory profile remains stable with normalization of serum free light chains. Kappa lambda light chain ratio 1.48 with lambda free light chain 0.95 mg percent on 11/13/2013.  Most recent 24-hour urine done through our office on 02/19/14. Total protein far urine collection steadily falling down from 4.9 grams in January 2014 the most recent value of 1.4 g as of 12/25/2013. He just submitted another specimen on March 13 and results are pending. . No monoclonal protein seen on IFE of the urine. Hematologic profile remains stable with hemoglobin 12.2, white count 8200, platelets 127,000 on March 13.   He has been under consideration for high-dose chemotherapy consolidation with stem cell support. He just had a visit at [facility] last week. Plan is  to collect stem cells in May and then consider high-dose chemotherapy consolidation with autologous stem cell support.  He sometimes with his eyes. He had bilateral conjunctivitis right greater than left. Responding to antibiotics. Over the last week she's had some diplopia. He saw his optometrist. No obvious intra-ocular pathology. He is going to change the prescription of the lens for the left eye to see if this corrects the problem.  No other interim infections. He denies any paresthesias.  Medications: reviewed  Allergies:  Allergies  Allergen Reactions  . Other Other (See Comments)    Seasonal allergies/grass/trees/dust causes watery eyes, stuffiness, sometimes swelling.    Review of Systems: Constitutional: Appetite is good. Weight improving. Hematology:  No bleeding or bruising ENT ROS: Still having trouble with his eyes. Bilateral conjunctivitis has improved. Having some diplopia. Saw his optometrist. Will have new lenses fitted to see if this corrects the problem. Was told no obvious intraocular pathology. Breast ROS:  Respiratory ROS: No cough or dyspnea Cardiovascular ROS:  No chest pain, chest pressure, palpitations, or orthopnea. Peripheral edema decreased now just one plus. Gastrointestinal ROS:  No abdominal pain or change in bowel habit  Genito-Urinary ROS: Not questioned Musculoskeletal ROS: No muscle bone or joint pain Neurological ROS: No headache or change in vision. No paresthesias. Dermatological ROS: No rash or ecchymosis Remaining ROS negative:   Physical Exam: Blood pressure 130/70, pulse 97, temperature 97.4 F (36.3 C), temperature source Oral, resp. rate 18, height 5\' 5"  (1.651 m), weight 162 lb 14.4 oz (73.891 kg), SpO2 97.00%. Wt Readings from Last 3 Encounters:  02/23/14 162 lb 14.4 oz (73.891 kg)  01/22/14 162 lb 11.2 oz (73.8 kg)  12/11/13 154  lb 12.8 oz (70.217 kg)     General appearance: Thin Caucasian man HENNT: Pharynx no erythema, exudate,  mass, or ulcer. No thyromegaly or thyroid nodules Lymph nodes: No cervical, supraclavicular, or axillary lymphadenopathy Breasts: Lungs: Clear to auscultation, resonant to percussion throughout Heart: Regular rhythm, no murmur, no gallop, no rub, no click, no edema Abdomen: Soft, nontender, normal bowel sounds, no mass, no organomegaly Extremities: No edema, no calf tenderness Musculoskeletal: no joint deformities GU:  Vascular: Carotid pulses 2+, no bruits,  Neurologic: Alert, oriented, PERRLA, , cranial nerves grossly normal, motor strength 5 over 5, reflexes 1+ symmetric, upper body coordination normal, gait normal, Skin: No rash or ecchymosis  Lab Results: CBC W/Diff    Component Value Date/Time   WBC 8.2 02/19/2014 1006   WBC 19.6 Repeated and verified X2.* 02/20/2013 1157   RBC 4.07* 02/19/2014 1006   RBC 4.11* 02/20/2013 1157   RBC 4.02* 10/30/2012 0911   HGB 12.2* 02/19/2014 1006   HGB 12.6* 02/20/2013 1157   HCT 37.1* 02/19/2014 1006   HCT 37.0* 02/20/2013 1157   PLT 127* 02/19/2014 1006   PLT 146.0* 02/20/2013 1157   MCV 91.4 02/19/2014 1006   MCV 90.0 02/20/2013 1157   MCH 30.0 02/19/2014 1006   MCH 30.5 01/24/2013 0500   MCHC 32.8 02/19/2014 1006   MCHC 34.2 02/20/2013 1157   RDW 15.5* 02/19/2014 1006   RDW 14.3 02/20/2013 1157   LYMPHSABS 0.4* 02/19/2014 1006   LYMPHSABS 1.5 02/20/2013 1157   MONOABS 0.3 02/19/2014 1006   MONOABS 1.5* 02/20/2013 1157   EOSABS 0.1 02/19/2014 1006   EOSABS 0.0 02/20/2013 1157   BASOSABS 0.0 02/19/2014 1006   BASOSABS 0.0 02/20/2013 1157     Chemistry      Component Value Date/Time   NA 141 02/19/2014 1006   NA 135 02/20/2013 1157   K 4.2 02/19/2014 1006   K 3.9 02/20/2013 1157   CL 100 05/22/2013 0922   CL 103 02/20/2013 1157   CO2 23 02/19/2014 1006   CO2 27 02/20/2013 1157   BUN 21.0 02/19/2014 1006   BUN 30* 02/20/2013 1157   CREATININE 0.9 02/19/2014 1006   CREATININE 1.1 02/20/2013 1157   CREATININE 0.93 12/15/2012 0832      Component Value  Date/Time   CALCIUM 9.1 02/19/2014 1006   CALCIUM 8.2* 02/20/2013 1157   ALKPHOS 69 02/19/2014 1006   ALKPHOS 80 01/21/2013 0932   AST 21 02/19/2014 1006   AST 22 01/21/2013 0932   ALT 17 02/19/2014 1006   ALT 18 01/21/2013 0932   BILITOT 0.58 02/19/2014 1006   BILITOT 0.8 01/21/2013 0932         Impression:  #1. AL amyloidosis responding to treatment outlined above. Plan: Continue every other week Velcade through April 10. Then a way for instructions from [place] with respect to G.-CSF mobilization of stem cells and stem cell harvest.  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  I will transition his care to Dr. Benay Spice   CC: Patient Care Team: Tracie Harrier, MD as PCP - General (Internal Medicine) Annia Belt, MD as Consulting Physician (Oncology) Darlin Coco, MD as Consulting Physician (Cardiology) Jeanann Lewandowsky as Consulting Physician (Internal Medicine)   Annia Belt, MD 3/17/201510:40 AM

## 2014-03-05 ENCOUNTER — Ambulatory Visit (HOSPITAL_BASED_OUTPATIENT_CLINIC_OR_DEPARTMENT_OTHER): Payer: BC Managed Care – PPO

## 2014-03-05 ENCOUNTER — Other Ambulatory Visit (HOSPITAL_BASED_OUTPATIENT_CLINIC_OR_DEPARTMENT_OTHER): Payer: BC Managed Care – PPO

## 2014-03-05 VITALS — BP 118/65 | HR 94 | Temp 98.0°F | Resp 16

## 2014-03-05 DIAGNOSIS — E8581 Light chain (AL) amyloidosis: Secondary | ICD-10-CM

## 2014-03-05 DIAGNOSIS — E8589 Other amyloidosis: Secondary | ICD-10-CM

## 2014-03-05 DIAGNOSIS — R803 Bence Jones proteinuria: Secondary | ICD-10-CM

## 2014-03-05 DIAGNOSIS — Z5112 Encounter for antineoplastic immunotherapy: Secondary | ICD-10-CM

## 2014-03-05 DIAGNOSIS — R809 Proteinuria, unspecified: Secondary | ICD-10-CM

## 2014-03-05 LAB — CBC WITH DIFFERENTIAL/PLATELET
BASO%: 0.4 % (ref 0.0–2.0)
Basophils Absolute: 0 10*3/uL (ref 0.0–0.1)
EOS%: 0.9 % (ref 0.0–7.0)
Eosinophils Absolute: 0.1 10*3/uL (ref 0.0–0.5)
HCT: 36.3 % — ABNORMAL LOW (ref 38.4–49.9)
HGB: 12 g/dL — ABNORMAL LOW (ref 13.0–17.1)
LYMPH%: 5.5 % — ABNORMAL LOW (ref 14.0–49.0)
MCH: 30.2 pg (ref 27.2–33.4)
MCHC: 33 g/dL (ref 32.0–36.0)
MCV: 91.6 fL (ref 79.3–98.0)
MONO#: 0.3 10*3/uL (ref 0.1–0.9)
MONO%: 3.3 % (ref 0.0–14.0)
NEUT#: 7.5 10*3/uL — ABNORMAL HIGH (ref 1.5–6.5)
NEUT%: 89.9 % — ABNORMAL HIGH (ref 39.0–75.0)
Platelets: 126 10*3/uL — ABNORMAL LOW (ref 140–400)
RBC: 3.96 10*6/uL — AB (ref 4.20–5.82)
RDW: 15.6 % — ABNORMAL HIGH (ref 11.0–14.6)
WBC: 8.3 10*3/uL (ref 4.0–10.3)
lymph#: 0.5 10*3/uL — ABNORMAL LOW (ref 0.9–3.3)

## 2014-03-05 MED ORDER — ONDANSETRON HCL 8 MG PO TABS
8.0000 mg | ORAL_TABLET | Freq: Once | ORAL | Status: AC
  Administered 2014-03-05: 8 mg via ORAL

## 2014-03-05 MED ORDER — ONDANSETRON HCL 8 MG PO TABS
ORAL_TABLET | ORAL | Status: AC
  Filled 2014-03-05: qty 1

## 2014-03-05 MED ORDER — BORTEZOMIB CHEMO SQ INJECTION 3.5 MG (2.5MG/ML)
1.3000 mg/m2 | Freq: Once | INTRAMUSCULAR | Status: AC
  Administered 2014-03-05: 2.25 mg via SUBCUTANEOUS
  Filled 2014-03-05: qty 2.25

## 2014-03-05 NOTE — Patient Instructions (Signed)
[facility] Discharge Instructions for Patients Receiving Chemotherapy  Today you received the following chemotherapy agents Velcade.   To help prevent nausea and vomiting after your treatment, we encourage you to take your nausea medication as prescribed.    If you develop nausea and vomiting that is not controlled by your nausea medication, call the clinic.   BELOW ARE SYMPTOMS THAT SHOULD BE REPORTED IMMEDIATELY:  *FEVER GREATER THAN 100.5 F  *CHILLS WITH OR WITHOUT FEVER  NAUSEA AND VOMITING THAT IS NOT CONTROLLED WITH YOUR NAUSEA MEDICATION  *UNUSUAL SHORTNESS OF BREATH  *UNUSUAL BRUISING OR BLEEDING  TENDERNESS IN MOUTH AND THROAT WITH OR WITHOUT PRESENCE OF ULCERS  *URINARY PROBLEMS  *BOWEL PROBLEMS  UNUSUAL RASH Items with * indicate a potential emergency and should be followed up as soon as possible.  Feel free to call the clinic should you have any questions or concerns. The clinic phone number is [phone].  It was my pleasure to take care of you today!  Leeanne Rio, RN

## 2014-03-18 ENCOUNTER — Other Ambulatory Visit: Payer: Self-pay

## 2014-03-19 ENCOUNTER — Ambulatory Visit (HOSPITAL_BASED_OUTPATIENT_CLINIC_OR_DEPARTMENT_OTHER): Payer: BC Managed Care – PPO

## 2014-03-19 ENCOUNTER — Other Ambulatory Visit (HOSPITAL_BASED_OUTPATIENT_CLINIC_OR_DEPARTMENT_OTHER): Payer: BC Managed Care – PPO

## 2014-03-19 VITALS — BP 113/61 | HR 105 | Temp 98.1°F | Resp 18

## 2014-03-19 DIAGNOSIS — R803 Bence Jones proteinuria: Secondary | ICD-10-CM

## 2014-03-19 DIAGNOSIS — E8589 Other amyloidosis: Secondary | ICD-10-CM

## 2014-03-19 DIAGNOSIS — E8581 Light chain (AL) amyloidosis: Secondary | ICD-10-CM

## 2014-03-19 DIAGNOSIS — Z5112 Encounter for antineoplastic immunotherapy: Secondary | ICD-10-CM

## 2014-03-19 LAB — COMPREHENSIVE METABOLIC PANEL (CC13)
ALT: 20 U/L (ref 0–55)
AST: 21 U/L (ref 5–34)
Albumin: 3.1 g/dL — ABNORMAL LOW (ref 3.5–5.0)
Alkaline Phosphatase: 77 U/L (ref 40–150)
Anion Gap: 10 mEq/L (ref 3–11)
BILIRUBIN TOTAL: 0.56 mg/dL (ref 0.20–1.20)
BUN: 18.8 mg/dL (ref 7.0–26.0)
CO2: 25 mEq/L (ref 22–29)
Calcium: 8.7 mg/dL (ref 8.4–10.4)
Chloride: 105 mEq/L (ref 98–109)
Creatinine: 1 mg/dL (ref 0.7–1.3)
Glucose: 218 mg/dl — ABNORMAL HIGH (ref 70–140)
Potassium: 3.8 mEq/L (ref 3.5–5.1)
Sodium: 139 mEq/L (ref 136–145)
Total Protein: 5.6 g/dL — ABNORMAL LOW (ref 6.4–8.3)

## 2014-03-19 LAB — CBC WITH DIFFERENTIAL/PLATELET
BASO%: 0.4 % (ref 0.0–2.0)
Basophils Absolute: 0 10*3/uL (ref 0.0–0.1)
EOS%: 0.2 % (ref 0.0–7.0)
Eosinophils Absolute: 0 10*3/uL (ref 0.0–0.5)
HCT: 37.2 % — ABNORMAL LOW (ref 38.4–49.9)
HEMOGLOBIN: 12 g/dL — AB (ref 13.0–17.1)
LYMPH%: 3.2 % — AB (ref 14.0–49.0)
MCH: 29.6 pg (ref 27.2–33.4)
MCHC: 32.4 g/dL (ref 32.0–36.0)
MCV: 91.5 fL (ref 79.3–98.0)
MONO#: 0.1 10*3/uL (ref 0.1–0.9)
MONO%: 1.2 % (ref 0.0–14.0)
NEUT#: 6.3 10*3/uL (ref 1.5–6.5)
NEUT%: 95 % — ABNORMAL HIGH (ref 39.0–75.0)
PLATELETS: 138 10*3/uL — AB (ref 140–400)
RBC: 4.07 10*6/uL — ABNORMAL LOW (ref 4.20–5.82)
RDW: 15.5 % — ABNORMAL HIGH (ref 11.0–14.6)
WBC: 6.6 10*3/uL (ref 4.0–10.3)
lymph#: 0.2 10*3/uL — ABNORMAL LOW (ref 0.9–3.3)

## 2014-03-19 MED ORDER — BORTEZOMIB CHEMO SQ INJECTION 3.5 MG (2.5MG/ML)
1.3000 mg/m2 | Freq: Once | INTRAMUSCULAR | Status: AC
  Administered 2014-03-19: 2.25 mg via SUBCUTANEOUS
  Filled 2014-03-19: qty 2.25

## 2014-03-19 MED ORDER — ONDANSETRON HCL 8 MG PO TABS
8.0000 mg | ORAL_TABLET | Freq: Once | ORAL | Status: AC
  Administered 2014-03-19: 8 mg via ORAL

## 2014-03-19 MED ORDER — ONDANSETRON HCL 8 MG PO TABS
ORAL_TABLET | ORAL | Status: AC
  Filled 2014-03-19: qty 1

## 2014-03-19 NOTE — Patient Instructions (Signed)

## 2014-03-22 LAB — KAPPA/LAMBDA LIGHT CHAINS
Kappa free light chain: 0.84 mg/dL (ref 0.33–1.94)
Kappa:Lambda Ratio: 0.89 (ref 0.26–1.65)
Lambda Free Lght Chn: 0.94 mg/dL (ref 0.57–2.63)

## 2014-03-23 ENCOUNTER — Ambulatory Visit (HOSPITAL_BASED_OUTPATIENT_CLINIC_OR_DEPARTMENT_OTHER): Payer: BC Managed Care – PPO | Admitting: Oncology

## 2014-03-23 ENCOUNTER — Telehealth: Payer: Self-pay | Admitting: Oncology

## 2014-03-23 VITALS — BP 134/72 | HR 92 | Temp 97.9°F | Resp 18 | Ht 65.0 in | Wt 164.6 lb

## 2014-03-23 DIAGNOSIS — R809 Proteinuria, unspecified: Secondary | ICD-10-CM

## 2014-03-23 DIAGNOSIS — I428 Other cardiomyopathies: Secondary | ICD-10-CM

## 2014-03-23 DIAGNOSIS — E8581 Light chain (AL) amyloidosis: Secondary | ICD-10-CM

## 2014-03-23 DIAGNOSIS — E8589 Other amyloidosis: Secondary | ICD-10-CM

## 2014-03-23 NOTE — Telephone Encounter (Signed)
gv adn printed appt sched and avs for pt for May °

## 2014-03-23 NOTE — Progress Notes (Signed)
  [place] OFFICE PROGRESS NOTE   Diagnosis: AL amyloidosis  INTERVAL HISTORY:   Eugene Martinez has been followed by Dr. Beryle Beams since being diagnosed with amyloidosis in December 2013. He was most recently treated with Velcade/Decadron on a 2 week schedule, last given 03/19/2014.  He has been evaluated the [facility] bone marrow transplant clinic. He is scheduled undergo stem cell collection in approximately 2 weeks. The [facility] physicians have recommended observation.  Eugene Martinez feels well. No specific complaint. He has intermittent bruising at the eyelids. No dyspnea or neuropathy symptoms.  Objective:  Vital signs in last 24 hours:  Blood pressure 134/72, pulse 92, temperature 97.9 F (36.6 C), temperature source Oral, resp. rate 18, height 5\' 5"  (1.651 m), weight 164 lb 9.6 oz (74.662 kg), SpO2 100.00%.    HEENT: Small ecchymoses at the upper eyelid bilaterally, oropharynx without thrush Resp: Lungs clear bilaterally Cardio: Regular rate and rhythm GI: No hepatosplenomegaly Vascular: No leg edema, right leg brace in place   Lab Results:  Lab Results  Component Value Date   WBC 6.6 03/19/2014   HGB 12.0* 03/19/2014   HCT 37.2* 03/19/2014   MCV 91.5 03/19/2014   PLT 138* 03/19/2014   NEUTROABS 6.3 03/19/2014   Serum free lambda light chains at 0.94 24-hour urine total protein excretion on 02/19/2014-1560 mg, 5.4 mg of free lambda light chains    Medications: I have reviewed the patient's current medications.  Assessment/Plan:  1. 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-most recently treated with every 2 week Velcade/Decadron, last given 03/19/2014  2. 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 Cardiomyopathy secondary to #1  Disposition:  He is in clinical remission from the amyloidosis after treatment with Velcade-based therapy. Eugene Martinez is scheduled to undergo stem cell harvest at [facility] within the next 2 weeks. We will place  further Velcade/Decadron on hold.  The serum free lambda light chains were normal and there was low level excretion of urine lambda light chains last month. He will return for an office visit and repeat laboratory evaluation 05/04/2014.  We will confirm the plan for observation with Dr. Beryle Beams and the [facility] bone marrow transplant clinic.  Ladell Pier, MD  03/23/2014  11:25 AM

## 2014-05-04 ENCOUNTER — Telehealth: Payer: Self-pay | Admitting: Oncology

## 2014-05-04 ENCOUNTER — Ambulatory Visit (HOSPITAL_BASED_OUTPATIENT_CLINIC_OR_DEPARTMENT_OTHER): Payer: BC Managed Care – PPO | Admitting: Oncology

## 2014-05-04 ENCOUNTER — Other Ambulatory Visit (HOSPITAL_BASED_OUTPATIENT_CLINIC_OR_DEPARTMENT_OTHER): Payer: BC Managed Care – PPO

## 2014-05-04 VITALS — BP 127/60 | HR 89 | Temp 97.4°F | Resp 18 | Ht 65.0 in | Wt 159.4 lb

## 2014-05-04 DIAGNOSIS — I428 Other cardiomyopathies: Secondary | ICD-10-CM

## 2014-05-04 DIAGNOSIS — E8589 Other amyloidosis: Secondary | ICD-10-CM

## 2014-05-04 DIAGNOSIS — E8581 Light chain (AL) amyloidosis: Secondary | ICD-10-CM

## 2014-05-04 LAB — COMPREHENSIVE METABOLIC PANEL (CC13)
ALT: 16 U/L (ref 0–55)
AST: 21 U/L (ref 5–34)
Albumin: 3 g/dL — ABNORMAL LOW (ref 3.5–5.0)
Alkaline Phosphatase: 69 U/L (ref 40–150)
Anion Gap: 8 mEq/L (ref 3–11)
BUN: 11.9 mg/dL (ref 7.0–26.0)
CALCIUM: 9 mg/dL (ref 8.4–10.4)
CHLORIDE: 107 meq/L (ref 98–109)
CO2: 25 mEq/L (ref 22–29)
Creatinine: 0.8 mg/dL (ref 0.7–1.3)
Glucose: 102 mg/dl (ref 70–140)
Potassium: 4.7 mEq/L (ref 3.5–5.1)
Sodium: 140 mEq/L (ref 136–145)
Total Bilirubin: 0.51 mg/dL (ref 0.20–1.20)
Total Protein: 5.6 g/dL — ABNORMAL LOW (ref 6.4–8.3)

## 2014-05-04 LAB — CBC WITH DIFFERENTIAL/PLATELET
BASO%: 0.6 % (ref 0.0–2.0)
Basophils Absolute: 0 10*3/uL (ref 0.0–0.1)
EOS%: 3.4 % (ref 0.0–7.0)
Eosinophils Absolute: 0.2 10*3/uL (ref 0.0–0.5)
HCT: 36.9 % — ABNORMAL LOW (ref 38.4–49.9)
HGB: 11.9 g/dL — ABNORMAL LOW (ref 13.0–17.1)
LYMPH#: 0.7 10*3/uL — AB (ref 0.9–3.3)
LYMPH%: 10.9 % — ABNORMAL LOW (ref 14.0–49.0)
MCH: 29.8 pg (ref 27.2–33.4)
MCHC: 32.2 g/dL (ref 32.0–36.0)
MCV: 92.4 fL (ref 79.3–98.0)
MONO#: 0.7 10*3/uL (ref 0.1–0.9)
MONO%: 10.8 % (ref 0.0–14.0)
NEUT#: 5.1 10*3/uL (ref 1.5–6.5)
NEUT%: 74.3 % (ref 39.0–75.0)
Platelets: 153 10*3/uL (ref 140–400)
RBC: 4 10*6/uL — ABNORMAL LOW (ref 4.20–5.82)
RDW: 16.5 % — AB (ref 11.0–14.6)
WBC: 6.9 10*3/uL (ref 4.0–10.3)

## 2014-05-04 NOTE — Telephone Encounter (Signed)
gv adn printed appt sched anda vs for pt for Aug °

## 2014-05-04 NOTE — Progress Notes (Signed)
  [place] OFFICE PROGRESS NOTE   Diagnosis: Amyloidosis  INTERVAL HISTORY:   He returns as scheduled. He underwent G-CSF priming followed by stem cell collection at [facility] on 04/06/2014. The a pheresis catheter has been removed. No specific complaint at present.  Objective:  Vital signs in last 24 hours:  Blood pressure 127/60, pulse 89, temperature 97.4 F (36.3 C), temperature source Oral, resp. rate 18, height 5\' 5"  (1.651 m), weight 159 lb 6.4 oz (72.303 kg).   Resp: Lungs clear bilaterally Cardio: Regular rate and rhythm GI: No hepatosplenomegaly Vascular: No leg edema   Lab Results:  Lab Results  Component Value Date   WBC 6.9 05/04/2014   HGB 11.9* 05/04/2014   HCT 36.9* 05/04/2014   MCV 92.4 05/04/2014   PLT 153 05/04/2014   NEUTROABS 5.1 05/04/2014    Medications: I have reviewed the patient's current medications.  Assessment/Plan: 1. 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-most recently treated with every 2 week Velcade/Decadron, last given 03/19/2014   Status post autologous stem cell collection at [facility] 04/06/2014 2. 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 Cardiomyopathy secondary to #1    Disposition:  Mr. Hirano appears well. He underwent stem cell collection last month. He is now off of specific therapy for the amyloidosis. We will followup on the 24-hour urine protein and serum light chains from today. He will return for an office and lab visit in 3 months. Per the note of Dr. Alvie Heidelberg dated 11/19/2013 he will be followed with observation.      Ladell Pier, MD  05/04/2014  11:18 AM

## 2014-05-05 LAB — KAPPA/LAMBDA LIGHT CHAINS
Kappa free light chain: 1.33 mg/dL (ref 0.33–1.94)
Kappa:Lambda Ratio: 0.99 (ref 0.26–1.65)
LAMBDA FREE LGHT CHN: 1.35 mg/dL (ref 0.57–2.63)

## 2014-05-06 LAB — UPEP/TP, 24-HR URINE
ALPHA-2-GLOBULIN, U: 5.5 %
Albumin: 73.6 %
Alpha-1-Globulin, U: 12.8 %
Beta Globulin, U: 6.8 %
Collection Interval: 24 hours
Gamma Globulin, U: 1.3 %
TOTAL PROTEIN, URINE/DAY: 2400 mg/d — AB (ref 50–100)
TOTAL PROTEIN, URINE: 75 mg/dL
TOTAL VOLUME, URINE: 3200 mL

## 2014-05-07 ENCOUNTER — Telehealth: Payer: Self-pay | Admitting: *Deleted

## 2014-05-07 NOTE — Telephone Encounter (Signed)
Called pt with lab results per Dr. Benay Spice. He voiced understanding. Instructed him to return 24 hour urine one week prior to next visit to have results back during visit. He voiced understanding.

## 2014-05-07 NOTE — Telephone Encounter (Signed)
Message copied by Brien Few on Fri May 07, 2014  4:15 PM ------      Message from: Betsy Coder B      Created: Thu May 06, 2014  7:38 PM       Please call patient, urine protein remains elevated -no myeloma protein, serum lambda light chains are normal, f/u as scheduled to include 24hr UPEP prior to office ------

## 2014-08-03 ENCOUNTER — Other Ambulatory Visit: Payer: Self-pay | Admitting: *Deleted

## 2014-08-03 ENCOUNTER — Other Ambulatory Visit (HOSPITAL_BASED_OUTPATIENT_CLINIC_OR_DEPARTMENT_OTHER): Payer: BC Managed Care – PPO

## 2014-08-03 ENCOUNTER — Telehealth: Payer: Self-pay | Admitting: Oncology

## 2014-08-03 ENCOUNTER — Ambulatory Visit (HOSPITAL_BASED_OUTPATIENT_CLINIC_OR_DEPARTMENT_OTHER): Payer: BC Managed Care – PPO | Admitting: Oncology

## 2014-08-03 VITALS — BP 112/59 | HR 93 | Temp 98.2°F | Resp 20 | Ht 65.0 in | Wt 159.6 lb

## 2014-08-03 DIAGNOSIS — E8581 Light chain (AL) amyloidosis: Secondary | ICD-10-CM

## 2014-08-03 DIAGNOSIS — E8589 Other amyloidosis: Secondary | ICD-10-CM

## 2014-08-03 DIAGNOSIS — I428 Other cardiomyopathies: Secondary | ICD-10-CM

## 2014-08-03 LAB — CBC WITH DIFFERENTIAL/PLATELET
BASO%: 0.7 % (ref 0.0–2.0)
Basophils Absolute: 0 10*3/uL (ref 0.0–0.1)
EOS%: 3.7 % (ref 0.0–7.0)
Eosinophils Absolute: 0.2 10*3/uL (ref 0.0–0.5)
HEMATOCRIT: 38.3 % — AB (ref 38.4–49.9)
HGB: 13.4 g/dL (ref 13.0–17.1)
LYMPH#: 1.1 10*3/uL (ref 0.9–3.3)
LYMPH%: 20.5 % (ref 14.0–49.0)
MCH: 31.2 pg (ref 27.2–33.4)
MCHC: 35 g/dL (ref 32.0–36.0)
MCV: 89.3 fL (ref 79.3–98.0)
MONO#: 0.6 10*3/uL (ref 0.1–0.9)
MONO%: 10.1 % (ref 0.0–14.0)
NEUT#: 3.6 10*3/uL (ref 1.5–6.5)
NEUT%: 65 % (ref 39.0–75.0)
Platelets: 129 10*3/uL — ABNORMAL LOW (ref 140–400)
RBC: 4.29 10*6/uL (ref 4.20–5.82)
RDW: 14.6 % (ref 11.0–14.6)
WBC: 5.5 10*3/uL (ref 4.0–10.3)
nRBC: 0 % (ref 0–0)

## 2014-08-03 LAB — COMPREHENSIVE METABOLIC PANEL (CC13)
ALBUMIN: 3.4 g/dL — AB (ref 3.5–5.0)
ALT: 17 U/L (ref 0–55)
AST: 27 U/L (ref 5–34)
Alkaline Phosphatase: 70 U/L (ref 40–150)
Anion Gap: 8 mEq/L (ref 3–11)
BUN: 26.6 mg/dL — AB (ref 7.0–26.0)
CO2: 31 mEq/L — ABNORMAL HIGH (ref 22–29)
CREATININE: 1 mg/dL (ref 0.7–1.3)
Calcium: 9 mg/dL (ref 8.4–10.4)
Chloride: 102 mEq/L (ref 98–109)
Glucose: 129 mg/dl (ref 70–140)
Potassium: 3.8 mEq/L (ref 3.5–5.1)
Sodium: 140 mEq/L (ref 136–145)
Total Bilirubin: 0.74 mg/dL (ref 0.20–1.20)
Total Protein: 6 g/dL — ABNORMAL LOW (ref 6.4–8.3)

## 2014-08-03 NOTE — Telephone Encounter (Signed)
gv and printed appt sched and avs for pt fro NOV.Marland KitchenMarland Kitchen

## 2014-08-03 NOTE — Progress Notes (Signed)
  [place] OFFICE PROGRESS NOTE   Diagnosis: Amyloidosis  INTERVAL HISTORY:   He returns as scheduled. He feels well. No edema.  Objective:  Vital signs in last 24 hours:  Blood pressure 112/59, pulse 93, temperature 98.2 F (36.8 C), temperature source Oral, resp. rate 20, height 5\' 5"  (1.651 m), weight 159 lb 9.6 oz (72.394 kg).    HEENT: No thrush Resp: Lungs clear bilaterally Cardio: Regular rate and rhythm GI: No hepatosplenomegaly Vascular: No leg edema  Skin: Minimal purpura at the eyelids     Lab Results:  Lab Results  Component Value Date   WBC 5.5 08/03/2014   HGB 13.4 08/03/2014   HCT 38.3* 08/03/2014   MCV 89.3 08/03/2014   PLT 129* 08/03/2014   NEUTROABS 3.6 08/03/2014    Medications: I have reviewed the patient's current medications.  Assessment/Plan: 1. 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-most recently treated with every 2 week Velcade/Decadron, last given 03/19/2014  Status post autologous stem cell collection at [facility] 04/06/2014 2. 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 Cardiomyopathy secondary to #1    Disposition:  There is no clinical evidence for progression of amyloidosis. We will followup on the 24-hour urine electrophoresis and serum light chains from today. He will return for an office and lab visit in 3 months. We will be sure he has a followup with Dr. Mare Ferrari to evaluate the cardiomyopathy.  Betsy Coder, MD  08/03/2014  11:12 AM

## 2014-08-04 LAB — KAPPA/LAMBDA LIGHT CHAINS
KAPPA LAMBDA RATIO: 0.85 (ref 0.26–1.65)
Kappa free light chain: 1.11 mg/dL (ref 0.33–1.94)
LAMBDA FREE LGHT CHN: 1.31 mg/dL (ref 0.57–2.63)

## 2014-08-05 ENCOUNTER — Other Ambulatory Visit: Payer: Self-pay | Admitting: *Deleted

## 2014-08-05 DIAGNOSIS — E8581 Light chain (AL) amyloidosis: Secondary | ICD-10-CM

## 2014-08-05 LAB — UIFE/LIGHT CHAINS/TP QN, 24-HR UR
Albumin, U: DETECTED
Alpha 1, Urine: DETECTED — AB
Alpha 2, Urine: DETECTED — AB
Beta, Urine: DETECTED — AB
Gamma Globulin, Urine: DETECTED — AB
TOTAL PROTEIN, URINE-UPE24: 47 mg/dL — AB (ref 5–25)
Time: 24 hours
Total Protein, Urine-Ur/day: 1457 mg/d — ABNORMAL HIGH (ref <150)
Volume, Urine: 3100 mL

## 2014-08-06 ENCOUNTER — Telehealth: Payer: Self-pay | Admitting: *Deleted

## 2014-08-06 NOTE — Telephone Encounter (Signed)
Message copied by Domenic Schwab on Fri Aug 06, 2014  5:03 PM ------      Message from: Betsy Coder B      Created: Thu Aug 05, 2014  5:03 PM       Please call patient, urine protein and serum light chains are stable, f/u as scheduled ------

## 2014-08-06 NOTE — Telephone Encounter (Signed)
Per Dr. Benay Spice; notified pt's wife that urine protein and serum light chains are stable.  Pt's wife verbalized understanding and confirmed appt for 10/29/14.

## 2014-08-10 ENCOUNTER — Telehealth: Payer: Self-pay | Admitting: Oncology

## 2014-08-10 NOTE — Telephone Encounter (Signed)
s/w pt wife re appt for Dr. Mare Ferrari 9/22. appt scheduled per 2nd 8/25 pof. request for 81mo f/u per prior pof was already complete.

## 2014-08-31 ENCOUNTER — Ambulatory Visit (INDEPENDENT_AMBULATORY_CARE_PROVIDER_SITE_OTHER): Payer: BC Managed Care – PPO | Admitting: Cardiology

## 2014-08-31 ENCOUNTER — Encounter: Payer: Self-pay | Admitting: Cardiology

## 2014-08-31 VITALS — BP 126/68 | HR 95 | Ht 66.0 in | Wt 164.0 lb

## 2014-08-31 DIAGNOSIS — I452 Bifascicular block: Secondary | ICD-10-CM

## 2014-08-31 DIAGNOSIS — E639 Nutritional deficiency, unspecified: Secondary | ICD-10-CM

## 2014-08-31 DIAGNOSIS — E8589 Other amyloidosis: Secondary | ICD-10-CM

## 2014-08-31 DIAGNOSIS — I10 Essential (primary) hypertension: Secondary | ICD-10-CM

## 2014-08-31 DIAGNOSIS — I4510 Unspecified right bundle-branch block: Secondary | ICD-10-CM

## 2014-08-31 DIAGNOSIS — E854 Organ-limited amyloidosis: Secondary | ICD-10-CM

## 2014-08-31 DIAGNOSIS — I5032 Chronic diastolic (congestive) heart failure: Secondary | ICD-10-CM

## 2014-08-31 DIAGNOSIS — I43 Cardiomyopathy in diseases classified elsewhere: Principal | ICD-10-CM

## 2014-08-31 DIAGNOSIS — E889 Metabolic disorder, unspecified: Secondary | ICD-10-CM

## 2014-08-31 NOTE — Assessment & Plan Note (Signed)
The patient has not had any orthopnea or paroxysmal nocturnal dyspnea or significant ankle edema.

## 2014-08-31 NOTE — Patient Instructions (Signed)
Your physician has requested that you have an echocardiogram. Echocardiography is a painless test that uses sound waves to create images of your heart. It provides your doctor with information about the size and shape of your heart and how well your heart's chambers and valves are working. This procedure takes approximately one hour. There are no restrictions for this procedure.  Your physician recommends that you continue on your current medications as directed. Please refer to the Current Medication list given to you today.  Your physician wants you to follow-up in: 1 year ov/ekg You will receive a reminder letter in the mail two months in advance. If you don't receive a letter, please call our office to schedule the follow-up appointment.

## 2014-08-31 NOTE — Progress Notes (Signed)
Eugene Martinez Date of Birth:  [birth year] Eugene Martinez [address] [place], [place]  [address] [phone]        Fax   [phone]   History of Present Illness: This pleasant 68 year old gentleman is seen back for a followup office visit.  We last saw him in March 2014. He had been previously hospitalized at [facility] after experiencing exertional syncope while walking [place]. Workup in the Martinez revealed significant left ventricular hypertrophy and a speckling of the myocardium on two-dimensional echocardiogram suggestive of possible amyloid heart disease. Systolic function was normal with ejection fraction of 55-60% and the left atrium was moderately dilated. The patient had a cardiac MRI looking for amyloid heart disease. Left ventricular wall motion was normal with an ejection fraction of 66%. There appeared to be mild right ventricular hypertrophy and normal right atrial size and mild to moderate mitral regurgitation. When delayed enhancement imaging was attempted, they were unable to null the left ventricular myocardium. The patient was monitored in the [facility] and had no arrhythmias. His blood pressure remained low normal and he was discharged on no medication for blood pressure but with the instructions to take  Lasix as needed if his weight increased or if he had unusual shortness of breath. 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 Further outpatient workup included a fat pad biopsy which was not diagnostic for myeloma or amyloidosis and a bone marrow biopsy which was not diagnostic. 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 Since I last saw him he has completed chemotherapy for his amyloidosis and has been in remission since December 2014.  He has had previous harvest of stem cells down  at [facility] but has not had a stem cell transplant. He has been doing well from a cardiac standpoint.  He denies any shortness of breath with activity.  He is not experiencing any paroxysmal nocturnal dyspnea.  He rarely has any ankle edema.  He has not been getting any regular exercise but intends to do more after he retires at the end of the year.  He is an Optometrist.   Current Outpatient Prescriptions  Medication Sig Dispense Refill  . acyclovir (ZOVIRAX) 400 MG tablet TAKE 1 TABLET BY MOUTH TWICE A DAY  60 tablet  11  . azelastine (ASTELIN) 137 MCG/SPRAY nasal spray Place 1-2 sprays into both nostrils every 12 (twelve) hours as needed (allergies).       . cholecalciferol (VITAMIN D) 1000 UNITS tablet Take 1,000 Units by mouth at bedtime.       . ferrous gluconate (FERGON) 324 MG tablet Take 1 tablet by mouth daily.      . finasteride (PROSCAR) 5 MG tablet Take 5 mg by mouth every morning.       Marland Kitchen levocetirizine (XYZAL) 5 MG tablet Take 5 mg by mouth at bedtime. To relieve allergy symptoms      . montelukast (SINGULAIR) 10 MG tablet Take 10 mg by mouth at bedtime.      . Multiple Vitamins-Minerals (CENTRUM SILVER ULTRA MENS) TABS Take 1 tablet by mouth every morning.       . Omega-3 Fatty Acids (FISH OIL) 1200 MG CAPS Take 1,200 mg by mouth 2 (two) times daily.       Marland Kitchen omeprazole (PRILOSEC) 20 MG capsule Take 20 mg by mouth daily  before breakfast. Takes 30 minutes before breakfast for acid reflux      . torsemide (DEMADEX) 20 MG tablet Take 20 mg by mouth 2 (two) times daily. Take if wt. Increases  2-5 lbs      . triamcinolone (NASACORT) 55 MCG/ACT nasal inhaler Place 1-2 sprays into both nostrils daily as needed (allergies).       . vitamin E 400 UNIT capsule Take 400 Units by mouth every morning.       Marland Kitchen albuterol (PROVENTIL HFA;VENTOLIN HFA) 108 (90 BASE) MCG/ACT inhaler Inhale 2 puffs into the lungs every 6 (six) hours as needed for shortness of breath.      . ondansetron (ZOFRAN ODT) 8 MG  disintegrating tablet 1 tab PO or SL q6hrs prn nausea/vomiting  25 tablet  11   No current facility-administered medications for this visit.    Allergies  Allergen Reactions  . Other Other (See Comments)    Seasonal allergies/grass/trees/dust causes watery eyes, stuffiness, sometimes swelling.    Patient Active Problem List   Diagnosis Date Noted  . Weight loss, unintentional 06/29/2013  . AL amyloidosis 02/13/2013  . Weight loss, non-intentional 12/23/2012  . Chronic diastolic heart failure 75/09/2584  . Proteinuria, Bence Jones 11/18/2012  . Syncope 10/29/2012  . Multiple allergies 10/29/2012  . Hypertension 10/29/2012  . 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 Proteinuria 10/29/2012    History  Smoking status  . Never Smoker   Smokeless tobacco  . Never Used    History  Alcohol Use No    No family history on file.  Review of Systems: Constitutional: no fever chills diaphoresis or fatigue or change in weight.  Head and neck: no hearing loss, no epistaxis, no photophobia or visual disturbance. Respiratory: No cough, shortness of breath or wheezing. Cardiovascular: No chest pain peripheral edema, palpitations. Gastrointestinal: No abdominal distention, no abdominal pain, no change in bowel habits hematochezia or melena. Genitourinary: No dysuria, no frequency, no urgency, no nocturia. Musculoskeletal:No arthralgias, no back pain, no gait disturbance or myalgias. Neurological: No dizziness, no headaches, no numbness, no seizures, no syncope, no weakness, no tremors. Hematologic: No lymphadenopathy, no easy bruising. Psychiatric: No confusion, no hallucinations, no sleep disturbance.    Physical Exam: Filed Vitals:   08/31/14 1429  BP: 126/68  Pulse: 95   The patient appears to be in no distress.  Head and neck exam reveals that the pupils are equal and reactive.  The extraocular movements are full.  There is no  scleral icterus.  Mouth and pharynx are benign.  No lymphadenopathy.  No carotid bruits.  The jugular venous pressure is normal.  Thyroid is not enlarged or tender.  Chest is clear to percussion and auscultation.  No rales or rhonchi.  Expansion of the chest is symmetrical.  Heart reveals no abnormal lift or heave.  First and second heart sounds are normal.  There is a soft systolic ejection murmur at the base.  There is a faint S3 gallop.  The abdomen is soft and nontender.  Bowel sounds are normoactive.  There is no hepatosplenomegaly or mass.  There are no abdominal bruits.  Extremities reveal no phlebitis or edema.  Pedal pulses are good.  There is no cyanosis or clubbing.  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  No sensory deficits.  Integument reveals no rash  EKG shows normal sinus rhythm with possible left atrial enlargement and  bifascicular block.  Since last tracing of 02/20/13, no significant change.  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 Clinically at this point he is doing well.  No new medications indicated at this point.  Recheck in one year for office visit and EKG

## 2014-08-31 NOTE — Assessment & Plan Note (Signed)
EKG today shows stable bifascicular block.  The patient has had no further episodes of dizziness or syncope.  He states that last summer 2014 he wore a heart monitor for a month at [facility] and no arrhythmias were detected

## 2014-08-31 NOTE — Assessment & Plan Note (Signed)
He has a previous history of high blood pressure but presently is blood pressure is normal on no blood pressure medication.

## 2014-09-07 ENCOUNTER — Ambulatory Visit (HOSPITAL_COMMUNITY): Payer: BC Managed Care – PPO | Attending: Internal Medicine | Admitting: Radiology

## 2014-09-07 DIAGNOSIS — I43 Cardiomyopathy in diseases classified elsewhere: Secondary | ICD-10-CM | POA: Diagnosis present

## 2014-09-07 DIAGNOSIS — E639 Nutritional deficiency, unspecified: Secondary | ICD-10-CM | POA: Diagnosis present

## 2014-09-07 DIAGNOSIS — I10 Essential (primary) hypertension: Secondary | ICD-10-CM | POA: Insufficient documentation

## 2014-09-07 DIAGNOSIS — I452 Bifascicular block: Secondary | ICD-10-CM | POA: Insufficient documentation

## 2014-09-07 DIAGNOSIS — E854 Organ-limited amyloidosis: Secondary | ICD-10-CM

## 2014-09-07 DIAGNOSIS — I4510 Unspecified right bundle-branch block: Secondary | ICD-10-CM

## 2014-09-07 DIAGNOSIS — E889 Metabolic disorder, unspecified: Secondary | ICD-10-CM

## 2014-09-07 NOTE — Progress Notes (Signed)
Echocardiogram performed.  

## 2014-10-08 ENCOUNTER — Other Ambulatory Visit: Payer: Self-pay | Admitting: *Deleted

## 2014-10-08 ENCOUNTER — Encounter: Payer: Self-pay | Admitting: Oncology

## 2014-10-08 MED ORDER — TORSEMIDE 20 MG PO TABS: 20.0000 mg | ORAL_TABLET | Freq: Two times a day (BID) | ORAL | Status: DC

## 2014-10-29 ENCOUNTER — Other Ambulatory Visit (HOSPITAL_BASED_OUTPATIENT_CLINIC_OR_DEPARTMENT_OTHER): Payer: BC Managed Care – PPO

## 2014-10-29 ENCOUNTER — Encounter: Payer: Self-pay | Admitting: Nurse Practitioner

## 2014-10-29 ENCOUNTER — Telehealth: Payer: Self-pay | Admitting: Nurse Practitioner

## 2014-10-29 ENCOUNTER — Ambulatory Visit (HOSPITAL_BASED_OUTPATIENT_CLINIC_OR_DEPARTMENT_OTHER): Payer: BC Managed Care – PPO | Admitting: Nurse Practitioner

## 2014-10-29 VITALS — BP 127/62 | HR 86 | Temp 97.6°F | Resp 18 | Ht 66.0 in | Wt 164.6 lb

## 2014-10-29 DIAGNOSIS — R808 Other proteinuria: Secondary | ICD-10-CM

## 2014-10-29 DIAGNOSIS — E858 Other amyloidosis: Secondary | ICD-10-CM

## 2014-10-29 DIAGNOSIS — I429 Cardiomyopathy, unspecified: Secondary | ICD-10-CM

## 2014-10-29 DIAGNOSIS — E8581 Light chain (AL) amyloidosis: Secondary | ICD-10-CM

## 2014-10-29 LAB — CBC WITH DIFFERENTIAL/PLATELET
BASO%: 0.7 % (ref 0.0–2.0)
Basophils Absolute: 0 10*3/uL (ref 0.0–0.1)
EOS ABS: 0.2 10*3/uL (ref 0.0–0.5)
EOS%: 3.3 % (ref 0.0–7.0)
HCT: 38.9 % (ref 38.4–49.9)
HGB: 13 g/dL (ref 13.0–17.1)
LYMPH#: 1.1 10*3/uL (ref 0.9–3.3)
LYMPH%: 18.9 % (ref 14.0–49.0)
MCH: 32.3 pg (ref 27.2–33.4)
MCHC: 33.5 g/dL (ref 32.0–36.0)
MCV: 96.3 fL (ref 79.3–98.0)
MONO#: 0.8 10*3/uL (ref 0.1–0.9)
MONO%: 13.9 % (ref 0.0–14.0)
NEUT%: 63.2 % (ref 39.0–75.0)
NEUTROS ABS: 3.8 10*3/uL (ref 1.5–6.5)
Platelets: 124 10*3/uL — ABNORMAL LOW (ref 140–400)
RBC: 4.03 10*6/uL — AB (ref 4.20–5.82)
RDW: 13.4 % (ref 11.0–14.6)
WBC: 6 10*3/uL (ref 4.0–10.3)

## 2014-10-29 LAB — COMPREHENSIVE METABOLIC PANEL (CC13)
ALBUMIN: 3.5 g/dL (ref 3.5–5.0)
ALK PHOS: 65 U/L (ref 40–150)
ALT: 22 U/L (ref 0–55)
AST: 26 U/L (ref 5–34)
Anion Gap: 10 mEq/L (ref 3–11)
BUN: 29.1 mg/dL — ABNORMAL HIGH (ref 7.0–26.0)
CHLORIDE: 105 meq/L (ref 98–109)
CO2: 28 mEq/L (ref 22–29)
Calcium: 9.2 mg/dL (ref 8.4–10.4)
Creatinine: 1.1 mg/dL (ref 0.7–1.3)
Glucose: 88 mg/dl (ref 70–140)
POTASSIUM: 4.2 meq/L (ref 3.5–5.1)
SODIUM: 143 meq/L (ref 136–145)
TOTAL PROTEIN: 6.1 g/dL — AB (ref 6.4–8.3)
Total Bilirubin: 0.67 mg/dL (ref 0.20–1.20)

## 2014-10-29 NOTE — Progress Notes (Signed)
  [place] OFFICE PROGRESS NOTE   Diagnosis:  amyloidosis  INTERVAL HISTORY:   [name] returns as scheduled. He feels well. He denies pain. No leg swelling. He takes a diuretic as needed with weight gain. No nausea or vomiting. No mouth sores. No constipation or diarrhea. He denies fever. No shortness of breath. He has a slight cough.  Objective:  Vital signs in last 24 hours:  Blood pressure 127/62, pulse 86, temperature 97.6 F (36.4 C), temperature source Oral, resp. rate 18, height 5\' 6"  (1.676 m), weight 164 lb 9.6 oz (74.662 kg), SpO2 98 %.    HEENT: no thrush or ulcers. Lymphatics: no palpable cervical or supraclavicular lymph nodes. Resp: lungs clear bilaterally. Cardio: regular rate and rhythm. GI: abdomen soft and nontender. No organomegaly. Vascular: no leg edema.  Skin: minimal purpura at the eyelid.    Lab Results:  Lab Results  Component Value Date   WBC 6.0 10/29/2014   HGB 13.0 10/29/2014   HCT 38.9 10/29/2014   MCV 96.3 10/29/2014   PLT 124* 10/29/2014   NEUTROABS 3.8 10/29/2014    Imaging:  No results found.  Medications: I have reviewed the patient's current medications.  Assessment/Plan: 1. 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 Followed by Dr. Mare Ferrari   Disposition: Mr. Buenger appears stable. We will followup on the 24-hour urine electrophoresis and serum light chains from today. He has followup at [facility] in December. He will return for a followup visit here in 3 months. He will contact the office in the interim with any problems.  Plan reviewed with Dr. Benay Spice.    Ned Card ANP/GNP-BC   10/29/2014  10:45  AM

## 2014-10-29 NOTE — Telephone Encounter (Signed)
Gave avs & cal for Feb 2016. °

## 2014-11-01 LAB — KAPPA/LAMBDA LIGHT CHAINS
Kappa free light chain: 2.27 mg/dL — ABNORMAL HIGH (ref 0.33–1.94)
Kappa:Lambda Ratio: 1.62 (ref 0.26–1.65)
Lambda Free Lght Chn: 1.4 mg/dL (ref 0.57–2.63)

## 2014-11-02 LAB — UPEP/TP, 24-HR URINE
ALBUMIN UR 24 HR ELECTRO: 70.4 %
ALPHA-1-GLOBULIN, U: 12.8 %
Alpha-2-Globulin, U: 6 %
Beta Globulin, U: 6.1 %
Collection Interval: 24 hours
Gamma Globulin, U: 4.7 %
TOTAL PROTEIN, URINE: 33 mg/dL
Total Protein, Urine/Day: 990 mg/d — ABNORMAL HIGH (ref 50–100)
Total Volume, Urine: 3000 mL

## 2014-11-22 ENCOUNTER — Telehealth: Payer: Self-pay | Admitting: Oncology

## 2014-11-22 NOTE — Telephone Encounter (Signed)
Faxed pt medical records to [facility] °

## 2014-11-24 DIAGNOSIS — G14 Postpolio syndrome: Secondary | ICD-10-CM | POA: Insufficient documentation

## 2015-01-04 ENCOUNTER — Telehealth: Payer: Self-pay | Admitting: Oncology

## 2015-01-04 NOTE — Telephone Encounter (Signed)
Lft msg for pt confirming labs/ov per provider schedule, mailed out schedule to pt... KJ

## 2015-01-10 ENCOUNTER — Other Ambulatory Visit: Payer: Self-pay | Admitting: Oncology

## 2015-01-26 ENCOUNTER — Encounter: Payer: Self-pay | Admitting: *Deleted

## 2015-01-26 ENCOUNTER — Ambulatory Visit (HOSPITAL_BASED_OUTPATIENT_CLINIC_OR_DEPARTMENT_OTHER): Payer: BLUE CROSS/BLUE SHIELD | Admitting: Oncology

## 2015-01-26 ENCOUNTER — Telehealth: Payer: Self-pay | Admitting: Oncology

## 2015-01-26 ENCOUNTER — Other Ambulatory Visit (HOSPITAL_BASED_OUTPATIENT_CLINIC_OR_DEPARTMENT_OTHER): Payer: BLUE CROSS/BLUE SHIELD

## 2015-01-26 VITALS — BP 126/67 | HR 86 | Temp 97.7°F | Resp 18 | Ht 66.0 in | Wt 167.2 lb

## 2015-01-26 DIAGNOSIS — E8581 Light chain (AL) amyloidosis: Secondary | ICD-10-CM

## 2015-01-26 DIAGNOSIS — E858 Other amyloidosis: Secondary | ICD-10-CM

## 2015-01-26 DIAGNOSIS — I429 Cardiomyopathy, unspecified: Secondary | ICD-10-CM

## 2015-01-26 LAB — CBC WITH DIFFERENTIAL/PLATELET
BASO%: 0.6 % (ref 0.0–2.0)
Basophils Absolute: 0 10*3/uL (ref 0.0–0.1)
EOS%: 2.4 % (ref 0.0–7.0)
Eosinophils Absolute: 0.1 10*3/uL (ref 0.0–0.5)
HEMATOCRIT: 38.9 % (ref 38.4–49.9)
HEMOGLOBIN: 14 g/dL (ref 13.0–17.1)
LYMPH%: 19.9 % (ref 14.0–49.0)
MCH: 33.3 pg (ref 27.2–33.4)
MCHC: 36 g/dL (ref 32.0–36.0)
MCV: 92.6 fL (ref 79.3–98.0)
MONO#: 0.9 10*3/uL (ref 0.1–0.9)
MONO%: 16.1 % — AB (ref 0.0–14.0)
NEUT#: 3.3 10*3/uL (ref 1.5–6.5)
NEUT%: 61 % (ref 39.0–75.0)
Platelets: 119 10*3/uL — ABNORMAL LOW (ref 140–400)
RBC: 4.2 10*6/uL (ref 4.20–5.82)
RDW: 13.1 % (ref 11.0–14.6)
WBC: 5.3 10*3/uL (ref 4.0–10.3)
lymph#: 1.1 10*3/uL (ref 0.9–3.3)

## 2015-01-26 LAB — COMPREHENSIVE METABOLIC PANEL (CC13)
ALK PHOS: 61 U/L (ref 40–150)
ALT: 19 U/L (ref 0–55)
AST: 28 U/L (ref 5–34)
Albumin: 3.7 g/dL (ref 3.5–5.0)
Anion Gap: 7 mEq/L (ref 3–11)
BUN: 20.3 mg/dL (ref 7.0–26.0)
CALCIUM: 8.9 mg/dL (ref 8.4–10.4)
CHLORIDE: 105 meq/L (ref 98–109)
CO2: 28 mEq/L (ref 22–29)
Creatinine: 0.9 mg/dL (ref 0.7–1.3)
EGFR: 88 mL/min/{1.73_m2} — AB (ref >90)
GLUCOSE: 107 mg/dL (ref 70–140)
POTASSIUM: 4.1 meq/L (ref 3.5–5.1)
Sodium: 140 mEq/L (ref 136–145)
Total Bilirubin: 0.67 mg/dL (ref 0.20–1.20)
Total Protein: 6.1 g/dL — ABNORMAL LOW (ref 6.4–8.3)

## 2015-01-26 NOTE — Telephone Encounter (Signed)
Pt confirmed labs/ov per 02/17 POF, gave pt AVS..... KJ

## 2015-01-26 NOTE — Progress Notes (Signed)
  [place] OFFICE PROGRESS NOTE   Diagnosis: Amyloidosis  INTERVAL HISTORY:   Mr. Bonano returns as scheduled. He feels well. No leg edema. No new complaint. An echocardiogram 09/07/2014 found the left ventricular ejection fraction at 65-70%. Severe left ventricular hypertrophy. He was seen in the transplant clinic at [facility] in December. Continued observation was recommended.  Objective:  Vital signs in last 24 hours:  Blood pressure 126/67, pulse 86, temperature 97.7 F (36.5 C), resp. rate 18, height 5\' 6"  (1.676 m), weight 167 lb 3.2 oz (75.841 kg), SpO2 98 %.    HEENT: Oral cavity without bleeding, no macroglossia Resp: Lungs clear bilaterally Cardio: Regular rate and rhythm GI: No hepatosplenomegaly Vascular: No leg edema   Lab Results:  Lab Results  Component Value Date   WBC 5.3 01/26/2015   HGB 14.0 01/26/2015   HCT 38.9 01/26/2015   MCV 92.6 01/26/2015   PLT 119* 01/26/2015   NEUTROABS 3.3 01/26/2015   creatinine 0.9, potassium 4.1, total protein 6.1, albumin 3.7  11 22015-24 urine total protein 990 mg (2400 mg 05/04/2014) sees   Serum free light chains and 24-hour urine pending today   Medications: I have reviewed the patient's current medications.  Assessment/Plan: 1. 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 Followed by Dr. Mare Ferrari, echocardiogram September 2015 with an LVEF of 65-70% and severe LVH    Disposition:  There is no clinical evidence for progression of the amyloidosis. We will follow-up on the serum free light chains and 24 urine protein excretion from today. He will return for an office and lab visit in 3 months. The plan  is to proceed with high-dose chemotherapy and stem cell support when there is clinical evidence of disease progression.  He will discontinue acyclovir as he last received Velcade in April 2015.  Betsy Coder, MD  01/26/2015  1:37 PM

## 2015-01-27 ENCOUNTER — Ambulatory Visit: Payer: BC Managed Care – PPO | Admitting: Oncology

## 2015-01-27 ENCOUNTER — Other Ambulatory Visit: Payer: BC Managed Care – PPO

## 2015-01-27 LAB — KAPPA/LAMBDA LIGHT CHAINS
Kappa free light chain: 1.81 mg/dL (ref 0.33–1.94)
Kappa:Lambda Ratio: 1.09 (ref 0.26–1.65)
Lambda Free Lght Chn: 1.66 mg/dL (ref 0.57–2.63)

## 2015-01-30 LAB — UIFE/LIGHT CHAINS/TP QN, 24-HR UR
Albumin, U: DETECTED
Alpha 1, Urine: DETECTED — AB
Alpha 2, Urine: DETECTED — AB
Beta, Urine: DETECTED — AB
Gamma Globulin, Urine: DETECTED — AB
TOTAL PROTEIN, URINE-UR/DAY: 986 mg/d — AB (ref <150)
Time: 24 hours
Total Protein, Urine: 58 mg/dL — ABNORMAL HIGH (ref 5–25)
Volume, Urine: 1700 mL

## 2015-01-30 LAB — 24 HR URINE,KAPPA/LAMBDA LIGHT CHAINS
24H Urine Volume: 1700 mL/24 h
Measured Kappa Chain: <0.4 mg/dL (ref <2.00)
Measured Lambda Chain: <0.4 mg/dL (ref <2.00)

## 2015-02-07 ENCOUNTER — Other Ambulatory Visit: Payer: Self-pay | Admitting: Oncology

## 2015-04-26 ENCOUNTER — Telehealth: Payer: Self-pay | Admitting: Oncology

## 2015-04-26 ENCOUNTER — Other Ambulatory Visit (HOSPITAL_BASED_OUTPATIENT_CLINIC_OR_DEPARTMENT_OTHER): Payer: PPO

## 2015-04-26 ENCOUNTER — Ambulatory Visit (HOSPITAL_BASED_OUTPATIENT_CLINIC_OR_DEPARTMENT_OTHER): Payer: PPO | Admitting: Oncology

## 2015-04-26 VITALS — BP 130/72 | HR 85 | Temp 98.4°F | Resp 19 | Ht 66.0 in | Wt 165.7 lb

## 2015-04-26 DIAGNOSIS — E8581 Light chain (AL) amyloidosis: Secondary | ICD-10-CM

## 2015-04-26 DIAGNOSIS — E858 Other amyloidosis: Secondary | ICD-10-CM | POA: Diagnosis not present

## 2015-04-26 LAB — COMPREHENSIVE METABOLIC PANEL (CC13)
ALBUMIN: 3.9 g/dL (ref 3.5–5.0)
ALK PHOS: 74 U/L (ref 40–150)
ALT: 24 U/L (ref 0–55)
AST: 31 U/L (ref 5–34)
Anion Gap: 12 mEq/L — ABNORMAL HIGH (ref 3–11)
BUN: 17.6 mg/dL (ref 7.0–26.0)
CHLORIDE: 104 meq/L (ref 98–109)
CO2: 27 mEq/L (ref 22–29)
Calcium: 9 mg/dL (ref 8.4–10.4)
Creatinine: 1 mg/dL (ref 0.7–1.3)
EGFR: 79 mL/min/{1.73_m2} — ABNORMAL LOW (ref >90)
GLUCOSE: 108 mg/dL (ref 70–140)
POTASSIUM: 4.1 meq/L (ref 3.5–5.1)
Sodium: 142 mEq/L (ref 136–145)
TOTAL PROTEIN: 6.6 g/dL (ref 6.4–8.3)
Total Bilirubin: 0.8 mg/dL (ref 0.20–1.20)

## 2015-04-26 LAB — CBC WITH DIFFERENTIAL/PLATELET
BASO%: 0.7 % (ref 0.0–2.0)
Basophils Absolute: 0 10*3/uL (ref 0.0–0.1)
EOS%: 3.5 % (ref 0.0–7.0)
Eosinophils Absolute: 0.2 10*3/uL (ref 0.0–0.5)
HCT: 41.4 % (ref 38.4–49.9)
HGB: 14.4 g/dL (ref 13.0–17.1)
LYMPH#: 1 10*3/uL (ref 0.9–3.3)
LYMPH%: 21.1 % (ref 14.0–49.0)
MCH: 31.8 pg (ref 27.2–33.4)
MCHC: 34.8 g/dL (ref 32.0–36.0)
MCV: 91.4 fL (ref 79.3–98.0)
MONO#: 0.6 10*3/uL (ref 0.1–0.9)
MONO%: 13.9 % (ref 0.0–14.0)
NEUT#: 2.8 10*3/uL (ref 1.5–6.5)
NEUT%: 60.8 % (ref 39.0–75.0)
Platelets: 123 10*3/uL — ABNORMAL LOW (ref 140–400)
RBC: 4.53 10*6/uL (ref 4.20–5.82)
RDW: 13.4 % (ref 11.0–14.6)
WBC: 4.6 10*3/uL (ref 4.0–10.3)

## 2015-04-26 NOTE — Telephone Encounter (Signed)
Pt confirmed labs/ov per 05/17 POF, gave pt AVS and Calendar.... KJ 

## 2015-04-26 NOTE — Progress Notes (Signed)
  [place] OFFICE PROGRESS NOTE   Diagnosis: Amyloidosis  INTERVAL HISTORY:   Eugene Martinez returns as scheduled. He feels well. Good energy level. No pain. No complaint.  Objective:  Vital signs in last 24 hours:  Blood pressure 130/72, pulse 85, temperature 98.4 F (36.9 C), temperature source Oral, resp. rate 19, height 5\' 6"  (1.676 m), weight 165 lb 11.2 oz (75.161 kg), SpO2 98 %.    HEENT: No thrush or ulcers, mild periorbital bruising Resp: Lungs clear bilaterally Cardio: Regular rate and rhythm GI: No hepatosplenomegaly Vascular: No leg edema   Lab Results:  Lab Results  Component Value Date   WBC 4.6 04/26/2015   HGB 14.4 04/26/2015   HCT 41.4 04/26/2015   MCV 91.4 04/26/2015   PLT 123* 04/26/2015   NEUTROABS 2.8 04/26/2015   01/26/2015: Lambda free light chains 1.66, kappa free light chains 1.81, 24 hour urine protein 986  Medications: I have reviewed the patient's current medications.  Assessment/Plan: 1. 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 Followed by Dr. Mare Ferrari, echocardiogram September 2015 with an LVEF of 65-70% and severe LVH  Disposition:  Eugene Martinez appears stable. We will follow-up on the serum light chains and 24-hour urine protein from today. He will return for an office and lab visit in 4 months. He will contact us in the interim for new symptoms.  Betsy Coder, MD  04/26/2015  10:52 AM

## 2015-04-28 LAB — UPEP/TP, 24-HR URINE
ALBUMIN UR 24 HR ELECTRO: 33.7 %
Alpha-1-Globulin, U: 26.3 %
Alpha-2-Globulin, U: 14.9 %
BETA GLOBULIN, U: 6.8 %
Collection Interval: 24 hours
Gamma Globulin, U: 18.3 %
TOTAL PROTEIN, URINE: 8 mg/dL
Total Protein, Urine/Day: 256 mg/d — ABNORMAL HIGH (ref 50–100)
Total Volume, Urine: 3200 mL

## 2015-04-28 LAB — PROTEIN ELECTROPHORESIS, SERUM
ALBUMIN ELP: 4.2 g/dL (ref 3.8–4.8)
ALPHA-1-GLOBULIN: 0.2 g/dL (ref 0.2–0.3)
Alpha-2-Globulin: 0.8 g/dL (ref 0.5–0.9)
Beta 2: 0.3 g/dL (ref 0.2–0.5)
Beta Globulin: 0.3 g/dL — ABNORMAL LOW (ref 0.4–0.6)
GAMMA GLOBULIN: 0.7 g/dL — AB (ref 0.8–1.7)
TOTAL PROTEIN, SERUM ELECTROPHOR: 6.5 g/dL (ref 6.1–8.1)

## 2015-04-28 LAB — KAPPA/LAMBDA LIGHT CHAINS
KAPPA LAMBDA RATIO: 1.28 (ref 0.26–1.65)
Kappa free light chain: 2.22 mg/dL — ABNORMAL HIGH (ref 0.33–1.94)
LAMBDA FREE LGHT CHN: 1.74 mg/dL (ref 0.57–2.63)

## 2015-06-06 ENCOUNTER — Other Ambulatory Visit: Payer: Self-pay

## 2015-08-17 ENCOUNTER — Other Ambulatory Visit: Payer: Self-pay | Admitting: *Deleted

## 2015-08-17 ENCOUNTER — Telehealth: Payer: Self-pay | Admitting: Oncology

## 2015-08-17 NOTE — Telephone Encounter (Signed)
s.w.l ptand advosed on 9.20 cx and moved to 9.27 per pof....pt ok and aware

## 2015-08-19 ENCOUNTER — Ambulatory Visit (INDEPENDENT_AMBULATORY_CARE_PROVIDER_SITE_OTHER): Payer: PPO | Admitting: Cardiology

## 2015-08-19 ENCOUNTER — Encounter: Payer: Self-pay | Admitting: Cardiology

## 2015-08-19 VITALS — BP 122/74 | HR 72 | Ht 66.0 in | Wt 166.8 lb

## 2015-08-19 DIAGNOSIS — E854 Organ-limited amyloidosis: Secondary | ICD-10-CM

## 2015-08-19 DIAGNOSIS — I43 Cardiomyopathy in diseases classified elsewhere: Secondary | ICD-10-CM

## 2015-08-19 DIAGNOSIS — I5032 Chronic diastolic (congestive) heart failure: Secondary | ICD-10-CM

## 2015-08-19 DIAGNOSIS — I4510 Unspecified right bundle-branch block: Secondary | ICD-10-CM

## 2015-08-19 NOTE — Patient Instructions (Signed)
Medication Instructions:  Your physician recommends that you continue on your current medications as directed. Please refer to the Current Medication list given to you today.  Labwork: NONE  Testing/Procedures: NONE  Follow-Up: Your physician wants you to follow-up in: 1 YEAR OV/EKG  You will receive a reminder letter in the mail two months in advance. If you don't receive a letter, please call our office to schedule the follow-up appointment.    

## 2015-08-19 NOTE — Progress Notes (Signed)
Cardiology Office Note   Date:  08/19/2015   ID:  Eugene Martinez, Eugene Martinez Aug 04, 1946, MRN [id number]  PCP:  Tracie Harrier, MD  Cardiologist: Darlin Coco MD  No chief complaint on file.     History of Present Illness: Eugene Martinez is a 69 y.o. male who presents for a one-year follow-up office visit  This pleasant 69 year old gentleman is seen back for a followup office visit. We last saw him in September 2015. He had been previously hospitalized at [place] after experiencing exertional syncope while [place]. Workup in the hospital revealed significant left ventricular hypertrophy and a speckling of the myocardium on two-dimensional echocardiogram suggestive of possible amyloid heart disease. Systolic function was normal with ejection fraction of 55-60% and the left atrium was moderately dilated. The patient had a cardiac MRI looking for amyloid heart disease. Left ventricular wall motion was normal with an ejection fraction of 66%. There appeared to be mild right ventricular hypertrophy and normal right atrial size and mild to moderate mitral regurgitation. When delayed enhancement imaging was attempted, they were unable to null the left ventricular myocardium. The patient was monitored in the hospital and had no arrhythmias. His blood pressure remained low normal and he was discharged on no medication for blood pressure but with the instructions to take Lasix as needed if his weight increased or if he had unusual shortness of breath. 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 Further outpatient workup included a fat pad biopsy which was not diagnostic for myeloma or amyloidosis and a bone marrow biopsy which was not diagnostic. 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 Since I last saw him he has  completed chemotherapy for his amyloidosis and has been in remission since December 2014. He has had previous harvest of stem cells down at [facility] but has not had a stem cell transplant. He has been doing well from a cardiac standpoint. He denies any shortness of breath with activity. He is not experiencing any paroxysmal nocturnal dyspnea. He rarely has any ankle edema. He has not been getting any regular exercise but intends to do more after he retires at the end of the year. 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  He works about 20 hours a week and he can work at his own pace and at his own schedule. He has not required any recent furosemide.  He does keep to a low-salt diet.  He does not get much exercise.  He does wear a leg brace on his right lower leg.  Past Medical History  Diagnosis Date  . Hypertension   . Syncope     a. 10/2012 Echo: EF 55-60%, severe LVH with speckling of myocardium -? amyloid. Mild MR, mod dil LA.  . Mild mitral regurgitation     a. by echo 10/2012    No past surgical history on file.   Current Outpatient Prescriptions  Medication Sig Dispense Refill  . azelastine (ASTELIN) 137 MCG/SPRAY nasal spray Place 1-2 sprays into both nostrils every 12 (twelve) hours as needed (allergies).     . chlorpheniramine-HYDROcodone (TUSSIONEX) 10-8 MG/5ML SUER Take 5 mLs by mouth 2 (two) times daily as needed (cough).   0  . cholecalciferol (VITAMIN D) 1000 UNITS tablet Take 1,000 Units by mouth at bedtime.     . ferrous sulfate 325 (65 FE)  MG tablet Take 325 mg by mouth daily.  3  . finasteride (PROSCAR) 5 MG tablet Take 5 mg by mouth every morning.     Marland Kitchen levocetirizine (XYZAL) 5 MG tablet Take 5 mg by mouth at bedtime. To relieve allergy symptoms    . montelukast (SINGULAIR) 10 MG tablet Take 10 mg by mouth at bedtime.    . Multiple Vitamins-Minerals (CENTRUM SILVER ULTRA MENS) TABS Take 1 tablet by mouth every morning.     . Omega-3 Fatty Acids (FISH OIL) 1200 MG CAPS  Take 1,200 mg by mouth 2 (two) times daily.     Marland Kitchen omeprazole (PRILOSEC) 20 MG capsule Take 20 mg by mouth daily before breakfast. Takes 30 minutes before breakfast for acid reflux    . torsemide (DEMADEX) 20 MG tablet TAKE 1 TABLET BY MOUTH TWO TIMES DAILY. TAKE IF WT. INCREASES 2-5 LBS 60 tablet 0  . triamcinolone cream (KENALOG) 0.1 % Apply 1 application topically daily as needed (skin rash).   2  . vitamin E 400 UNIT capsule Take 400 Units by mouth every morning.      No current facility-administered medications for this visit.    Allergies:   Other    Social History:  The patient  reports that he has never smoked. He has never used smokeless tobacco. He reports that he does not drink alcohol or use illicit drugs.   Family History:  The patient's family history is not on file.    ROS:  Please see the history of present illness.   Otherwise, review of systems are positive for none.   All other systems are reviewed and negative.    PHYSICAL EXAM: VS:  BP 122/74 mmHg  Pulse 72  Ht '5\' 6"'  (1.676 m)  Wt 166 lb 12.8 oz (75.66 kg)  BMI 26.94 kg/m2 , BMI Body mass index is 26.94 kg/(m^2). GEN: Well nourished, well developed, in no acute distress HEENT: normal Neck: no JVD, carotid bruits, or masses Cardiac: RRR; no murmurs, rubs, or gallops,no edema  Respiratory:  clear to auscultation bilaterally, normal work of breathing GI: soft, nontender, nondistended, + BS MS: no deformity or atrophy Skin: warm and dry, no rash Neuro:  Strength and sensation are intact Psych: euthymic mood, full affect   EKG:  EKG is ordered today. The ekg ordered today demonstrates normal sinus rhythm at 72 bpm.  Bifascicular block.  LVH with QRS widening.  Since the prior tracing of 08/31/14 rate is slower.   Recent Labs: 04/26/2015: ALT 24; BUN 17.6; Creatinine 1.0; HGB 14.4; Platelets 123*; Potassium 4.1; Sodium 142    Lipid Panel    Component Value Date/Time   CHOL 209* 10/31/2012 0451   TRIG 118  10/31/2012 0451   HDL 37* 10/31/2012 0451   CHOLHDL 5.6 10/31/2012 0451   VLDL 24 10/31/2012 0451   LDLCALC 148* 10/31/2012 0451      Wt Readings from Last 3 Encounters:  08/19/15 166 lb 12.8 oz (75.66 kg)  04/26/15 165 lb 11.2 oz (75.161 kg)  01/26/15 167 lb 3.2 oz (75.841 kg)        ASSESSMENT AND PLAN:  1. AL amyloid heart muscle disease, currently stable and asymptomatic 2.  Bifascicular block 3.  Remote history of exertional syncope with no recurrence  Current medicines are reviewed at length with the patient today.  The patient does not have concerns regarding medicines.  The following changes have been made:  no change  Labs/ tests ordered today include:  Orders Placed This Encounter  Procedures  . EKG 12-Lead    Disposition: The patient is doing very well on just a low salt diet.  He is not requiring any additional cardiac medications.  He has torsemide on hand if his weight increases by 2-5 pounds. Return here in one year for follow-up office visit and EKG.  Berna Spare MD 08/19/2015 6:29 PM    [address], [place], [place]  [address] Phone: [phone]; Fax: [phone]

## 2015-08-30 ENCOUNTER — Other Ambulatory Visit: Payer: PPO

## 2015-08-30 ENCOUNTER — Ambulatory Visit: Payer: PPO | Admitting: Oncology

## 2015-09-06 ENCOUNTER — Telehealth: Payer: Self-pay | Admitting: Oncology

## 2015-09-06 ENCOUNTER — Ambulatory Visit (HOSPITAL_BASED_OUTPATIENT_CLINIC_OR_DEPARTMENT_OTHER): Payer: PPO | Admitting: Oncology

## 2015-09-06 ENCOUNTER — Other Ambulatory Visit (HOSPITAL_BASED_OUTPATIENT_CLINIC_OR_DEPARTMENT_OTHER): Payer: PPO

## 2015-09-06 VITALS — BP 145/70 | HR 86 | Temp 98.2°F | Resp 18 | Ht 66.0 in | Wt 167.4 lb

## 2015-09-06 DIAGNOSIS — E858 Other amyloidosis: Secondary | ICD-10-CM

## 2015-09-06 DIAGNOSIS — E8581 Light chain (AL) amyloidosis: Secondary | ICD-10-CM

## 2015-09-06 LAB — CBC WITH DIFFERENTIAL/PLATELET
BASO%: 0.7 % (ref 0.0–2.0)
Basophils Absolute: 0 10*3/uL (ref 0.0–0.1)
EOS%: 1.6 % (ref 0.0–7.0)
Eosinophils Absolute: 0.1 10*3/uL (ref 0.0–0.5)
HEMATOCRIT: 41.7 % (ref 38.4–49.9)
HGB: 14.1 g/dL (ref 13.0–17.1)
LYMPH#: 1.3 10*3/uL (ref 0.9–3.3)
LYMPH%: 26.1 % (ref 14.0–49.0)
MCH: 31.5 pg (ref 27.2–33.4)
MCHC: 33.8 g/dL (ref 32.0–36.0)
MCV: 93.3 fL (ref 79.3–98.0)
MONO#: 0.5 10*3/uL (ref 0.1–0.9)
MONO%: 10.3 % (ref 0.0–14.0)
NEUT#: 3.1 10*3/uL (ref 1.5–6.5)
NEUT%: 61.3 % (ref 39.0–75.0)
Platelets: 126 10*3/uL — ABNORMAL LOW (ref 140–400)
RBC: 4.46 10*6/uL (ref 4.20–5.82)
RDW: 14.8 % — ABNORMAL HIGH (ref 11.0–14.6)
WBC: 5.1 10*3/uL (ref 4.0–10.3)

## 2015-09-06 LAB — COMPREHENSIVE METABOLIC PANEL (CC13)
ALT: 22 U/L (ref 0–55)
AST: 25 U/L (ref 5–34)
Albumin: 3.7 g/dL (ref 3.5–5.0)
Alkaline Phosphatase: 61 U/L (ref 40–150)
Anion Gap: 8 mEq/L (ref 3–11)
BUN: 12.8 mg/dL (ref 7.0–26.0)
CALCIUM: 8.9 mg/dL (ref 8.4–10.4)
CHLORIDE: 106 meq/L (ref 98–109)
CO2: 27 meq/L (ref 22–29)
CREATININE: 0.9 mg/dL (ref 0.7–1.3)
EGFR: 87 mL/min/{1.73_m2} — ABNORMAL LOW (ref >90)
Glucose: 115 mg/dl (ref 70–140)
POTASSIUM: 4.6 meq/L (ref 3.5–5.1)
Sodium: 141 mEq/L (ref 136–145)
Total Bilirubin: 0.64 mg/dL (ref 0.20–1.20)
Total Protein: 6.2 g/dL — ABNORMAL LOW (ref 6.4–8.3)

## 2015-09-06 NOTE — Telephone Encounter (Signed)
per pof to sch pt appt-gave pt copy of avs °

## 2015-09-06 NOTE — Progress Notes (Signed)
  [facility] OFFICE PROGRESS NOTE   Diagnosis: Amyloidosis  INTERVAL HISTORY:   Eugene Martinez returns as scheduled. He feels well. No edema.  Objective:  Vital signs in last 24 hours:  Blood pressure 145/70, pulse 86, temperature 98.2 F (36.8 C), temperature source Oral, resp. rate 18, height 5\' 6"  (1.676 m), weight 167 lb 6.4 oz (75.932 kg), SpO2 99 %.    Resp: Lungs clear bilaterally Cardio: Regular rate and rhythm GI: No hepatosplenomegaly Vascular: No leg edema  Skin: Areas of light brown hyperpigmentation at the lower back and left lower anterior chest wall    Lab Results:  Lab Results  Component Value Date   WBC 5.1 09/06/2015   HGB 14.1 09/06/2015   HCT 41.7 09/06/2015   MCV 93.3 09/06/2015   PLT 126* 09/06/2015   NEUTROABS 3.1 09/06/2015   Potassium 4.6, creating 0.9, albumen 3.7   24 urine total protein on 04/26/2015  -256 Serum free kappa light chains on 04/26/2015-2.22  Medications: I have reviewed the patient's current medications.  Assessment/Plan: 1. 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 Followed by Dr. Mare Ferrari, echocardiogram September 2015 with an LVEF of 65-70% and severe LVH   Disposition:  Eugene Martinez appears stable. We will follow-up on the 24-hour urine protein and serum light chains from today. He will return for an office and lab visit in 4 months.  Betsy Coder, MD  09/06/2015  10:40 AM

## 2015-09-07 LAB — KAPPA/LAMBDA LIGHT CHAINS
Kappa free light chain: 1.88 mg/dL (ref 0.33–1.94)
Kappa:Lambda Ratio: 0.59 (ref 0.26–1.65)
Lambda Free Lght Chn: 3.21 mg/dL — ABNORMAL HIGH (ref 0.57–2.63)

## 2015-09-08 LAB — UPEP/TP, 24-HR URINE
ALPHA-1-GLOBULIN, U: 24.2 %
ALPHA-2-GLOBULIN, U: 14.4 %
Albumin: 47.3 %
Beta Globulin, U: 9.3 %
Collection Interval: 24 hours
GAMMA GLOBULIN, U: 4.8 %
TOTAL VOLUME, URINE: 2000 mL
Total Protein, Urine/Day: 320 mg/d — ABNORMAL HIGH (ref 50–100)
Total Protein, Urine: 16 mg/dL

## 2015-11-23 DIAGNOSIS — D696 Thrombocytopenia, unspecified: Secondary | ICD-10-CM | POA: Insufficient documentation

## 2015-12-11 HISTORY — PX: FOOT SURGERY: SHX648

## 2015-12-13 DIAGNOSIS — J3089 Other allergic rhinitis: Secondary | ICD-10-CM | POA: Diagnosis not present

## 2015-12-13 DIAGNOSIS — J301 Allergic rhinitis due to pollen: Secondary | ICD-10-CM | POA: Diagnosis not present

## 2015-12-20 DIAGNOSIS — J3089 Other allergic rhinitis: Secondary | ICD-10-CM | POA: Diagnosis not present

## 2015-12-20 DIAGNOSIS — J301 Allergic rhinitis due to pollen: Secondary | ICD-10-CM | POA: Diagnosis not present

## 2015-12-29 DIAGNOSIS — J3089 Other allergic rhinitis: Secondary | ICD-10-CM | POA: Diagnosis not present

## 2015-12-29 DIAGNOSIS — J301 Allergic rhinitis due to pollen: Secondary | ICD-10-CM | POA: Diagnosis not present

## 2016-01-05 DIAGNOSIS — J301 Allergic rhinitis due to pollen: Secondary | ICD-10-CM | POA: Diagnosis not present

## 2016-01-05 DIAGNOSIS — J3089 Other allergic rhinitis: Secondary | ICD-10-CM | POA: Diagnosis not present

## 2016-01-09 ENCOUNTER — Ambulatory Visit (HOSPITAL_BASED_OUTPATIENT_CLINIC_OR_DEPARTMENT_OTHER): Payer: PPO | Admitting: Oncology

## 2016-01-09 ENCOUNTER — Telehealth: Payer: Self-pay | Admitting: Oncology

## 2016-01-09 ENCOUNTER — Other Ambulatory Visit (HOSPITAL_BASED_OUTPATIENT_CLINIC_OR_DEPARTMENT_OTHER): Payer: PPO

## 2016-01-09 VITALS — BP 141/67 | HR 79 | Temp 98.3°F | Resp 18 | Ht 66.0 in | Wt 170.7 lb

## 2016-01-09 DIAGNOSIS — I428 Other cardiomyopathies: Secondary | ICD-10-CM

## 2016-01-09 DIAGNOSIS — D696 Thrombocytopenia, unspecified: Secondary | ICD-10-CM | POA: Diagnosis not present

## 2016-01-09 DIAGNOSIS — E858 Other amyloidosis: Secondary | ICD-10-CM

## 2016-01-09 DIAGNOSIS — E8581 Light chain (AL) amyloidosis: Secondary | ICD-10-CM

## 2016-01-09 LAB — COMPREHENSIVE METABOLIC PANEL
ALT: 23 U/L (ref 0–55)
ANION GAP: 9 meq/L (ref 3–11)
AST: 26 U/L (ref 5–34)
Albumin: 3.8 g/dL (ref 3.5–5.0)
Alkaline Phosphatase: 71 U/L (ref 40–150)
BUN: 18.5 mg/dL (ref 7.0–26.0)
CHLORIDE: 107 meq/L (ref 98–109)
CO2: 25 meq/L (ref 22–29)
CREATININE: 0.9 mg/dL (ref 0.7–1.3)
Calcium: 9.1 mg/dL (ref 8.4–10.4)
EGFR: 85 mL/min/{1.73_m2} — ABNORMAL LOW (ref >90)
GLUCOSE: 96 mg/dL (ref 70–140)
Potassium: 4.4 mEq/L (ref 3.5–5.1)
SODIUM: 140 meq/L (ref 136–145)
Total Bilirubin: 0.75 mg/dL (ref 0.20–1.20)
Total Protein: 6.7 g/dL (ref 6.4–8.3)

## 2016-01-09 LAB — CBC WITH DIFFERENTIAL/PLATELET
BASO%: 0.7 % (ref 0.0–2.0)
Basophils Absolute: 0 10*3/uL (ref 0.0–0.1)
EOS%: 3 % (ref 0.0–7.0)
Eosinophils Absolute: 0.2 10*3/uL (ref 0.0–0.5)
HCT: 38.4 % (ref 38.4–49.9)
HGB: 13.7 g/dL (ref 13.0–17.1)
LYMPH#: 1.7 10*3/uL (ref 0.9–3.3)
LYMPH%: 27.4 % (ref 14.0–49.0)
MCH: 31.9 pg (ref 27.2–33.4)
MCHC: 35.7 g/dL (ref 32.0–36.0)
MCV: 89.3 fL (ref 79.3–98.0)
MONO#: 1 10*3/uL — ABNORMAL HIGH (ref 0.1–0.9)
MONO%: 15.9 % — AB (ref 0.0–14.0)
NEUT%: 53 % (ref 39.0–75.0)
NEUTROS ABS: 3.2 10*3/uL (ref 1.5–6.5)
NRBC: 0 % (ref 0–0)
Platelets: 71 10*3/uL — ABNORMAL LOW (ref 140–400)
RBC: 4.3 10*6/uL (ref 4.20–5.82)
RDW: 13.5 % (ref 11.0–14.6)
WBC: 6.1 10*3/uL (ref 4.0–10.3)

## 2016-01-09 NOTE — Progress Notes (Signed)
  [place] OFFICE PROGRESS NOTE   Diagnosis: Amyloidosis  INTERVAL HISTORY:   Mr. Verdejo returns as scheduled. He reports being diagnosed with "shingles "in December. He completed treatment with Valtrex. There was associated pruritus. This has resolved. Mr. Zaremba has noted easy bruising for the past few months. No other bleeding.  When he was seen at the [facility] clinic on 11/16/2015 the platelet count returned at 65,000.  Objective:  Vital signs in last 24 hours:  Blood pressure 141/67, pulse 79, temperature 98.3 F (36.8 C), temperature source Oral, resp. rate 18, height '5\' 6"'$  (1.676 m), weight 170 lb 11.2 oz (77.429 kg), SpO2 97 %.    HEENT: Oral cavity without bleeding Resp: Lungs clear bilaterally Cardio: Regular rate and rhythm GI: No hepatosplenomegaly, nontender Vascular: No leg edema  Skin: Healed zoster rash at the left lateral chest and back    Lab Results:  Lab Results  Component Value Date   WBC 6.1 01/09/2016   HGB 13.7 01/09/2016   HCT 38.4 01/09/2016   MCV 89.3 01/09/2016   PLT 71* 01/09/2016   NEUTROABS 3.2 01/09/2016    Medications: I have reviewed the patient's current medications.  Assessment/Plan: 1. 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 Followed by Dr. Mare Ferrari, echocardiogram September 2015 with an LVEF of 65-70% and severe LVH 4. 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  Zoster rash December 2016    Disposition:  Mr. Apollo appears well, but he has developed moderate thrombocytopenia. He is scheduled for a CBC in [place] 2 weeks.  I asked him to have Dr. Ginette Pitman forward me the report. Mr. Flanagin will  contact us for spontaneous bleeding or bruising. He will return for a CBC in 2 months and an office visit in 4 months.  We will follow-up on the light chains and 24-hour urine protein excretion from today. We will consider scheduling a bone marrow biopsy if the platelet count falls further or he develops progressive proteinuria.  Betsy Coder, MD  01/09/2016  1:17 PM

## 2016-01-09 NOTE — Telephone Encounter (Signed)
Pt confirmed labs/ov per 01/30 POF, gave pt AVS and Calendar... KJ °

## 2016-01-10 LAB — UPEP/TP, 24-HR URINE
ALBUMIN, U: 54.1 %
Alpha-1-Globulin, U: 4.9 %
Alpha-2-Globulin, U: 11.1 %
BETA GLOBULIN, U: 22.2 %
GAMMA GLOBULIN, U: 7.7 %
PROTEIN,TOTAL,URINE: 21.7 mg/dL
Prot,24hr calculated: 238.7 mg/24 hr — ABNORMAL HIGH (ref 30.0–150.0)

## 2016-01-10 LAB — KAPPA/LAMBDA LIGHT CHAINS
IG KAPPA FREE LIGHT CHAIN: 21.03 mg/L — AB (ref 3.30–19.40)
IG LAMBDA FREE LIGHT CHAIN: 20.35 mg/L (ref 5.71–26.30)
Kappa/Lambda FluidC Ratio: 1.03 (ref 0.26–1.65)

## 2016-01-10 LAB — MULTIPLE MYELOMA PANEL, SERUM
Albumin SerPl Elph-Mcnc: 3.6 g/dL (ref 2.9–4.4)
Albumin/Glob SerPl: 1.4 (ref 0.7–1.7)
Alpha 1: 0.2 g/dL (ref 0.0–0.4)
Alpha2 Glob SerPl Elph-Mcnc: 0.8 g/dL (ref 0.4–1.0)
B-GLOBULIN SERPL ELPH-MCNC: 1 g/dL (ref 0.7–1.3)
GAMMA GLOB SERPL ELPH-MCNC: 0.8 g/dL (ref 0.4–1.8)
GLOBULIN, TOTAL: 2.7 g/dL (ref 2.2–3.9)
IGA/IMMUNOGLOBULIN A, SERUM: 171 mg/dL (ref 61–437)
IgG, Qn, Serum: 709 mg/dL (ref 700–1600)
IgM, Qn, Serum: 40 mg/dL (ref 15–143)
Total Protein: 6.3 g/dL (ref 6.0–8.5)

## 2016-01-11 ENCOUNTER — Telehealth: Payer: Self-pay | Admitting: *Deleted

## 2016-01-11 NOTE — Telephone Encounter (Signed)
Return call received from patient for lab results.  Results provide as requested by Dr. Benay Spice.  No further questions.

## 2016-01-11 NOTE — Telephone Encounter (Signed)
-----   Message from Ladell Pier, MD sent at 01/10/2016  7:37 PM EST ----- Please call patient, urine protein is stable, f/u as scheduled

## 2016-01-12 DIAGNOSIS — J301 Allergic rhinitis due to pollen: Secondary | ICD-10-CM | POA: Diagnosis not present

## 2016-01-12 DIAGNOSIS — J3089 Other allergic rhinitis: Secondary | ICD-10-CM | POA: Diagnosis not present

## 2016-01-17 DIAGNOSIS — J301 Allergic rhinitis due to pollen: Secondary | ICD-10-CM | POA: Diagnosis not present

## 2016-01-17 DIAGNOSIS — J3089 Other allergic rhinitis: Secondary | ICD-10-CM | POA: Diagnosis not present

## 2016-01-24 DIAGNOSIS — D696 Thrombocytopenia, unspecified: Secondary | ICD-10-CM | POA: Diagnosis not present

## 2016-01-24 DIAGNOSIS — J301 Allergic rhinitis due to pollen: Secondary | ICD-10-CM | POA: Diagnosis not present

## 2016-01-24 DIAGNOSIS — J3089 Other allergic rhinitis: Secondary | ICD-10-CM | POA: Diagnosis not present

## 2016-01-25 DIAGNOSIS — J019 Acute sinusitis, unspecified: Secondary | ICD-10-CM | POA: Diagnosis not present

## 2016-01-25 DIAGNOSIS — J4520 Mild intermittent asthma, uncomplicated: Secondary | ICD-10-CM | POA: Diagnosis not present

## 2016-01-25 DIAGNOSIS — J40 Bronchitis, not specified as acute or chronic: Secondary | ICD-10-CM | POA: Diagnosis not present

## 2016-02-02 DIAGNOSIS — J3089 Other allergic rhinitis: Secondary | ICD-10-CM | POA: Diagnosis not present

## 2016-02-02 DIAGNOSIS — J301 Allergic rhinitis due to pollen: Secondary | ICD-10-CM | POA: Diagnosis not present

## 2016-02-02 DIAGNOSIS — K219 Gastro-esophageal reflux disease without esophagitis: Secondary | ICD-10-CM | POA: Diagnosis not present

## 2016-02-09 DIAGNOSIS — J301 Allergic rhinitis due to pollen: Secondary | ICD-10-CM | POA: Diagnosis not present

## 2016-02-09 DIAGNOSIS — J3089 Other allergic rhinitis: Secondary | ICD-10-CM | POA: Diagnosis not present

## 2016-02-14 DIAGNOSIS — J301 Allergic rhinitis due to pollen: Secondary | ICD-10-CM | POA: Diagnosis not present

## 2016-02-14 DIAGNOSIS — J3089 Other allergic rhinitis: Secondary | ICD-10-CM | POA: Diagnosis not present

## 2016-02-20 ENCOUNTER — Ambulatory Visit (INDEPENDENT_AMBULATORY_CARE_PROVIDER_SITE_OTHER): Payer: PPO

## 2016-02-20 ENCOUNTER — Ambulatory Visit (INDEPENDENT_AMBULATORY_CARE_PROVIDER_SITE_OTHER): Payer: PPO | Admitting: Podiatry

## 2016-02-20 ENCOUNTER — Encounter: Payer: Self-pay | Admitting: Podiatry

## 2016-02-20 VITALS — BP 130/72 | HR 93 | Resp 16

## 2016-02-20 DIAGNOSIS — E859 Amyloidosis, unspecified: Secondary | ICD-10-CM | POA: Insufficient documentation

## 2016-02-20 DIAGNOSIS — M2041 Other hammer toe(s) (acquired), right foot: Secondary | ICD-10-CM

## 2016-02-20 DIAGNOSIS — M79676 Pain in unspecified toe(s): Secondary | ICD-10-CM | POA: Diagnosis not present

## 2016-02-20 DIAGNOSIS — K219 Gastro-esophageal reflux disease without esophagitis: Secondary | ICD-10-CM | POA: Insufficient documentation

## 2016-02-20 DIAGNOSIS — B351 Tinea unguium: Secondary | ICD-10-CM

## 2016-02-20 DIAGNOSIS — T7840XA Allergy, unspecified, initial encounter: Secondary | ICD-10-CM | POA: Insufficient documentation

## 2016-02-20 NOTE — Progress Notes (Signed)
He presents today with chief complaint of painful thick mycotic nails bilateral. He's also complaining of a painful third digit to his right foot. He states the doctor Regal perform surgery to his second digit of his right foot several years ago and he did very well with that. He goes on to explain that he currently has amyloidosis which is being treated by hematology and oncology and has had chemotherapy. He states that he has pain to the tip of the third digit right foot where the toe turns down. He states that he would like to consider surgical correction of the third toe. We discussed his past medical history medications allergy surgery social history review of systems and his current state of his cancer.  Objective: Vital signs stable alert and oriented 3 pulses are palpable. Neurologic sensorium appears to be intact. Deep tendon reflexes are intact and muscle strength is normal left side. Weak on the right from polio. 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 His toenails are thick yellow dystrophic onychomycotic and painful on palpation. No open lesions or wounds.  Assessment: Pain in limb secondary to rigid mallet toe deformity third right and painful mycotic thick nails.  Plan: Debrided the nails safely 1 through 5 bilateral. We also over a consent form today for repair of third digit right foot. We consented him for a mallet toe repair hammertoe repair possible pin or screw. I answered these questions to best of my ability in layman's terms. He understood it was amenable to a signed all 3 pages of the consent form. We did discuss the possible complications which may include but are not limited to postop pain bleeding swelling infection recurrence need for further surgery loss of digit loss of limb loss of life. I encouraged him to follow up with [place] for scheduling  once he has completed his next visit scheduled for April with his hematologist and oncologist. I encouraged him to discuss this surgery with the follow-up with me.

## 2016-02-20 NOTE — Patient Instructions (Signed)
Pre-Operative Instructions  Congratulations, you have decided to take an important step to improving your quality of life.  You can be assured that the doctors of [facility] will be with you every step of the way.  1. Plan to be at the surgery center/hospital at least 1 (one) hour prior to your scheduled time unless otherwise directed by the surgical center/hospital staff.  You must have a responsible adult accompany you, remain during the surgery and drive you home.  Make sure you have directions to the surgical center/hospital and know how to get there on time. 2. For hospital based surgery you will need to obtain a history and physical form from your family physician within 1 month prior to the date of surgery- we will give you a form for you primary physician.  3. We make every effort to accommodate the date you request for surgery.  There are however, times where surgery dates or times have to be moved.  We will contact you as soon as possible if a change in schedule is required.   4. No Aspirin/Ibuprofen for one week before surgery.  If you are on aspirin, any non-steroidal anti-inflammatory medications (Mobic, Aleve, Ibuprofen) you should stop taking it 7 days prior to your surgery.  You make take Tylenol  For pain prior to surgery.  5. Medications- If you are taking daily heart and blood pressure medications, seizure, reflux, allergy, asthma, anxiety, pain or diabetes medications, make sure the surgery center/hospital is aware before the day of surgery so they may notify you which medications to take or avoid the day of surgery. 6. No food or drink after midnight the night before surgery unless directed otherwise by surgical center/hospital staff. 7. No alcoholic beverages 24 hours prior to surgery.  No smoking 24 hours prior to or 24 hours after surgery. 8. Wear loose pants or shorts- loose enough to fit over bandages, boots, and casts. 9. No slip on shoes, sneakers are best. 10. Bring  your boot with you to the surgery center/hospital.  Also bring crutches or a walker if your physician has prescribed it for you.  If you do not have this equipment, it will be provided for you after surgery. 11. If you have not been contracted by the surgery center/hospital by the day before your surgery, call to confirm the date and time of your surgery. 12. Leave-time from work may vary depending on the type of surgery you have.  Appropriate arrangements should be made prior to surgery with your employer. 13. Prescriptions will be provided immediately following surgery by your doctor.  Have these filled as soon as possible after surgery and take the medication as directed. 14. Remove nail polish on the operative foot. 15. Wash the night before surgery.  The night before surgery wash the foot and leg well with the antibacterial soap provided and water paying special attention to beneath the toenails and in between the toes.  Rinse thoroughly with water and dry well with a towel.  Perform this wash unless told not to do so by your physician.  Enclosed: 1 Ice pack (please put in freezer the night before surgery)   1 Hibiclens skin cleaner   Pre-op Instructions  If you have any questions regarding the instructions, do not hesitate to call our office.  [place]: [address] , [place] [address] [phone]  [place]: [address] , [place] [address] [phone]  [place]: [address]  [place],  [address] [phone]  Dr. Richard   Tuchman DPM, Dr. Norman Regal DPM Dr. Richard Sikora DPM, Dr. M. Todd Rockne Dearinger DPM, Dr. Kathryn Egerton DPM 

## 2016-02-21 ENCOUNTER — Encounter: Payer: Self-pay | Admitting: Oncology

## 2016-02-21 DIAGNOSIS — J3089 Other allergic rhinitis: Secondary | ICD-10-CM | POA: Diagnosis not present

## 2016-02-21 DIAGNOSIS — J301 Allergic rhinitis due to pollen: Secondary | ICD-10-CM | POA: Diagnosis not present

## 2016-02-22 ENCOUNTER — Encounter: Payer: Self-pay | Admitting: Oncology

## 2016-02-22 ENCOUNTER — Telehealth: Payer: Self-pay | Admitting: *Deleted

## 2016-02-22 NOTE — Telephone Encounter (Signed)
I'm calling to see if you want to schedule your surgery with Dr. Milinda Pointer.  "I want to go ahead and have it done.  I spoke to Dr. Milinda Pointer yesterday and he wants me to speak with my Oncologist first and see if I'm healed enough to have surgery.  I think I am but I sent him a message yesterday asking.  I haven't heard anything yet.  I'll let you know what he says but I'd like to go ahead and schedule for April 21 if that's possible."  I'll go ahead and schedule it.  Can you have your Oncologist write [place] a brief message letting us know if you are cleared or not?  "That's what I planned on doing.  If I don't get a response soon, I'll call him."

## 2016-03-01 DIAGNOSIS — J301 Allergic rhinitis due to pollen: Secondary | ICD-10-CM | POA: Diagnosis not present

## 2016-03-01 DIAGNOSIS — J3089 Other allergic rhinitis: Secondary | ICD-10-CM | POA: Diagnosis not present

## 2016-03-02 ENCOUNTER — Telehealth: Payer: Self-pay

## 2016-03-02 NOTE — Telephone Encounter (Signed)
Pt got a call from this number with no message. Called pt back and LVM that the call was probably the automated system for upcoming appts. 3/28 at 2 pm.

## 2016-03-06 ENCOUNTER — Other Ambulatory Visit (HOSPITAL_BASED_OUTPATIENT_CLINIC_OR_DEPARTMENT_OTHER): Payer: PPO

## 2016-03-06 DIAGNOSIS — E8581 Light chain (AL) amyloidosis: Secondary | ICD-10-CM

## 2016-03-06 DIAGNOSIS — E858 Other amyloidosis: Secondary | ICD-10-CM

## 2016-03-06 LAB — CBC WITH DIFFERENTIAL/PLATELET
BASO%: 0.7 % (ref 0.0–2.0)
Basophils Absolute: 0 10*3/uL (ref 0.0–0.1)
EOS%: 3.1 % (ref 0.0–7.0)
Eosinophils Absolute: 0.2 10*3/uL (ref 0.0–0.5)
HCT: 40.7 % (ref 38.4–49.9)
HGB: 13.7 g/dL (ref 13.0–17.1)
LYMPH%: 24.7 % (ref 14.0–49.0)
MCH: 30.6 pg (ref 27.2–33.4)
MCHC: 33.6 g/dL (ref 32.0–36.0)
MCV: 91.1 fL (ref 79.3–98.0)
MONO#: 0.7 10*3/uL (ref 0.1–0.9)
MONO%: 11.7 % (ref 0.0–14.0)
NEUT%: 59.8 % (ref 39.0–75.0)
NEUTROS ABS: 3.6 10*3/uL (ref 1.5–6.5)
PLATELETS: 82 10*3/uL — AB (ref 140–400)
RBC: 4.47 10*6/uL (ref 4.20–5.82)
RDW: 13.8 % (ref 11.0–14.6)
WBC: 6 10*3/uL (ref 4.0–10.3)
lymph#: 1.5 10*3/uL (ref 0.9–3.3)

## 2016-03-07 LAB — UPEP/TP, 24-HR URINE
ALBUMIN, U: 100 %
Alpha-1-Globulin, U: 0 %
Alpha-2-Globulin, U: 0 %
Beta Globulin, U: 0 %
GAMMA GLOBULIN, U: 0 %
PROTEIN,TOTAL,URINE: 4.9 mg/dL
Prot,24hr calculated: 109 mg/24 hr (ref 30.0–150.0)

## 2016-03-08 DIAGNOSIS — J3089 Other allergic rhinitis: Secondary | ICD-10-CM | POA: Diagnosis not present

## 2016-03-08 DIAGNOSIS — J301 Allergic rhinitis due to pollen: Secondary | ICD-10-CM | POA: Diagnosis not present

## 2016-03-09 ENCOUNTER — Telehealth: Payer: Self-pay | Admitting: *Deleted

## 2016-03-09 ENCOUNTER — Other Ambulatory Visit: Payer: Self-pay | Admitting: *Deleted

## 2016-03-09 NOTE — Telephone Encounter (Signed)
Left message with pt to notify him that future 24 hr urines can not be dropped off at [facility] per our lab d/t different systems.

## 2016-03-13 ENCOUNTER — Encounter: Payer: Self-pay | Admitting: Oncology

## 2016-03-15 DIAGNOSIS — J301 Allergic rhinitis due to pollen: Secondary | ICD-10-CM | POA: Diagnosis not present

## 2016-03-15 DIAGNOSIS — J3089 Other allergic rhinitis: Secondary | ICD-10-CM | POA: Diagnosis not present

## 2016-03-20 DIAGNOSIS — J301 Allergic rhinitis due to pollen: Secondary | ICD-10-CM | POA: Diagnosis not present

## 2016-03-20 DIAGNOSIS — J3089 Other allergic rhinitis: Secondary | ICD-10-CM | POA: Diagnosis not present

## 2016-03-22 ENCOUNTER — Encounter: Payer: Self-pay | Admitting: *Deleted

## 2016-03-22 NOTE — Progress Notes (Signed)
Letter mailed to Dr. Lanelle Bal per patient request.

## 2016-03-27 DIAGNOSIS — J3089 Other allergic rhinitis: Secondary | ICD-10-CM | POA: Diagnosis not present

## 2016-03-27 DIAGNOSIS — J301 Allergic rhinitis due to pollen: Secondary | ICD-10-CM | POA: Diagnosis not present

## 2016-03-29 ENCOUNTER — Other Ambulatory Visit: Payer: Self-pay | Admitting: Podiatry

## 2016-03-29 DIAGNOSIS — G14 Postpolio syndrome: Secondary | ICD-10-CM | POA: Diagnosis not present

## 2016-03-29 DIAGNOSIS — T7840XD Allergy, unspecified, subsequent encounter: Secondary | ICD-10-CM | POA: Diagnosis not present

## 2016-03-29 DIAGNOSIS — E858 Other amyloidosis: Secondary | ICD-10-CM | POA: Diagnosis not present

## 2016-03-29 DIAGNOSIS — J309 Allergic rhinitis, unspecified: Secondary | ICD-10-CM | POA: Diagnosis not present

## 2016-03-29 DIAGNOSIS — N400 Enlarged prostate without lower urinary tract symptoms: Secondary | ICD-10-CM | POA: Diagnosis not present

## 2016-03-29 DIAGNOSIS — D696 Thrombocytopenia, unspecified: Secondary | ICD-10-CM | POA: Diagnosis not present

## 2016-03-29 MED ORDER — OXYCODONE-ACETAMINOPHEN 10-325 MG PO TABS: 1.0000 | ORAL_TABLET | Freq: Four times a day (QID) | ORAL | Status: DC | PRN

## 2016-03-29 MED ORDER — CEPHALEXIN 500 MG PO CAPS: 500.0000 mg | ORAL_CAPSULE | Freq: Three times a day (TID) | ORAL | Status: DC

## 2016-03-29 MED ORDER — ONDANSETRON HCL 4 MG PO TABS: 4.0000 mg | ORAL_TABLET | Freq: Three times a day (TID) | ORAL | Status: DC | PRN

## 2016-03-30 ENCOUNTER — Encounter: Payer: Self-pay | Admitting: Podiatry

## 2016-03-30 DIAGNOSIS — T7840XA Allergy, unspecified, initial encounter: Secondary | ICD-10-CM | POA: Diagnosis not present

## 2016-03-30 DIAGNOSIS — M2041 Other hammer toe(s) (acquired), right foot: Secondary | ICD-10-CM | POA: Diagnosis not present

## 2016-04-02 ENCOUNTER — Telehealth: Payer: Self-pay | Admitting: *Deleted

## 2016-04-02 NOTE — Telephone Encounter (Signed)
Post op courtesy call-Pt states he's doing great, not having any problems or swelling.  I instructed pt to continue RICE, not to be on the foot more than 5 minutes/hour, to remain in the boot at all times, and to leave the original dressing in place until it is changed at the 1st POV. Pt states understanding.

## 2016-04-02 NOTE — Telephone Encounter (Signed)
That is fine 

## 2016-04-02 NOTE — Telephone Encounter (Signed)
I called patient to see if he needed the Ondansetron HCL for nausea and vomiting post operatively.  It needs prior authorization.  "I do not need it.  No need to get it."  I informed him I will not start the process of getting it authorized.  I faxed the information to the pharmacy that patient doesn't need it.

## 2016-04-03 NOTE — Progress Notes (Signed)
DOS 03/30/2016 Hammer toe/mallet toe repair 3rd toe right foot.

## 2016-04-04 ENCOUNTER — Ambulatory Visit (INDEPENDENT_AMBULATORY_CARE_PROVIDER_SITE_OTHER): Payer: PPO

## 2016-04-04 ENCOUNTER — Encounter: Payer: Self-pay | Admitting: Podiatry

## 2016-04-04 ENCOUNTER — Ambulatory Visit (INDEPENDENT_AMBULATORY_CARE_PROVIDER_SITE_OTHER): Payer: PPO | Admitting: Podiatry

## 2016-04-04 VITALS — BP 142/81 | HR 96 | Resp 18

## 2016-04-04 DIAGNOSIS — Z9889 Other specified postprocedural states: Secondary | ICD-10-CM

## 2016-04-04 DIAGNOSIS — M2041 Other hammer toe(s) (acquired), right foot: Secondary | ICD-10-CM

## 2016-04-04 NOTE — Progress Notes (Signed)
He presents today for his first postop visit date of surgery 03/30/2016. Distal arthroplasty third digit right foot states that he's been doing very well.  Objective: Vital signs are stable he is alert and oriented 3 dry sterile dressing intact was removed demonstrates distal arthroplasty with sutures are intact minimal edema no cellulitis drainage or odor. Radiographs confirm distal arthroplasties complete no signs of infection today.  Assessment: Well-healing surgical foot distal arthroplasty third digit right foot well-healing.  Plan: Redressed today dry sterile compressive dressing continue to ice and elevate stay off the foot as much as possible keep it dry and clean follow-up with him in 1 week for suture removal if applicable.

## 2016-04-05 DIAGNOSIS — J3089 Other allergic rhinitis: Secondary | ICD-10-CM | POA: Diagnosis not present

## 2016-04-05 DIAGNOSIS — K219 Gastro-esophageal reflux disease without esophagitis: Secondary | ICD-10-CM | POA: Diagnosis not present

## 2016-04-05 DIAGNOSIS — G14 Postpolio syndrome: Secondary | ICD-10-CM | POA: Diagnosis not present

## 2016-04-05 DIAGNOSIS — J301 Allergic rhinitis due to pollen: Secondary | ICD-10-CM | POA: Diagnosis not present

## 2016-04-05 DIAGNOSIS — Z0000 Encounter for general adult medical examination without abnormal findings: Secondary | ICD-10-CM | POA: Diagnosis not present

## 2016-04-05 DIAGNOSIS — T7840XD Allergy, unspecified, subsequent encounter: Secondary | ICD-10-CM | POA: Diagnosis not present

## 2016-04-05 DIAGNOSIS — E858 Other amyloidosis: Secondary | ICD-10-CM | POA: Diagnosis not present

## 2016-04-05 DIAGNOSIS — D696 Thrombocytopenia, unspecified: Secondary | ICD-10-CM | POA: Diagnosis not present

## 2016-04-10 DIAGNOSIS — J3089 Other allergic rhinitis: Secondary | ICD-10-CM | POA: Diagnosis not present

## 2016-04-10 DIAGNOSIS — J301 Allergic rhinitis due to pollen: Secondary | ICD-10-CM | POA: Diagnosis not present

## 2016-04-11 ENCOUNTER — Ambulatory Visit (INDEPENDENT_AMBULATORY_CARE_PROVIDER_SITE_OTHER): Payer: PPO | Admitting: Podiatry

## 2016-04-11 ENCOUNTER — Encounter: Payer: Self-pay | Admitting: Podiatry

## 2016-04-11 VITALS — BP 126/73 | HR 95 | Resp 18

## 2016-04-11 DIAGNOSIS — Z9889 Other specified postprocedural states: Secondary | ICD-10-CM

## 2016-04-11 DIAGNOSIS — M2041 Other hammer toe(s) (acquired), right foot: Secondary | ICD-10-CM

## 2016-04-11 NOTE — Progress Notes (Signed)
He presents today 2 weeks status post DIPJ arthroplasty third digit right foot. He denies fever chills nausea and vomiting states that his toe feels great.  Objective: Vital signs are stable alert and oriented 3 pulses are intact margins are well coapted sutures are intact. Minimal edema. No signs of bacterial infection.  Assessment: Well-healing surgical toe third right.  Plan: Sutures were removed today I will allow him to start washing this tomorrow. He will also continue to wrap the third and second toes because they are syndactyl lysed to help prevent swelling. He will continue the Darco shoe for the next week or so and then progress to a tennis shoe. I will follow-up with him in 2 weeks

## 2016-04-17 DIAGNOSIS — J301 Allergic rhinitis due to pollen: Secondary | ICD-10-CM | POA: Diagnosis not present

## 2016-04-17 DIAGNOSIS — J3089 Other allergic rhinitis: Secondary | ICD-10-CM | POA: Diagnosis not present

## 2016-04-20 DIAGNOSIS — E858 Other amyloidosis: Secondary | ICD-10-CM | POA: Diagnosis not present

## 2016-04-20 DIAGNOSIS — K219 Gastro-esophageal reflux disease without esophagitis: Secondary | ICD-10-CM | POA: Diagnosis not present

## 2016-04-20 DIAGNOSIS — D696 Thrombocytopenia, unspecified: Secondary | ICD-10-CM | POA: Diagnosis not present

## 2016-04-20 DIAGNOSIS — B9689 Other specified bacterial agents as the cause of diseases classified elsewhere: Secondary | ICD-10-CM | POA: Diagnosis not present

## 2016-04-20 DIAGNOSIS — G14 Postpolio syndrome: Secondary | ICD-10-CM | POA: Diagnosis not present

## 2016-04-20 DIAGNOSIS — N400 Enlarged prostate without lower urinary tract symptoms: Secondary | ICD-10-CM | POA: Diagnosis not present

## 2016-04-20 DIAGNOSIS — J019 Acute sinusitis, unspecified: Secondary | ICD-10-CM | POA: Diagnosis not present

## 2016-04-24 DIAGNOSIS — J3089 Other allergic rhinitis: Secondary | ICD-10-CM | POA: Diagnosis not present

## 2016-04-24 DIAGNOSIS — J301 Allergic rhinitis due to pollen: Secondary | ICD-10-CM | POA: Diagnosis not present

## 2016-04-25 ENCOUNTER — Encounter: Payer: Self-pay | Admitting: Podiatry

## 2016-04-25 ENCOUNTER — Ambulatory Visit (INDEPENDENT_AMBULATORY_CARE_PROVIDER_SITE_OTHER): Payer: PPO | Admitting: Podiatry

## 2016-04-25 ENCOUNTER — Ambulatory Visit (INDEPENDENT_AMBULATORY_CARE_PROVIDER_SITE_OTHER): Payer: PPO

## 2016-04-25 VITALS — BP 116/72 | HR 85 | Resp 16

## 2016-04-25 DIAGNOSIS — Z9889 Other specified postprocedural states: Secondary | ICD-10-CM

## 2016-04-25 DIAGNOSIS — M2041 Other hammer toe(s) (acquired), right foot: Secondary | ICD-10-CM

## 2016-04-25 NOTE — Progress Notes (Signed)
He presents today for his final follow-up visit regarding distal arthroplasty third digit right foot. He states that is doing great and doesn't hurt at all.  Objective: Vital signs are stable he is alert and oriented 3 mild erythema minimal edema no cellulitis drainage or odor third digit right foot. Incision site has gone on to heal uneventfully.  Assessment: Well-healing surgical foot right  Plan: Follow up with me on an as-needed basis.

## 2016-05-01 DIAGNOSIS — J3089 Other allergic rhinitis: Secondary | ICD-10-CM | POA: Diagnosis not present

## 2016-05-01 DIAGNOSIS — J301 Allergic rhinitis due to pollen: Secondary | ICD-10-CM | POA: Diagnosis not present

## 2016-05-08 ENCOUNTER — Telehealth: Payer: Self-pay | Admitting: Oncology

## 2016-05-08 ENCOUNTER — Other Ambulatory Visit: Payer: Self-pay | Admitting: *Deleted

## 2016-05-08 ENCOUNTER — Other Ambulatory Visit (HOSPITAL_BASED_OUTPATIENT_CLINIC_OR_DEPARTMENT_OTHER): Payer: PPO

## 2016-05-08 ENCOUNTER — Ambulatory Visit (HOSPITAL_BASED_OUTPATIENT_CLINIC_OR_DEPARTMENT_OTHER): Payer: PPO | Admitting: Oncology

## 2016-05-08 VITALS — BP 140/68 | HR 99 | Temp 97.9°F | Resp 18 | Ht 66.0 in | Wt 172.4 lb

## 2016-05-08 DIAGNOSIS — J301 Allergic rhinitis due to pollen: Secondary | ICD-10-CM | POA: Diagnosis not present

## 2016-05-08 DIAGNOSIS — E8581 Light chain (AL) amyloidosis: Secondary | ICD-10-CM

## 2016-05-08 DIAGNOSIS — J3089 Other allergic rhinitis: Secondary | ICD-10-CM | POA: Diagnosis not present

## 2016-05-08 DIAGNOSIS — E858 Other amyloidosis: Secondary | ICD-10-CM

## 2016-05-08 LAB — CBC WITH DIFFERENTIAL/PLATELET
BASO%: 0.6 % (ref 0.0–2.0)
Basophils Absolute: 0 10*3/uL (ref 0.0–0.1)
EOS%: 3 % (ref 0.0–7.0)
Eosinophils Absolute: 0.2 10*3/uL (ref 0.0–0.5)
HCT: 43.2 % (ref 38.4–49.9)
HGB: 14.7 g/dL (ref 13.0–17.1)
LYMPH#: 1.6 10*3/uL (ref 0.9–3.3)
LYMPH%: 26.3 % (ref 14.0–49.0)
MCH: 30.9 pg (ref 27.2–33.4)
MCHC: 34.1 g/dL (ref 32.0–36.0)
MCV: 90.7 fL (ref 79.3–98.0)
MONO#: 0.7 10*3/uL (ref 0.1–0.9)
MONO%: 12.2 % (ref 0.0–14.0)
NEUT%: 57.9 % (ref 39.0–75.0)
NEUTROS ABS: 3.4 10*3/uL (ref 1.5–6.5)
Platelets: 111 10*3/uL — ABNORMAL LOW (ref 140–400)
RBC: 4.76 10*6/uL (ref 4.20–5.82)
RDW: 13.9 % (ref 11.0–14.6)
WBC: 5.9 10*3/uL (ref 4.0–10.3)

## 2016-05-08 LAB — COMPREHENSIVE METABOLIC PANEL
ALT: 23 U/L (ref 0–55)
AST: 28 U/L (ref 5–34)
Albumin: 4 g/dL (ref 3.5–5.0)
Alkaline Phosphatase: 55 U/L (ref 40–150)
Anion Gap: 8 mEq/L (ref 3–11)
BUN: 21.1 mg/dL (ref 7.0–26.0)
CHLORIDE: 105 meq/L (ref 98–109)
CO2: 26 meq/L (ref 22–29)
CREATININE: 1.1 mg/dL (ref 0.7–1.3)
Calcium: 9.2 mg/dL (ref 8.4–10.4)
EGFR: 66 mL/min/{1.73_m2} — ABNORMAL LOW (ref >90)
GLUCOSE: 126 mg/dL (ref 70–140)
Potassium: 3.9 mEq/L (ref 3.5–5.1)
SODIUM: 139 meq/L (ref 136–145)
TOTAL PROTEIN: 7 g/dL (ref 6.4–8.3)
Total Bilirubin: 0.77 mg/dL (ref 0.20–1.20)

## 2016-05-08 NOTE — Progress Notes (Signed)
  [place] OFFICE PROGRESS NOTE   Diagnosis: Amyloidosis  INTERVAL HISTORY:   Eugene Martinez returns as scheduled. He feels well. He is working part-time. No recent infection. He bruises easily. He had surgery on the right foot and reports improvement in discomfort.  Objective:  Vital signs in last 24 hours:  Blood pressure 140/68, pulse 99, temperature 97.9 F (36.6 C), temperature source Oral, resp. rate 18, height 5\' 6"  (1.676 m), weight 172 lb 6.4 oz (78.2 kg), SpO2 97 %.    HEENT: No bleeding or thrush Resp: Lungs clear bilaterally Cardio: Regular rate and rhythm, 2/6 systolic murmur GI: No hepatosplenomegaly, no mass, nontender Vascular: No leg edema   Lab Results:  Lab Results  Component Value Date   WBC 5.9 05/08/2016   HGB 14.7 05/08/2016   HCT 43.2 05/08/2016   MCV 90.7 05/08/2016   PLT 111* 05/08/2016   NEUTROABS 3.4 05/08/2016   24 urine total protein on 03/06/2016-109 mg, M spike not observed  01/09/2016-lambda free light chains 20.35 mg/L  Medications: I have reviewed the patient's current medications.  Assessment/Plan: 1. 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 Followed by Dr. Mare Ferrari, echocardiogram September 2015 with an LVEF of 65-70% and severe LVH 4. 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 Zoster rash December 2016   Disposition:  Eugene Martinez appears stable. The platelets have improved over the past several months. The thrombocytopenia earlier this year may have been related to the zoster infection.  We will follow-up on the 24-hour urine protein and light chains from today. He will return for an office  visit in 4 months.  Betsy Coder, MD  05/08/2016  3:49 PM

## 2016-05-08 NOTE — Telephone Encounter (Signed)
Gave and printed appt sched and avs for pt for Sept °

## 2016-05-09 LAB — KAPPA/LAMBDA LIGHT CHAINS
IG KAPPA FREE LIGHT CHAIN: 20.46 mg/L — AB (ref 3.30–19.40)
Ig Lambda Free Light Chain: 19.45 mg/L (ref 5.71–26.30)
KAPPA/LAMBDA FLC RATIO: 1.05 (ref 0.26–1.65)

## 2016-05-10 LAB — UPEP/TP, 24-HR URINE
ALPHA-1-GLOBULIN, U: 0 %
Albumin, U: 100 %
Alpha-2-Globulin, U: 0 %
Beta Globulin, U: 0 %
Gamma Globulin, U: 0 %

## 2016-05-15 DIAGNOSIS — J3089 Other allergic rhinitis: Secondary | ICD-10-CM | POA: Diagnosis not present

## 2016-05-15 DIAGNOSIS — J301 Allergic rhinitis due to pollen: Secondary | ICD-10-CM | POA: Diagnosis not present

## 2016-05-22 DIAGNOSIS — J3089 Other allergic rhinitis: Secondary | ICD-10-CM | POA: Diagnosis not present

## 2016-05-22 DIAGNOSIS — J301 Allergic rhinitis due to pollen: Secondary | ICD-10-CM | POA: Diagnosis not present

## 2016-06-05 DIAGNOSIS — J3089 Other allergic rhinitis: Secondary | ICD-10-CM | POA: Diagnosis not present

## 2016-06-05 DIAGNOSIS — H2513 Age-related nuclear cataract, bilateral: Secondary | ICD-10-CM | POA: Diagnosis not present

## 2016-06-05 DIAGNOSIS — J301 Allergic rhinitis due to pollen: Secondary | ICD-10-CM | POA: Diagnosis not present

## 2016-06-14 DIAGNOSIS — J301 Allergic rhinitis due to pollen: Secondary | ICD-10-CM | POA: Diagnosis not present

## 2016-06-14 DIAGNOSIS — J3089 Other allergic rhinitis: Secondary | ICD-10-CM | POA: Diagnosis not present

## 2016-06-19 DIAGNOSIS — J3089 Other allergic rhinitis: Secondary | ICD-10-CM | POA: Diagnosis not present

## 2016-06-19 DIAGNOSIS — J301 Allergic rhinitis due to pollen: Secondary | ICD-10-CM | POA: Diagnosis not present

## 2016-06-26 DIAGNOSIS — J301 Allergic rhinitis due to pollen: Secondary | ICD-10-CM | POA: Diagnosis not present

## 2016-06-26 DIAGNOSIS — J3089 Other allergic rhinitis: Secondary | ICD-10-CM | POA: Diagnosis not present

## 2016-07-02 DIAGNOSIS — J301 Allergic rhinitis due to pollen: Secondary | ICD-10-CM | POA: Diagnosis not present

## 2016-07-02 DIAGNOSIS — J3089 Other allergic rhinitis: Secondary | ICD-10-CM | POA: Diagnosis not present

## 2016-07-10 DIAGNOSIS — J3089 Other allergic rhinitis: Secondary | ICD-10-CM | POA: Diagnosis not present

## 2016-07-10 DIAGNOSIS — J301 Allergic rhinitis due to pollen: Secondary | ICD-10-CM | POA: Diagnosis not present

## 2016-07-17 DIAGNOSIS — J3089 Other allergic rhinitis: Secondary | ICD-10-CM | POA: Diagnosis not present

## 2016-07-17 DIAGNOSIS — J301 Allergic rhinitis due to pollen: Secondary | ICD-10-CM | POA: Diagnosis not present

## 2016-07-24 DIAGNOSIS — J301 Allergic rhinitis due to pollen: Secondary | ICD-10-CM | POA: Diagnosis not present

## 2016-07-24 DIAGNOSIS — J3089 Other allergic rhinitis: Secondary | ICD-10-CM | POA: Diagnosis not present

## 2016-07-26 DIAGNOSIS — J301 Allergic rhinitis due to pollen: Secondary | ICD-10-CM | POA: Diagnosis not present

## 2016-07-26 DIAGNOSIS — J3089 Other allergic rhinitis: Secondary | ICD-10-CM | POA: Diagnosis not present

## 2016-07-31 DIAGNOSIS — J3089 Other allergic rhinitis: Secondary | ICD-10-CM | POA: Diagnosis not present

## 2016-07-31 DIAGNOSIS — J301 Allergic rhinitis due to pollen: Secondary | ICD-10-CM | POA: Diagnosis not present

## 2016-08-01 DIAGNOSIS — K219 Gastro-esophageal reflux disease without esophagitis: Secondary | ICD-10-CM | POA: Diagnosis not present

## 2016-08-01 DIAGNOSIS — Z0000 Encounter for general adult medical examination without abnormal findings: Secondary | ICD-10-CM | POA: Diagnosis not present

## 2016-08-01 DIAGNOSIS — Z125 Encounter for screening for malignant neoplasm of prostate: Secondary | ICD-10-CM | POA: Diagnosis not present

## 2016-08-01 DIAGNOSIS — D696 Thrombocytopenia, unspecified: Secondary | ICD-10-CM | POA: Diagnosis not present

## 2016-08-01 DIAGNOSIS — G14 Postpolio syndrome: Secondary | ICD-10-CM | POA: Diagnosis not present

## 2016-08-01 DIAGNOSIS — T7840XD Allergy, unspecified, subsequent encounter: Secondary | ICD-10-CM | POA: Diagnosis not present

## 2016-08-01 DIAGNOSIS — E858 Other amyloidosis: Secondary | ICD-10-CM | POA: Diagnosis not present

## 2016-08-02 DIAGNOSIS — J3089 Other allergic rhinitis: Secondary | ICD-10-CM | POA: Diagnosis not present

## 2016-08-02 DIAGNOSIS — J301 Allergic rhinitis due to pollen: Secondary | ICD-10-CM | POA: Diagnosis not present

## 2016-08-07 DIAGNOSIS — J301 Allergic rhinitis due to pollen: Secondary | ICD-10-CM | POA: Diagnosis not present

## 2016-08-07 DIAGNOSIS — J3089 Other allergic rhinitis: Secondary | ICD-10-CM | POA: Diagnosis not present

## 2016-08-08 DIAGNOSIS — D696 Thrombocytopenia, unspecified: Secondary | ICD-10-CM | POA: Diagnosis not present

## 2016-08-08 DIAGNOSIS — N400 Enlarged prostate without lower urinary tract symptoms: Secondary | ICD-10-CM | POA: Diagnosis not present

## 2016-08-08 DIAGNOSIS — K219 Gastro-esophageal reflux disease without esophagitis: Secondary | ICD-10-CM | POA: Diagnosis not present

## 2016-08-08 DIAGNOSIS — J4520 Mild intermittent asthma, uncomplicated: Secondary | ICD-10-CM | POA: Diagnosis not present

## 2016-08-08 DIAGNOSIS — E858 Other amyloidosis: Secondary | ICD-10-CM | POA: Diagnosis not present

## 2016-08-14 DIAGNOSIS — J3089 Other allergic rhinitis: Secondary | ICD-10-CM | POA: Diagnosis not present

## 2016-08-14 DIAGNOSIS — J301 Allergic rhinitis due to pollen: Secondary | ICD-10-CM | POA: Diagnosis not present

## 2016-08-21 DIAGNOSIS — J3089 Other allergic rhinitis: Secondary | ICD-10-CM | POA: Diagnosis not present

## 2016-08-21 DIAGNOSIS — J301 Allergic rhinitis due to pollen: Secondary | ICD-10-CM | POA: Diagnosis not present

## 2016-08-28 DIAGNOSIS — J301 Allergic rhinitis due to pollen: Secondary | ICD-10-CM | POA: Diagnosis not present

## 2016-08-28 DIAGNOSIS — J3089 Other allergic rhinitis: Secondary | ICD-10-CM | POA: Diagnosis not present

## 2016-08-29 ENCOUNTER — Ambulatory Visit (INDEPENDENT_AMBULATORY_CARE_PROVIDER_SITE_OTHER): Payer: PPO | Admitting: Cardiovascular Disease

## 2016-08-29 ENCOUNTER — Encounter: Payer: Self-pay | Admitting: Cardiovascular Disease

## 2016-08-29 VITALS — BP 144/74 | HR 86 | Resp 16 | Ht 66.0 in | Wt 171.5 lb

## 2016-08-29 DIAGNOSIS — I452 Bifascicular block: Secondary | ICD-10-CM

## 2016-08-29 DIAGNOSIS — I10 Essential (primary) hypertension: Secondary | ICD-10-CM | POA: Diagnosis not present

## 2016-08-29 DIAGNOSIS — E858 Other amyloidosis: Secondary | ICD-10-CM | POA: Diagnosis not present

## 2016-08-29 DIAGNOSIS — I517 Cardiomegaly: Secondary | ICD-10-CM | POA: Insufficient documentation

## 2016-08-29 DIAGNOSIS — R55 Syncope and collapse: Secondary | ICD-10-CM

## 2016-08-29 DIAGNOSIS — E8581 Light chain (AL) amyloidosis: Secondary | ICD-10-CM

## 2016-08-29 NOTE — Patient Instructions (Signed)

## 2016-08-29 NOTE — Progress Notes (Signed)
Cardiology Office Note  Date:  08/29/2016   ID:  Eugene Martinez, Eugene Martinez [birth year], MRN [id number]  PCP:  Tracie Harrier, MD   Chief Complaint  Patient presents with  . other    Former [place]. Meds reviewed verbally with pt.    HPI:  70 year old gentleman With history of AL Amyloidosis, severe LVH on echo and MRI, prior history of exertional syncope who presents for routine follow-up of his severe LVH, amyloid disease  In follow-up he reports that he feels well, no complaints Continues to work part-time, no regular exercise program but does stay active Denies any lower extremity edema, no near syncope or syncope.  Denies any tachycardia or palpitations  Has not had to take Lasix Currently doing a 24-hour urine collection  previously had thrombocytopenia , now improving in the past few months   He denies any shortness of breath with activity.  He is not experiencing any paroxysmal nocturnal dyspnea.    He does wear a leg brace on his right lower leg.   EKG on today's visit shows normal sinus rhythm with rate 86 bpm, right bundle branch block, LVH    other past medical history September 2015 hospitalized at [facility] hospital after experiencing exertional syncope while walking up steps    echocardiogram with  significant left ventricular hypertrophy and a speckling of the myocardium   Systolic function was normal with ejection fraction of 55-60% and the left atrium was moderately dilated.  cardiac MRI 2013 Severe LVH, Left ventricular wall motion was normal with an ejection fraction of 66%. mild right ventricular hypertrophy and normal right atrial size and mild to moderate mitral regurgitation.  Rarely takes Lasix as needed if his weight increased or if he had unusual shortness of breath.  urine immunoelectrophoresis   light chain Bence-Jones protein in the urine.  fat pad biopsy which was not diagnostic for myeloma or amyloidosis and a bone marrow biopsy which was not  diagnostic.  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  completed chemotherapy for his amyloidosis and has been in remission since December 2014. He has had previous harvest of stem cells down at [facility] but has not had a stem cell transplant.  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  echocardiogram September 2015 with an LVEF of 65-70% and severe LVH 4. 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 Zoster rash December 2016  PMH:   has a past medical history of Hypertension; Mild mitral regurgitation; and Syncope.  PSH:    Past Surgical History:  Procedure Laterality Date  . FOOT SURGERY     right foot    Current Outpatient Prescriptions  Medication Sig Dispense Refill  . aspirin 81 MG chewable tablet Chew 81 mg by mouth daily.    Marland Kitchen azelastine (ASTELIN) 137 MCG/SPRAY nasal spray Place 1-2 sprays into both nostrils every 12 (twelve) hours as needed (allergies).     . cholecalciferol (VITAMIN D) 1000 UNITS tablet Take 1,000 Units by mouth at bedtime.     . ferrous sulfate 325 (65 FE) MG tablet Take 325 mg by mouth daily.  3  . finasteride (PROSCAR) 5 MG tablet Take 5 mg by mouth every morning.     Marland Kitchen  levocetirizine (XYZAL) 5 MG tablet Take 5 mg by mouth at bedtime. To relieve allergy symptoms    . montelukast (SINGULAIR) 10 MG tablet Take 10 mg by mouth at bedtime.    . Multiple Vitamins-Minerals (CENTRUM SILVER ULTRA MENS) TABS Take 1 tablet by mouth every morning.     . Omega-3 Fatty Acids (FISH OIL) 1200 MG CAPS Take 1,200 mg by mouth 2 (two) times daily.     Marland Kitchen omeprazole (PRILOSEC) 20 MG capsule Take 20 mg by mouth  daily before breakfast. Takes 30 minutes before breakfast for acid reflux    . torsemide (DEMADEX) 20 MG tablet TAKE 1 TABLET BY MOUTH TWO TIMES DAILY. TAKE IF WT. INCREASES 2-5 LBS 60 tablet 0  . triamcinolone cream (KENALOG) 0.1 % Apply 1 application topically daily as needed (skin rash).   2  . vitamin E 400 UNIT capsule Take 400 Units by mouth every morning.      No current facility-administered medications for this visit.      Allergies:   Other   Social History:  The patient  reports that he has never smoked. He has never used smokeless tobacco. He reports that he does not drink alcohol or use drugs.   Family History:   family history includes Heart attack in his father.    Review of Systems: Review of Systems  Constitutional: Negative.   Respiratory: Negative.   Cardiovascular: Negative.   Gastrointestinal: Negative.   Musculoskeletal: Positive for joint pain.  Neurological: Negative.   Psychiatric/Behavioral: Negative.   All other systems reviewed and are negative.    PHYSICAL EXAM: VS:  BP (!) 144/74 (BP Location: Right Arm, Patient Position: Sitting, Cuff Size: Normal)   Pulse 86   Resp 16   Ht '5\' 6"'  (1.676 m)   Wt 171 lb 8 oz (77.8 kg)   BMI 27.68 kg/m  , BMI Body mass index is 27.68 kg/m. GEN: Well nourished, well developed, in no acute distress  HEENT: normal  Neck: no JVD, carotid bruits, or masses Cardiac: RRR; no murmurs, rubs, +gallops, no edema  Respiratory:  clear to auscultation bilaterally, normal work of breathing GI: soft, nontender, nondistended, + BS MS: no deformity or atrophy , leg brace right leg Skin: warm and dry, no rash Neuro:  Strength and sensation are intact,  Psych: euthymic mood, full affect    Recent Labs: 05/08/2016: ALT 23; BUN 21.1; Creatinine 1.1; HGB 14.7; Platelets 111; Potassium 3.9; Sodium 139    Lipid Panel Lab Results  Component Value Date   CHOL 209 (H) 10/31/2012   HDL 37 (L) 10/31/2012   LDLCALC 148 (H)  10/31/2012   TRIG 118 10/31/2012      Wt Readings from Last 3 Encounters:  08/29/16 171 lb 8 oz (77.8 kg)  05/08/16 172 lb 6.4 oz (78.2 kg)  01/09/16 170 lb 11.2 oz (77.4 kg)       ASSESSMENT AND PLAN:  Essential hypertension - Plan: EKG 12-Lead Blood pressure borderline elevated, recommended he monitor his numbers. No medication changes made  Syncope, unspecified syncope type Denies any near-syncope or syncope discussed his severe LVH anatomy with him, high risk of further events in the future. Would avoid dehydration Potentially could add calcium channel blockers if needed  AL amyloidosis (HCC) Reports symptoms stable, platelets rising   LVH (left ventricular hypertrophy) As above, severe LVH on echocardiogram and MRI   Total encounter time more than 15 minutes  Greater than 50% was spent in counseling and coordination  of care with the patient   Disposition:   F/U  12 months   Orders Placed This Encounter  Procedures  . EKG 12-Lead     Signed, Esmond Plants, M.D., Ph.D. 08/29/2016  [place], [place]

## 2016-08-30 ENCOUNTER — Telehealth: Payer: Self-pay | Admitting: Oncology

## 2016-08-30 ENCOUNTER — Ambulatory Visit (HOSPITAL_BASED_OUTPATIENT_CLINIC_OR_DEPARTMENT_OTHER): Payer: PPO | Admitting: Oncology

## 2016-08-30 ENCOUNTER — Other Ambulatory Visit (HOSPITAL_BASED_OUTPATIENT_CLINIC_OR_DEPARTMENT_OTHER): Payer: PPO

## 2016-08-30 VITALS — BP 142/75 | HR 79 | Temp 98.2°F | Resp 17 | Ht 66.0 in | Wt 169.6 lb

## 2016-08-30 DIAGNOSIS — E858 Other amyloidosis: Secondary | ICD-10-CM | POA: Diagnosis not present

## 2016-08-30 DIAGNOSIS — E8581 Light chain (AL) amyloidosis: Secondary | ICD-10-CM

## 2016-08-30 LAB — COMPREHENSIVE METABOLIC PANEL
ALT: 26 U/L (ref 0–55)
ANION GAP: 10 meq/L (ref 3–11)
AST: 33 U/L (ref 5–34)
Albumin: 4 g/dL (ref 3.5–5.0)
Alkaline Phosphatase: 58 U/L (ref 40–150)
BUN: 15.8 mg/dL (ref 7.0–26.0)
CHLORIDE: 105 meq/L (ref 98–109)
CO2: 26 meq/L (ref 22–29)
CREATININE: 0.9 mg/dL (ref 0.7–1.3)
Calcium: 9.2 mg/dL (ref 8.4–10.4)
EGFR: 81 mL/min/{1.73_m2} — ABNORMAL LOW (ref >90)
GLUCOSE: 97 mg/dL (ref 70–140)
Potassium: 3.9 mEq/L (ref 3.5–5.1)
Sodium: 141 mEq/L (ref 136–145)
TOTAL PROTEIN: 7.1 g/dL (ref 6.4–8.3)
Total Bilirubin: 1.18 mg/dL (ref 0.20–1.20)

## 2016-08-30 LAB — CBC WITH DIFFERENTIAL/PLATELET
BASO%: 0.5 % (ref 0.0–2.0)
Basophils Absolute: 0 10*3/uL (ref 0.0–0.1)
EOS%: 2.8 % (ref 0.0–7.0)
Eosinophils Absolute: 0.1 10*3/uL (ref 0.0–0.5)
HEMATOCRIT: 43.1 % (ref 38.4–49.9)
HEMOGLOBIN: 14.3 g/dL (ref 13.0–17.1)
LYMPH#: 1.3 10*3/uL (ref 0.9–3.3)
LYMPH%: 28.3 % (ref 14.0–49.0)
MCH: 30.7 pg (ref 27.2–33.4)
MCHC: 33.3 g/dL (ref 32.0–36.0)
MCV: 92.2 fL (ref 79.3–98.0)
MONO#: 0.6 10*3/uL (ref 0.1–0.9)
MONO%: 12 % (ref 0.0–14.0)
NEUT%: 56.4 % (ref 39.0–75.0)
NEUTROS ABS: 2.7 10*3/uL (ref 1.5–6.5)
PLATELETS: 100 10*3/uL — AB (ref 140–400)
RBC: 4.67 10*6/uL (ref 4.20–5.82)
RDW: 14 % (ref 11.0–14.6)
WBC: 4.7 10*3/uL (ref 4.0–10.3)

## 2016-08-30 NOTE — Progress Notes (Signed)
  [place] OFFICE PROGRESS NOTE   Diagnosis: Amyloidosis  INTERVAL HISTORY:   Eugene Martinez returns as scheduled. He feels well. No recent infection. He has a dry rash at the forearms. No other complaint.  Objective:  Vital signs in last 24 hours:  Blood pressure (!) 142/75, pulse 79, temperature 98.2 F (36.8 C), temperature source Oral, resp. rate 17, height 5\' 6"  (1.676 m), weight 169 lb 9.6 oz (76.9 kg), SpO2 99 %.    HEENT: Mouth without bruising Lymphatics: No cervical, supra-clavicular, or inguinal nodes. "Shotty "bilateral axillary nodes versus prominent fat pads Resp: Lungs clear bilaterally Cardio: Regular rate and rhythm GI: No hepatosplenomegaly Vascular: No leg edema  Skin: Dry confluent mild erythema at the forearms   Portacath/PICC-without erythema  Lab Results:  Lab Results  Component Value Date   WBC 4.7 08/30/2016   HGB 14.3 08/30/2016   HCT 43.1 08/30/2016   MCV 92.2 08/30/2016   PLT 100 (L) 08/30/2016   NEUTROABS 2.7 08/30/2016   Medications: I have reviewed the patient's current medications.  Assessment/Plan: 1. 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 Followed by Dr. Mare Ferrari, echocardiogram September 2015 with an LVEF of 65-70% and severe LVH 4. 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 Zoster rash December 2016    Disposition:  Eugene Martinez appears stable. We will follow-up on the serum light chains and 24-hour urine from today. 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 He will obtain an influenza vaccine.  Eugene Martinez will return for an office and lab  visit in 4 months.  Betsy Coder, MD  08/30/2016  10:32 AM

## 2016-08-30 NOTE — Telephone Encounter (Signed)
Gave patient avs report and appointments for January  °

## 2016-08-31 LAB — UPEP/TP, 24-HR URINE
ALBUMIN, U: 100 %
ALPHA-1-GLOBULIN, U: 0 %
Alpha-2-Globulin, U: 0 %
BETA GLOBULIN, U: 0 %
GAMMA GLOBULIN, U: 0 %
PROTEIN,TOTAL,URINE: 11.5 mg/dL
Prot,24hr calculated: 230 mg/24 hr — ABNORMAL HIGH (ref 30–150)

## 2016-08-31 LAB — KAPPA/LAMBDA LIGHT CHAINS
IG KAPPA FREE LIGHT CHAIN: 15.9 mg/L (ref 3.3–19.4)
IG LAMBDA FREE LIGHT CHAIN: 17.8 mg/L (ref 5.7–26.3)
KAPPA/LAMBDA FLC RATIO: 0.89 (ref 0.26–1.65)

## 2016-08-31 LAB — IGG, IGA, IGM
IgA, Qn, Serum: 182 mg/dL (ref 61–437)
IgG, Qn, Serum: 763 mg/dL (ref 700–1600)
IgM, Qn, Serum: 38 mg/dL (ref 15–143)

## 2016-09-04 DIAGNOSIS — J301 Allergic rhinitis due to pollen: Secondary | ICD-10-CM | POA: Diagnosis not present

## 2016-09-04 DIAGNOSIS — J3089 Other allergic rhinitis: Secondary | ICD-10-CM | POA: Diagnosis not present

## 2016-09-11 DIAGNOSIS — J301 Allergic rhinitis due to pollen: Secondary | ICD-10-CM | POA: Diagnosis not present

## 2016-09-11 DIAGNOSIS — J3089 Other allergic rhinitis: Secondary | ICD-10-CM | POA: Diagnosis not present

## 2016-09-18 DIAGNOSIS — J3089 Other allergic rhinitis: Secondary | ICD-10-CM | POA: Diagnosis not present

## 2016-09-18 DIAGNOSIS — J301 Allergic rhinitis due to pollen: Secondary | ICD-10-CM | POA: Diagnosis not present

## 2016-09-25 DIAGNOSIS — J301 Allergic rhinitis due to pollen: Secondary | ICD-10-CM | POA: Diagnosis not present

## 2016-09-25 DIAGNOSIS — J3089 Other allergic rhinitis: Secondary | ICD-10-CM | POA: Diagnosis not present

## 2016-10-02 DIAGNOSIS — J3089 Other allergic rhinitis: Secondary | ICD-10-CM | POA: Diagnosis not present

## 2016-10-02 DIAGNOSIS — J301 Allergic rhinitis due to pollen: Secondary | ICD-10-CM | POA: Diagnosis not present

## 2016-10-09 DIAGNOSIS — J3089 Other allergic rhinitis: Secondary | ICD-10-CM | POA: Diagnosis not present

## 2016-10-09 DIAGNOSIS — J301 Allergic rhinitis due to pollen: Secondary | ICD-10-CM | POA: Diagnosis not present

## 2016-10-18 DIAGNOSIS — J301 Allergic rhinitis due to pollen: Secondary | ICD-10-CM | POA: Diagnosis not present

## 2016-10-18 DIAGNOSIS — J3089 Other allergic rhinitis: Secondary | ICD-10-CM | POA: Diagnosis not present

## 2016-11-06 DIAGNOSIS — J3089 Other allergic rhinitis: Secondary | ICD-10-CM | POA: Diagnosis not present

## 2016-11-06 DIAGNOSIS — J301 Allergic rhinitis due to pollen: Secondary | ICD-10-CM | POA: Diagnosis not present

## 2016-11-13 DIAGNOSIS — J3089 Other allergic rhinitis: Secondary | ICD-10-CM | POA: Diagnosis not present

## 2016-11-13 DIAGNOSIS — J301 Allergic rhinitis due to pollen: Secondary | ICD-10-CM | POA: Diagnosis not present

## 2016-11-14 DIAGNOSIS — J45909 Unspecified asthma, uncomplicated: Secondary | ICD-10-CM | POA: Diagnosis not present

## 2016-11-14 DIAGNOSIS — J40 Bronchitis, not specified as acute or chronic: Secondary | ICD-10-CM | POA: Diagnosis not present

## 2016-11-20 DIAGNOSIS — J301 Allergic rhinitis due to pollen: Secondary | ICD-10-CM | POA: Diagnosis not present

## 2016-11-20 DIAGNOSIS — J3089 Other allergic rhinitis: Secondary | ICD-10-CM | POA: Diagnosis not present

## 2016-12-13 DIAGNOSIS — J3089 Other allergic rhinitis: Secondary | ICD-10-CM | POA: Diagnosis not present

## 2016-12-13 DIAGNOSIS — J301 Allergic rhinitis due to pollen: Secondary | ICD-10-CM | POA: Diagnosis not present

## 2016-12-18 DIAGNOSIS — J3089 Other allergic rhinitis: Secondary | ICD-10-CM | POA: Diagnosis not present

## 2016-12-18 DIAGNOSIS — J301 Allergic rhinitis due to pollen: Secondary | ICD-10-CM | POA: Diagnosis not present

## 2016-12-19 DIAGNOSIS — Z1283 Encounter for screening for malignant neoplasm of skin: Secondary | ICD-10-CM | POA: Diagnosis not present

## 2016-12-19 DIAGNOSIS — D1800 Hemangioma unspecified site: Secondary | ICD-10-CM | POA: Diagnosis not present

## 2016-12-19 DIAGNOSIS — E859 Amyloidosis, unspecified: Secondary | ICD-10-CM | POA: Diagnosis not present

## 2016-12-19 DIAGNOSIS — D485 Neoplasm of uncertain behavior of skin: Secondary | ICD-10-CM | POA: Diagnosis not present

## 2016-12-19 DIAGNOSIS — L821 Other seborrheic keratosis: Secondary | ICD-10-CM | POA: Diagnosis not present

## 2016-12-19 DIAGNOSIS — L578 Other skin changes due to chronic exposure to nonionizing radiation: Secondary | ICD-10-CM | POA: Diagnosis not present

## 2016-12-19 DIAGNOSIS — R202 Paresthesia of skin: Secondary | ICD-10-CM | POA: Diagnosis not present

## 2016-12-19 DIAGNOSIS — D229 Melanocytic nevi, unspecified: Secondary | ICD-10-CM | POA: Diagnosis not present

## 2016-12-19 DIAGNOSIS — B351 Tinea unguium: Secondary | ICD-10-CM | POA: Diagnosis not present

## 2016-12-19 DIAGNOSIS — L812 Freckles: Secondary | ICD-10-CM | POA: Diagnosis not present

## 2016-12-25 DIAGNOSIS — J3089 Other allergic rhinitis: Secondary | ICD-10-CM | POA: Diagnosis not present

## 2016-12-25 DIAGNOSIS — J301 Allergic rhinitis due to pollen: Secondary | ICD-10-CM | POA: Diagnosis not present

## 2016-12-27 ENCOUNTER — Telehealth: Payer: Self-pay | Admitting: Oncology

## 2016-12-27 ENCOUNTER — Other Ambulatory Visit (HOSPITAL_BASED_OUTPATIENT_CLINIC_OR_DEPARTMENT_OTHER): Payer: PPO

## 2016-12-27 ENCOUNTER — Ambulatory Visit (HOSPITAL_BASED_OUTPATIENT_CLINIC_OR_DEPARTMENT_OTHER): Payer: PPO | Admitting: Oncology

## 2016-12-27 VITALS — BP 139/71 | HR 88 | Temp 98.9°F | Resp 17 | Ht 66.0 in | Wt 168.9 lb

## 2016-12-27 DIAGNOSIS — I429 Cardiomyopathy, unspecified: Secondary | ICD-10-CM

## 2016-12-27 DIAGNOSIS — C9000 Multiple myeloma not having achieved remission: Secondary | ICD-10-CM | POA: Diagnosis not present

## 2016-12-27 DIAGNOSIS — R438 Other disturbances of smell and taste: Secondary | ICD-10-CM | POA: Diagnosis not present

## 2016-12-27 DIAGNOSIS — E8581 Light chain (AL) amyloidosis: Secondary | ICD-10-CM

## 2016-12-27 LAB — COMPREHENSIVE METABOLIC PANEL
ALBUMIN: 4 g/dL (ref 3.5–5.0)
ALT: 25 U/L (ref 0–55)
AST: 28 U/L (ref 5–34)
Alkaline Phosphatase: 60 U/L (ref 40–150)
Anion Gap: 8 mEq/L (ref 3–11)
BILIRUBIN TOTAL: 0.84 mg/dL (ref 0.20–1.20)
BUN: 17.8 mg/dL (ref 7.0–26.0)
CALCIUM: 9.6 mg/dL (ref 8.4–10.4)
CO2: 28 mEq/L (ref 22–29)
Chloride: 104 mEq/L (ref 98–109)
Creatinine: 0.9 mg/dL (ref 0.7–1.3)
EGFR: 85 mL/min/{1.73_m2} — ABNORMAL LOW (ref >90)
GLUCOSE: 135 mg/dL (ref 70–140)
Potassium: 4.2 mEq/L (ref 3.5–5.1)
SODIUM: 139 meq/L (ref 136–145)
Total Protein: 6.9 g/dL (ref 6.4–8.3)

## 2016-12-27 LAB — CBC WITH DIFFERENTIAL/PLATELET
BASO%: 0.3 % (ref 0.0–2.0)
BASOS ABS: 0 10*3/uL (ref 0.0–0.1)
EOS%: 2.7 % (ref 0.0–7.0)
Eosinophils Absolute: 0.2 10*3/uL (ref 0.0–0.5)
HEMATOCRIT: 41.6 % (ref 38.4–49.9)
HGB: 14.2 g/dL (ref 13.0–17.1)
LYMPH#: 1.3 10*3/uL (ref 0.9–3.3)
LYMPH%: 22.5 % (ref 14.0–49.0)
MCH: 31.2 pg (ref 27.2–33.4)
MCHC: 34.1 g/dL (ref 32.0–36.0)
MCV: 91.5 fL (ref 79.3–98.0)
MONO#: 0.6 10*3/uL (ref 0.1–0.9)
MONO%: 10.1 % (ref 0.0–14.0)
NEUT#: 3.7 10*3/uL (ref 1.5–6.5)
NEUT%: 64.4 % (ref 39.0–75.0)
PLATELETS: 128 10*3/uL — AB (ref 140–400)
RBC: 4.54 10*6/uL (ref 4.20–5.82)
RDW: 13.5 % (ref 11.0–14.6)
WBC: 5.7 10*3/uL (ref 4.0–10.3)

## 2016-12-27 NOTE — Progress Notes (Signed)
  [place] OFFICE PROGRESS NOTE   Diagnosis: Amyloidosis  INTERVAL HISTORY:   Mr. Corpe returns as scheduled. He feels well. No leg swelling. He reports a few episodes of diarrhea since he was last here. No consistent diarrhea. He had an episode of bronchitis 1 month ago. He complains of loss of smell and taste since completing treatment in 2015.  Objective:  Vital signs in last 24 hours:  Blood pressure 139/71, pulse 88, temperature 98.9 F (37.2 C), temperature source Oral, resp. rate 17, height 5\' 6"  (1.676 m), weight 168 lb 14.4 oz (76.6 kg), SpO2 98 %.    HEENT: No bleeding. No macroglossia Resp: Lungs clear bilaterally Cardio: Regular rate and rhythm, 2/6 systolic murmur, split S2 versus an S3 gallop GI: No hepatosplenomegaly Vascular: No leg edema   Lab Results:  Lab Results  Component Value Date   WBC 5.7 12/27/2016   HGB 14.2 12/27/2016   HCT 41.6 12/27/2016   MCV 91.5 12/27/2016   PLT 128 (L) 12/27/2016   NEUTROABS 3.7 12/27/2016   08/30/2016: IgG 763, Or free light chains 15.9, lambda free light chain 17.9, 24 urine protein 230 mg-no M spike observed  Medications: I have reviewed the patient's current medications.  Assessment/Plan: 1. 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 Followed by cardiology, echocardiogram September 2015 with an LVEF of 65-70% and severe LVH 4. 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 Zoster rash December 2016    Disposition: Eugene Martinez appears stable. It is possible the loss of taste and smell is related to amyloidosis. We will follow-up on the 24 urine from today. He will return  for an office and lab visit in 6 months.   Betsy Coder, MD  12/27/2016  12:52 PM

## 2016-12-27 NOTE — Telephone Encounter (Signed)
Gave patient avs report and appointments for July.  °

## 2016-12-28 LAB — KAPPA/LAMBDA LIGHT CHAINS
Ig Kappa Free Light Chain: 19.1 mg/L (ref 3.3–19.4)
Ig Lambda Free Light Chain: 22.2 mg/L (ref 5.7–26.3)
Kappa/Lambda FluidC Ratio: 0.86 (ref 0.26–1.65)

## 2017-01-01 DIAGNOSIS — J301 Allergic rhinitis due to pollen: Secondary | ICD-10-CM | POA: Diagnosis not present

## 2017-01-01 DIAGNOSIS — J3089 Other allergic rhinitis: Secondary | ICD-10-CM | POA: Diagnosis not present

## 2017-01-02 LAB — UPEP/UIFE/LIGHT CHAINS/TP, 24-HR UR
% BETA, Urine: 0 %
ALBUMIN, U: 100 %
ALPHA 1 URINE: 0 %
ALPHA-2-GLOBULIN, U: 0 %
Free Kappa Lt Chains,Ur: 6.15 mg/L (ref 1.35–24.19)
Free Lambda Lt Chains,Ur: 0.51 mg/L (ref 0.24–6.66)
GAMMA GLOBULIN URINE: 0 %
Kappa/Lambda Ratio,U: 12.06 — ABNORMAL HIGH (ref 2.04–10.37)
PROTEIN,TOTAL,URINE: 6.1 mg/dL
Prot,24hr calculated: 122 mg/24 hr (ref 30–150)

## 2017-01-10 DIAGNOSIS — J301 Allergic rhinitis due to pollen: Secondary | ICD-10-CM | POA: Diagnosis not present

## 2017-01-10 DIAGNOSIS — J3089 Other allergic rhinitis: Secondary | ICD-10-CM | POA: Diagnosis not present

## 2017-01-15 DIAGNOSIS — J301 Allergic rhinitis due to pollen: Secondary | ICD-10-CM | POA: Diagnosis not present

## 2017-01-15 DIAGNOSIS — J3089 Other allergic rhinitis: Secondary | ICD-10-CM | POA: Diagnosis not present

## 2017-01-17 DIAGNOSIS — J3089 Other allergic rhinitis: Secondary | ICD-10-CM | POA: Diagnosis not present

## 2017-01-17 DIAGNOSIS — J301 Allergic rhinitis due to pollen: Secondary | ICD-10-CM | POA: Diagnosis not present

## 2017-01-21 ENCOUNTER — Ambulatory Visit (INDEPENDENT_AMBULATORY_CARE_PROVIDER_SITE_OTHER): Payer: PPO | Admitting: Podiatry

## 2017-01-21 ENCOUNTER — Encounter: Payer: Self-pay | Admitting: Podiatry

## 2017-01-21 DIAGNOSIS — S90822A Blister (nonthermal), left foot, initial encounter: Secondary | ICD-10-CM | POA: Diagnosis not present

## 2017-01-21 DIAGNOSIS — L089 Local infection of the skin and subcutaneous tissue, unspecified: Secondary | ICD-10-CM

## 2017-01-21 MED ORDER — CEPHALEXIN 500 MG PO CAPS: 500.0000 mg | ORAL_CAPSULE | Freq: Three times a day (TID) | ORAL | 0 refills | Status: DC

## 2017-01-21 MED ORDER — MUPIROCIN 2 % EX OINT: TOPICAL_OINTMENT | CUTANEOUS | 2 refills | Status: DC

## 2017-01-21 NOTE — Progress Notes (Signed)
He presents today with a chief complaint of a blister on the bottom of his right foot times the past past 5-6 days he states is painful to walk on he denies any trauma he continues to wear his drop foot brace.  Objective: Vital signs are stable alert and oriented 3. Pulses are palpable. Neurologic sensorium is intact. Deep tendon reflexes are intact cutaneous evaluation does demonstrate bolus in the shape of an L on the plantar aspect of his left foot appears to be almost like a contact dermatitis with surrounding erythema and small areas of what appears to be cellulitis. I wiped the area with Betadine today and lanced the bolus there was no bleeding no purulence and no malodor just a serosanguineous type fluid and placed Silvadene on the bottom of the foot and a dry sterile compressive dressing.  Assessment: Blister to the plantar aspect of the left foot.  Plan: Incision and drainage today started him on Keflex and Bactroban ointment instructed him to keep the foot dry and clean. Follow up with him in 1 week

## 2017-01-24 DIAGNOSIS — D696 Thrombocytopenia, unspecified: Secondary | ICD-10-CM | POA: Diagnosis not present

## 2017-01-24 DIAGNOSIS — J3089 Other allergic rhinitis: Secondary | ICD-10-CM | POA: Diagnosis not present

## 2017-01-24 DIAGNOSIS — K219 Gastro-esophageal reflux disease without esophagitis: Secondary | ICD-10-CM | POA: Diagnosis not present

## 2017-01-24 DIAGNOSIS — J4520 Mild intermittent asthma, uncomplicated: Secondary | ICD-10-CM | POA: Diagnosis not present

## 2017-01-24 DIAGNOSIS — N400 Enlarged prostate without lower urinary tract symptoms: Secondary | ICD-10-CM | POA: Diagnosis not present

## 2017-01-24 DIAGNOSIS — E8589 Other amyloidosis: Secondary | ICD-10-CM | POA: Diagnosis not present

## 2017-01-24 DIAGNOSIS — J301 Allergic rhinitis due to pollen: Secondary | ICD-10-CM | POA: Diagnosis not present

## 2017-01-29 DIAGNOSIS — J301 Allergic rhinitis due to pollen: Secondary | ICD-10-CM | POA: Diagnosis not present

## 2017-01-29 DIAGNOSIS — J3089 Other allergic rhinitis: Secondary | ICD-10-CM | POA: Diagnosis not present

## 2017-01-30 ENCOUNTER — Encounter: Payer: Self-pay | Admitting: Podiatry

## 2017-01-30 ENCOUNTER — Ambulatory Visit (INDEPENDENT_AMBULATORY_CARE_PROVIDER_SITE_OTHER): Payer: Self-pay | Admitting: Podiatry

## 2017-01-30 DIAGNOSIS — L089 Local infection of the skin and subcutaneous tissue, unspecified: Secondary | ICD-10-CM

## 2017-01-30 DIAGNOSIS — S90822A Blister (nonthermal), left foot, initial encounter: Secondary | ICD-10-CM

## 2017-01-30 MED ORDER — MUPIROCIN 2 % EX OINT: TOPICAL_OINTMENT | CUTANEOUS | 2 refills | Status: DC

## 2017-01-30 NOTE — Progress Notes (Signed)
He presents today for follow-up of the blister to the plantar aspect of his left foot. He states that is doing much better he states it is still a little bit sensitive but is doing a lot better and has improved with the antibiotics.  Objective: Vital signs are stable he is alert and oriented 3. Large blister to the plantar aspect of the left foot with cellulitis has resolved almost completely with 2 small areas of erythema the do not appear to be cellulitic at this point.  Assessment: Resolving cellulitis and a bolus to the plantar aspect of the left foot.  Plan: I recommended he continue his antibiotics which she would rate refill. I will follow-up with him in 2-3 weeks if he is not improved.

## 2017-01-31 DIAGNOSIS — N400 Enlarged prostate without lower urinary tract symptoms: Secondary | ICD-10-CM | POA: Diagnosis not present

## 2017-01-31 DIAGNOSIS — J45909 Unspecified asthma, uncomplicated: Secondary | ICD-10-CM | POA: Diagnosis not present

## 2017-01-31 DIAGNOSIS — G14 Postpolio syndrome: Secondary | ICD-10-CM | POA: Diagnosis not present

## 2017-01-31 DIAGNOSIS — Z0000 Encounter for general adult medical examination without abnormal findings: Secondary | ICD-10-CM | POA: Diagnosis not present

## 2017-01-31 DIAGNOSIS — D696 Thrombocytopenia, unspecified: Secondary | ICD-10-CM | POA: Diagnosis not present

## 2017-01-31 DIAGNOSIS — E8589 Other amyloidosis: Secondary | ICD-10-CM | POA: Diagnosis not present

## 2017-01-31 DIAGNOSIS — K219 Gastro-esophageal reflux disease without esophagitis: Secondary | ICD-10-CM | POA: Diagnosis not present

## 2017-02-05 DIAGNOSIS — J301 Allergic rhinitis due to pollen: Secondary | ICD-10-CM | POA: Diagnosis not present

## 2017-02-05 DIAGNOSIS — J3089 Other allergic rhinitis: Secondary | ICD-10-CM | POA: Diagnosis not present

## 2017-02-12 DIAGNOSIS — J3089 Other allergic rhinitis: Secondary | ICD-10-CM | POA: Diagnosis not present

## 2017-02-12 DIAGNOSIS — J301 Allergic rhinitis due to pollen: Secondary | ICD-10-CM | POA: Diagnosis not present

## 2017-02-18 ENCOUNTER — Ambulatory Visit: Payer: PPO | Admitting: Podiatry

## 2017-02-20 ENCOUNTER — Encounter: Payer: Self-pay | Admitting: Podiatry

## 2017-02-20 ENCOUNTER — Ambulatory Visit (INDEPENDENT_AMBULATORY_CARE_PROVIDER_SITE_OTHER): Payer: PPO | Admitting: Podiatry

## 2017-02-20 DIAGNOSIS — T148XXA Other injury of unspecified body region, initial encounter: Secondary | ICD-10-CM

## 2017-02-20 DIAGNOSIS — M79676 Pain in unspecified toe(s): Secondary | ICD-10-CM | POA: Diagnosis not present

## 2017-02-20 DIAGNOSIS — B351 Tinea unguium: Secondary | ICD-10-CM | POA: Diagnosis not present

## 2017-02-20 DIAGNOSIS — L089 Local infection of the skin and subcutaneous tissue, unspecified: Secondary | ICD-10-CM

## 2017-02-21 DIAGNOSIS — J301 Allergic rhinitis due to pollen: Secondary | ICD-10-CM | POA: Diagnosis not present

## 2017-02-21 DIAGNOSIS — J3089 Other allergic rhinitis: Secondary | ICD-10-CM | POA: Diagnosis not present

## 2017-02-21 NOTE — Progress Notes (Signed)
He presents today for follow-up of a blister to the plantar aspect of his right foot. He states it is doing much better his Petra Kuba present at all now. He is concerned about infection. He is also worried about his painful elongated toenails.  Objective: Vital signs are stable he is alert and oriented 3. Blister to the plantar aspect of the foot is gone on to heal uneventfully pulses remain palpable. No open lesions or wounds. Toenails are long thick yellow dystrophic onychomycotic painful palpation with subungual debris.  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  Plan: Debridement of toenails 1 through 5 bilateral.

## 2017-02-26 DIAGNOSIS — J301 Allergic rhinitis due to pollen: Secondary | ICD-10-CM | POA: Diagnosis not present

## 2017-02-26 DIAGNOSIS — J3089 Other allergic rhinitis: Secondary | ICD-10-CM | POA: Diagnosis not present

## 2017-03-05 DIAGNOSIS — J3089 Other allergic rhinitis: Secondary | ICD-10-CM | POA: Diagnosis not present

## 2017-03-05 DIAGNOSIS — J301 Allergic rhinitis due to pollen: Secondary | ICD-10-CM | POA: Diagnosis not present

## 2017-03-14 DIAGNOSIS — J301 Allergic rhinitis due to pollen: Secondary | ICD-10-CM | POA: Diagnosis not present

## 2017-03-14 DIAGNOSIS — J3089 Other allergic rhinitis: Secondary | ICD-10-CM | POA: Diagnosis not present

## 2017-03-19 DIAGNOSIS — J3089 Other allergic rhinitis: Secondary | ICD-10-CM | POA: Diagnosis not present

## 2017-03-19 DIAGNOSIS — J301 Allergic rhinitis due to pollen: Secondary | ICD-10-CM | POA: Diagnosis not present

## 2017-03-26 DIAGNOSIS — J301 Allergic rhinitis due to pollen: Secondary | ICD-10-CM | POA: Diagnosis not present

## 2017-03-26 DIAGNOSIS — J3089 Other allergic rhinitis: Secondary | ICD-10-CM | POA: Diagnosis not present

## 2017-04-02 DIAGNOSIS — J301 Allergic rhinitis due to pollen: Secondary | ICD-10-CM | POA: Diagnosis not present

## 2017-04-02 DIAGNOSIS — J3089 Other allergic rhinitis: Secondary | ICD-10-CM | POA: Diagnosis not present

## 2017-04-09 DIAGNOSIS — J3089 Other allergic rhinitis: Secondary | ICD-10-CM | POA: Diagnosis not present

## 2017-04-09 DIAGNOSIS — J301 Allergic rhinitis due to pollen: Secondary | ICD-10-CM | POA: Diagnosis not present

## 2017-04-10 DIAGNOSIS — G14 Postpolio syndrome: Secondary | ICD-10-CM | POA: Diagnosis not present

## 2017-04-10 DIAGNOSIS — J301 Allergic rhinitis due to pollen: Secondary | ICD-10-CM | POA: Diagnosis not present

## 2017-04-10 DIAGNOSIS — K219 Gastro-esophageal reflux disease without esophagitis: Secondary | ICD-10-CM | POA: Diagnosis not present

## 2017-04-10 DIAGNOSIS — J019 Acute sinusitis, unspecified: Secondary | ICD-10-CM | POA: Diagnosis not present

## 2017-04-10 DIAGNOSIS — D696 Thrombocytopenia, unspecified: Secondary | ICD-10-CM | POA: Diagnosis not present

## 2017-04-10 DIAGNOSIS — E8589 Other amyloidosis: Secondary | ICD-10-CM | POA: Diagnosis not present

## 2017-04-10 DIAGNOSIS — J45909 Unspecified asthma, uncomplicated: Secondary | ICD-10-CM | POA: Diagnosis not present

## 2017-04-16 DIAGNOSIS — J301 Allergic rhinitis due to pollen: Secondary | ICD-10-CM | POA: Diagnosis not present

## 2017-04-16 DIAGNOSIS — J3089 Other allergic rhinitis: Secondary | ICD-10-CM | POA: Diagnosis not present

## 2017-04-23 DIAGNOSIS — J3089 Other allergic rhinitis: Secondary | ICD-10-CM | POA: Diagnosis not present

## 2017-04-23 DIAGNOSIS — J301 Allergic rhinitis due to pollen: Secondary | ICD-10-CM | POA: Diagnosis not present

## 2017-04-30 DIAGNOSIS — J301 Allergic rhinitis due to pollen: Secondary | ICD-10-CM | POA: Diagnosis not present

## 2017-04-30 DIAGNOSIS — J3089 Other allergic rhinitis: Secondary | ICD-10-CM | POA: Diagnosis not present

## 2017-05-07 DIAGNOSIS — J3089 Other allergic rhinitis: Secondary | ICD-10-CM | POA: Diagnosis not present

## 2017-05-07 DIAGNOSIS — J301 Allergic rhinitis due to pollen: Secondary | ICD-10-CM | POA: Diagnosis not present

## 2017-05-14 DIAGNOSIS — J301 Allergic rhinitis due to pollen: Secondary | ICD-10-CM | POA: Diagnosis not present

## 2017-05-14 DIAGNOSIS — J3089 Other allergic rhinitis: Secondary | ICD-10-CM | POA: Diagnosis not present

## 2017-05-20 ENCOUNTER — Ambulatory Visit (INDEPENDENT_AMBULATORY_CARE_PROVIDER_SITE_OTHER): Payer: PPO | Admitting: Podiatry

## 2017-05-20 DIAGNOSIS — B351 Tinea unguium: Secondary | ICD-10-CM

## 2017-05-20 DIAGNOSIS — M79676 Pain in unspecified toe(s): Secondary | ICD-10-CM

## 2017-05-20 DIAGNOSIS — M129 Arthropathy, unspecified: Secondary | ICD-10-CM

## 2017-05-20 NOTE — Progress Notes (Signed)
Complaint:  Visit Type: Patient returns to my office for continued preventative foot care services. Complaint: Patient states" my nails have grown long and thick and become painful to walk and wear shoes" . The patient presents for preventative foot care services. No changes to ROS  Podiatric Exam: Vascular: dorsalis pedis and posterior tibial pulses are palpable bilateral. Capillary return is immediate. Temperature gradient is WNL. Skin turgor WNL  Sensorium: Normal Semmes Weinstein monofilament test. Normal tactile sensation bilaterally. Nail Exam: Pt has thick disfigured discolored nails with subungual debris noted bilateral entire nail hallux through fifth toenails Ulcer Exam: There is no evidence of ulcer or pre-ulcerative changes or infection. Orthopedic Exam: Muscle tone and strength are WNL. No limitations in general ROM. No crepitus or effusions noted. Foot type and digits show no abnormalities. Bony prominences are unremarkable. Skin: No Porokeratosis. No infection or ulcers  Diagnosis:  Onychomycosis, , Pain in right toe, pain in left toes  Treatment & Plan Procedures and Treatment: Consent by patient was obtained for treatment procedures. The patient understood the discussion of treatment and procedures well. All questions were answered thoroughly reviewed. Debridement of mycotic and hypertrophic toenails, 1 through 5 bilateral and clearing of subungual debris. No ulceration, no infection noted.  Return Visit-Office Procedure: Patient instructed to return to the office for a follow up visit 3 months   for continued evaluation and treatment.    Lisamarie Coke DPM 

## 2017-05-21 DIAGNOSIS — J301 Allergic rhinitis due to pollen: Secondary | ICD-10-CM | POA: Diagnosis not present

## 2017-05-21 DIAGNOSIS — J3089 Other allergic rhinitis: Secondary | ICD-10-CM | POA: Diagnosis not present

## 2017-05-28 DIAGNOSIS — H2513 Age-related nuclear cataract, bilateral: Secondary | ICD-10-CM | POA: Diagnosis not present

## 2017-05-28 DIAGNOSIS — J3089 Other allergic rhinitis: Secondary | ICD-10-CM | POA: Diagnosis not present

## 2017-05-28 DIAGNOSIS — J301 Allergic rhinitis due to pollen: Secondary | ICD-10-CM | POA: Diagnosis not present

## 2017-05-30 DIAGNOSIS — J3089 Other allergic rhinitis: Secondary | ICD-10-CM | POA: Diagnosis not present

## 2017-05-30 DIAGNOSIS — J301 Allergic rhinitis due to pollen: Secondary | ICD-10-CM | POA: Diagnosis not present

## 2017-06-02 ENCOUNTER — Telehealth: Payer: Self-pay | Admitting: Oncology

## 2017-06-02 NOTE — Telephone Encounter (Signed)
Called pt to advise of appt chg from 7/19 md pal to 8/13 @ 12pm. Pt's wife answered and says [place] and can't take the msg at this time. I made pt's wife aware that 7/19 appt has been cx'd and I will ask a scheduler to call them back on Monday to confirm 8/13 appt.

## 2017-06-04 DIAGNOSIS — J301 Allergic rhinitis due to pollen: Secondary | ICD-10-CM | POA: Diagnosis not present

## 2017-06-04 DIAGNOSIS — J3089 Other allergic rhinitis: Secondary | ICD-10-CM | POA: Diagnosis not present

## 2017-06-04 DIAGNOSIS — M1611 Unilateral primary osteoarthritis, right hip: Secondary | ICD-10-CM | POA: Diagnosis not present

## 2017-06-06 DIAGNOSIS — J301 Allergic rhinitis due to pollen: Secondary | ICD-10-CM | POA: Diagnosis not present

## 2017-06-06 DIAGNOSIS — J3089 Other allergic rhinitis: Secondary | ICD-10-CM | POA: Diagnosis not present

## 2017-06-07 DIAGNOSIS — Z1211 Encounter for screening for malignant neoplasm of colon: Secondary | ICD-10-CM | POA: Diagnosis not present

## 2017-06-07 DIAGNOSIS — D696 Thrombocytopenia, unspecified: Secondary | ICD-10-CM | POA: Diagnosis not present

## 2017-06-07 DIAGNOSIS — Z8371 Family history of colonic polyps: Secondary | ICD-10-CM | POA: Diagnosis not present

## 2017-06-11 DIAGNOSIS — J3089 Other allergic rhinitis: Secondary | ICD-10-CM | POA: Diagnosis not present

## 2017-06-11 DIAGNOSIS — J301 Allergic rhinitis due to pollen: Secondary | ICD-10-CM | POA: Diagnosis not present

## 2017-06-13 DIAGNOSIS — J3089 Other allergic rhinitis: Secondary | ICD-10-CM | POA: Diagnosis not present

## 2017-06-13 DIAGNOSIS — J301 Allergic rhinitis due to pollen: Secondary | ICD-10-CM | POA: Diagnosis not present

## 2017-06-18 DIAGNOSIS — J301 Allergic rhinitis due to pollen: Secondary | ICD-10-CM | POA: Diagnosis not present

## 2017-06-18 DIAGNOSIS — J3089 Other allergic rhinitis: Secondary | ICD-10-CM | POA: Diagnosis not present

## 2017-06-25 DIAGNOSIS — J301 Allergic rhinitis due to pollen: Secondary | ICD-10-CM | POA: Diagnosis not present

## 2017-06-25 DIAGNOSIS — J3089 Other allergic rhinitis: Secondary | ICD-10-CM | POA: Diagnosis not present

## 2017-06-27 ENCOUNTER — Other Ambulatory Visit: Payer: PPO

## 2017-06-27 ENCOUNTER — Ambulatory Visit: Payer: PPO | Admitting: Oncology

## 2017-06-27 DIAGNOSIS — K219 Gastro-esophageal reflux disease without esophagitis: Secondary | ICD-10-CM | POA: Diagnosis not present

## 2017-06-27 DIAGNOSIS — D696 Thrombocytopenia, unspecified: Secondary | ICD-10-CM | POA: Diagnosis not present

## 2017-06-27 DIAGNOSIS — E8589 Other amyloidosis: Secondary | ICD-10-CM | POA: Diagnosis not present

## 2017-06-27 DIAGNOSIS — G14 Postpolio syndrome: Secondary | ICD-10-CM | POA: Diagnosis not present

## 2017-06-27 DIAGNOSIS — H18419 Arcus senilis, unspecified eye: Secondary | ICD-10-CM | POA: Diagnosis not present

## 2017-06-27 DIAGNOSIS — N400 Enlarged prostate without lower urinary tract symptoms: Secondary | ICD-10-CM | POA: Diagnosis not present

## 2017-06-27 DIAGNOSIS — J45909 Unspecified asthma, uncomplicated: Secondary | ICD-10-CM | POA: Diagnosis not present

## 2017-07-02 DIAGNOSIS — J301 Allergic rhinitis due to pollen: Secondary | ICD-10-CM | POA: Diagnosis not present

## 2017-07-02 DIAGNOSIS — J3089 Other allergic rhinitis: Secondary | ICD-10-CM | POA: Diagnosis not present

## 2017-07-04 DIAGNOSIS — K219 Gastro-esophageal reflux disease without esophagitis: Secondary | ICD-10-CM | POA: Diagnosis not present

## 2017-07-04 DIAGNOSIS — K5900 Constipation, unspecified: Secondary | ICD-10-CM | POA: Diagnosis not present

## 2017-07-04 DIAGNOSIS — G14 Postpolio syndrome: Secondary | ICD-10-CM | POA: Diagnosis not present

## 2017-07-04 DIAGNOSIS — D696 Thrombocytopenia, unspecified: Secondary | ICD-10-CM | POA: Diagnosis not present

## 2017-07-04 DIAGNOSIS — Z0000 Encounter for general adult medical examination without abnormal findings: Secondary | ICD-10-CM | POA: Diagnosis not present

## 2017-07-04 DIAGNOSIS — E8589 Other amyloidosis: Secondary | ICD-10-CM | POA: Diagnosis not present

## 2017-07-04 DIAGNOSIS — J45909 Unspecified asthma, uncomplicated: Secondary | ICD-10-CM | POA: Diagnosis not present

## 2017-07-11 DIAGNOSIS — J301 Allergic rhinitis due to pollen: Secondary | ICD-10-CM | POA: Diagnosis not present

## 2017-07-11 DIAGNOSIS — J3089 Other allergic rhinitis: Secondary | ICD-10-CM | POA: Diagnosis not present

## 2017-07-16 DIAGNOSIS — J301 Allergic rhinitis due to pollen: Secondary | ICD-10-CM | POA: Diagnosis not present

## 2017-07-16 DIAGNOSIS — J3089 Other allergic rhinitis: Secondary | ICD-10-CM | POA: Diagnosis not present

## 2017-07-22 ENCOUNTER — Ambulatory Visit (HOSPITAL_BASED_OUTPATIENT_CLINIC_OR_DEPARTMENT_OTHER): Payer: PPO | Admitting: Oncology

## 2017-07-22 ENCOUNTER — Other Ambulatory Visit (HOSPITAL_BASED_OUTPATIENT_CLINIC_OR_DEPARTMENT_OTHER): Payer: PPO

## 2017-07-22 ENCOUNTER — Telehealth: Payer: Self-pay | Admitting: Oncology

## 2017-07-22 VITALS — BP 143/73 | HR 78 | Temp 98.8°F | Resp 17 | Ht 66.0 in | Wt 174.2 lb

## 2017-07-22 DIAGNOSIS — I429 Cardiomyopathy, unspecified: Secondary | ICD-10-CM

## 2017-07-22 DIAGNOSIS — D696 Thrombocytopenia, unspecified: Secondary | ICD-10-CM

## 2017-07-22 DIAGNOSIS — R2991 Unspecified symptoms and signs involving the musculoskeletal system: Secondary | ICD-10-CM | POA: Diagnosis not present

## 2017-07-22 DIAGNOSIS — E851 Neuropathic heredofamilial amyloidosis: Secondary | ICD-10-CM | POA: Diagnosis not present

## 2017-07-22 DIAGNOSIS — E8581 Light chain (AL) amyloidosis: Secondary | ICD-10-CM | POA: Diagnosis not present

## 2017-07-22 LAB — CBC WITH DIFFERENTIAL/PLATELET
BASO%: 0.6 % (ref 0.0–2.0)
BASOS ABS: 0 10*3/uL (ref 0.0–0.1)
EOS%: 2.5 % (ref 0.0–7.0)
Eosinophils Absolute: 0.1 10*3/uL (ref 0.0–0.5)
HEMATOCRIT: 40.5 % (ref 38.4–49.9)
HGB: 14.1 g/dL (ref 13.0–17.1)
LYMPH#: 1.4 10*3/uL (ref 0.9–3.3)
LYMPH%: 25.1 % (ref 14.0–49.0)
MCH: 32 pg (ref 27.2–33.4)
MCHC: 34.9 g/dL (ref 32.0–36.0)
MCV: 91.6 fL (ref 79.3–98.0)
MONO#: 0.6 10*3/uL (ref 0.1–0.9)
MONO%: 11.8 % (ref 0.0–14.0)
NEUT#: 3.3 10*3/uL (ref 1.5–6.5)
NEUT%: 60 % (ref 39.0–75.0)
PLATELETS: 121 10*3/uL — AB (ref 140–400)
RBC: 4.42 10*6/uL (ref 4.20–5.82)
RDW: 13.9 % (ref 11.0–14.6)
WBC: 5.5 10*3/uL (ref 4.0–10.3)

## 2017-07-22 LAB — COMPREHENSIVE METABOLIC PANEL
ALK PHOS: 47 U/L (ref 40–150)
ALT: 35 U/L (ref 0–55)
ANION GAP: 9 meq/L (ref 3–11)
AST: 33 U/L (ref 5–34)
Albumin: 4.1 g/dL (ref 3.5–5.0)
BILIRUBIN TOTAL: 0.81 mg/dL (ref 0.20–1.20)
BUN: 22.2 mg/dL (ref 7.0–26.0)
CALCIUM: 9.4 mg/dL (ref 8.4–10.4)
CHLORIDE: 104 meq/L (ref 98–109)
CO2: 28 mEq/L (ref 22–29)
Creatinine: 1.1 mg/dL (ref 0.7–1.3)
EGFR: 70 mL/min/{1.73_m2} — ABNORMAL LOW (ref >90)
Glucose: 97 mg/dl (ref 70–140)
Potassium: 4 mEq/L (ref 3.5–5.1)
Sodium: 140 mEq/L (ref 136–145)
TOTAL PROTEIN: 6.9 g/dL (ref 6.4–8.3)

## 2017-07-22 NOTE — Telephone Encounter (Signed)
Scheduled appt per 8/13 los - Gave patient AVS and calender per los.  

## 2017-07-22 NOTE — Progress Notes (Signed)
  [facility] OFFICE PROGRESS NOTE   Diagnosis: Amyloidosis  INTERVAL HISTORY:   Eugene Martinez returns as scheduled. He feels well. No recent infection. He has intermittent discomfort in the right upper medial thigh. No palpable mass. The discomfort is worse with weightbearing.  Objective:  Vital signs in last 24 hours:  Blood pressure (!) 143/73, pulse 78, temperature 98.8 F (37.1 C), temperature source Oral, resp. rate 17, height 5\' 6"  (1.676 m), weight 174 lb 3.2 oz (79 kg), SpO2 97 %.    Lymphatics: No inguinal or femoral nodes Resp: Lungs clear bilaterally Cardio: Regular rate and rhythm GI: No hepatosplenomegaly Vascular: No leg edema Musculoskeletal: Right thigh without tenderness, mass, or erythema. No pain with motion at the right hip     [place]  Lab Results:  Lab Results  Component Value Date   WBC 5.5 07/22/2017   HGB 14.1 07/22/2017   HCT 40.5 07/22/2017   MCV 91.6 07/22/2017   PLT 121 (L) 07/22/2017   NEUTROABS 3.3 07/22/2017    CMP     Component Value Date/Time   NA 140 07/22/2017 1227   K 4.0 07/22/2017 1227   CL 100 05/22/2013 0922   CO2 28 07/22/2017 1227   GLUCOSE 97 07/22/2017 1227   GLUCOSE 84 05/22/2013 0922   BUN 22.2 07/22/2017 1227   CREATININE 1.1 07/22/2017 1227   CALCIUM 9.4 07/22/2017 1227   PROT 6.9 07/22/2017 1227   ALBUMIN 4.1 07/22/2017 1227   AST 33 07/22/2017 1227   ALT 35 07/22/2017 1227   ALKPHOS 47 07/22/2017 1227   BILITOT 0.81 07/22/2017 1227   GFRNONAA 72 (L) 01/24/2013 0500   GFRAA 83 (L) 01/24/2013 0500  24-hour urine protein 12/27/2016-122 milligrams  Medications: I have reviewed the patient's current medications.  Assessment/Plan: 1. 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 Followed by cardiology, echocardiogram September 2015 with an LVEF of 65-70% and severe LVH 4. 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 Zoster rash December 2016    Disposition:  Eugene Martinez appears unchanged. No clinical evidence for progression of the amyloidosis. We will follow-up on the 24-hour urine protein from today. He will return for an office and lab visit in 6 months. I suspect the right upper thigh discomfort is related to a benign musculoskeletal condition. He will contact us if the discomfort worsens.  15 minutes were spent with the patient today. The majority of the time was used for counseling and coordination of care.  Donneta Romberg, MD  07/22/2017  1:58 PM

## 2017-07-23 DIAGNOSIS — J3089 Other allergic rhinitis: Secondary | ICD-10-CM | POA: Diagnosis not present

## 2017-07-23 DIAGNOSIS — J301 Allergic rhinitis due to pollen: Secondary | ICD-10-CM | POA: Diagnosis not present

## 2017-07-23 LAB — KAPPA/LAMBDA LIGHT CHAINS
IG LAMBDA FREE LIGHT CHAIN: 17.4 mg/L (ref 5.7–26.3)
Ig Kappa Free Light Chain: 17 mg/L (ref 3.3–19.4)
KAPPA/LAMBDA FLC RATIO: 0.98 (ref 0.26–1.65)

## 2017-07-23 LAB — IGG: IGG (IMMUNOGLOBIN G), SERUM: 879 mg/dL (ref 700–1600)

## 2017-07-24 LAB — UPEP/UIFE/LIGHT CHAINS/TP, 24-HR UR
% BETA, Urine: 0 %
ALBUMIN, U: 0 %
ALPHA 1 URINE: 0 %
ALPHA-2-GLOBULIN, U: 0 %
FREE KAPPA LT CHAINS, UR: 1.05 mg/L — AB (ref 1.35–24.19)
Free Lambda Lt Chains,Ur: 1.2 mg/L (ref 0.24–6.66)
GAMMA GLOBULIN URINE: 0 %
Kappa/Lambda Ratio,U: 0.88 — ABNORMAL LOW (ref 2.04–10.37)
Prot,24hr calculated: <128 mg/24 hr (ref 30–150)

## 2017-07-27 ENCOUNTER — Other Ambulatory Visit: Payer: Self-pay | Admitting: Oncology

## 2017-07-30 ENCOUNTER — Other Ambulatory Visit: Payer: Self-pay | Admitting: Oncology

## 2017-07-30 DIAGNOSIS — J301 Allergic rhinitis due to pollen: Secondary | ICD-10-CM | POA: Diagnosis not present

## 2017-07-30 DIAGNOSIS — J3089 Other allergic rhinitis: Secondary | ICD-10-CM | POA: Diagnosis not present

## 2017-07-30 MED ORDER — TORSEMIDE 20 MG PO TABS: 20.0000 mg | ORAL_TABLET | Freq: Two times a day (BID) | ORAL | 0 refills | Status: DC

## 2017-08-06 DIAGNOSIS — J3089 Other allergic rhinitis: Secondary | ICD-10-CM | POA: Diagnosis not present

## 2017-08-06 DIAGNOSIS — J301 Allergic rhinitis due to pollen: Secondary | ICD-10-CM | POA: Diagnosis not present

## 2017-08-13 DIAGNOSIS — J301 Allergic rhinitis due to pollen: Secondary | ICD-10-CM | POA: Diagnosis not present

## 2017-08-13 DIAGNOSIS — J3089 Other allergic rhinitis: Secondary | ICD-10-CM | POA: Diagnosis not present

## 2017-08-20 DIAGNOSIS — J3089 Other allergic rhinitis: Secondary | ICD-10-CM | POA: Diagnosis not present

## 2017-08-20 DIAGNOSIS — J301 Allergic rhinitis due to pollen: Secondary | ICD-10-CM | POA: Diagnosis not present

## 2017-08-26 ENCOUNTER — Ambulatory Visit (INDEPENDENT_AMBULATORY_CARE_PROVIDER_SITE_OTHER): Payer: PPO | Admitting: Podiatry

## 2017-08-26 ENCOUNTER — Encounter: Payer: Self-pay | Admitting: Podiatry

## 2017-08-26 DIAGNOSIS — M79676 Pain in unspecified toe(s): Secondary | ICD-10-CM

## 2017-08-26 DIAGNOSIS — B351 Tinea unguium: Secondary | ICD-10-CM | POA: Diagnosis not present

## 2017-08-26 NOTE — Progress Notes (Signed)
Complaint:  Visit Type: Patient returns to my office for continued preventative foot care services. Complaint: Patient states" my nails have grown long and thick and become painful to walk and wear shoes" . The patient presents for preventative foot care services. No changes to ROS  Podiatric Exam: Vascular: dorsalis pedis and posterior tibial pulses are palpable bilateral. Capillary return is immediate. Temperature gradient is WNL. Skin turgor WNL  Sensorium: Normal Semmes Weinstein monofilament test. Normal tactile sensation bilaterally. Nail Exam: Pt has thick disfigured discolored nails with subungual debris noted bilateral entire nail hallux through fifth toenails Ulcer Exam: There is no evidence of ulcer or pre-ulcerative changes or infection. Orthopedic Exam: Muscle tone and strength are WNL. No limitations in general ROM. No crepitus or effusions noted. Foot type and digits show no abnormalities. Bony prominences are unremarkable. Skin: No Porokeratosis. No infection or ulcers  Diagnosis:  Onychomycosis, , Pain in right toe, pain in left toes  Treatment & Plan Procedures and Treatment: Consent by patient was obtained for treatment procedures. The patient understood the discussion of treatment and procedures well. All questions were answered thoroughly reviewed. Debridement of mycotic and hypertrophic toenails, 1 through 5 bilateral and clearing of subungual debris. No ulceration, no infection noted.  Return Visit-Office Procedure: Patient instructed to return to the office for a follow up visit 3 months   for continued evaluation and treatment.    Kahlie Deutscher DPM 

## 2017-08-27 ENCOUNTER — Other Ambulatory Visit: Payer: Self-pay | Admitting: *Deleted

## 2017-08-27 DIAGNOSIS — J3089 Other allergic rhinitis: Secondary | ICD-10-CM | POA: Diagnosis not present

## 2017-08-27 DIAGNOSIS — J301 Allergic rhinitis due to pollen: Secondary | ICD-10-CM | POA: Diagnosis not present

## 2017-08-27 MED ORDER — TORSEMIDE 20 MG PO TABS: 20.0000 mg | ORAL_TABLET | Freq: Two times a day (BID) | ORAL | 0 refills | Status: DC

## 2017-08-31 NOTE — Progress Notes (Signed)
Cardiology Office Note  Date:  09/02/2017   ID:  Eugene Martinez, Eugene Martinez 1946-05-07, MRN [id number]  PCP:  Tracie Harrier, MD   Chief Complaint  Patient presents with  . other    12 month follow up. "doing well."     HPI:  71 year old gentleman With history of  AL Amyloidosis, previous chemo, f/u 6 mon, in remission since 11/2013 severe LVH on echo and MRI,  exertional syncope who presents for routine follow-up of his severe LVH, amyloid disease  Feels well,denies any shortness of breath, leg swelling, chest pain No regular exercise program No near syncope or syncope No palpitations concerning for arrhythmia He does have torsemide but has not had to take this recently  He does wear a leg brace on his right lower leg.  Lab work reviewed in detail with him on today's visit Total chol 151, LDL 66    EKG on today's visit shows normal sinus rhythm with rate 84 bpm, right bundle branch block, LVH, no change from previous EKG    other past medical history September 2015 hospitalized at [facility] after experiencing exertional syncope while walking up steps    echocardiogram with  significant left ventricular hypertrophy and a speckling of the myocardium   Systolic function was normal with ejection fraction of 55-60% and the left atrium was moderately dilated.  cardiac MRI 2013 Severe LVH, Left ventricular wall motion was normal with an ejection fraction of 66%. mild right ventricular hypertrophy and normal right atrial size and mild to moderate mitral regurgitation.  Rarely takes Lasix as needed if his weight increased or if he had unusual shortness of breath.  urine immunoelectrophoresis   light chain Bence-Jones protein in the urine.  fat pad biopsy which was not diagnostic for myeloma or amyloidosis and a bone marrow biopsy which was not diagnostic.  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  completed chemotherapy for his amyloidosis and  has been in remission since December 2014. He has had previous harvest of stem cells down at [facility] but has not had a stem cell transplant.  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  echocardiogram September 2015 with an LVEF of 65-70% and severe LVH 4. 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 Zoster rash December 2016  PMH:   has a past medical history of Hypertension; Mild mitral regurgitation; and Syncope.  PSH:    Past Surgical History:  Procedure Laterality Date  . FOOT SURGERY     right foot    Current Outpatient Prescriptions  Medication Sig Dispense Refill  . aspirin 81 MG chewable tablet Chew 81 mg by mouth daily.    Marland Kitchen azelastine (ASTELIN) 137 MCG/SPRAY nasal spray Place 1-2 sprays into both nostrils every 12 (twelve) hours as needed (allergies).     . cholecalciferol (VITAMIN D) 1000 UNITS tablet Take 1,000 Units by mouth at bedtime.     . ferrous sulfate 325 (65 FE) MG tablet Take 325 mg by mouth daily.  3  . finasteride (PROSCAR) 5 MG tablet Take 5 mg by mouth every morning.     Marland Kitchen levocetirizine (XYZAL) 5 MG tablet Take 5 mg by mouth at bedtime. To relieve allergy  symptoms    . montelukast (SINGULAIR) 10 MG tablet Take 10 mg by mouth at bedtime.    . Multiple Vitamins-Minerals (CENTRUM SILVER ULTRA MENS) TABS Take 1 tablet by mouth every morning.     . Omega-3 Fatty Acids (FISH OIL) 1200 MG CAPS Take 1,200 mg by mouth 2 (two) times daily.     Marland Kitchen omeprazole (PRILOSEC) 20 MG capsule Take 20 mg by mouth daily before breakfast. Takes 30 minutes before breakfast for acid reflux    . senna-docusate (SENOKOT-S) 8.6-50 MG tablet Take 1  tablet by mouth 2 (two) times daily.    Marland Kitchen torsemide (DEMADEX) 20 MG tablet Take 1 tablet (20 mg total) by mouth 2 (two) times daily. TAKE 1 TABLET BY MOUTH TWO TIMES DAILY. TAKE IF WT. INCREASES 2-5 LBS 60 tablet 0  . triamcinolone cream (KENALOG) 0.1 % Apply 1 application topically daily as needed (skin rash).   2  . vitamin E 400 UNIT capsule Take 400 Units by mouth every morning.      No current facility-administered medications for this visit.      Allergies:   Other   Social History:  The patient  reports that he has never smoked. He has never used smokeless tobacco. He reports that he does not drink alcohol or use drugs.   Family History:   family history includes Heart attack in his father.    Review of Systems: Review of Systems  Constitutional: Negative.   Respiratory: Negative.   Cardiovascular: Negative.   Gastrointestinal: Negative.   Musculoskeletal: Positive for joint pain.  Neurological: Negative.   Psychiatric/Behavioral: Negative.   All other systems reviewed and are negative. no change from last office visit   PHYSICAL EXAM: VS:  BP 140/78 (BP Location: Left Arm, Patient Position: Sitting, Cuff Size: Normal)   Pulse 84   Ht '5\' 6"'  (1.676 m)   Wt 175 lb 8 oz (79.6 kg)   BMI 28.33 kg/m  , BMI Body mass index is 28.33 kg/m. GEN: Well nourished, well developed, in no acute distress  HEENT: normal  Neck: no JVD, carotid bruits, or masses Cardiac: RRR; 2/^ sem Lsb, no rubs, +gallops, no edema  Respiratory:  clear to auscultation bilaterally, normal work of breathing GI: soft, nontender, nondistended, + BS MS: no deformity or atrophy , leg brace right leg Skin: warm and dry, no rash Neuro:  Strength and sensation are intact,  Psych: euthymic mood, full affect    Recent Labs: 07/22/2017: ALT 35; BUN 22.2; Creatinine 1.1; HGB 14.1; Platelets 121; Potassium 4.0; Sodium 140    Lipid Panel Lab Results  Component Value Date   CHOL 209 (H) 10/31/2012   HDL  37 (L) 10/31/2012   LDLCALC 148 (H) 10/31/2012   TRIG 118 10/31/2012      Wt Readings from Last 3 Encounters:  09/02/17 175 lb 8 oz (79.6 kg)  07/22/17 174 lb 3.2 oz (79 kg)  12/27/16 168 lb 14.4 oz (76.6 kg)       ASSESSMENT AND PLAN:  Essential hypertension - Plan: EKG 12-Lead Blood pressure stable, no medication changes  Syncope, unspecified syncope type Denies any near-syncope or syncope discussed his severe LVH anatomy with him, high risk of further events in the future.   AL amyloidosis (HCC) Reports symptoms stable,  Previous chemotherapy, stable  LVH (left ventricular hypertrophy) As above, severe LVH on echocardiogram and MRI  Hyperlipidemia Cholesterol down to 150 with weight loss   Total encounter time more than 25 minutes  Greater than 50% was spent in counseling and coordination of care with the patient   Disposition:   F/U  12 months   No orders of the defined types were placed in this encounter.    Signed, Esmond Plants, M.D., Ph.D. 09/02/2017  [place], [place]

## 2017-09-02 ENCOUNTER — Ambulatory Visit (INDEPENDENT_AMBULATORY_CARE_PROVIDER_SITE_OTHER): Payer: PPO | Admitting: Cardiovascular Disease

## 2017-09-02 ENCOUNTER — Encounter: Payer: Self-pay | Admitting: Cardiovascular Disease

## 2017-09-02 VITALS — BP 140/78 | HR 84 | Ht 66.0 in | Wt 175.5 lb

## 2017-09-02 DIAGNOSIS — E8581 Light chain (AL) amyloidosis: Secondary | ICD-10-CM | POA: Diagnosis not present

## 2017-09-02 DIAGNOSIS — I517 Cardiomegaly: Secondary | ICD-10-CM | POA: Diagnosis not present

## 2017-09-02 DIAGNOSIS — I5032 Chronic diastolic (congestive) heart failure: Secondary | ICD-10-CM | POA: Diagnosis not present

## 2017-09-02 NOTE — Patient Instructions (Addendum)

## 2017-09-03 DIAGNOSIS — J3089 Other allergic rhinitis: Secondary | ICD-10-CM | POA: Diagnosis not present

## 2017-09-03 DIAGNOSIS — J301 Allergic rhinitis due to pollen: Secondary | ICD-10-CM | POA: Diagnosis not present

## 2017-09-12 DIAGNOSIS — J3089 Other allergic rhinitis: Secondary | ICD-10-CM | POA: Diagnosis not present

## 2017-09-12 DIAGNOSIS — J301 Allergic rhinitis due to pollen: Secondary | ICD-10-CM | POA: Diagnosis not present

## 2017-09-17 DIAGNOSIS — J301 Allergic rhinitis due to pollen: Secondary | ICD-10-CM | POA: Diagnosis not present

## 2017-09-17 DIAGNOSIS — J3089 Other allergic rhinitis: Secondary | ICD-10-CM | POA: Diagnosis not present

## 2017-09-24 DIAGNOSIS — J301 Allergic rhinitis due to pollen: Secondary | ICD-10-CM | POA: Diagnosis not present

## 2017-09-24 DIAGNOSIS — J3089 Other allergic rhinitis: Secondary | ICD-10-CM | POA: Diagnosis not present

## 2017-10-01 DIAGNOSIS — J301 Allergic rhinitis due to pollen: Secondary | ICD-10-CM | POA: Diagnosis not present

## 2017-10-01 DIAGNOSIS — J3089 Other allergic rhinitis: Secondary | ICD-10-CM | POA: Diagnosis not present

## 2017-10-08 DIAGNOSIS — J301 Allergic rhinitis due to pollen: Secondary | ICD-10-CM | POA: Diagnosis not present

## 2017-10-08 DIAGNOSIS — J3089 Other allergic rhinitis: Secondary | ICD-10-CM | POA: Diagnosis not present

## 2017-10-14 DIAGNOSIS — J3089 Other allergic rhinitis: Secondary | ICD-10-CM | POA: Diagnosis not present

## 2017-10-14 DIAGNOSIS — J301 Allergic rhinitis due to pollen: Secondary | ICD-10-CM | POA: Diagnosis not present

## 2017-10-15 DIAGNOSIS — J301 Allergic rhinitis due to pollen: Secondary | ICD-10-CM | POA: Diagnosis not present

## 2017-10-15 DIAGNOSIS — J3089 Other allergic rhinitis: Secondary | ICD-10-CM | POA: Diagnosis not present

## 2017-10-22 DIAGNOSIS — J3089 Other allergic rhinitis: Secondary | ICD-10-CM | POA: Diagnosis not present

## 2017-10-22 DIAGNOSIS — J301 Allergic rhinitis due to pollen: Secondary | ICD-10-CM | POA: Diagnosis not present

## 2017-10-28 ENCOUNTER — Encounter: Payer: Self-pay | Admitting: *Deleted

## 2017-10-29 ENCOUNTER — Ambulatory Visit: Payer: PPO | Admitting: Certified Registered"

## 2017-10-29 ENCOUNTER — Ambulatory Visit
Admission: RE | Admit: 2017-10-29 | Discharge: 2017-10-29 | Disposition: A | Discharge status: Home / Self Care | Payer: PPO | Source: Ambulatory Visit | Attending: Gastroenterology | Admitting: Gastroenterology

## 2017-10-29 ENCOUNTER — Encounter
Admission: RE | Disposition: A | Discharge status: Home / Self Care | Payer: Self-pay | Source: Ambulatory Visit | Attending: Gastroenterology

## 2017-10-29 ENCOUNTER — Encounter: Payer: Self-pay | Admitting: *Deleted

## 2017-10-29 DIAGNOSIS — I340 Nonrheumatic mitral (valve) insufficiency: Secondary | ICD-10-CM | POA: Insufficient documentation

## 2017-10-29 DIAGNOSIS — Z8371 Family history of colonic polyps: Secondary | ICD-10-CM | POA: Insufficient documentation

## 2017-10-29 DIAGNOSIS — I10 Essential (primary) hypertension: Secondary | ICD-10-CM | POA: Diagnosis not present

## 2017-10-29 DIAGNOSIS — E859 Amyloidosis, unspecified: Secondary | ICD-10-CM | POA: Insufficient documentation

## 2017-10-29 DIAGNOSIS — Z8612 Personal history of poliomyelitis: Secondary | ICD-10-CM | POA: Diagnosis not present

## 2017-10-29 DIAGNOSIS — J45909 Unspecified asthma, uncomplicated: Secondary | ICD-10-CM | POA: Diagnosis not present

## 2017-10-29 DIAGNOSIS — Z1211 Encounter for screening for malignant neoplasm of colon: Secondary | ICD-10-CM | POA: Diagnosis not present

## 2017-10-29 DIAGNOSIS — Z79899 Other long term (current) drug therapy: Secondary | ICD-10-CM | POA: Diagnosis not present

## 2017-10-29 DIAGNOSIS — K219 Gastro-esophageal reflux disease without esophagitis: Secondary | ICD-10-CM | POA: Diagnosis not present

## 2017-10-29 DIAGNOSIS — D122 Benign neoplasm of ascending colon: Secondary | ICD-10-CM | POA: Diagnosis not present

## 2017-10-29 DIAGNOSIS — N400 Enlarged prostate without lower urinary tract symptoms: Secondary | ICD-10-CM | POA: Diagnosis not present

## 2017-10-29 DIAGNOSIS — K5730 Diverticulosis of large intestine without perforation or abscess without bleeding: Secondary | ICD-10-CM | POA: Insufficient documentation

## 2017-10-29 DIAGNOSIS — K640 First degree hemorrhoids: Secondary | ICD-10-CM | POA: Diagnosis not present

## 2017-10-29 DIAGNOSIS — Z9109 Other allergy status, other than to drugs and biological substances: Secondary | ICD-10-CM | POA: Diagnosis not present

## 2017-10-29 DIAGNOSIS — D696 Thrombocytopenia, unspecified: Secondary | ICD-10-CM | POA: Insufficient documentation

## 2017-10-29 DIAGNOSIS — Z7982 Long term (current) use of aspirin: Secondary | ICD-10-CM | POA: Diagnosis not present

## 2017-10-29 DIAGNOSIS — K635 Polyp of colon: Secondary | ICD-10-CM | POA: Diagnosis not present

## 2017-10-29 DIAGNOSIS — K5790 Diverticulosis of intestine, part unspecified, without perforation or abscess without bleeding: Secondary | ICD-10-CM | POA: Diagnosis not present

## 2017-10-29 HISTORY — DX: Benign prostatic hyperplasia without lower urinary tract symptoms: N40.0

## 2017-10-29 HISTORY — DX: Acute poliomyelitis, unspecified: A80.9

## 2017-10-29 HISTORY — DX: Gastro-esophageal reflux disease without esophagitis: K21.9

## 2017-10-29 HISTORY — PX: COLONOSCOPY WITH PROPOFOL: SHX5780

## 2017-10-29 HISTORY — DX: Amyloidosis, unspecified: E85.9

## 2017-10-29 HISTORY — DX: Allergy, unspecified, initial encounter: T78.40XA

## 2017-10-29 LAB — PROTIME-INR
INR: 1.05
PROTHROMBIN TIME: 13.6 s (ref 11.4–15.2)

## 2017-10-29 LAB — CBC WITH DIFFERENTIAL/PLATELET
Basophils Absolute: 0 K/uL (ref 0–0.1)
Basophils Relative: 1 %
Eosinophils Absolute: 0.2 K/uL (ref 0–0.7)
Eosinophils Relative: 3 %
HCT: 39.5 % — ABNORMAL LOW (ref 40.0–52.0)
Hemoglobin: 14.1 g/dL (ref 13.0–18.0)
Lymphocytes Relative: 22 %
Lymphs Abs: 1.3 K/uL (ref 1.0–3.6)
MCH: 32.4 pg (ref 26.0–34.0)
MCHC: 35.8 g/dL (ref 32.0–36.0)
MCV: 90.5 fL (ref 80.0–100.0)
Monocytes Absolute: 0.7 K/uL (ref 0.2–1.0)
Monocytes Relative: 12 %
Neutro Abs: 3.5 K/uL (ref 1.4–6.5)
Neutrophils Relative %: 62 %
Platelets: 119 K/uL — ABNORMAL LOW (ref 150–440)
RBC: 4.37 MIL/uL — ABNORMAL LOW (ref 4.40–5.90)
RDW: 13 % (ref 11.5–14.5)
WBC: 5.7 K/uL (ref 3.8–10.6)

## 2017-10-29 SURGERY — COLONOSCOPY WITH PROPOFOL
Anesthesia: General

## 2017-10-29 MED ORDER — MIDAZOLAM HCL 2 MG/2ML IJ SOLN
INTRAMUSCULAR | Status: AC
  Filled 2017-10-29: qty 2

## 2017-10-29 MED ORDER — PROPOFOL 500 MG/50ML IV EMUL
INTRAVENOUS | Status: DC | PRN
  Administered 2017-10-29: 150 ug/kg/min via INTRAVENOUS

## 2017-10-29 MED ORDER — LIDOCAINE HCL (CARDIAC) 20 MG/ML IV SOLN
INTRAVENOUS | Status: DC | PRN
  Administered 2017-10-29: 80 mg via INTRAVENOUS

## 2017-10-29 MED ORDER — SODIUM CHLORIDE 0.9 % IV SOLN
INTRAVENOUS | Status: DC
  Administered 2017-10-29 (×2): via INTRAVENOUS

## 2017-10-29 MED ORDER — SODIUM CHLORIDE 0.9 % IV SOLN: INTRAVENOUS | Status: DC

## 2017-10-29 MED ORDER — LIDOCAINE HCL (PF) 2 % IJ SOLN
INTRAMUSCULAR | Status: AC
  Filled 2017-10-29: qty 10

## 2017-10-29 MED ORDER — PROPOFOL 10 MG/ML IV BOLUS
INTRAVENOUS | Status: DC | PRN
  Administered 2017-10-29: 50 mg via INTRAVENOUS

## 2017-10-29 MED ORDER — PROPOFOL 500 MG/50ML IV EMUL
INTRAVENOUS | Status: AC
  Filled 2017-10-29: qty 50

## 2017-10-29 NOTE — Transfer of Care (Signed)
Immediate Anesthesia Transfer of Care Note  Patient: Eugene Martinez.  Procedure(s) Performed: COLONOSCOPY WITH PROPOFOL (N/A )  Patient Location: Endoscopy Unit  Anesthesia Type:General  Level of Consciousness: drowsy and patient cooperative  Airway & Oxygen Therapy: Patient Spontanous Breathing and Patient connected to nasal cannula oxygen  Post-op Assessment: Report given to RN and Post -op Vital signs reviewed and stable  Post vital signs: Reviewed and stable  Last Vitals:  Vitals:   10/29/17 0843  BP: 139/74  Pulse: 81  Resp: 20  Temp: 36.7 C  SpO2: 100%    Last Pain:  Vitals:   10/29/17 0843  TempSrc: Tympanic         Complications: No apparent anesthesia complications

## 2017-10-29 NOTE — Anesthesia Post-op Follow-up Note (Signed)
Anesthesia QCDR form completed.        

## 2017-10-29 NOTE — Op Note (Signed)
[facility] Gastroenterology Patient Name: Eugene Martinez Procedure Date: 10/29/2017 9:43 AM MRN: [id number] Account #: [id number] Date of Birth: 04-Aug-1946 Admit Type: Outpatient Age: 71 Room: [facility] ENDO ROOM 3 Gender: Male Note Status: Finalized Procedure:            Colonoscopy Indications:          Family history of colonic polyps in a first-degree                        relative Providers:            Lollie Sails, MD Referring MD:         Tracie Harrier, MD (Referring MD) Medicines:            Monitored Anesthesia Care Complications:        No immediate complications. Procedure:            Pre-Anesthesia Assessment:                       - ASA Grade Assessment: III - A patient with severe                        systemic disease.                       After obtaining informed consent, the colonoscope was                        passed under direct vision. Throughout the procedure,                        the patient's blood pressure, pulse, and oxygen                        saturations were monitored continuously. The Olympus                        PCF-H180AL colonoscope ( S#: [id number] ) was introduced                        through the anus and advanced to the the cecum,                        identified by appendiceal orifice and ileocecal valve.                        The colonoscopy was performed without difficulty. The                        patient tolerated the procedure well. The quality of                        the bowel preparation was good. Findings:      A 1 mm polyp was found in the ascending colon. The polyp was sessile.       The polyp was removed with a cold biopsy forceps. Resection and       retrieval were complete.      A few small-mouthed diverticula were found in the sigmoid colon and       descending colon.      Non-bleeding internal hemorrhoids were  found during anoscopy. The       hemorrhoids were small and Grade I (internal  hemorrhoids that do not       prolapse).      The digital rectal exam was normal. Impression:           - One 1 mm polyp in the ascending colon, removed with a                        cold biopsy forceps. Resected and retrieved.                       - Diverticulosis in the sigmoid colon and in the                        descending colon.                       - Non-bleeding internal hemorrhoids. Recommendation:       - Discharge patient to home.                       - Repeat colonoscopy in 5 years for screening purposes. Procedure Code(s):    --- Professional ---                       [phone], Colonoscopy, flexible; with biopsy, single or                        multiple Diagnosis Code(s):    --- Professional ---                       D12.2, Benign neoplasm of ascending colon                       K64.0, First degree hemorrhoids                       Z83.71, Family history of colonic polyps                       K57.30, Diverticulosis of large intestine without                        perforation or abscess without bleeding CPT copyright 2016 American Medical Association. All rights reserved. The codes documented in this report are preliminary and upon coder review may  be revised to meet current compliance requirements. Lollie Sails, MD 10/29/2017 10:13:25 AM This report has been signed electronically. Number of Addenda: 0 Note Initiated On: 10/29/2017 9:43 AM Scope Withdrawal Time: 0 hours 7 minutes 5 seconds  Total Procedure Duration: 0 hours 20 minutes 57 seconds       [facility]

## 2017-10-29 NOTE — H&P (Signed)
Outpatient short stay form Pre-procedure 10/29/2017 9:25 AM Lollie Sails MD  Primary Physician: Dr Tracie Harrier  Reason for visit:  Colonoscopy  History of present illness:  Patient is a 71 year old male presenting today as above. There is a family history of colon polyps in both his mother and his father. He tolerated his prep well. He takes no blood thinning agents with the exception of 81 mg aspirin this been held for several days. He does have a history of thrombocytopenia and platelet count checked this morning was 119. PT is still pending.    Current Facility-Administered Medications:  .  0.9 %  sodium chloride infusion, , Intravenous, Continuous, Lollie Sails, MD .  0.9 %  sodium chloride infusion, , Intravenous, Continuous, Lollie Sails, MD  Medications Prior to Admission  Medication Sig Dispense Refill Last Dose  . triamcinolone (NASACORT ALLERGY 24HR) 55 MCG/ACT AERO nasal inhaler Place 2 sprays daily into the nose.     Marland Kitchen aspirin 81 MG chewable tablet Chew 81 mg by mouth daily.   Taking  . azelastine (ASTELIN) 137 MCG/SPRAY nasal spray Place 1-2 sprays into both nostrils every 12 (twelve) hours as needed (allergies).    Taking  . cholecalciferol (VITAMIN D) 1000 UNITS tablet Take 1,000 Units by mouth at bedtime.    Taking  . ferrous sulfate 325 (65 FE) MG tablet Take 325 mg by mouth daily.  3 Taking  . finasteride (PROSCAR) 5 MG tablet Take 5 mg by mouth every morning.    Taking  . levocetirizine (XYZAL) 5 MG tablet Take 5 mg by mouth at bedtime. To relieve allergy symptoms   Taking  . montelukast (SINGULAIR) 10 MG tablet Take 10 mg by mouth at bedtime.   Taking  . Multiple Vitamins-Minerals (CENTRUM SILVER ULTRA MENS) TABS Take 1 tablet by mouth every morning.    Taking  . Omega-3 Fatty Acids (FISH OIL) 1200 MG CAPS Take 1,200 mg by mouth 2 (two) times daily.    Taking  . omeprazole (PRILOSEC) 20 MG capsule Take 20 mg by mouth daily before breakfast. Takes  30 minutes before breakfast for acid reflux   Taking  . senna-docusate (SENOKOT-S) 8.6-50 MG tablet Take 1 tablet by mouth 2 (two) times daily.   Taking  . torsemide (DEMADEX) 20 MG tablet Take 1 tablet (20 mg total) by mouth 2 (two) times daily. TAKE 1 TABLET BY MOUTH TWO TIMES DAILY. TAKE IF WT. INCREASES 2-5 LBS 60 tablet 0 Taking  . triamcinolone cream (KENALOG) 0.1 % Apply 1 application topically daily as needed (skin rash).   2 Taking  . vitamin E 400 UNIT capsule Take 400 Units by mouth every morning.    Taking     Allergies  Allergen Reactions  . Other Other (See Comments)    Seasonal allergies/grass/trees/dust causes watery eyes, stuffiness, sometimes swelling.     Past Medical History:  Diagnosis Date  . Allergic state   . Amyloidosis ([place])   . BPH (benign prostatic hyperplasia)   . GERD (gastroesophageal reflux disease)   . Hypertension   . Mild mitral regurgitation    a. by echo 10/2012  . Polio 1940'S  . Syncope    a. 10/2012 Echo: EF 55-60%, severe LVH with speckling of myocardium -? amyloid. Mild MR, mod dil LA.    Review of systems:      Physical Exam    Heart and lungs: Regular rate and rhythm without rub or gallop, lungs are bilaterally clear  HEENT: Normocephalic atraumatic eyes are anicteric    Other:     Pertinant exam for procedure: Soft nontender nondistended bowel sounds positive normoactive    Planned proceedures: Colonoscopy and indicated procedures. I have discussed the risks benefits and complications of procedures to include not limited to bleeding, infection, perforation and the risk of sedation and the patient wishes to proceed.  INR today is 1.05.    Lollie Sails, MD Gastroenterology 10/29/2017  9:25 AM

## 2017-10-29 NOTE — Anesthesia Preprocedure Evaluation (Signed)
Anesthesia Evaluation  Patient identified by MRN, date of birth, ID band Patient awake    Reviewed: Allergy & Precautions, H&P , NPO status , Patient's Chart, lab work & pertinent test results, reviewed documented beta blocker date and time   Airway Mallampati: II   Neck ROM: full    Dental  (+) Poor Dentition   Pulmonary neg pulmonary ROS, asthma ,    Pulmonary exam normal        Cardiovascular Exercise Tolerance: Poor hypertension, negative cardio ROS Normal cardiovascular exam+ dysrhythmias  Rhythm:regular Rate:Normal     Neuro/Psych negative neurological ROS  negative psych ROS   GI/Hepatic negative GI ROS, Neg liver ROS, GERD  Medicated,  Endo/Other  negative endocrine ROS  Renal/GU negative Renal ROS  negative genitourinary   Musculoskeletal   Abdominal   Peds  Hematology negative hematology ROS (+)   Anesthesia Other Findings Past Medical History: No date: Allergic state No date: Amyloidosis ([place]) No date: BPH (benign prostatic hyperplasia) No date: GERD (gastroesophageal reflux disease) No date: Hypertension No date: Mild mitral regurgitation     Comment:  a. by echo 10/2012 1940'S: Polio No date: Syncope     Comment:  a. 10/2012 Echo: EF 55-60%, severe LVH with speckling of              myocardium -? amyloid. Mild MR, mod dil LA. Past Surgical History: 1984: CIRCUMCISION 03/2012: COLONOSCOPY No date: FOOT SURGERY     Comment:  right foot 2017: FOOT SURGERY; Right No date: LEG SURGERIES     Comment:  HX.OF MULTIPLE SURGERIES FOR LEG LENGTH DUE TO POLIO No date: NECK SURGERY No date: PILONIDAL CYST / SINUS EXCISION BMI    Body Mass Index:  26.31 kg/m     Reproductive/Obstetrics negative OB ROS                             Anesthesia Physical Anesthesia Plan  ASA: III  Anesthesia Plan: General   Post-op Pain Management:    Induction:   PONV Risk Score and  Plan:   Airway Management Planned:   Additional Equipment:   Intra-op Plan:   Post-operative Plan:   Informed Consent: I have reviewed the patients History and Physical, chart, labs and discussed the procedure including the risks, benefits and alternatives for the proposed anesthesia with the patient or authorized representative who has indicated his/her understanding and acceptance.   Dental Advisory Given  Plan Discussed with: CRNA  Anesthesia Plan Comments:         Anesthesia Quick Evaluation

## 2017-10-30 ENCOUNTER — Encounter: Payer: Self-pay | Admitting: Gastroenterology

## 2017-10-30 DIAGNOSIS — D696 Thrombocytopenia, unspecified: Secondary | ICD-10-CM | POA: Diagnosis not present

## 2017-10-30 DIAGNOSIS — K5900 Constipation, unspecified: Secondary | ICD-10-CM | POA: Diagnosis not present

## 2017-10-30 DIAGNOSIS — E782 Mixed hyperlipidemia: Secondary | ICD-10-CM | POA: Diagnosis not present

## 2017-10-30 DIAGNOSIS — Z0000 Encounter for general adult medical examination without abnormal findings: Secondary | ICD-10-CM | POA: Diagnosis not present

## 2017-10-30 DIAGNOSIS — G14 Postpolio syndrome: Secondary | ICD-10-CM | POA: Diagnosis not present

## 2017-10-30 DIAGNOSIS — K219 Gastro-esophageal reflux disease without esophagitis: Secondary | ICD-10-CM | POA: Diagnosis not present

## 2017-10-30 DIAGNOSIS — E8589 Other amyloidosis: Secondary | ICD-10-CM | POA: Diagnosis not present

## 2017-10-30 DIAGNOSIS — J45909 Unspecified asthma, uncomplicated: Secondary | ICD-10-CM | POA: Diagnosis not present

## 2017-10-30 LAB — SURGICAL PATHOLOGY

## 2017-10-30 NOTE — Anesthesia Postprocedure Evaluation (Signed)
Anesthesia Post Note  Patient: Eugene Martinez.  Procedure(s) Performed: COLONOSCOPY WITH PROPOFOL (N/A )  Patient location during evaluation: PACU Anesthesia Type: General Level of consciousness: awake and alert Pain management: pain level controlled Vital Signs Assessment: post-procedure vital signs reviewed and stable Respiratory status: spontaneous breathing, nonlabored ventilation, respiratory function stable and patient connected to nasal cannula oxygen Cardiovascular status: blood pressure returned to baseline and stable Postop Assessment: no apparent nausea or vomiting Anesthetic complications: no     Last Vitals:  Vitals:   10/29/17 1034 10/29/17 1044  BP: 114/71 119/67  Pulse: 63 60  Resp: 15 15  Temp:    SpO2: 97% 99%    Last Pain:  Vitals:   10/30/17 0742  TempSrc:   PainSc: 0-No pain                 Molli Barrows

## 2017-11-05 DIAGNOSIS — J301 Allergic rhinitis due to pollen: Secondary | ICD-10-CM | POA: Diagnosis not present

## 2017-11-05 DIAGNOSIS — J3089 Other allergic rhinitis: Secondary | ICD-10-CM | POA: Diagnosis not present

## 2017-11-06 DIAGNOSIS — D696 Thrombocytopenia, unspecified: Secondary | ICD-10-CM | POA: Diagnosis not present

## 2017-11-06 DIAGNOSIS — K219 Gastro-esophageal reflux disease without esophagitis: Secondary | ICD-10-CM | POA: Diagnosis not present

## 2017-11-06 DIAGNOSIS — J45909 Unspecified asthma, uncomplicated: Secondary | ICD-10-CM | POA: Diagnosis not present

## 2017-11-06 DIAGNOSIS — Z23 Encounter for immunization: Secondary | ICD-10-CM | POA: Diagnosis not present

## 2017-11-06 DIAGNOSIS — G14 Postpolio syndrome: Secondary | ICD-10-CM | POA: Diagnosis not present

## 2017-11-06 DIAGNOSIS — N400 Enlarged prostate without lower urinary tract symptoms: Secondary | ICD-10-CM | POA: Diagnosis not present

## 2017-11-07 DIAGNOSIS — J301 Allergic rhinitis due to pollen: Secondary | ICD-10-CM | POA: Diagnosis not present

## 2017-11-07 DIAGNOSIS — J3089 Other allergic rhinitis: Secondary | ICD-10-CM | POA: Diagnosis not present

## 2017-11-12 DIAGNOSIS — J301 Allergic rhinitis due to pollen: Secondary | ICD-10-CM | POA: Diagnosis not present

## 2017-11-12 DIAGNOSIS — J3089 Other allergic rhinitis: Secondary | ICD-10-CM | POA: Diagnosis not present

## 2017-11-14 DIAGNOSIS — J3089 Other allergic rhinitis: Secondary | ICD-10-CM | POA: Diagnosis not present

## 2017-11-14 DIAGNOSIS — J301 Allergic rhinitis due to pollen: Secondary | ICD-10-CM | POA: Diagnosis not present

## 2017-11-21 DIAGNOSIS — J3089 Other allergic rhinitis: Secondary | ICD-10-CM | POA: Diagnosis not present

## 2017-11-21 DIAGNOSIS — J301 Allergic rhinitis due to pollen: Secondary | ICD-10-CM | POA: Diagnosis not present

## 2017-11-25 ENCOUNTER — Ambulatory Visit: Payer: PPO | Admitting: Podiatry

## 2017-11-26 DIAGNOSIS — J301 Allergic rhinitis due to pollen: Secondary | ICD-10-CM | POA: Diagnosis not present

## 2017-11-26 DIAGNOSIS — J3089 Other allergic rhinitis: Secondary | ICD-10-CM | POA: Diagnosis not present

## 2017-11-28 DIAGNOSIS — G14 Postpolio syndrome: Secondary | ICD-10-CM | POA: Diagnosis not present

## 2017-11-28 DIAGNOSIS — J019 Acute sinusitis, unspecified: Secondary | ICD-10-CM | POA: Diagnosis not present

## 2017-11-28 DIAGNOSIS — E8589 Other amyloidosis: Secondary | ICD-10-CM | POA: Diagnosis not present

## 2017-12-17 DIAGNOSIS — J301 Allergic rhinitis due to pollen: Secondary | ICD-10-CM | POA: Diagnosis not present

## 2017-12-17 DIAGNOSIS — J3089 Other allergic rhinitis: Secondary | ICD-10-CM | POA: Diagnosis not present

## 2017-12-19 DIAGNOSIS — L821 Other seborrheic keratosis: Secondary | ICD-10-CM | POA: Diagnosis not present

## 2017-12-19 DIAGNOSIS — B351 Tinea unguium: Secondary | ICD-10-CM | POA: Diagnosis not present

## 2017-12-19 DIAGNOSIS — L905 Scar conditions and fibrosis of skin: Secondary | ICD-10-CM | POA: Diagnosis not present

## 2017-12-19 DIAGNOSIS — D225 Melanocytic nevi of trunk: Secondary | ICD-10-CM | POA: Diagnosis not present

## 2017-12-19 DIAGNOSIS — Z1283 Encounter for screening for malignant neoplasm of skin: Secondary | ICD-10-CM | POA: Diagnosis not present

## 2017-12-19 DIAGNOSIS — D229 Melanocytic nevi, unspecified: Secondary | ICD-10-CM | POA: Diagnosis not present

## 2017-12-19 DIAGNOSIS — L812 Freckles: Secondary | ICD-10-CM | POA: Diagnosis not present

## 2017-12-19 DIAGNOSIS — D485 Neoplasm of uncertain behavior of skin: Secondary | ICD-10-CM | POA: Diagnosis not present

## 2017-12-19 DIAGNOSIS — D692 Other nonthrombocytopenic purpura: Secondary | ICD-10-CM | POA: Diagnosis not present

## 2017-12-20 DIAGNOSIS — J45909 Unspecified asthma, uncomplicated: Secondary | ICD-10-CM | POA: Diagnosis not present

## 2017-12-20 DIAGNOSIS — J40 Bronchitis, not specified as acute or chronic: Secondary | ICD-10-CM | POA: Diagnosis not present

## 2017-12-24 DIAGNOSIS — J301 Allergic rhinitis due to pollen: Secondary | ICD-10-CM | POA: Diagnosis not present

## 2017-12-24 DIAGNOSIS — J3089 Other allergic rhinitis: Secondary | ICD-10-CM | POA: Diagnosis not present

## 2017-12-31 DIAGNOSIS — J301 Allergic rhinitis due to pollen: Secondary | ICD-10-CM | POA: Diagnosis not present

## 2017-12-31 DIAGNOSIS — J3089 Other allergic rhinitis: Secondary | ICD-10-CM | POA: Diagnosis not present

## 2018-01-16 DIAGNOSIS — J3089 Other allergic rhinitis: Secondary | ICD-10-CM | POA: Diagnosis not present

## 2018-01-16 DIAGNOSIS — J301 Allergic rhinitis due to pollen: Secondary | ICD-10-CM | POA: Diagnosis not present

## 2018-01-16 DIAGNOSIS — K219 Gastro-esophageal reflux disease without esophagitis: Secondary | ICD-10-CM | POA: Diagnosis not present

## 2018-01-16 DIAGNOSIS — J019 Acute sinusitis, unspecified: Secondary | ICD-10-CM | POA: Diagnosis not present

## 2018-01-21 ENCOUNTER — Inpatient Hospital Stay: Payer: PPO | Attending: Oncology | Admitting: Oncology

## 2018-01-21 ENCOUNTER — Telehealth: Payer: Self-pay

## 2018-01-21 ENCOUNTER — Inpatient Hospital Stay: Payer: PPO

## 2018-01-21 VITALS — BP 142/60 | HR 80 | Temp 98.2°F | Resp 17 | Ht 66.0 in | Wt 167.3 lb

## 2018-01-21 DIAGNOSIS — E8581 Light chain (AL) amyloidosis: Secondary | ICD-10-CM | POA: Insufficient documentation

## 2018-01-21 DIAGNOSIS — D6959 Other secondary thrombocytopenia: Secondary | ICD-10-CM | POA: Insufficient documentation

## 2018-01-21 DIAGNOSIS — I429 Cardiomyopathy, unspecified: Secondary | ICD-10-CM | POA: Diagnosis not present

## 2018-01-21 DIAGNOSIS — I517 Cardiomegaly: Secondary | ICD-10-CM | POA: Insufficient documentation

## 2018-01-21 DIAGNOSIS — D696 Thrombocytopenia, unspecified: Secondary | ICD-10-CM

## 2018-01-21 DIAGNOSIS — J3089 Other allergic rhinitis: Secondary | ICD-10-CM | POA: Diagnosis not present

## 2018-01-21 DIAGNOSIS — J301 Allergic rhinitis due to pollen: Secondary | ICD-10-CM | POA: Diagnosis not present

## 2018-01-21 LAB — CBC WITH DIFFERENTIAL/PLATELET
BASOS PCT: 1 %
Basophils Absolute: 0.1 10*3/uL (ref 0.0–0.1)
Eosinophils Absolute: 0.1 10*3/uL (ref 0.0–0.5)
Eosinophils Relative: 1 %
HEMATOCRIT: 42.4 % (ref 38.4–49.9)
HEMOGLOBIN: 14.3 g/dL (ref 13.0–17.1)
Lymphocytes Relative: 17 %
Lymphs Abs: 1.6 10*3/uL (ref 0.9–3.3)
MCH: 30.8 pg (ref 27.2–33.4)
MCHC: 33.8 g/dL (ref 32.0–36.0)
MCV: 91.2 fL (ref 79.3–98.0)
MONO ABS: 1 10*3/uL — AB (ref 0.1–0.9)
Monocytes Relative: 10 %
NEUTROS PCT: 71 %
Neutro Abs: 7.1 10*3/uL — ABNORMAL HIGH (ref 1.5–6.5)
Platelets: 168 10*3/uL (ref 140–400)
RBC: 4.65 MIL/uL (ref 4.20–5.82)
RDW: 13.8 % (ref 11.0–14.6)
WBC: 9.9 10*3/uL (ref 4.0–10.3)

## 2018-01-21 LAB — COMPREHENSIVE METABOLIC PANEL
ALBUMIN: 4.3 g/dL (ref 3.5–5.0)
ALT: 24 U/L (ref 0–55)
AST: 24 U/L (ref 5–34)
Alkaline Phosphatase: 60 U/L (ref 40–150)
Anion gap: 9 (ref 3–11)
BILIRUBIN TOTAL: 0.6 mg/dL (ref 0.2–1.2)
BUN: 24 mg/dL (ref 7–26)
CO2: 26 mmol/L (ref 22–29)
Calcium: 9.5 mg/dL (ref 8.4–10.4)
Chloride: 103 mmol/L (ref 98–109)
Creatinine, Ser: 0.91 mg/dL (ref 0.70–1.30)
GFR calc Af Amer: >60 mL/min (ref >60)
GFR calc non Af Amer: >60 mL/min (ref >60)
GLUCOSE: 97 mg/dL (ref 70–140)
POTASSIUM: 4.6 mmol/L (ref 3.5–5.1)
Sodium: 138 mmol/L (ref 136–145)
Total Protein: 7.6 g/dL (ref 6.4–8.3)

## 2018-01-21 NOTE — Telephone Encounter (Signed)
Called to verify vaccine dates for pneumonia and flu

## 2018-01-21 NOTE — Progress Notes (Signed)
  Eugene Martinez   Diagnosis: Amyloidosis  INTERVAL HISTORY:   Eugene Martinez returns as scheduled.  He feels well.  He had a recent sinus infection.  He recently took a trip to [place].  No leg edema.  Objective:  Vital signs in last 24 hours:  Blood pressure (!) 142/60, pulse 80, temperature 98.2 F (36.8 C), temperature source Oral, resp. rate 17, height 5\' 6"  (1.676 m), weight 167 lb 4.8 oz (75.9 kg), SpO2 97 %.    HEENT: No macroglossia.  No bleeding. Resp: Lungs clear bilaterally Cardio: Regular rate and rhythm GI: No hepatosplenomegaly Vascular: No leg edema   Lab Results:  Lab Results  Component Value Date   WBC 5.7 10/29/2017   HGB 14.1 10/29/2017   HCT 39.5 (L) 10/29/2017   MCV 90.5 10/29/2017   PLT 119 (L) 10/29/2017   NEUTROABS 3.5 10/29/2017    CMP     Component Value Date/Time   NA 140 07/22/2017 1227   K 4.0 07/22/2017 1227   CL 100 05/22/2013 0922   CO2 28 07/22/2017 1227   GLUCOSE 97 07/22/2017 1227   GLUCOSE 84 05/22/2013 0922   BUN 22.2 07/22/2017 1227   CREATININE 1.1 07/22/2017 1227   CALCIUM 9.4 07/22/2017 1227   PROT 6.9 07/22/2017 1227   ALBUMIN 4.1 07/22/2017 1227   AST 33 07/22/2017 1227   ALT 35 07/22/2017 1227   ALKPHOS 47 07/22/2017 1227   BILITOT 0.81 07/22/2017 1227   GFRNONAA 72 (L) 01/24/2013 0500   GFRAA 83 (L) 01/24/2013 0500   24-hour urine protein 07/22/2018: Less than 128 Medications: I have reviewed the patient's current medications.   Assessment/Plan:  1. 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 Followed bycardiology,  echocardiogram September 2015 with an LVEF of 65-70% and severe LVH 4. 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 Zoster rash December 2016   Disposition: Eugene Martinez appears stable.  We will follow-up on the CBC, light chains, IgG, and 24-hour urine protein from today.  He will return for office and lab visit in 6 months.  There is no clinical evidence of progressive amyloidosis.  He continues follow-up with cardiology for the left ventricular hypertrophy.  15 minutes were spent with the patient today.  The majority of the time was used for counseling and coordination of care.  Betsy Coder, MD  01/21/2018  12:44 PM

## 2018-01-21 NOTE — Telephone Encounter (Signed)
Will get avs and calender printed in labs. Per 2/12 los

## 2018-01-22 ENCOUNTER — Telehealth: Payer: Self-pay

## 2018-01-22 LAB — UPEP/TP, 24-HR URINE
Albumin, U: 100 %
Alpha 1, Urine: 0 %
Alpha 2, Urine: 0 %
BETA UR: 0 %
GAMMA UR: 0 %
Total Protein, Urine-Ur/day: 119 mg/24 hr (ref 30–150)
Total Protein, Urine: 6.6 mg/dL
Total Volume: 1800

## 2018-01-22 LAB — KAPPA/LAMBDA LIGHT CHAINS
KAPPA, LAMDA LIGHT CHAIN RATIO: 0.65 (ref 0.26–1.65)
Kappa free light chain: 18.7 mg/L (ref 3.3–19.4)
LAMDA FREE LIGHT CHAINS: 28.7 mg/L — AB (ref 5.7–26.3)

## 2018-01-22 NOTE — Telephone Encounter (Addendum)
Pt voiced understanding of message below.   ----- Message from Ladell Pier, MD sent at 01/22/2018  3:02 PM EST ----- Please call patient, lambda light chains are slightly higher, but not significantly changed compared to previous values, follow-up as scheduled.

## 2018-01-23 LAB — PROTEIN ELECTROPHORESIS, SERUM
A/G Ratio: 1.4 (ref 0.7–1.7)
ALPHA-1-GLOBULIN: 0.2 g/dL (ref 0.0–0.4)
Albumin ELP: 4.2 g/dL (ref 2.9–4.4)
Alpha-2-Globulin: 0.9 g/dL (ref 0.4–1.0)
Beta Globulin: 1.1 g/dL (ref 0.7–1.3)
GAMMA GLOBULIN: 1 g/dL (ref 0.4–1.8)
Globulin, Total: 3.1 g/dL (ref 2.2–3.9)
TOTAL PROTEIN ELP: 7.3 g/dL (ref 6.0–8.5)

## 2018-01-28 DIAGNOSIS — J301 Allergic rhinitis due to pollen: Secondary | ICD-10-CM | POA: Diagnosis not present

## 2018-01-28 DIAGNOSIS — J3089 Other allergic rhinitis: Secondary | ICD-10-CM | POA: Diagnosis not present

## 2018-02-04 DIAGNOSIS — J3089 Other allergic rhinitis: Secondary | ICD-10-CM | POA: Diagnosis not present

## 2018-02-04 DIAGNOSIS — J301 Allergic rhinitis due to pollen: Secondary | ICD-10-CM | POA: Diagnosis not present

## 2018-02-11 DIAGNOSIS — J301 Allergic rhinitis due to pollen: Secondary | ICD-10-CM | POA: Diagnosis not present

## 2018-02-11 DIAGNOSIS — J3089 Other allergic rhinitis: Secondary | ICD-10-CM | POA: Diagnosis not present

## 2018-02-18 DIAGNOSIS — J3089 Other allergic rhinitis: Secondary | ICD-10-CM | POA: Diagnosis not present

## 2018-02-18 DIAGNOSIS — J301 Allergic rhinitis due to pollen: Secondary | ICD-10-CM | POA: Diagnosis not present

## 2018-02-25 DIAGNOSIS — J301 Allergic rhinitis due to pollen: Secondary | ICD-10-CM | POA: Diagnosis not present

## 2018-02-25 DIAGNOSIS — J3089 Other allergic rhinitis: Secondary | ICD-10-CM | POA: Diagnosis not present

## 2018-03-04 DIAGNOSIS — J301 Allergic rhinitis due to pollen: Secondary | ICD-10-CM | POA: Diagnosis not present

## 2018-03-04 DIAGNOSIS — J3089 Other allergic rhinitis: Secondary | ICD-10-CM | POA: Diagnosis not present

## 2018-03-11 DIAGNOSIS — J301 Allergic rhinitis due to pollen: Secondary | ICD-10-CM | POA: Diagnosis not present

## 2018-03-11 DIAGNOSIS — J3089 Other allergic rhinitis: Secondary | ICD-10-CM | POA: Diagnosis not present

## 2018-03-12 DIAGNOSIS — J069 Acute upper respiratory infection, unspecified: Secondary | ICD-10-CM | POA: Diagnosis not present

## 2018-03-18 DIAGNOSIS — J3089 Other allergic rhinitis: Secondary | ICD-10-CM | POA: Diagnosis not present

## 2018-03-18 DIAGNOSIS — J301 Allergic rhinitis due to pollen: Secondary | ICD-10-CM | POA: Diagnosis not present

## 2018-03-25 DIAGNOSIS — J301 Allergic rhinitis due to pollen: Secondary | ICD-10-CM | POA: Diagnosis not present

## 2018-03-25 DIAGNOSIS — J3089 Other allergic rhinitis: Secondary | ICD-10-CM | POA: Diagnosis not present

## 2018-04-01 DIAGNOSIS — J3089 Other allergic rhinitis: Secondary | ICD-10-CM | POA: Diagnosis not present

## 2018-04-01 DIAGNOSIS — J301 Allergic rhinitis due to pollen: Secondary | ICD-10-CM | POA: Diagnosis not present

## 2018-04-08 DIAGNOSIS — J3089 Other allergic rhinitis: Secondary | ICD-10-CM | POA: Diagnosis not present

## 2018-04-08 DIAGNOSIS — J301 Allergic rhinitis due to pollen: Secondary | ICD-10-CM | POA: Diagnosis not present

## 2018-04-15 DIAGNOSIS — J3089 Other allergic rhinitis: Secondary | ICD-10-CM | POA: Diagnosis not present

## 2018-04-15 DIAGNOSIS — J301 Allergic rhinitis due to pollen: Secondary | ICD-10-CM | POA: Diagnosis not present

## 2018-04-22 DIAGNOSIS — J3089 Other allergic rhinitis: Secondary | ICD-10-CM | POA: Diagnosis not present

## 2018-04-22 DIAGNOSIS — J301 Allergic rhinitis due to pollen: Secondary | ICD-10-CM | POA: Diagnosis not present

## 2018-04-29 DIAGNOSIS — Z0000 Encounter for general adult medical examination without abnormal findings: Secondary | ICD-10-CM | POA: Diagnosis not present

## 2018-04-29 DIAGNOSIS — D696 Thrombocytopenia, unspecified: Secondary | ICD-10-CM | POA: Diagnosis not present

## 2018-05-06 DIAGNOSIS — E8589 Other amyloidosis: Secondary | ICD-10-CM | POA: Diagnosis not present

## 2018-05-06 DIAGNOSIS — D649 Anemia, unspecified: Secondary | ICD-10-CM | POA: Diagnosis not present

## 2018-05-06 DIAGNOSIS — K219 Gastro-esophageal reflux disease without esophagitis: Secondary | ICD-10-CM | POA: Diagnosis not present

## 2018-05-06 DIAGNOSIS — N400 Enlarged prostate without lower urinary tract symptoms: Secondary | ICD-10-CM | POA: Diagnosis not present

## 2018-05-06 DIAGNOSIS — Z0000 Encounter for general adult medical examination without abnormal findings: Secondary | ICD-10-CM | POA: Diagnosis not present

## 2018-05-06 DIAGNOSIS — K5900 Constipation, unspecified: Secondary | ICD-10-CM | POA: Diagnosis not present

## 2018-05-06 DIAGNOSIS — J45909 Unspecified asthma, uncomplicated: Secondary | ICD-10-CM | POA: Diagnosis not present

## 2018-05-06 DIAGNOSIS — R739 Hyperglycemia, unspecified: Secondary | ICD-10-CM | POA: Diagnosis not present

## 2018-05-06 DIAGNOSIS — D696 Thrombocytopenia, unspecified: Secondary | ICD-10-CM | POA: Diagnosis not present

## 2018-06-04 DIAGNOSIS — H2513 Age-related nuclear cataract, bilateral: Secondary | ICD-10-CM | POA: Diagnosis not present

## 2018-07-01 DIAGNOSIS — M25551 Pain in right hip: Secondary | ICD-10-CM | POA: Diagnosis not present

## 2018-07-01 DIAGNOSIS — M25552 Pain in left hip: Secondary | ICD-10-CM | POA: Diagnosis not present

## 2018-07-01 DIAGNOSIS — M545 Low back pain: Secondary | ICD-10-CM | POA: Diagnosis not present

## 2018-07-04 DIAGNOSIS — J301 Allergic rhinitis due to pollen: Secondary | ICD-10-CM | POA: Diagnosis not present

## 2018-07-04 DIAGNOSIS — R0982 Postnasal drip: Secondary | ICD-10-CM | POA: Diagnosis not present

## 2018-07-11 DIAGNOSIS — J019 Acute sinusitis, unspecified: Secondary | ICD-10-CM | POA: Diagnosis not present

## 2018-07-21 ENCOUNTER — Telehealth: Payer: Self-pay

## 2018-07-21 ENCOUNTER — Inpatient Hospital Stay: Payer: PPO

## 2018-07-21 ENCOUNTER — Inpatient Hospital Stay: Payer: PPO | Attending: Oncology | Admitting: Oncology

## 2018-07-21 VITALS — BP 126/60 | HR 87 | Temp 98.7°F | Resp 18 | Ht 66.0 in | Wt 171.9 lb

## 2018-07-21 DIAGNOSIS — E8581 Light chain (AL) amyloidosis: Secondary | ICD-10-CM

## 2018-07-21 DIAGNOSIS — M25551 Pain in right hip: Secondary | ICD-10-CM | POA: Diagnosis not present

## 2018-07-21 DIAGNOSIS — D696 Thrombocytopenia, unspecified: Secondary | ICD-10-CM | POA: Insufficient documentation

## 2018-07-21 DIAGNOSIS — M25552 Pain in left hip: Secondary | ICD-10-CM | POA: Insufficient documentation

## 2018-07-21 DIAGNOSIS — I429 Cardiomyopathy, unspecified: Secondary | ICD-10-CM | POA: Insufficient documentation

## 2018-07-21 LAB — CBC WITH DIFFERENTIAL (CANCER CENTER ONLY)
Basophils Absolute: 0 10*3/uL (ref 0.0–0.1)
Basophils Relative: 0 %
EOS PCT: 2 %
Eosinophils Absolute: 0.2 10*3/uL (ref 0.0–0.5)
HCT: 37.9 % — ABNORMAL LOW (ref 38.4–49.9)
Hemoglobin: 13.3 g/dL (ref 13.0–17.1)
LYMPHS ABS: 1.3 10*3/uL (ref 0.9–3.3)
Lymphocytes Relative: 17 %
MCH: 31.7 pg (ref 27.2–33.4)
MCHC: 35.1 g/dL (ref 32.0–36.0)
MCV: 90.2 fL (ref 79.3–98.0)
MONO ABS: 1.2 10*3/uL — AB (ref 0.1–0.9)
MONOS PCT: 16 %
Neutro Abs: 4.9 10*3/uL (ref 1.5–6.5)
Neutrophils Relative %: 65 %
PLATELETS: 109 10*3/uL — AB (ref 140–400)
RBC: 4.2 MIL/uL (ref 4.20–5.82)
RDW: 13 % (ref 11.0–14.6)
WBC Count: 7.6 10*3/uL (ref 4.0–10.3)

## 2018-07-21 LAB — CMP (CANCER CENTER ONLY)
ALBUMIN: 3.8 g/dL (ref 3.5–5.0)
ALT: 25 U/L (ref 0–44)
AST: 25 U/L (ref 15–41)
Alkaline Phosphatase: 56 U/L (ref 38–126)
Anion gap: 10 (ref 5–15)
BILIRUBIN TOTAL: 0.8 mg/dL (ref 0.3–1.2)
BUN: 18 mg/dL (ref 8–23)
CO2: 24 mmol/L (ref 22–32)
Calcium: 8.9 mg/dL (ref 8.9–10.3)
Chloride: 103 mmol/L (ref 98–111)
Creatinine: 0.87 mg/dL (ref 0.61–1.24)
GFR, Est AFR Am: >60 mL/min (ref >60)
GFR, Estimated: >60 mL/min (ref >60)
GLUCOSE: 137 mg/dL — AB (ref 70–99)
POTASSIUM: 4 mmol/L (ref 3.5–5.1)
SODIUM: 137 mmol/L (ref 135–145)
TOTAL PROTEIN: 6.7 g/dL (ref 6.5–8.1)

## 2018-07-21 NOTE — Progress Notes (Signed)
  Eugene Martinez OFFICE PROGRESS NOTE   Diagnosis: Amyloidosis  INTERVAL HISTORY:   Eugene Martinez returns for a scheduled visit.  He had a sinus infection in June.  He reports discomfort at the bilateral "hip" area for the past several months.  He was evaluated by orthopedics.  No leg weakness.  No back pain.  The pain is worse in the mornings. No leg swelling.  Objective:  Vital signs in last 24 hours:  Blood pressure 126/60, pulse 87, temperature 98.7 F (37.1 C), temperature source Oral, resp. rate 18, height 5\' 6"  (1.676 m), weight 171 lb 14.4 oz (78 kg), SpO2 97 %.    HEENT:?  Mild enlargement of the tongue Resp: Lungs clear bilaterally Cardio: Regular rate and rhythm, S3? GI: No hepatosplenomegaly Vascular: No leg edema Muscular skeletal: No tenderness at the trochanter bilaterally, no pain with motion at the hip, no lower spine tenderness   Lab Results:  Lab Results  Component Value Date   WBC 7.6 07/21/2018   HGB 13.3 07/21/2018   HCT 37.9 (L) 07/21/2018   MCV 90.2 07/21/2018   PLT 109 (L) 07/21/2018   NEUTROABS 4.9 07/21/2018    CMP  Lab Results  Component Value Date   NA 138 01/21/2018   K 4.6 01/21/2018   CL 103 01/21/2018   CO2 26 01/21/2018   GLUCOSE 97 01/21/2018   BUN 24 01/21/2018   CREATININE 0.91 01/21/2018   CALCIUM 9.5 01/21/2018   PROT 7.6 01/21/2018   ALBUMIN 4.3 01/21/2018   AST 24 01/21/2018   ALT 24 01/21/2018   ALKPHOS 60 01/21/2018   BILITOT 0.6 01/21/2018   GFRNONAA >60 01/21/2018   GFRAA >60 01/21/2018  24-hour urine total protein 212 2019-119 mg  Medications: I have reviewed the patient's current medications.   Assessment/Plan: 1. 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 Followed bycardiology, echocardiogram September 2015 with an LVEF of 65-70% and severe LVH 4. 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 Zoster rash December 2016    Disposition: Mr. Burmester appears stable.  The "hip "discomfort is likely related to a benign musculoskeletal condition.  He will contact us for increased pain.  There is no clinical evidence for progression of the amyloidosis.  We will follow-up on the myeloma panel and 24-hour urine protein today.  He will return for an office and lab visit in 6 months.  He reports he is scheduled for a cardiology appointment this fall.  We will request a repeat echocardiogram to follow-up on the LVH.  15 minutes were spent with the patient today.  The majority of the time was used for counseling and coordination of care.  Betsy Coder, MD  07/21/2018  1:10 PM

## 2018-07-21 NOTE — Telephone Encounter (Signed)
Printed avs and calender of upcoming appointment. Per 8/12 los 

## 2018-07-22 ENCOUNTER — Telehealth: Payer: Self-pay | Admitting: *Deleted

## 2018-07-22 DIAGNOSIS — I517 Cardiomegaly: Secondary | ICD-10-CM

## 2018-07-22 LAB — KAPPA/LAMBDA LIGHT CHAINS
KAPPA, LAMDA LIGHT CHAIN RATIO: 0.67 (ref 0.26–1.65)
Kappa free light chain: 22 mg/L — ABNORMAL HIGH (ref 3.3–19.4)
Lambda free light chains: 32.9 mg/L — ABNORMAL HIGH (ref 5.7–26.3)

## 2018-07-22 LAB — PROTEIN ELECTROPHORESIS, SERUM
A/G Ratio: 1.3 (ref 0.7–1.7)
ALBUMIN ELP: 3.5 g/dL (ref 2.9–4.4)
Alpha-1-Globulin: 0.2 g/dL (ref 0.0–0.4)
Alpha-2-Globulin: 0.8 g/dL (ref 0.4–1.0)
BETA GLOBULIN: 0.9 g/dL (ref 0.7–1.3)
GAMMA GLOBULIN: 0.9 g/dL (ref 0.4–1.8)
Globulin, Total: 2.8 g/dL (ref 2.2–3.9)
Total Protein ELP: 6.3 g/dL (ref 6.0–8.5)

## 2018-07-22 LAB — IGG: IgG (Immunoglobin G), Serum: 900 mg/dL (ref 700–1600)

## 2018-07-22 NOTE — Telephone Encounter (Signed)
-----   Message from Minna Merritts, MD sent at 07/22/2018  2:07 PM EDT -----  Patient's oncology doctor has recommended echocardiogram History of amyloidosis, Severe LVH Can we call the patient to help arrange thx TG  ----- Message ----- From: Ladell Pier, MD Sent: 07/21/2018   1:55 PM EDT To: Minna Merritts, MD  Eugene Martinez has a history of amyloidosis, currently not on specific therapy  Will you consider obtaining an echocardiogram when you see him next month?  Thanks,  Julieanne Manson

## 2018-07-22 NOTE — Telephone Encounter (Signed)
I left a message for the patient to call. 

## 2018-07-23 LAB — UPEP/TP, 24-HR URINE
ALPHA 1 UR: 0 %
ALPHA 2, URINE: 0 %
Albumin, U: 100 %
BETA UR: 0 %
Gamma Globulin, Urine: 0 %
TOTAL PROTEIN, URINE-UR/DAY: 74 mg/(24.h) (ref 30–150)
TOTAL VOLUME: 1600
Total Protein, Urine: 4.6 mg/dL

## 2018-07-25 NOTE — Telephone Encounter (Signed)
S/w patient. He was aware that he needs to have an echo and is agreeable. Order entered and transferred to scheduler.

## 2018-07-29 ENCOUNTER — Other Ambulatory Visit: Payer: Self-pay | Admitting: Cardiovascular Disease

## 2018-07-29 DIAGNOSIS — I517 Cardiomegaly: Secondary | ICD-10-CM

## 2018-07-29 DIAGNOSIS — I428 Other cardiomyopathies: Secondary | ICD-10-CM

## 2018-07-29 DIAGNOSIS — E859 Amyloidosis, unspecified: Secondary | ICD-10-CM

## 2018-07-31 ENCOUNTER — Other Ambulatory Visit: Payer: Self-pay

## 2018-07-31 ENCOUNTER — Ambulatory Visit (INDEPENDENT_AMBULATORY_CARE_PROVIDER_SITE_OTHER): Payer: PPO

## 2018-07-31 DIAGNOSIS — I517 Cardiomegaly: Secondary | ICD-10-CM | POA: Diagnosis not present

## 2018-07-31 DIAGNOSIS — E859 Amyloidosis, unspecified: Secondary | ICD-10-CM

## 2018-07-31 DIAGNOSIS — I428 Other cardiomyopathies: Secondary | ICD-10-CM

## 2018-09-15 NOTE — Progress Notes (Signed)
Cardiology Office Note  Date:  09/16/2018   ID:  Eugene Martinez., DOB November 30, 1946, MRN [id number]  PCP:  Tracie Harrier, MD   Chief Complaint  Patient presents with  . Other    12 month f/u no complaints today. Meds reviewed verbally with pt.    HPI:  72 year old gentleman With history of  AL Amyloidosis, previous chemo, f/u 6 mon, in remission since 11/2013 renal biopsy established the diagnosis of AL amyloidosis. Followed by oncology Syncope  severe LVH on echo and MRI,  exertional syncope who presents for routine follow-up of his severe LVH, amyloid disease  Echocardiogram August 2019 Results discussed with him in detail, significant change compared to 2013 Left ventricle: The cavity size was normal. There was severe concentric hypertrophy. Systolic function was normal. The estimated ejection fraction was in the range of 55% to 60%. Wall motion was normal; there were no regional wall motion abnormalities. Features are consistent with a pseudonormal left ventricular filling pattern, with concomitant abnormal relaxation and increased filling pressure (grade 2 diastolic dysfunction). - Aortic valve: There was mild regurgitation. - Mitral valve: Calcified annulus. There was mild regurgitation. - Left atrium: The atrium was mildly dilated. - Right ventricle: Systolic function was normal. - Pulmonary arteries: Systolic pressure was within the normal range.  No near syncope, or syncope Limited by orthopedica issues  Does not take toresemide only as needed for weigtht gain, 5 pounds Does not remember the last time he took torsemide  Had polio in the past has a brace right lower extremity Walking with a walker on today's visit as he has chronic back and hip pain the past 6 weeks  No palpitations concerning for arrhythmia No regular exercise program  Lab work reviewed in detail with him on today's visit Total chol 137, LDL 74  EKG personally reviewed by myself on todays  visit shows normal sinus rhythm with rate 93 pm, right bundle branch block, LVH, no change from previous EKG    other past medical history September 2015 hospitalized at [facility] hospital after experiencing exertional syncope while walking up steps    echocardiogram with  significant left ventricular hypertrophy and a speckling of the myocardium   Systolic function was normal with ejection fraction of 55-60% and the left atrium was moderately dilated.  cardiac MRI 2013 Severe LVH, Left ventricular wall motion was normal with an ejection fraction of 66%. mild right ventricular hypertrophy and normal right atrial size and mild to moderate mitral regurgitation.  Rarely takes Lasix as needed if his weight increased or if he had unusual shortness of breath.  urine immunoelectrophoresis   light chain Bence-Jones protein in the urine.  fat pad biopsy which was not diagnostic for myeloma or amyloidosis and a bone marrow biopsy which was not diagnostic.  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  completed chemotherapy for his amyloidosis and has been in remission since December 2014. He has had previous harvest of stem cells down at [facility] but has not had a stem cell transplant.  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  echocardiogram September 2015 with an LVEF of 65-70% and severe LVH 4. 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  Zoster rash December 2016  PMH:   has a past medical history of Allergic state, Amyloidosis ([place]), BPH (benign prostatic hyperplasia), GERD (gastroesophageal reflux disease), Hypertension, Mild mitral  regurgitation, Polio (1940'S), and Syncope.  PSH:    Past Surgical History:  Procedure Laterality Date  . CIRCUMCISION  1984  . COLONOSCOPY  03/2012  . COLONOSCOPY WITH PROPOFOL N/A 10/29/2017   Procedure: COLONOSCOPY WITH PROPOFOL;  Surgeon: Lollie Sails, MD;  Location: [facility] ENDOSCOPY;  Service: Endoscopy;  Laterality: N/A;  . FOOT SURGERY     right foot  . FOOT SURGERY Right 2017  . LEG SURGERIES     HX.OF MULTIPLE SURGERIES FOR LEG LENGTH DUE TO POLIO  . NECK SURGERY    . PILONIDAL CYST / SINUS EXCISION      Current Outpatient Medications  Medication Sig Dispense Refill  . aspirin 81 MG chewable tablet Chew 81 mg by mouth daily.    Marland Kitchen azelastine (ASTELIN) 137 MCG/SPRAY nasal spray Place 1-2 sprays into both nostrils every 12 (twelve) hours as needed (allergies).     . cholecalciferol (VITAMIN D) 1000 UNITS tablet Take 1,000 Units by mouth at bedtime.     . finasteride (PROSCAR) 5 MG tablet Take 5 mg by mouth every morning.     . iron polysaccharides (FERREX 150) 150 MG capsule daily.    . iron polysaccharides (NIFEREX) 150 MG capsule Take by mouth.    . levocetirizine (XYZAL) 5 MG tablet Take 5 mg by mouth at bedtime. To relieve allergy symptoms    . montelukast (SINGULAIR) 10 MG tablet Take 10 mg by mouth at bedtime.    . Multiple Vitamins-Minerals (CENTRUM SILVER ULTRA MENS) TABS Take 1 tablet by mouth every morning.     . mupirocin ointment (BACTROBAN) 2 % Apply topically.    . Omega-3 Fatty Acids (FISH OIL) 1200 MG CAPS Take 1,200 mg by mouth 2 (two) times daily.     Marland Kitchen omeprazole (PRILOSEC) 20 MG capsule Take 20 mg by mouth daily before breakfast. Takes 30 minutes before breakfast for acid reflux    . torsemide (DEMADEX) 20 MG tablet Take 1 tablet (20 mg total) by mouth 2 (two) times daily. TAKE 1 TABLET BY MOUTH TWO TIMES DAILY. TAKE IF WT. INCREASES 2-5 LBS 60 tablet 0  . triamcinolone (NASACORT ALLERGY 24HR) 55 MCG/ACT AERO nasal inhaler Place 2 sprays daily into the  nose.    . triamcinolone cream (KENALOG) 0.1 % Apply 1 application topically daily as needed (skin rash).   2  . vitamin E 400 UNIT capsule Take 400 Units by mouth every morning.      No current facility-administered medications for this visit.      Allergies:   Other   Social History:  The patient  reports that he has never smoked. He has never used smokeless tobacco. He reports that he does not drink alcohol or use drugs.   Family History:   family history includes Heart attack in his father.    Review of Systems: Review of Systems  Constitutional: Negative.   Respiratory: Negative.   Cardiovascular: Negative.   Gastrointestinal: Negative.   Musculoskeletal: Positive for back pain and joint pain.  Neurological: Negative.   Psychiatric/Behavioral: Negative.   All other systems reviewed and are negative. no change from last office visit   PHYSICAL EXAM: VS:  BP 138/78 (BP Location: Left Arm, Patient Position: Sitting, Cuff Size: Normal)   Pulse 93   Ht '5\' 6"'  (1.676 m)  Wt 170 lb 8 oz (77.3 kg)   BMI 27.52 kg/m  , BMI Body mass index is 27.52 kg/m. Constitutional:  oriented to person, place, and time. No distress.  HENT:  Head: Normocephalic and atraumatic.  Eyes:  no discharge. No scleral icterus.  Neck: Normal range of motion. Neck supple. No JVD present.  Cardiovascular: Normal rate, regular rhythm, normal heart sounds and intact distal pulses. Exam reveals no gallop and no friction rub. No edema No murmur heard. Pulmonary/Chest: Effort normal and breath sounds normal. No stridor. No respiratory distress.  no wheezes.  no rales.  no tenderness.  Abdominal: Soft.  no distension.  no tenderness.  Musculoskeletal: Normal range of motion.  no  tenderness or deformity.  Neurological:  normal muscle tone. Coordination normal. No atrophy Skin: Skin is warm and dry. No rash noted. not diaphoretic.  Psychiatric:  normal mood and affect. behavior is normal. Thought content  normal.    Recent Labs: 07/21/2018: ALT 25; BUN 18; Creatinine 0.87; Hemoglobin 13.3; Platelet Count 109; Potassium 4.0; Sodium 137    Lipid Panel Lab Results  Component Value Date   CHOL 209 (H) 10/31/2012   HDL 37 (L) 10/31/2012   LDLCALC 148 (H) 10/31/2012   TRIG 118 10/31/2012      Wt Readings from Last 3 Encounters:  09/16/18 170 lb 8 oz (77.3 kg)  07/21/18 171 lb 14.4 oz (78 kg)  01/21/18 167 lb 4.8 oz (75.9 kg)       ASSESSMENT AND PLAN:  Essential hypertension - Plan: EKG 12-Lead Blood pressure stable, no medication changes Stable  Syncope, unspecified syncope type Denies any near-syncope or syncope  discussed his severe LVH anatomy  on echocardiogram We will continue to monitor closely  AL amyloidosis (HCC) Reports symptoms stable,  Previous chemotherapy, stable Followed by oncology  LVH (left ventricular hypertrophy)  severe LVH on echocardiogram and MRI No change in the past 6 years  Hyperlipidemia Cholesterol down to 137 Does not eat very much   Total encounter time more than 25 minutes  Greater than 50% was spent in counseling and coordination of care with the patient   Disposition:   F/U  12 months   Orders Placed This Encounter  Procedures  . EKG 12-Lead     Signed, Esmond Plants, M.D., Ph.D. 09/16/2018  [place], [place]

## 2018-09-16 ENCOUNTER — Encounter: Payer: Self-pay | Admitting: Cardiovascular Disease

## 2018-09-16 ENCOUNTER — Ambulatory Visit: Payer: PPO | Admitting: Cardiovascular Disease

## 2018-09-16 VITALS — BP 138/78 | HR 93 | Ht 66.0 in | Wt 170.5 lb

## 2018-09-16 DIAGNOSIS — R55 Syncope and collapse: Secondary | ICD-10-CM

## 2018-09-16 DIAGNOSIS — E8581 Light chain (AL) amyloidosis: Secondary | ICD-10-CM | POA: Diagnosis not present

## 2018-09-16 DIAGNOSIS — I5032 Chronic diastolic (congestive) heart failure: Secondary | ICD-10-CM | POA: Diagnosis not present

## 2018-09-16 DIAGNOSIS — I517 Cardiomegaly: Secondary | ICD-10-CM | POA: Diagnosis not present

## 2018-09-16 DIAGNOSIS — E8589 Other amyloidosis: Secondary | ICD-10-CM | POA: Diagnosis not present

## 2018-09-16 DIAGNOSIS — I10 Essential (primary) hypertension: Secondary | ICD-10-CM

## 2018-09-16 DIAGNOSIS — M160 Bilateral primary osteoarthritis of hip: Secondary | ICD-10-CM | POA: Diagnosis not present

## 2018-09-16 DIAGNOSIS — D696 Thrombocytopenia, unspecified: Secondary | ICD-10-CM | POA: Diagnosis not present

## 2018-09-16 DIAGNOSIS — T7840XS Allergy, unspecified, sequela: Secondary | ICD-10-CM | POA: Diagnosis not present

## 2018-09-16 DIAGNOSIS — G14 Postpolio syndrome: Secondary | ICD-10-CM | POA: Diagnosis not present

## 2018-09-16 DIAGNOSIS — R29898 Other symptoms and signs involving the musculoskeletal system: Secondary | ICD-10-CM | POA: Diagnosis not present

## 2018-09-16 NOTE — Patient Instructions (Signed)

## 2018-09-17 ENCOUNTER — Ambulatory Visit: Payer: PPO | Admitting: Cardiovascular Disease

## 2018-09-23 DIAGNOSIS — M5441 Lumbago with sciatica, right side: Secondary | ICD-10-CM | POA: Diagnosis not present

## 2018-09-23 DIAGNOSIS — M5442 Lumbago with sciatica, left side: Secondary | ICD-10-CM | POA: Diagnosis not present

## 2018-09-23 DIAGNOSIS — G8929 Other chronic pain: Secondary | ICD-10-CM | POA: Diagnosis not present

## 2018-09-24 ENCOUNTER — Other Ambulatory Visit: Payer: Self-pay | Admitting: Physician Assistant

## 2018-09-24 DIAGNOSIS — M5442 Lumbago with sciatica, left side: Principal | ICD-10-CM

## 2018-09-24 DIAGNOSIS — M5441 Lumbago with sciatica, right side: Principal | ICD-10-CM

## 2018-09-24 DIAGNOSIS — G8929 Other chronic pain: Secondary | ICD-10-CM

## 2018-10-08 ENCOUNTER — Ambulatory Visit
Admission: RE | Admit: 2018-10-08 | Discharge: 2018-10-08 | Disposition: A | Discharge status: Home / Self Care | Payer: PPO | Source: Ambulatory Visit | Attending: Physician Assistant | Admitting: Physician Assistant

## 2018-10-08 DIAGNOSIS — M5136 Other intervertebral disc degeneration, lumbar region: Secondary | ICD-10-CM | POA: Insufficient documentation

## 2018-10-08 DIAGNOSIS — M48061 Spinal stenosis, lumbar region without neurogenic claudication: Secondary | ICD-10-CM | POA: Insufficient documentation

## 2018-10-08 DIAGNOSIS — G8929 Other chronic pain: Secondary | ICD-10-CM | POA: Insufficient documentation

## 2018-10-08 DIAGNOSIS — M5442 Lumbago with sciatica, left side: Secondary | ICD-10-CM | POA: Diagnosis not present

## 2018-10-08 DIAGNOSIS — M545 Low back pain: Secondary | ICD-10-CM | POA: Diagnosis not present

## 2018-10-08 DIAGNOSIS — M5441 Lumbago with sciatica, right side: Secondary | ICD-10-CM | POA: Insufficient documentation

## 2018-10-14 DIAGNOSIS — M47814 Spondylosis without myelopathy or radiculopathy, thoracic region: Secondary | ICD-10-CM | POA: Diagnosis not present

## 2018-10-14 DIAGNOSIS — M5134 Other intervertebral disc degeneration, thoracic region: Secondary | ICD-10-CM | POA: Diagnosis not present

## 2018-10-14 DIAGNOSIS — M47816 Spondylosis without myelopathy or radiculopathy, lumbar region: Secondary | ICD-10-CM | POA: Diagnosis not present

## 2018-10-14 DIAGNOSIS — R29898 Other symptoms and signs involving the musculoskeletal system: Secondary | ICD-10-CM | POA: Diagnosis not present

## 2018-10-14 DIAGNOSIS — M4316 Spondylolisthesis, lumbar region: Secondary | ICD-10-CM | POA: Diagnosis not present

## 2018-10-14 DIAGNOSIS — Z8612 Personal history of poliomyelitis: Secondary | ICD-10-CM | POA: Diagnosis not present

## 2018-10-22 DIAGNOSIS — M79604 Pain in right leg: Secondary | ICD-10-CM | POA: Diagnosis not present

## 2018-10-22 DIAGNOSIS — M79605 Pain in left leg: Secondary | ICD-10-CM | POA: Diagnosis not present

## 2018-10-24 DIAGNOSIS — R29898 Other symptoms and signs involving the musculoskeletal system: Secondary | ICD-10-CM | POA: Insufficient documentation

## 2018-11-14 DIAGNOSIS — D696 Thrombocytopenia, unspecified: Secondary | ICD-10-CM | POA: Diagnosis not present

## 2018-11-14 DIAGNOSIS — Z1329 Encounter for screening for other suspected endocrine disorder: Secondary | ICD-10-CM | POA: Diagnosis not present

## 2018-11-14 DIAGNOSIS — N400 Enlarged prostate without lower urinary tract symptoms: Secondary | ICD-10-CM | POA: Diagnosis not present

## 2018-11-14 DIAGNOSIS — E782 Mixed hyperlipidemia: Secondary | ICD-10-CM | POA: Diagnosis not present

## 2018-11-14 DIAGNOSIS — J45909 Unspecified asthma, uncomplicated: Secondary | ICD-10-CM | POA: Diagnosis not present

## 2018-11-14 DIAGNOSIS — Z13220 Encounter for screening for lipoid disorders: Secondary | ICD-10-CM | POA: Diagnosis not present

## 2018-11-14 DIAGNOSIS — Z125 Encounter for screening for malignant neoplasm of prostate: Secondary | ICD-10-CM | POA: Diagnosis not present

## 2018-11-14 DIAGNOSIS — E8589 Other amyloidosis: Secondary | ICD-10-CM | POA: Diagnosis not present

## 2018-11-14 DIAGNOSIS — K219 Gastro-esophageal reflux disease without esophagitis: Secondary | ICD-10-CM | POA: Diagnosis not present

## 2018-11-14 DIAGNOSIS — I10 Essential (primary) hypertension: Secondary | ICD-10-CM | POA: Diagnosis not present

## 2018-11-17 DIAGNOSIS — M79604 Pain in right leg: Secondary | ICD-10-CM | POA: Diagnosis not present

## 2018-11-17 DIAGNOSIS — R29898 Other symptoms and signs involving the musculoskeletal system: Secondary | ICD-10-CM | POA: Diagnosis not present

## 2018-11-17 DIAGNOSIS — M79605 Pain in left leg: Secondary | ICD-10-CM | POA: Diagnosis not present

## 2018-11-25 DIAGNOSIS — G629 Polyneuropathy, unspecified: Secondary | ICD-10-CM | POA: Diagnosis not present

## 2018-11-25 DIAGNOSIS — M4316 Spondylolisthesis, lumbar region: Secondary | ICD-10-CM | POA: Diagnosis not present

## 2018-11-25 DIAGNOSIS — M48062 Spinal stenosis, lumbar region with neurogenic claudication: Secondary | ICD-10-CM | POA: Diagnosis not present

## 2018-11-25 DIAGNOSIS — Z8612 Personal history of poliomyelitis: Secondary | ICD-10-CM | POA: Diagnosis not present

## 2018-11-25 DIAGNOSIS — M47816 Spondylosis without myelopathy or radiculopathy, lumbar region: Secondary | ICD-10-CM | POA: Diagnosis not present

## 2018-11-28 DIAGNOSIS — M79604 Pain in right leg: Secondary | ICD-10-CM | POA: Diagnosis not present

## 2018-11-28 DIAGNOSIS — M79605 Pain in left leg: Secondary | ICD-10-CM | POA: Diagnosis not present

## 2018-11-28 DIAGNOSIS — M5416 Radiculopathy, lumbar region: Secondary | ICD-10-CM | POA: Diagnosis not present

## 2018-11-28 DIAGNOSIS — Z0000 Encounter for general adult medical examination without abnormal findings: Secondary | ICD-10-CM | POA: Diagnosis not present

## 2018-11-28 DIAGNOSIS — M48062 Spinal stenosis, lumbar region with neurogenic claudication: Secondary | ICD-10-CM | POA: Diagnosis not present

## 2018-11-28 DIAGNOSIS — J45909 Unspecified asthma, uncomplicated: Secondary | ICD-10-CM | POA: Diagnosis not present

## 2018-11-28 DIAGNOSIS — G14 Postpolio syndrome: Secondary | ICD-10-CM | POA: Diagnosis not present

## 2018-11-28 DIAGNOSIS — M9983 Other biomechanical lesions of lumbar region: Secondary | ICD-10-CM | POA: Diagnosis not present

## 2018-11-28 DIAGNOSIS — E786 Lipoprotein deficiency: Secondary | ICD-10-CM | POA: Diagnosis not present

## 2018-11-28 DIAGNOSIS — E8589 Other amyloidosis: Secondary | ICD-10-CM | POA: Diagnosis not present

## 2018-11-28 DIAGNOSIS — K219 Gastro-esophageal reflux disease without esophagitis: Secondary | ICD-10-CM | POA: Diagnosis not present

## 2018-11-28 DIAGNOSIS — D649 Anemia, unspecified: Secondary | ICD-10-CM | POA: Diagnosis not present

## 2018-11-28 DIAGNOSIS — M5136 Other intervertebral disc degeneration, lumbar region: Secondary | ICD-10-CM | POA: Diagnosis not present

## 2018-12-12 DIAGNOSIS — M48062 Spinal stenosis, lumbar region with neurogenic claudication: Secondary | ICD-10-CM | POA: Diagnosis not present

## 2018-12-12 DIAGNOSIS — J069 Acute upper respiratory infection, unspecified: Secondary | ICD-10-CM | POA: Diagnosis not present

## 2018-12-16 DIAGNOSIS — M48062 Spinal stenosis, lumbar region with neurogenic claudication: Secondary | ICD-10-CM | POA: Diagnosis not present

## 2018-12-18 DIAGNOSIS — M48062 Spinal stenosis, lumbar region with neurogenic claudication: Secondary | ICD-10-CM | POA: Diagnosis not present

## 2018-12-23 DIAGNOSIS — M48062 Spinal stenosis, lumbar region with neurogenic claudication: Secondary | ICD-10-CM | POA: Diagnosis not present

## 2018-12-24 DIAGNOSIS — D1800 Hemangioma unspecified site: Secondary | ICD-10-CM | POA: Diagnosis not present

## 2018-12-24 DIAGNOSIS — D229 Melanocytic nevi, unspecified: Secondary | ICD-10-CM | POA: Diagnosis not present

## 2018-12-24 DIAGNOSIS — L578 Other skin changes due to chronic exposure to nonionizing radiation: Secondary | ICD-10-CM | POA: Diagnosis not present

## 2018-12-24 DIAGNOSIS — Z1283 Encounter for screening for malignant neoplasm of skin: Secondary | ICD-10-CM | POA: Diagnosis not present

## 2018-12-24 DIAGNOSIS — D485 Neoplasm of uncertain behavior of skin: Secondary | ICD-10-CM | POA: Diagnosis not present

## 2018-12-24 DIAGNOSIS — L812 Freckles: Secondary | ICD-10-CM | POA: Diagnosis not present

## 2018-12-24 DIAGNOSIS — L719 Rosacea, unspecified: Secondary | ICD-10-CM | POA: Diagnosis not present

## 2018-12-24 DIAGNOSIS — L918 Other hypertrophic disorders of the skin: Secondary | ICD-10-CM | POA: Diagnosis not present

## 2018-12-24 DIAGNOSIS — D225 Melanocytic nevi of trunk: Secondary | ICD-10-CM | POA: Diagnosis not present

## 2018-12-24 DIAGNOSIS — L821 Other seborrheic keratosis: Secondary | ICD-10-CM | POA: Diagnosis not present

## 2018-12-24 DIAGNOSIS — D2230 Melanocytic nevi of unspecified part of face: Secondary | ICD-10-CM | POA: Diagnosis not present

## 2018-12-25 DIAGNOSIS — M48062 Spinal stenosis, lumbar region with neurogenic claudication: Secondary | ICD-10-CM | POA: Diagnosis not present

## 2018-12-26 DIAGNOSIS — M48062 Spinal stenosis, lumbar region with neurogenic claudication: Secondary | ICD-10-CM | POA: Diagnosis not present

## 2018-12-26 DIAGNOSIS — E8589 Other amyloidosis: Secondary | ICD-10-CM | POA: Diagnosis not present

## 2018-12-26 DIAGNOSIS — J0111 Acute recurrent frontal sinusitis: Secondary | ICD-10-CM | POA: Diagnosis not present

## 2018-12-26 DIAGNOSIS — G14 Postpolio syndrome: Secondary | ICD-10-CM | POA: Diagnosis not present

## 2018-12-26 DIAGNOSIS — K219 Gastro-esophageal reflux disease without esophagitis: Secondary | ICD-10-CM | POA: Diagnosis not present

## 2018-12-29 DIAGNOSIS — M48062 Spinal stenosis, lumbar region with neurogenic claudication: Secondary | ICD-10-CM | POA: Diagnosis not present

## 2019-01-12 ENCOUNTER — Other Ambulatory Visit: Payer: Self-pay

## 2019-01-12 ENCOUNTER — Ambulatory Visit
Admission: RE | Admit: 2019-01-12 | Discharge: 2019-01-12 | Disposition: A | Discharge status: Home / Self Care | Payer: PPO | Source: Ambulatory Visit | Attending: Neurosurgery | Admitting: Neurosurgery

## 2019-01-12 ENCOUNTER — Encounter
Admission: RE | Admit: 2019-01-12 | Discharge: 2019-01-12 | Disposition: A | Discharge status: Home / Self Care | Payer: PPO | Source: Ambulatory Visit | Attending: Neurosurgery | Admitting: Neurosurgery

## 2019-01-12 DIAGNOSIS — Z01810 Encounter for preprocedural cardiovascular examination: Secondary | ICD-10-CM | POA: Diagnosis not present

## 2019-01-12 DIAGNOSIS — Z01818 Encounter for other preprocedural examination: Secondary | ICD-10-CM | POA: Diagnosis not present

## 2019-01-12 HISTORY — DX: Other allergic rhinitis: J30.89

## 2019-01-12 HISTORY — DX: Cardiac arrhythmia, unspecified: I49.9

## 2019-01-12 HISTORY — DX: Anemia, unspecified: D64.9

## 2019-01-12 HISTORY — DX: Other abnormalities of heart beat: R00.8

## 2019-01-12 HISTORY — DX: Other intervertebral disc degeneration, lumbar region: M51.36

## 2019-01-12 HISTORY — DX: Other intervertebral disc degeneration, lumbar region without mention of lumbar back pain or lower extremity pain: M51.369

## 2019-01-12 HISTORY — DX: Pain, unspecified: R52

## 2019-01-12 LAB — URINALYSIS, COMPLETE (UACMP) WITH MICROSCOPIC
Bacteria, UA: NONE SEEN
Bilirubin Urine: NEGATIVE
Glucose, UA: NEGATIVE mg/dL
Hgb urine dipstick: NEGATIVE
Ketones, ur: NEGATIVE mg/dL
Leukocytes, UA: NEGATIVE
Nitrite: NEGATIVE
Protein, ur: NEGATIVE mg/dL
Specific Gravity, Urine: 1.009 (ref 1.005–1.030)
pH: 6 (ref 5.0–8.0)

## 2019-01-12 LAB — CBC
HCT: 40.1 % (ref 39.0–52.0)
Hemoglobin: 13.6 g/dL (ref 13.0–17.0)
MCH: 30.6 pg (ref 26.0–34.0)
MCHC: 33.9 g/dL (ref 30.0–36.0)
MCV: 90.1 fL (ref 80.0–100.0)
NRBC: 0 % (ref 0.0–0.2)
Platelets: 113 10*3/uL — ABNORMAL LOW (ref 150–400)
RBC: 4.45 MIL/uL (ref 4.22–5.81)
RDW: 13.8 % (ref 11.5–15.5)
WBC: 5.1 10*3/uL (ref 4.0–10.5)

## 2019-01-12 LAB — BASIC METABOLIC PANEL
Anion gap: 6 (ref 5–15)
BUN: 15 mg/dL (ref 8–23)
CO2: 24 mmol/L (ref 22–32)
Calcium: 9 mg/dL (ref 8.9–10.3)
Chloride: 108 mmol/L (ref 98–111)
Creatinine, Ser: 0.66 mg/dL (ref 0.61–1.24)
GFR calc non Af Amer: >60 mL/min (ref >60)
Glucose, Bld: 125 mg/dL — ABNORMAL HIGH (ref 70–99)
Potassium: 3.8 mmol/L (ref 3.5–5.1)
Sodium: 138 mmol/L (ref 135–145)

## 2019-01-12 LAB — TYPE AND SCREEN
ABO/RH(D): O NEG
Antibody Screen: NEGATIVE

## 2019-01-12 LAB — SURGICAL PCR SCREEN
MRSA, PCR: NEGATIVE
STAPHYLOCOCCUS AUREUS: NEGATIVE

## 2019-01-12 LAB — PROTIME-INR
INR: 1
Prothrombin Time: 13.1 seconds (ref 11.4–15.2)

## 2019-01-12 LAB — APTT: aPTT: 29 seconds (ref 24–36)

## 2019-01-12 NOTE — Patient Instructions (Addendum)
Your procedure is scheduled on: 01/26/19 Mon Report to Same Day Surgery 2nd floor medical mall [facility] Entrance-take elevator on left to 2nd floor.  Check in with surgery information desk.) To find out your arrival time please call [phone] between 1PM - 3PM on 01/23/19 Fri  Remember: Instructions that are not followed completely may result in serious medical risk, up to and including death, or upon the discretion of your surgeon and anesthesiologist your surgery may need to be rescheduled.    _x___ 1. Do not eat food after midnight the night before your procedure. You may drink clear liquids up to 2 hours before you are scheduled to arrive at the hospital for your procedure.  Do not drink clear liquids within 2 hours of your scheduled arrival to the hospital.  Clear liquids include  --Water or Apple juice without pulp  --Clear carbohydrate beverage such as ClearFast or Gatorade  --Black Coffee or Clear Tea (No milk, no creamers, do not add anything to                  the coffee or Tea Type 1 and type 2 diabetics should only drink water.   ____Ensure clear carbohydrate drink on the way to the hospital for bariatric patients  ____Ensure clear carbohydrate drink 3 hours before surgery for Dr Dwyane Luo patients if physician instructed.   No gum chewing or hard candies.     __x__ 2. No Alcohol for 24 hours before or after surgery.   __x__3. No Smoking or e-cigarettes for 24 prior to surgery.  Do not use any chewable tobacco products for at least 6 hour prior to surgery   ____  4. Bring all medications with you on the day of surgery if instructed.    __x__ 5. Notify your doctor if there is any change in your medical condition     (cold, fever, infections).    x___6. On the morning of surgery brush your teeth with toothpaste and water.  You may rinse your mouth with mouth wash if you wish.  Do not swallow any toothpaste or mouthwash.   Do not wear jewelry, make-up, hairpins,  clips or nail polish.  Do not wear lotions, powders, or perfumes. You may wear deodorant.  Do not shave 48 hours prior to surgery. Men may shave face and neck.  Do not bring valuables to the hospital.    [facility] is not responsible for any belongings or valuables.               Contacts, dentures or bridgework may not be worn into surgery.  Leave your suitcase in the car. After surgery it may be brought to your room.  For patients admitted to the hospital, discharge time is determined by your                       treatment team.  _  Patients discharged the day of surgery will not be allowed to drive home.  You will need someone to drive you home and stay with you the night of your procedure.    Please read over the following fact sheets that you were given:   [facility] Preparing for Surgery and or MRSA Information   _x___ Take anti-hypertensive listed below, cardiac, seizure, asthma,     anti-reflux and psychiatric medicines. These include:  1. finasteride (PROSCAR) 5 MG tablet  2.gabapentin (NEURONTIN) 100 MG capsule  3.omeprazole (PRILOSEC) 20 MG capsule  4.NASACORT  ALLERGY 24HR) 55 MCG/ACT AERO nasal inhaler  5.  6.  ____Fleets enema or Magnesium Citrate as directed.   _x___ Use CHG Soap or sage wipes as directed on instruction sheet   ____ Use inhalers on the day of surgery and bring to hospital day of surgery  ____ Stop Metformin and Janumet 2 days prior to surgery.    ____ Take 1/2 of usual insulin dose the night before surgery and none on the morning     surgery.   _x___ Follow recommendations from Cardiologist, Pulmonologist or PCP regarding          stopping Aspirin, Coumadin, Plavix ,Eliquis, Effient, or Pradaxa, and Pletal.  X____Stop Anti-inflammatories such as Advil, Aleve, Ibuprofen, Motrin, Naproxen, Naprosyn, Goodies powders or aspirin products. OK to take Tylenol and                          Celebrex.   _x___ Stop supplements until after surgery.  But may  continue Vitamin D, Vitamin B,       and multivitamin. Stop vitamin e and fish oil 1 week before surgery.   ____ Bring C-Pap to the hospital.

## 2019-01-14 ENCOUNTER — Telehealth: Payer: Self-pay | Admitting: Cardiovascular Disease

## 2019-01-14 NOTE — Pre-Procedure Instructions (Signed)
EKG OK BY DR Assunta Gambles AND LM FOR [facility] AT DR [facility] OFFICE

## 2019-01-14 NOTE — Telephone Encounter (Signed)
   [facility] Pre-operative Risk Assessment    Request for surgical clearance:  1. What type of surgery is being performed? L3-4 decompression  When is this surgery scheduled?  01/26/19  2. What type of clearance is required (medical clearance vs. Pharmacy clearance to hold med vs. Both)? Not listed  3. Are there any medications that need to be held prior to surgery and how long? Not listed  4. Practice name and name of physician performing surgery? [facility] , Dr. Deetta Perla  5. What is your office phone number [phone]   7.   What is your office fax number [phone]  8.   Anesthesia type (None, local, MAC, general) ? Not listed   Eugene Martinez 01/14/2019, 9:37 AM  _________________________________________________________________   (provider comments below)

## 2019-01-19 ENCOUNTER — Telehealth: Payer: Self-pay | Admitting: Oncology

## 2019-01-19 ENCOUNTER — Inpatient Hospital Stay: Payer: PPO | Admitting: Oncology

## 2019-01-19 ENCOUNTER — Inpatient Hospital Stay: Payer: PPO | Attending: Oncology

## 2019-01-19 VITALS — BP 141/77 | HR 89 | Temp 98.0°F | Resp 19 | Ht 66.0 in | Wt 168.4 lb

## 2019-01-19 DIAGNOSIS — D696 Thrombocytopenia, unspecified: Secondary | ICD-10-CM | POA: Diagnosis not present

## 2019-01-19 DIAGNOSIS — I429 Cardiomyopathy, unspecified: Secondary | ICD-10-CM

## 2019-01-19 DIAGNOSIS — E8581 Light chain (AL) amyloidosis: Secondary | ICD-10-CM | POA: Insufficient documentation

## 2019-01-19 LAB — CMP (CANCER CENTER ONLY)
ALBUMIN: 4.1 g/dL (ref 3.5–5.0)
ALT: 36 U/L (ref 0–44)
AST: 37 U/L (ref 15–41)
Alkaline Phosphatase: 53 U/L (ref 38–126)
Anion gap: 8 (ref 5–15)
BUN: 18 mg/dL (ref 8–23)
CO2: 25 mmol/L (ref 22–32)
Calcium: 9.3 mg/dL (ref 8.9–10.3)
Chloride: 106 mmol/L (ref 98–111)
Creatinine: 0.82 mg/dL (ref 0.61–1.24)
GFR, Est AFR Am: >60 mL/min (ref >60)
GFR, Estimated: >60 mL/min (ref >60)
GLUCOSE: 118 mg/dL — AB (ref 70–99)
Potassium: 4.4 mmol/L (ref 3.5–5.1)
Sodium: 139 mmol/L (ref 135–145)
Total Bilirubin: 0.8 mg/dL (ref 0.3–1.2)
Total Protein: 6.8 g/dL (ref 6.5–8.1)

## 2019-01-19 LAB — CBC WITH DIFFERENTIAL (CANCER CENTER ONLY)
Abs Immature Granulocytes: 0.02 10*3/uL (ref 0.00–0.07)
BASOS PCT: 1 %
Basophils Absolute: 0 10*3/uL (ref 0.0–0.1)
Eosinophils Absolute: 0.2 10*3/uL (ref 0.0–0.5)
Eosinophils Relative: 3 %
HCT: 40 % (ref 39.0–52.0)
Hemoglobin: 13.8 g/dL (ref 13.0–17.0)
Immature Granulocytes: 0 %
Lymphocytes Relative: 23 %
Lymphs Abs: 1.3 10*3/uL (ref 0.7–4.0)
MCH: 31.1 pg (ref 26.0–34.0)
MCHC: 34.5 g/dL (ref 30.0–36.0)
MCV: 90.1 fL (ref 80.0–100.0)
Monocytes Absolute: 0.7 10*3/uL (ref 0.1–1.0)
Monocytes Relative: 12 %
Neutro Abs: 3.5 10*3/uL (ref 1.7–7.7)
Neutrophils Relative %: 61 %
Platelet Count: 122 10*3/uL — ABNORMAL LOW (ref 150–400)
RBC: 4.44 MIL/uL (ref 4.22–5.81)
RDW: 13.5 % (ref 11.5–15.5)
WBC: 5.7 10*3/uL (ref 4.0–10.5)
nRBC: 0 % (ref 0.0–0.2)

## 2019-01-19 NOTE — Telephone Encounter (Signed)
Cardiac clearance letter faxed to Dr Deetta Perla.

## 2019-01-19 NOTE — Telephone Encounter (Signed)
   Primary Cardiologist: Ida Rogue, MD  Chart reviewed as part of pre-operative protocol coverage. Patient was contacted 01/19/2019 in reference to pre-operative risk assessment for pending surgery as outlined below.  Eugene Martinez. was last seen on 09/16/2018 by Dr. Rockey Situ.  Since that day, Eugene Martinez. has done well without any chest pain or shortness of breath. He is able to complete 4 METS of activity without any chest pain or shortness of breath.   Therefore, based on ACC/AHA guidelines, the patient would be at acceptable risk for the planned procedure without further cardiovascular testing.   I will route this recommendation to the requesting party via Epic fax function and remove from pre-op pool.  Please call with questions.   Given his prior history of severe left ventricular hypertrophy and grade 2 diastolic dysfunction, he is at higher risk of heart failure with aggressive IV hydration. Be careful with the amount of IV hydration during perioperative phase.    [place], [place] 01/19/2019, 1:42 PM

## 2019-01-19 NOTE — Progress Notes (Signed)
  [place] OFFICE PROGRESS NOTE   Diagnosis: Amyloidosis  INTERVAL HISTORY:   [name] returns for a scheduled visit.  No leg edema.  He has developed bilateral foot numbness and leg weakness over the past several months.  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 He is scheduled for decompression surgery next week.  No numbness in the upper extremities. Objective:  Vital signs in last 24 hours:  Blood pressure (!) 141/77, pulse 89, temperature 98 F (36.7 C), temperature source Oral, resp. rate 19, height 5\' 6"  (1.676 m), weight 168 lb 6.4 oz (76.4 kg), SpO2 98 %.    HEENT: No macroglossia Resp: Lungs clear bilaterally Cardio: Regular rate and rhythm GI: No hepatosplenomegaly Vascular: No leg edema   Lab Results:  Lab Results  Component Value Date   WBC 5.7 01/19/2019   HGB 13.8 01/19/2019   HCT 40.0 01/19/2019   MCV 90.1 01/19/2019   PLT 122 (L) 01/19/2019   NEUTROABS 3.5 01/19/2019    CMP  Lab Results  Component Value Date   NA 138 01/12/2019   K 3.8 01/12/2019   CL 108 01/12/2019   CO2 24 01/12/2019   GLUCOSE 125 (H) 01/12/2019   BUN 15 01/12/2019   CREATININE 0.66 01/12/2019   CALCIUM 9.0 01/12/2019   PROT 6.7 07/21/2018   ALBUMIN 3.8 07/21/2018   AST 25 07/21/2018   ALT 25 07/21/2018   ALKPHOS 56 07/21/2018   BILITOT 0.8 07/21/2018   GFRNONAA >60 01/12/2019   GFRAA >60 01/12/2019  07/21/2018: Kappa free light chains 22, lambda free light chains 32.9  24-hour urine total protein 07/21/2018: 74, no urine M spike Medications: I have reviewed the patient's current medications.   Assessment/Plan: 1. AL Amyloidosis, lambda light chain, diagnosed in December 2013-most recently treated with every 2 week Velcade/Decadron, last given 03/19/2014   Status post autologous stem cell collection at [facility] 04/06/2014  Urine protein improved and serum light chains stable 05/08/2016  Urine protein stable, light chains  stable 07/22/2018 2. 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 Followed bycardiology, echocardiogram September 2015 with an LVEF of 65-70% and severe LVH 4. 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 Zoster rash December 2016      Disposition: Eugene Martinez is unchanged.  No clinical evidence for progression of the amyloidosis.  We will follow-up on the 24-hour urine and serum light chains from today.  He will return for office and lab visit in 6 months.  He is scheduled to undergo lumbar decompression surgery next week.  Betsy Coder, MD  01/19/2019  11:58 AM

## 2019-01-19 NOTE — Telephone Encounter (Signed)
Scheduled appt per 02/10 los. ° °Printed calendar and avs. °

## 2019-01-19 NOTE — Telephone Encounter (Signed)
Left message for patient to call back and speak to on call preop APP.

## 2019-01-20 LAB — UPEP/UIFE/LIGHT CHAINS/TP, 24-HR UR
% BETA, Urine: 31.2 %
ALBUMIN, U: 45.1 %
ALPHA 1 URINE: 6.9 %
Alpha 2, Urine: 10.2 %
Free Kappa Lt Chains,Ur: 7.77 mg/L (ref 0.63–113.79)
Free Kappa/Lambda Ratio: 6.53 (ref 1.03–31.76)
Free Lambda Lt Chains,Ur: 1.19 mg/L (ref 0.47–11.77)
GAMMA GLOBULIN URINE: 6.6 %
Total Protein, Urine-Ur/day: 72 mg/24 hr (ref 30–150)
Total Protein, Urine: 5.4 mg/dL
Total Volume: 1325

## 2019-01-20 LAB — PROTEIN ELECTROPHORESIS, SERUM
A/G Ratio: 1.5 (ref 0.7–1.7)
ALPHA-2-GLOBULIN: 0.7 g/dL (ref 0.4–1.0)
Albumin ELP: 3.8 g/dL (ref 2.9–4.4)
Alpha-1-Globulin: 0.1 g/dL (ref 0.0–0.4)
BETA GLOBULIN: 0.9 g/dL (ref 0.7–1.3)
GAMMA GLOBULIN: 0.9 g/dL (ref 0.4–1.8)
Globulin, Total: 2.6 g/dL (ref 2.2–3.9)
Total Protein ELP: 6.4 g/dL (ref 6.0–8.5)

## 2019-01-20 LAB — KAPPA/LAMBDA LIGHT CHAINS
Kappa free light chain: 17.8 mg/L (ref 3.3–19.4)
Kappa, lambda light chain ratio: 1.01 (ref 0.26–1.65)
Lambda free light chains: 17.6 mg/L (ref 5.7–26.3)

## 2019-01-20 NOTE — Pre-Procedure Instructions (Signed)
CLEARANCE NOTE ON CHART . ACCEPTABLE RISK. SEE NOTE FOR IV HYDRATION RECOMMENDATIONS PERIOP

## 2019-01-26 ENCOUNTER — Other Ambulatory Visit: Payer: Self-pay

## 2019-01-26 ENCOUNTER — Ambulatory Visit: Payer: PPO | Admitting: Anesthesiology

## 2019-01-26 ENCOUNTER — Observation Stay
Admission: RE | Admit: 2019-01-26 | Discharge: 2019-01-26 | Disposition: A | Discharge status: Home / Self Care | Payer: PPO | Attending: Neurosurgery | Admitting: Neurosurgery

## 2019-01-26 ENCOUNTER — Ambulatory Visit: Payer: PPO

## 2019-01-26 ENCOUNTER — Encounter
Admission: RE | Disposition: A | Discharge status: Home / Self Care | Payer: Self-pay | Source: Home / Self Care | Attending: Neurosurgery

## 2019-01-26 ENCOUNTER — Encounter: Payer: Self-pay | Admitting: *Deleted

## 2019-01-26 DIAGNOSIS — Z419 Encounter for procedure for purposes other than remedying health state, unspecified: Secondary | ICD-10-CM

## 2019-01-26 DIAGNOSIS — I5032 Chronic diastolic (congestive) heart failure: Secondary | ICD-10-CM | POA: Diagnosis not present

## 2019-01-26 DIAGNOSIS — M48062 Spinal stenosis, lumbar region with neurogenic claudication: Secondary | ICD-10-CM | POA: Diagnosis not present

## 2019-01-26 DIAGNOSIS — K219 Gastro-esophageal reflux disease without esophagitis: Secondary | ICD-10-CM | POA: Insufficient documentation

## 2019-01-26 DIAGNOSIS — I509 Heart failure, unspecified: Secondary | ICD-10-CM | POA: Insufficient documentation

## 2019-01-26 DIAGNOSIS — Z9889 Other specified postprocedural states: Secondary | ICD-10-CM

## 2019-01-26 DIAGNOSIS — Z8612 Personal history of poliomyelitis: Secondary | ICD-10-CM | POA: Insufficient documentation

## 2019-01-26 DIAGNOSIS — I080 Rheumatic disorders of both mitral and aortic valves: Secondary | ICD-10-CM | POA: Insufficient documentation

## 2019-01-26 DIAGNOSIS — J45909 Unspecified asthma, uncomplicated: Secondary | ICD-10-CM | POA: Diagnosis not present

## 2019-01-26 DIAGNOSIS — D649 Anemia, unspecified: Secondary | ICD-10-CM | POA: Insufficient documentation

## 2019-01-26 DIAGNOSIS — M1990 Unspecified osteoarthritis, unspecified site: Secondary | ICD-10-CM | POA: Diagnosis not present

## 2019-01-26 DIAGNOSIS — Z981 Arthrodesis status: Secondary | ICD-10-CM | POA: Diagnosis not present

## 2019-01-26 DIAGNOSIS — I447 Left bundle-branch block, unspecified: Secondary | ICD-10-CM | POA: Diagnosis not present

## 2019-01-26 DIAGNOSIS — N400 Enlarged prostate without lower urinary tract symptoms: Secondary | ICD-10-CM | POA: Insufficient documentation

## 2019-01-26 DIAGNOSIS — Z7982 Long term (current) use of aspirin: Secondary | ICD-10-CM | POA: Insufficient documentation

## 2019-01-26 DIAGNOSIS — E8581 Light chain (AL) amyloidosis: Secondary | ICD-10-CM | POA: Insufficient documentation

## 2019-01-26 DIAGNOSIS — Z79899 Other long term (current) drug therapy: Secondary | ICD-10-CM | POA: Insufficient documentation

## 2019-01-26 DIAGNOSIS — I110 Hypertensive heart disease with heart failure: Secondary | ICD-10-CM | POA: Diagnosis not present

## 2019-01-26 HISTORY — PX: LUMBAR LAMINECTOMY/DECOMPRESSION MICRODISCECTOMY: SHX5026

## 2019-01-26 LAB — ABO/RH: ABO/RH(D): O NEG

## 2019-01-26 SURGERY — LUMBAR LAMINECTOMY/DECOMPRESSION MICRODISCECTOMY 1 LEVEL
Anesthesia: General

## 2019-01-26 MED ORDER — BUPIVACAINE HCL (PF) 0.5 % IJ SOLN
INTRAMUSCULAR | Status: AC
  Filled 2019-01-26: qty 30

## 2019-01-26 MED ORDER — KETAMINE HCL 10 MG/ML IJ SOLN
INTRAMUSCULAR | Status: DC | PRN
  Administered 2019-01-26: 40 mg via INTRAVENOUS

## 2019-01-26 MED ORDER — ONDANSETRON HCL 4 MG/2ML IJ SOLN
INTRAMUSCULAR | Status: DC | PRN
  Administered 2019-01-26: 4 mg via INTRAVENOUS

## 2019-01-26 MED ORDER — EPHEDRINE SULFATE 50 MG/ML IJ SOLN
INTRAMUSCULAR | Status: AC
  Filled 2019-01-26: qty 1

## 2019-01-26 MED ORDER — DEXAMETHASONE SODIUM PHOSPHATE 10 MG/ML IJ SOLN
INTRAMUSCULAR | Status: DC | PRN
  Administered 2019-01-26: 8 mg via INTRAVENOUS

## 2019-01-26 MED ORDER — THROMBIN 5000 UNITS EX SOLR
CUTANEOUS | Status: AC
  Filled 2019-01-26: qty 5000

## 2019-01-26 MED ORDER — OXYCODONE HCL 5 MG PO TABS: 5.0000 mg | ORAL_TABLET | ORAL | 0 refills | Status: DC | PRN

## 2019-01-26 MED ORDER — ROCURONIUM BROMIDE 100 MG/10ML IV SOLN
INTRAVENOUS | Status: DC | PRN
  Administered 2019-01-26: 30 mg via INTRAVENOUS

## 2019-01-26 MED ORDER — FENTANYL CITRATE (PF) 100 MCG/2ML IJ SOLN
INTRAMUSCULAR | Status: DC | PRN
  Administered 2019-01-26: 75 ug via INTRAVENOUS

## 2019-01-26 MED ORDER — CEFAZOLIN SODIUM-DEXTROSE 2-4 GM/100ML-% IV SOLN
2.0000 g | Freq: Once | INTRAVENOUS | Status: AC
  Administered 2019-01-26: 2 g via INTRAVENOUS

## 2019-01-26 MED ORDER — PROPOFOL 10 MG/ML IV BOLUS
INTRAVENOUS | Status: DC | PRN
  Administered 2019-01-26: 100 mg via INTRAVENOUS

## 2019-01-26 MED ORDER — SODIUM CHLORIDE 0.9 % IR SOLN
Status: DC | PRN
  Administered 2019-01-26: 1000 mL

## 2019-01-26 MED ORDER — PROPOFOL 10 MG/ML IV BOLUS
INTRAVENOUS | Status: AC
  Filled 2019-01-26: qty 20

## 2019-01-26 MED ORDER — SODIUM CHLORIDE FLUSH 0.9 % IV SOLN
INTRAVENOUS | Status: AC
  Filled 2019-01-26: qty 20

## 2019-01-26 MED ORDER — LIDOCAINE-EPINEPHRINE (PF) 1 %-1:200000 IJ SOLN
INTRAMUSCULAR | Status: DC | PRN
  Administered 2019-01-26 (×2): 10 mL

## 2019-01-26 MED ORDER — SODIUM CHLORIDE (PF) 0.9 % IJ SOLN
INTRAMUSCULAR | Status: AC
  Filled 2019-01-26: qty 30

## 2019-01-26 MED ORDER — PHENYLEPHRINE HCL 10 MG/ML IJ SOLN
INTRAMUSCULAR | Status: DC | PRN
  Administered 2019-01-26 (×12): 100 ug via INTRAVENOUS

## 2019-01-26 MED ORDER — LIDOCAINE HCL (CARDIAC) PF 100 MG/5ML IV SOSY
PREFILLED_SYRINGE | INTRAVENOUS | Status: DC | PRN
  Administered 2019-01-26: 80 mg via INTRAVENOUS

## 2019-01-26 MED ORDER — FENTANYL CITRATE (PF) 250 MCG/5ML IJ SOLN
INTRAMUSCULAR | Status: AC
  Filled 2019-01-26: qty 5

## 2019-01-26 MED ORDER — KETAMINE HCL 50 MG/ML IJ SOLN
INTRAMUSCULAR | Status: AC
  Filled 2019-01-26: qty 10

## 2019-01-26 MED ORDER — SUCCINYLCHOLINE CHLORIDE 20 MG/ML IJ SOLN
INTRAMUSCULAR | Status: DC | PRN
  Administered 2019-01-26: 100 mg via INTRAVENOUS

## 2019-01-26 MED ORDER — DEXAMETHASONE SODIUM PHOSPHATE 10 MG/ML IJ SOLN
INTRAMUSCULAR | Status: AC
  Filled 2019-01-26: qty 1

## 2019-01-26 MED ORDER — SUGAMMADEX SODIUM 200 MG/2ML IV SOLN
INTRAVENOUS | Status: DC | PRN
  Administered 2019-01-26: 50 mg via INTRAVENOUS

## 2019-01-26 MED ORDER — SUCCINYLCHOLINE CHLORIDE 20 MG/ML IJ SOLN
INTRAMUSCULAR | Status: AC
  Filled 2019-01-26: qty 1

## 2019-01-26 MED ORDER — CEFAZOLIN SODIUM-DEXTROSE 2-4 GM/100ML-% IV SOLN
INTRAVENOUS | Status: AC
  Filled 2019-01-26: qty 100

## 2019-01-26 MED ORDER — LIDOCAINE-EPINEPHRINE (PF) 1 %-1:200000 IJ SOLN
INTRAMUSCULAR | Status: AC
  Filled 2019-01-26: qty 30

## 2019-01-26 MED ORDER — THROMBIN 5000 UNITS EX SOLR
CUTANEOUS | Status: DC | PRN
  Administered 2019-01-26: 5000 [IU] via TOPICAL

## 2019-01-26 MED ORDER — ONDANSETRON HCL 4 MG/2ML IJ SOLN
INTRAMUSCULAR | Status: AC
  Filled 2019-01-26: qty 2

## 2019-01-26 MED ORDER — METHYLPREDNISOLONE ACETATE 40 MG/ML IJ SUSP
INTRAMUSCULAR | Status: AC
  Filled 2019-01-26: qty 1

## 2019-01-26 MED ORDER — HYDROMORPHONE HCL 1 MG/ML IJ SOLN: 0.2500 mg | INTRAMUSCULAR | Status: DC | PRN

## 2019-01-26 MED ORDER — BACITRACIN 50000 UNITS IM SOLR
INTRAMUSCULAR | Status: AC
  Filled 2019-01-26: qty 1

## 2019-01-26 MED ORDER — ROCURONIUM BROMIDE 50 MG/5ML IV SOLN
INTRAVENOUS | Status: AC
  Filled 2019-01-26: qty 1

## 2019-01-26 MED ORDER — LIDOCAINE HCL (PF) 2 % IJ SOLN
INTRAMUSCULAR | Status: AC
  Filled 2019-01-26: qty 10

## 2019-01-26 MED ORDER — METHOCARBAMOL 500 MG PO TABS: 500.0000 mg | ORAL_TABLET | Freq: Four times a day (QID) | ORAL | 0 refills | Status: DC | PRN

## 2019-01-26 MED ORDER — PHENYLEPHRINE HCL 10 MG/ML IJ SOLN
INTRAMUSCULAR | Status: AC
  Filled 2019-01-26: qty 1

## 2019-01-26 MED ORDER — SODIUM CHLORIDE (PF) 0.9 % IJ SOLN
INTRAMUSCULAR | Status: AC
  Filled 2019-01-26: qty 20

## 2019-01-26 MED ORDER — LACTATED RINGERS IV SOLN
INTRAVENOUS | Status: DC
  Administered 2019-01-26: 07:00:00 via INTRAVENOUS

## 2019-01-26 SURGICAL SUPPLY — 59 items
ADH SKN CLS APL DERMABOND .7 (GAUZE/BANDAGES/DRESSINGS) ×1
AGENT HMST MTR 8 SURGIFLO (HEMOSTASIS) ×1
APL SRG 60D 8 XTD TIP BNDBL (TIP)
BUR NEURO DRILL SOFT 3.0X3.8M (BURR) ×2 IMPLANT
CANISTER SUCT 1200ML W/VALVE (MISCELLANEOUS) ×4 IMPLANT
CHLORAPREP W/TINT 26ML (MISCELLANEOUS) ×4 IMPLANT
COUNTER NEEDLE 20/40 LG (NEEDLE) ×2 IMPLANT
COVER LIGHT HANDLE STERIS (MISCELLANEOUS) ×4 IMPLANT
COVER WAND RF STERILE (DRAPES) IMPLANT
CUP MEDICINE 2OZ PLAST GRAD ST (MISCELLANEOUS) ×2 IMPLANT
DERMABOND ADVANCED (GAUZE/BANDAGES/DRESSINGS) ×1
DERMABOND ADVANCED .7 DNX12 (GAUZE/BANDAGES/DRESSINGS) ×1 IMPLANT
DRAPE C-ARM 42X72 X-RAY (DRAPES) ×4 IMPLANT
DRAPE LAPAROTOMY 100X77 ABD (DRAPES) ×2 IMPLANT
DRAPE MICROSCOPE SPINE 48X150 (DRAPES) IMPLANT
DRAPE SURG 17X11 SM STRL (DRAPES) ×2 IMPLANT
DURASEAL APPLICATOR TIP (TIP) IMPLANT
DURASEAL SPINE SEALANT 3ML (MISCELLANEOUS) IMPLANT
ELECT CAUTERY BLADE TIP 2.5 (TIP) ×2
ELECT EZSTD 165MM 6.5IN (MISCELLANEOUS) ×2
ELECT REM PT RETURN 9FT ADLT (ELECTROSURGICAL) ×2
ELECTRODE CAUTERY BLDE TIP 2.5 (TIP) ×1 IMPLANT
ELECTRODE EZSTD 165MM 6.5IN (MISCELLANEOUS) ×1 IMPLANT
ELECTRODE REM PT RTRN 9FT ADLT (ELECTROSURGICAL) ×1 IMPLANT
GAUZE SPONGE 4X4 12PLY STRL (GAUZE/BANDAGES/DRESSINGS) ×2 IMPLANT
GLOVE BIOGEL PI IND STRL 7.0 (GLOVE) ×1 IMPLANT
GLOVE BIOGEL PI IND STRL 8 (GLOVE) ×1 IMPLANT
GLOVE BIOGEL PI INDICATOR 7.0 (GLOVE) ×1
GLOVE BIOGEL PI INDICATOR 8 (GLOVE) ×1
GLOVE SURG SYN 7.0 (GLOVE) ×4 IMPLANT
GLOVE SURG SYN 7.0 PF PI (GLOVE) ×2 IMPLANT
GLOVE SURG SYN 8.0 (GLOVE) ×6 IMPLANT
GLOVE SURG SYN 8.0 PF PI (GLOVE) ×3 IMPLANT
GOWN STRL REUS W/ TWL XL LVL3 (GOWN DISPOSABLE) ×1 IMPLANT
GOWN STRL REUS W/TWL MED LVL3 (GOWN DISPOSABLE) ×2 IMPLANT
GOWN STRL REUS W/TWL XL LVL3 (GOWN DISPOSABLE) ×2
GRADUATE 1200CC STRL 31836 (MISCELLANEOUS) ×2 IMPLANT
KIT TURNOVER KIT A (KITS) ×2 IMPLANT
KIT WILSON FRAME (KITS) ×2 IMPLANT
KNIFE BAYONET SHORT DISCETOMY (MISCELLANEOUS) IMPLANT
MARKER SKIN DUAL TIP RULER LAB (MISCELLANEOUS) ×4 IMPLANT
NDL SAFETY ECLIPSE 18X1.5 (NEEDLE) ×2 IMPLANT
NEEDLE HYPO 18GX1.5 SHARP (NEEDLE) ×4
NEEDLE HYPO 22GX1.5 SAFETY (NEEDLE) ×2 IMPLANT
NS IRRIG 1000ML POUR BTL (IV SOLUTION) ×2 IMPLANT
PACK LAMINECTOMY NEURO (CUSTOM PROCEDURE TRAY) ×2 IMPLANT
PAD ARMBOARD 7.5X6 YLW CONV (MISCELLANEOUS) ×2 IMPLANT
SPOGE SURGIFLO 8M (HEMOSTASIS) ×1
SPONGE SURGIFLO 8M (HEMOSTASIS) ×1 IMPLANT
STAPLER SKIN PROX 35W (STAPLE) IMPLANT
SUT NURALON 4 0 TR CR/8 (SUTURE) IMPLANT
SUT POLYSORB 2-0 5X18 GS-10 (SUTURE) ×6 IMPLANT
SUT VIC AB 0 CT1 18XCR BRD 8 (SUTURE) ×1 IMPLANT
SUT VIC AB 0 CT1 8-18 (SUTURE) ×2
SYR 10ML LL (SYRINGE) ×4 IMPLANT
SYR 30ML LL (SYRINGE) ×2 IMPLANT
SYR 3ML LL SCALE MARK (SYRINGE) ×2 IMPLANT
TOWEL OR 17X26 4PK STRL BLUE (TOWEL DISPOSABLE) ×8 IMPLANT
TUBING CONNECTING 10 (TUBING) ×2 IMPLANT

## 2019-01-26 NOTE — OR Nursing (Signed)
1300 Ambulated with walker for about 100 feet without pain or difficulty.  DC instructions complete and back brace applied

## 2019-01-26 NOTE — Anesthesia Postprocedure Evaluation (Signed)
Anesthesia Post Note  Patient: Eugene Martinez.  Procedure(s) Performed: LUMBAR LAMINECTOMY/DECOMPRESSION MICRODISCECTOMY 1 LEVEL L3-4 (N/A )  Patient location during evaluation: PACU Anesthesia Type: General Level of consciousness: awake and alert Pain management: pain level controlled Vital Signs Assessment: post-procedure vital signs reviewed and stable Respiratory status: spontaneous breathing, nonlabored ventilation and respiratory function stable Cardiovascular status: blood pressure returned to baseline and stable Postop Assessment: no apparent nausea or vomiting Anesthetic complications: no     Last Vitals:  Vitals:   01/26/19 1242 01/26/19 1346  BP: 137/76 (!) 145/71  Pulse: 85 84  Resp: 16 16  Temp:    SpO2: 96% 98%    Last Pain:  Vitals:   01/26/19 1346  TempSrc:   PainSc: 0-No pain                 Durenda Hurt

## 2019-01-26 NOTE — Progress Notes (Signed)
Ch met with pt and family members present pre-op. Pt wife mentioned that the pastor visited with them the day before surgery for prayer. No needed identified. Ch provided compassionate presence for family and pt.    01/26/19 0700  Clinical Encounter Type  Visited With Patient and family together  Visit Type Psychological support;Spiritual support;Social support;Pre-op  Spiritual Encounters  Spiritual Needs Grief support;Emotional  Stress Factors  Patient Stress Factors None identified  Family Stress Factors None identified

## 2019-01-26 NOTE — H&P (Signed)
History & Physical   Date of Service: 01/26/2019   Time: 6:41 AM  Chief Complaint: Leg pain  History of Present Illness: Ms. Myler returns with leg symptoms consistent with neurogenic claudication. He has undergone 1 lumbar ESI and 2 sessions of physical therapy. He states he does not notice any significant change in his symptoms. He presently denies any back pain or radiating leg pain. He does get some pain in his thighs, especially with walking. This is relieved with sitting. He has been using a cane and walker for assistance. He does complain of some numbness in his feet bilaterally. He has a history of polio with some right leg surgery and wears an ankle brace.  He continues to work as an Optometrist. He does not state that he does a lot of walking during the day.  MRI revealed stenosis at L3/4 that is severe and he is here for surgical decompression  PMH: . Allergic state  . Amyloidosis (CMS-HCC)  . Arthritis May 2018  foot doctor noticed in rt foot  . Asthma  . BPH (benign prostatic hypertrophy)  . GERD (gastroesophageal reflux disease)  . History of cancer January 2014  AL Amyloidosis - Primary  . Polio 1940s  required several surgeries    No Known Drug Allergies  Medications: No current facility-administered medications on file prior to encounter.    Current Outpatient Medications on File Prior to Encounter  Medication Sig Dispense Refill  . aspirin 81 MG chewable tablet Chew 81 mg by mouth daily.    . cholecalciferol (VITAMIN D) 1000 UNITS tablet Take 1,000 Units by mouth at bedtime.     . finasteride (PROSCAR) 5 MG tablet Take 5 mg by mouth every morning.     . iron polysaccharides (FERREX 150) 150 MG capsule daily.    Marland Kitchen levocetirizine (XYZAL) 5 MG tablet Take 5 mg by mouth at bedtime. To relieve allergy symptoms    . montelukast (SINGULAIR) 10 MG tablet Take 10 mg by mouth at bedtime.    . Multiple Vitamins-Minerals (CENTRUM SILVER ULTRA MENS) TABS Take 1 tablet  by mouth every morning.     . Omega-3 Fatty Acids (FISH OIL) 1200 MG CAPS Take 1,200 mg by mouth 2 (two) times daily.     Marland Kitchen omeprazole (PRILOSEC) 20 MG capsule Take 20 mg by mouth daily before breakfast. Takes 30 minutes before breakfast for acid reflux    . torsemide (DEMADEX) 20 MG tablet Take 1 tablet (20 mg total) by mouth 2 (two) times daily. TAKE 1 TABLET BY MOUTH TWO TIMES DAILY. TAKE IF WT. INCREASES 2-5 LBS 60 tablet 0  . vitamin E 400 UNIT capsule Take 400 Units by mouth every morning.     Marland Kitchen azelastine (ASTELIN) 137 MCG/SPRAY nasal spray Place 1-2 sprays into both nostrils every 12 (twelve) hours as needed (allergies).     . mupirocin ointment (BACTROBAN) 2 % Apply topically.    . triamcinolone (NASACORT ALLERGY 24HR) 55 MCG/ACT AERO nasal inhaler Place 2 sprays daily into the nose.    . triamcinolone cream (KENALOG) 0.1 % Apply 1 application topically daily as needed (skin rash).   2    Review of Systems:  General ROS: Negative Respiratory ROS: Negative Cardiovascular ROS: Negative Gastrointestinal ROS: Negative Genito-Urinary ROS: Negative Musculoskeletal ROS: Negative for back pain Neurological ROS: Positive for leg pain Dermatological ROS: Negative    Physical Exam:  Mental status: alertness: alert, affect: normal Speech: fluent and clear CV: Regular rate Pulm: Clear to auscultation  Motor:strength symmetric 5/5 in bilateral hip flexion, knee extension, knee flexion. He is 5 out of 5 in plantarflexion bilaterally. He is 4+ out of 5 in dorsiflexion on the left and 4-/5 on the right Sensory: Appears intact throughout bilateral lower extremities Gait: Slow, wide-based, using walker   Data: MRI lumbar spine: There is some straightening of the lordotic curvature. There is a mild listhesis at L3-4 of a few millimeters. There is ligamentum hypertrophy and facet arthropathy at this level which results in severe central stenosis. There is some mild degenerative disease with  exception of L5-S1 with severe loss of disc space height. There is no obvious stenosis at this level.  Xray lumbar spine with dynamic views: There is no obvious mobile listhesis at L3-4  Assessment & Plan:  Active Problems:   Lumbar stenosis with neurogenic claudication   1.  L3/4 Laminectomies for decompression  VTE Prophylaxis: SCDs  Code Status: Full Code    Malen Gauze, MD

## 2019-01-26 NOTE — Discharge Summary (Signed)
Procedure: L3-4 laminectomy Procedure date: 01/26/2019 Diagnosis: Neurogenic claudication   History: Eugene Martinez. is s/p L3-4 lumbar laminectomy for neurogenic claudication POD0: Tolerated procedure well.  Postoperatively, pain was minimal.  Patient was able to ambulate and void without issue.  Physical Exam: Vitals:   01/26/19 1036 01/26/19 1048  BP: (!) 128/53 136/72  Pulse: 86 84  Resp: 14 16  Temp:  98.2 F (36.8 C)  SpO2: 96% 94%    AA Ox3 Strength:5/5 throughout upper and lower extremities Sensation: Intact and symmetric upper and lower extremities Skin: Glue intact  Data:  No results for input(s): NA, K, CL, CO2, BUN, CREATININE, LABGLOM, GLUCOSE, CALCIUM in the last 168 hours. No results for input(s): AST, ALT, ALKPHOS in the last 168 hours.  Invalid input(s): TBILI   No results for input(s): WBC, HGB, HCT, PLT in the last 168 hours. No results for input(s): APTT, INR in the last 168 hours.       Other tests/results: Imaging reviewed  Assessment/Plan:  Eugene Phoenix. is POD 0 status post L3-4 lumbar decompression for neurogenic claudication.  Pain adequately controlled, he was able to ambulate and void without issue.  We will continue pain control with oxycodone and Robaxin as needed.  Briefly discussed postop instructions including wound care, physical activity restrictions, and medication changes.  He will follow-up in clinic in approximately 2 weeks to monitor progress.  Advised to contact office if any questions or concerns arise before then.  Marin Olp PA-C Department of Neurosurgery

## 2019-01-26 NOTE — Interval H&P Note (Signed)
History and Physical Interval Note:  01/26/2019 6:43 AM  Eugene Martinez.  has presented today for surgery, with the diagnosis of LUMBAR STENOSIS WITH CLAUDICATION  The various methods of treatment have been discussed with the patient and family. After consideration of risks, benefits and other options for treatment, the patient has consented to  Procedure(s): LUMBAR LAMINECTOMY/DECOMPRESSION MICRODISCECTOMY 1 LEVEL L3-4 (N/A) as a surgical intervention .  The patient's history has been reviewed, patient examined, no change in status, stable for surgery.  I have reviewed the patient's chart and labs.  Questions were answered to the patient's satisfaction.     Deetta Perla

## 2019-01-26 NOTE — Transfer of Care (Signed)
Immediate Anesthesia Transfer of Care Note  Patient: Eugene Martinez.  Procedure(s) Performed: LUMBAR LAMINECTOMY/DECOMPRESSION MICRODISCECTOMY 1 LEVEL L3-4 (N/A )  Patient Location: PACU  Anesthesia Type:General  Level of Consciousness: awake and alert   Airway & Oxygen Therapy: Patient Spontanous Breathing and Patient connected to face mask oxygen  Post-op Assessment: Report given to RN and Post -op Vital signs reviewed and stable  Post vital signs: Reviewed and stable  Last Vitals:  Vitals Value Taken Time  BP 135/64 01/26/2019  9:50 AM  Temp 36.3 C 01/26/2019  9:50 AM  Pulse 88 01/26/2019  9:52 AM  Resp 17 01/26/2019  9:52 AM  SpO2 100 % 01/26/2019  9:52 AM  Vitals shown include unvalidated device data.  Last Pain:  Vitals:   01/26/19 0626  TempSrc: Tympanic  PainSc: 1          Complications: No apparent anesthesia complications

## 2019-01-26 NOTE — Op Note (Signed)
Operative Note  SURGERY DATE:01/26/2019  PRE-OP DIAGNOSIS: Lumbar Stenosis with Neurogenic Claudication(m48.06)  POST-OP DIAGNOSIS:Post-Op Diagnosis Codes: Lumbar Stenosiswith Neurogenic Claudication (m48.06)  Procedure(s) with comments: L3/4 Laminectomy, Lateral recess decompression   SURGEON:  * Eugene Gauze, MD   Marin Olp, PA, Assistant  ANESTHESIA:General  OPERATIVE FINDINGS: Hypertrophied Ligament andLateral Recess Stenosis,Compressionat L3/4  OPERATIVE REPORT:   Indication: Eugene Martinez presented to the clinic on 1/7with ongoing bilateralleg numbness and painwith ambulation. He had failed conservative management including PT and prescription medications. MRI revealed stenosis at L3/4.  Xrays showed no dynamic instability.Lumbardecompression was discussed to relieve symptoms.Therisks of surgery were explained to include hematoma, infection, damage to nerve roots, CSF leak, weakness, numbness, pain, need for future surgery including fusion, heart attack, and stroke. He elected to proceed with surgery for symptom relief.   Procedure The patient was brought to the OR after informed consent was obtained.He was given general anesthesia and intubated by the anesthesia service. Vascular access lines were placed.The patient was then placed prone on a Wilson frameensuring all pressure points were padded. A time-out was performed per protocol.   The patient was sterilely prepped and draped.Theplannedincision was found using fluoroscopy and marked andinstilled withlocal anesthetic with epinephrine. The skin was opened sharply and the dissection taken to the fascia. This was incised and cautery was used to dissect the subperiosteal plane to expose the spinous processes and lamina of L3-4. Retractors were inserted and hemostasis was achieved. X-ray was used to confirm location.  Next, a matchstickdrill bit was used to remove the  inferior L3 lamina and superior L4 lamina to expose the ligament.The ligament was seen to be hypertrophied at this level.The underlying ligament was freed and removed with combination of rongeurs starting medially to lateral. The decompression was carried laterally where facet overgrowth and thick ligament was seen in the lateral recess. This was removed bilaterally. The dura was seen to expand as this was performed. The blunt tool was attempted to pass out the lateral recesses bilaterally and found to be free as well. The bone edges were smooth and no obvious compression remained.   Once the dura appeared free from all the bone edges and all soft tissue compression was removed, hemostasis was obtained with Floseal and cautery. The wound was irrigated profusely. The muscle was then closed using 0 vicryl followed by the fascia with 0 vicryl. Next, multiple subcutaneous and dermal layers were closed with 2-0 vicryl until the epidermis was well approximated. The skin was closed with Dermabond.   The patient was returned to supine position and extubated by the anesthesia service. The patient was then taken to the PACU for post-operative care where he was moving extremities symmetrically.   ESTIMATED BLOOD LOSS: 50cc  SPECIMENS None  IMPLANT None   I performed the case in its entiretywith the assistance of Marin Olp, [place],  Eugene Martinez, [name]

## 2019-01-26 NOTE — Anesthesia Post-op Follow-up Note (Signed)
Anesthesia QCDR form completed.        

## 2019-01-26 NOTE — Discharge Instructions (Addendum)
Your surgeon has performed an operation on your lumbar spine (low back) to relieve pressure on one or more nerves. Many times, patients feel better immediately after surgery and can overdo it. Even if you feel well, it is important that you follow these activity guidelines. If you do not let your back heal properly from the surgery, you can increase the chance of a disc herniation and/or return of your symptoms. The following are instructions to help in your recovery once you have been discharged from the hospital.  * Do not take anti-inflammatory medications for 3 days after surgery (naproxen [Aleve], ibuprofen [Advil, Motrin], celecoxib [Celebrex], etc.)  * Do not take aspirin or omega -3 supplement for 7 days after surgery  Activity    No bending, lifting, or twisting (BLT). Avoid lifting objects heavier than 10 pounds (gallon milk jug).  Where possible, avoid household activities that involve lifting, bending, pushing, or pulling such as laundry, vacuuming, grocery shopping, and childcare. Try to arrange for help from friends and family for these activities while your back heals.  Increase physical activity slowly as tolerated.  Taking short walks is encouraged, but avoid strenuous exercise. Do not jog, run, bicycle, lift weights, or participate in any other exercises unless specifically allowed by your doctor. Avoid prolonged sitting, including car rides.  Talk to your doctor before resuming sexual activity.  You should not drive until cleared by your doctor.  Until released by your doctor, you should not return to work or school.  You should rest at home and let your body heal.   You may shower two days after your surgery.  After showering, lightly dab your incision dry. Do not take a tub bath or go swimming for 3 weeks, or until approved by your doctor at your follow-up appointment.  If you smoke, we strongly recommend that you quit.  Smoking has been proven to interfere with normal  healing in your back and will dramatically reduce the success rate of your surgery. Please contact [facility] (800-QUIT-NOW) and use the resources at www.[facility].com for assistance in stopping smoking.  Surgical Incision   If you have a dressing on your incision, you may remove it three days after your surgery. Keep your incision area clean and dry.  If you have staples or stitches on your incision, you should have a follow up scheduled for removal. If you do not have staples or stitches, you will have steri-strips (small pieces of surgical tape) or Dermabond glue. The steri-strips/glue should begin to peel away within about a week (it is fine if the steri-strips fall off before then). If the strips are still in place one week after your surgery, you may gently remove them.  Diet            You may return to your usual diet. Be sure to stay hydrated.  When to Contact us  Although your surgery and recovery will likely be uneventful, you may have some residual numbness, aches, and pains in your back and/or legs. This is normal and should improve in the next few weeks.  However, should you experience any of the following, contact us immediately:  New numbness or weakness  Pain that is progressively getting worse, and is not relieved by your pain medications or rest  Bleeding, redness, swelling, pain, or drainage from surgical incision  Chills or flu-like symptoms  Fever greater than 101.0 F (38.3 C)  Problems with bowel or bladder functions  Difficulty breathing or shortness of breath  Warmth, tenderness, or swelling in your calf  Contact Information  During office hours (Monday-Friday 9 am to 5 pm), please call your physician at [phone]  After hours and weekends, please call the [place] Operator at [phone] and ask for the Neurosurgery Resident On Call   For a life-threatening emergency, call [place]   1) The drugs that you  were given will stay in your system until tomorrow so for the next 24 hours you should not:  A) Drive an automobile B) Make any legal decisions C) Drink any alcoholic beverage   2) You may resume regular meals tomorrow.  Today it is better to start with liquids and gradually work up to solid foods.  You may eat anything you prefer, but it is better to start with liquids, then soup and crackers, and gradually work up to solid foods.   3) Please notify your doctor immediately if you have any unusual bleeding, trouble breathing, redness and pain at the surgery site, drainage, fever, or pain not relieved by medication.    4) Additional Instructions:   Please contact your physician with any problems or Same Day Surgery at [phone], Monday through Friday 6 am to 4 pm, or [place] at [facility] number at [phone]. #5

## 2019-01-26 NOTE — Anesthesia Preprocedure Evaluation (Addendum)
Anesthesia Evaluation  Patient identified by MRN, date of birth, ID band Patient awake    Reviewed: Allergy & Precautions, H&P , NPO status , Patient's Chart, lab work & pertinent test results  Airway Mallampati: III  TM Distance: <3 FB     Dental  (+) Teeth Intact   Pulmonary asthma ,           Cardiovascular hypertension, +CHF (diastolic, grade 2)  + dysrhythmias (LBBB)   Echo August 2019: History:   PMH:  Amyloidosis, RBBB, LVH.  Syncope.  Congestive heart failure.  Risk factors:  Hypertension.  ------------------------------------------------------------------- Study Conclusions  - Left ventricle: The cavity size was normal. There was severe   concentric hypertrophy. Systolic function was normal. The   estimated ejection fraction was in the range of 55% to 60%. Wall   motion was normal; there were no regional wall motion   abnormalities. Features are consistent with a pseudonormal left   ventricular filling pattern, with concomitant abnormal relaxation   and increased filling pressure (grade 2 diastolic dysfunction). - Aortic valve: There was mild regurgitation. - Mitral valve: Calcified annulus. There was mild regurgitation. - Left atrium: The atrium was mildly dilated. - Right ventricle: Systolic function was normal. - Pulmonary arteries: Systolic pressure was within the normal   range.   Neuro/Psych negative neurological ROS  negative psych ROS   GI/Hepatic Neg liver ROS, GERD  Controlled,  Endo/Other  negative endocrine ROS  Renal/GU      Musculoskeletal   Abdominal   Peds  Hematology  (+) Blood dyscrasia, anemia ,   Anesthesia Other Findings Past Medical History: No date: Allergic state No date: Amyloidosis ([place]) No date: Anemia No date: BPH (benign prostatic hyperplasia) No date: DDD (degenerative disc disease), lumbar No date: Dysrhythmia No date: Environmental and seasonal allergies No  date: Gallop rhythm No date: GERD (gastroesophageal reflux disease) No date: Hypertension No date: Mild mitral regurgitation     Comment:  a. by echo 10/2012 No date: Pain     Comment:  bil. hips and thighs 1940'S: Polio No date: Polio No date: Syncope     Comment:  a. 10/2012 Echo: EF 55-60%, severe LVH with speckling of              myocardium -? amyloid. Mild MR, mod dil LA.  Past Surgical History: 1984: CIRCUMCISION 03/2012: COLONOSCOPY 10/29/2017: COLONOSCOPY WITH PROPOFOL; N/A     Comment:  Procedure: COLONOSCOPY WITH PROPOFOL;  Surgeon:               Lollie Sails, MD;  Location: [facility] ENDOSCOPY;                Service: Endoscopy;  Laterality: N/A; No date: FOOT SURGERY     Comment:  right foot 2017: FOOT SURGERY; Right No date: LEG SURGERIES     Comment:  HX.OF MULTIPLE SURGERIES FOR LEG LENGTH DUE TO POLIO No date: NECK SURGERY No date: PILONIDAL CYST / SINUS EXCISION  BMI    Body Mass Index:  27.12 kg/m      Reproductive/Obstetrics negative OB ROS                            Anesthesia Physical Anesthesia Plan  ASA: III  Anesthesia Plan: General ETT   Post-op Pain Management:    Induction:   PONV Risk Score and Plan: Ondansetron, Dexamethasone, Midazolam and Treatment may vary due to age or medical condition  Airway Management Planned: Video Laryngoscope Planned  Additional Equipment:   Intra-op Plan:   Post-operative Plan:   Informed Consent: I have reviewed the patients History and Physical, chart, labs and discussed the procedure including the risks, benefits and alternatives for the proposed anesthesia with the patient or authorized representative who has indicated his/her understanding and acceptance.     Dental Advisory Given  Plan Discussed with: Anesthesiologist and CRNA  Anesthesia Plan Comments:        Anesthesia Quick Evaluation

## 2019-01-26 NOTE — Anesthesia Procedure Notes (Signed)
Procedure Name: Intubation Date/Time: 01/26/2019 7:40 AM Performed by: Rona Ravens, CRNA Pre-anesthesia Checklist: Patient identified, Emergency Drugs available, Suction available and Patient being monitored Patient Re-evaluated:Patient Re-evaluated prior to induction Oxygen Delivery Method: Circle system utilized Preoxygenation: Pre-oxygenation with 100% oxygen Induction Type: IV induction Ventilation: Mask ventilation without difficulty Laryngoscope Size: Mac and 4 Grade View: Grade I Tube type: Oral Tube size: 7.0 mm Number of attempts: 1 Airway Equipment and Method: Stylet Placement Confirmation: ETT inserted through vocal cords under direct vision,  positive ETCO2 and breath sounds checked- equal and bilateral Secured at: 21 cm Tube secured with: Tape Dental Injury: Teeth and Oropharynx as per pre-operative assessment

## 2019-04-05 IMAGING — CR DG CHEST 2V
1 series · 2 of 2 positions shown · non-contrast
Comparison: 10/29/2012

CLINICAL DATA: Preop evaluation for upcoming spinal surgery

EXAM:
CHEST - 2 VIEW

[Series 1: dg chest 2 view · 0.14mm/px · 2 of 2 slices shown]
[im 1/2]
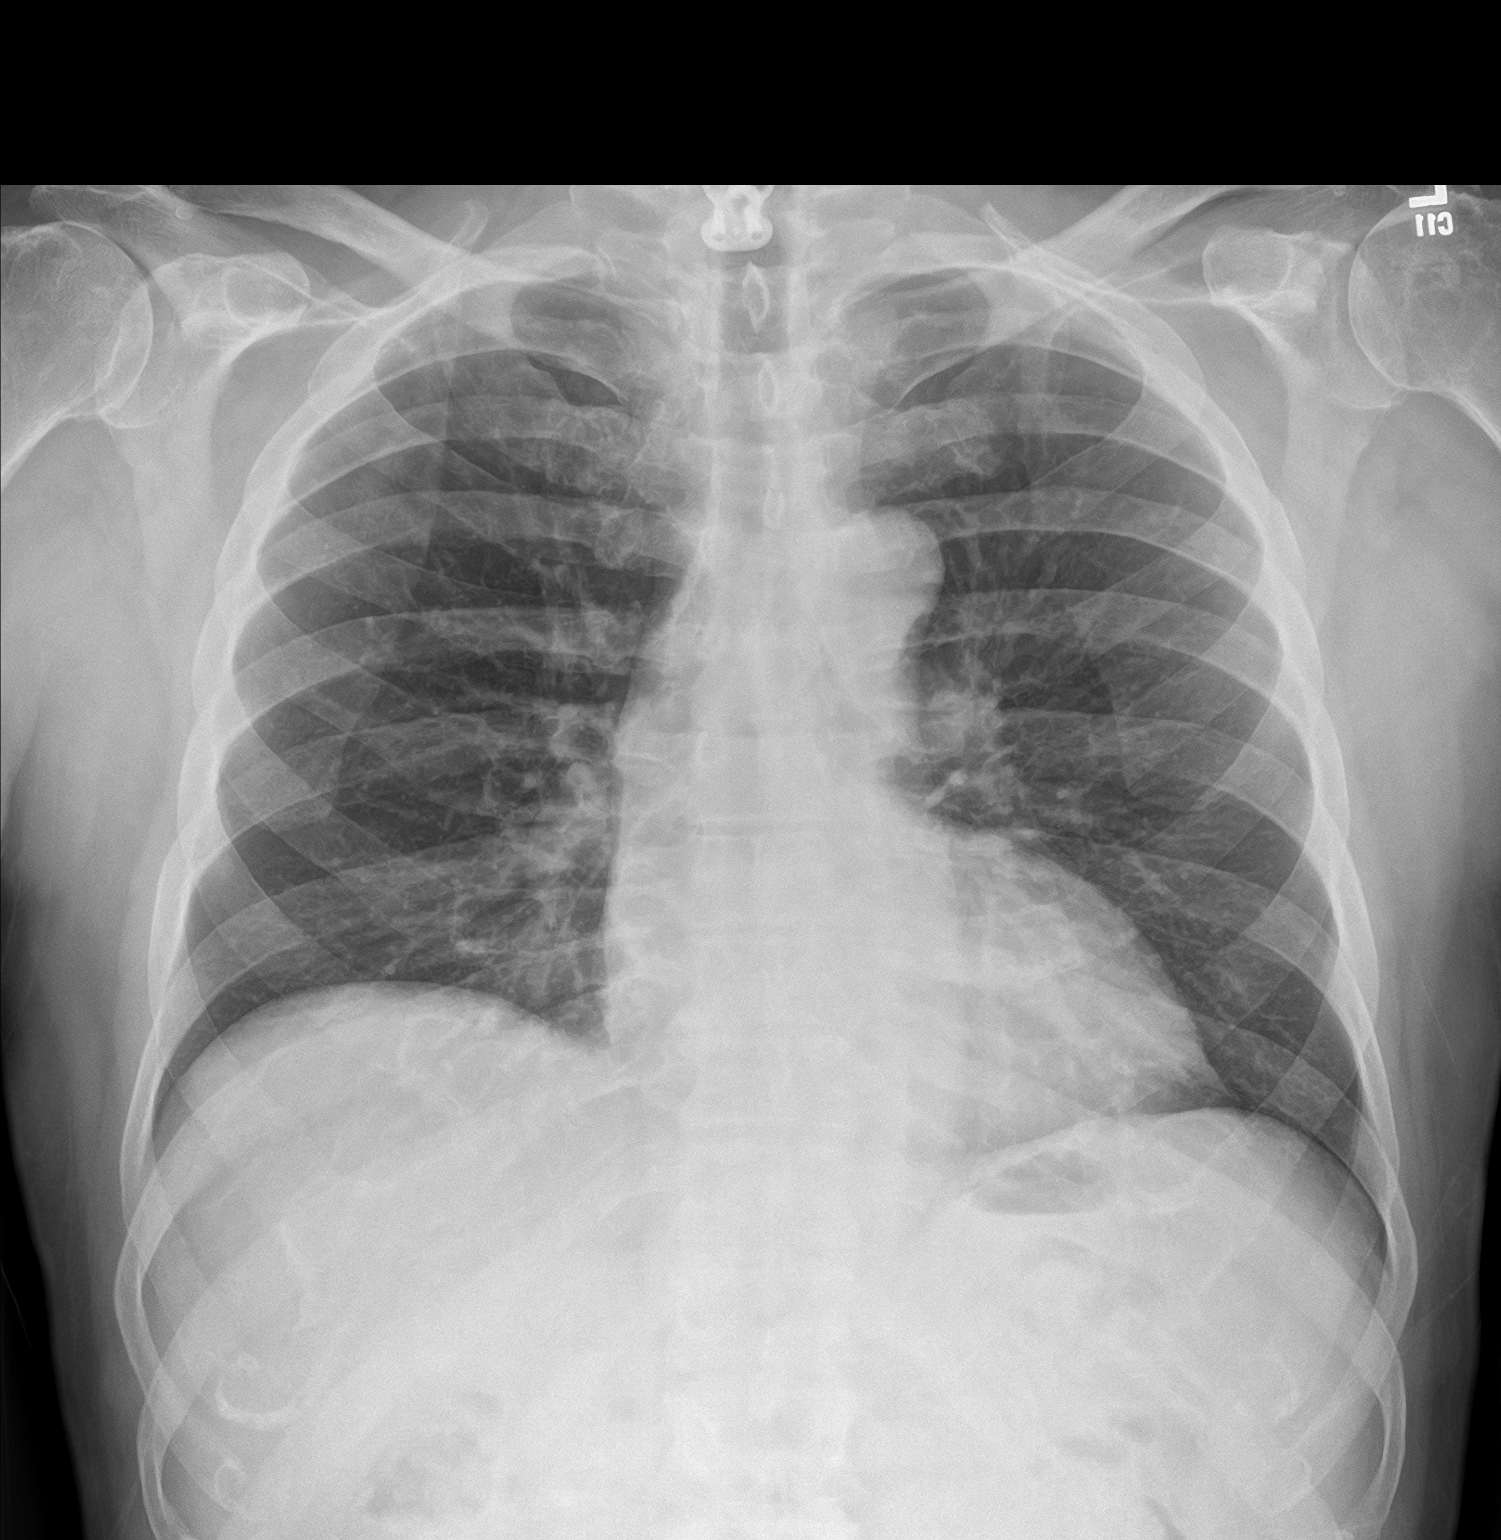
[im 2/2]
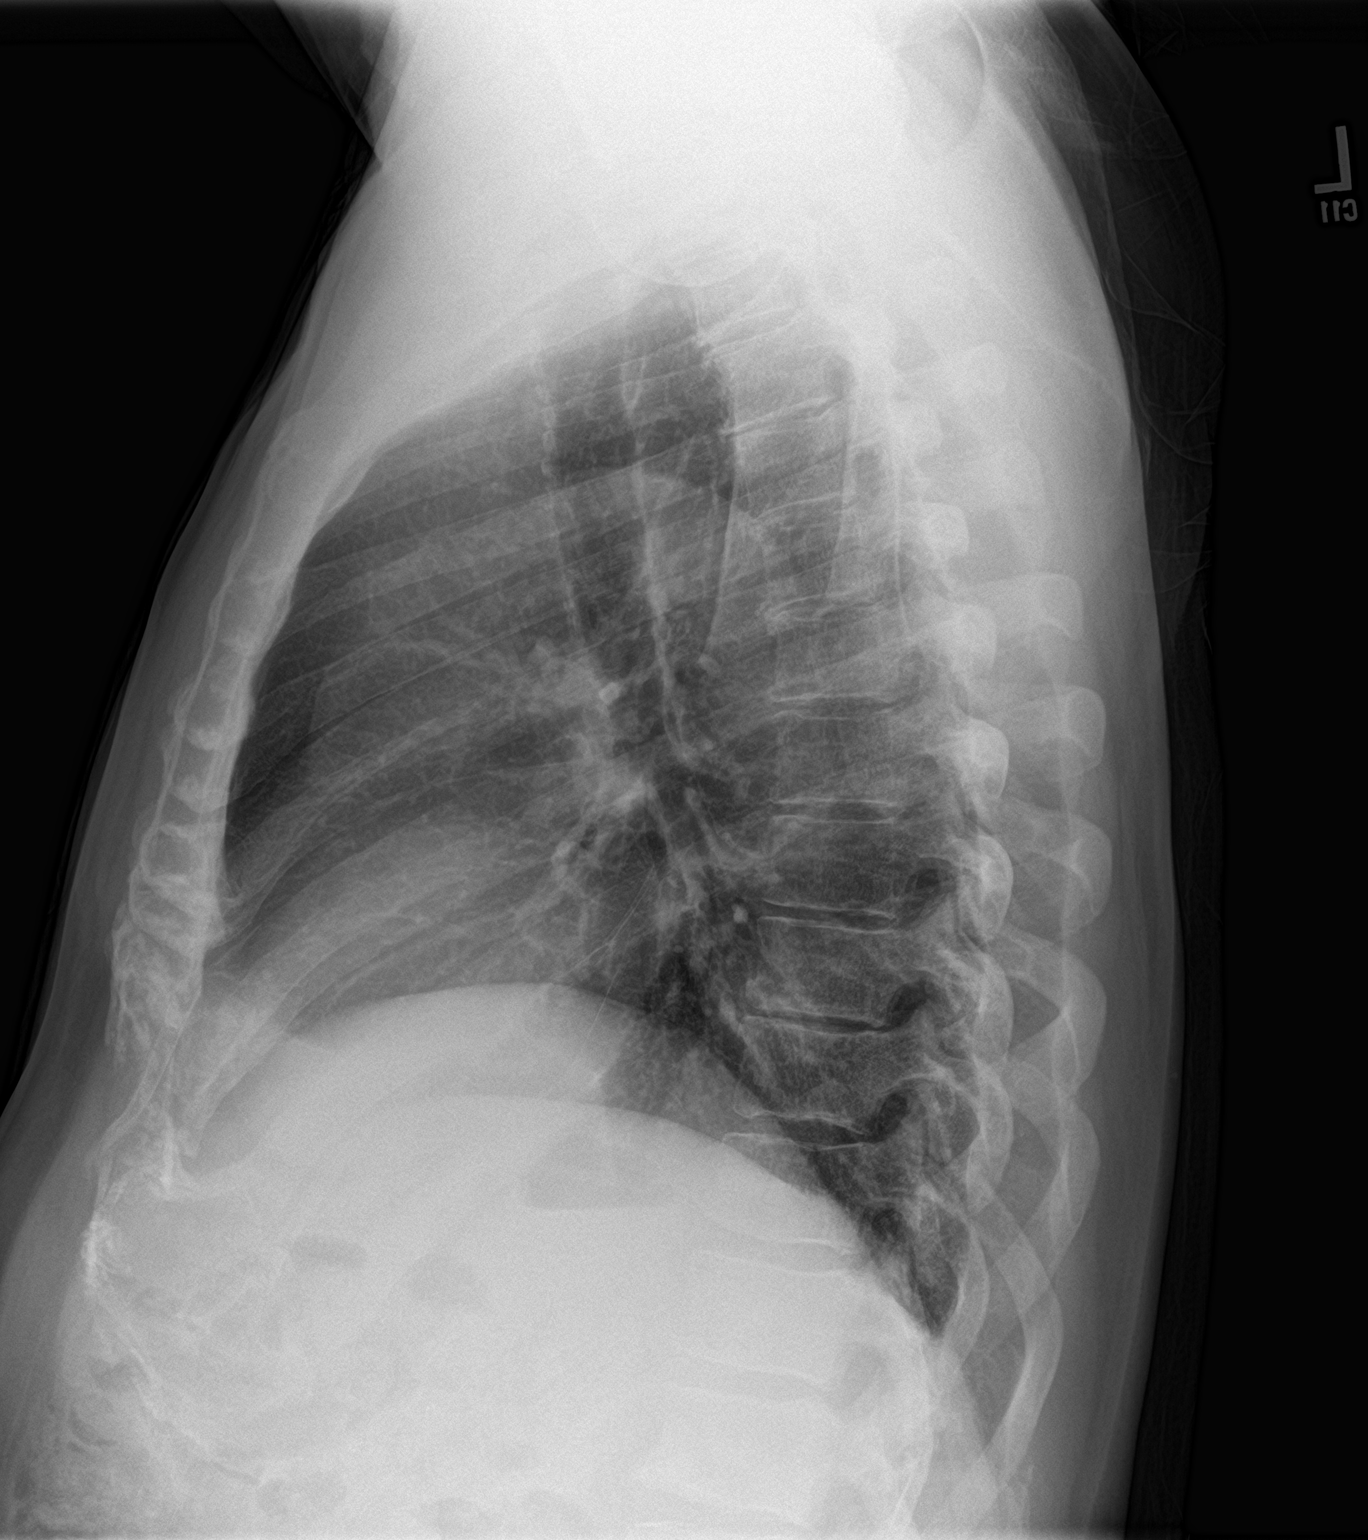

[2 of 2 positions shown; findings below may reference images not displayed]

FINDINGS: Cardiac shadows within normal limits. The lungs are well aerated
bilaterally. No focal infiltrate or sizable effusion is seen.
Postoperative changes in the cervical spine are noted.
IMPRESSION: No acute abnormality noted.

## 2019-04-19 IMAGING — RF DG C-ARM 61-120 MIN
1 series · 1 of 1 positions shown · non-contrast
Comparison: 10/08/2018 MR.

CLINICAL DATA: 72-year-old male. Lumbar surgery. Initial encounter.

EXAM:
LUMBAR SPINE - 2-3 VIEW; DG C-ARM 61-120 MIN
Fluoroscopic time: 7.7 seconds.

[Series 1: unknown protocol · 0.14mm/px · 1 of 1 slices shown]
[im 1/1]
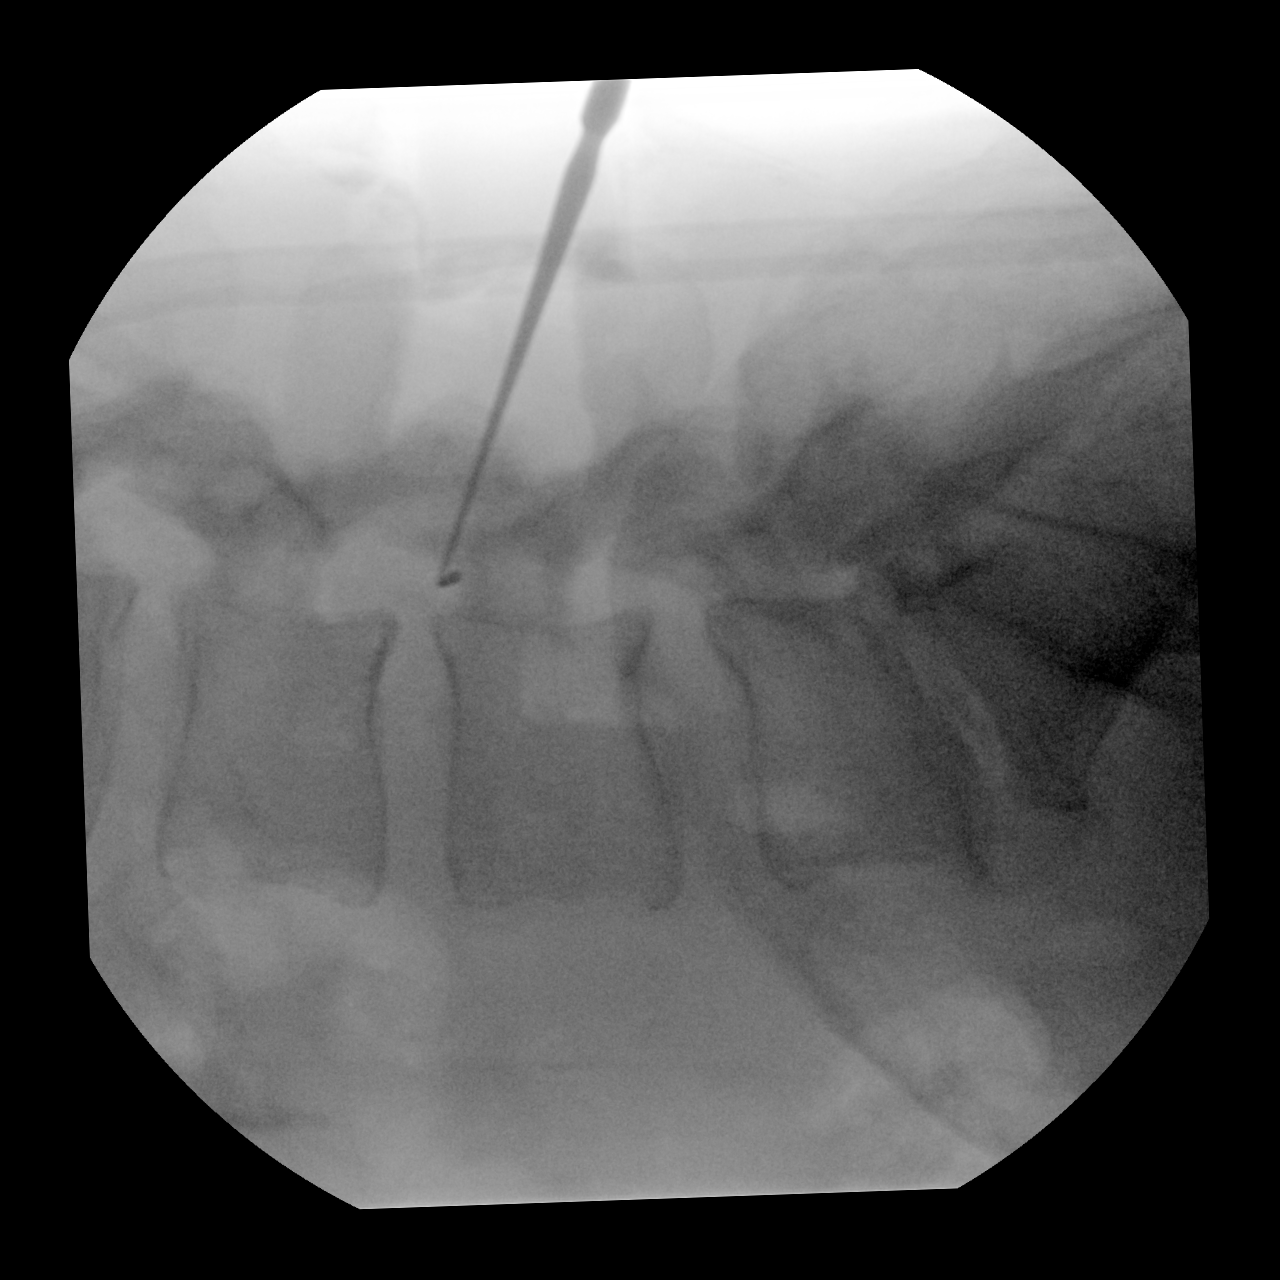

[1 of 1 positions shown; findings below may reference images not displayed]

FINDINGS: Single intraoperative lateral view of the lumbar spine submitted for
review after surgery.

Level assignment as per prior MR.

Metallic probe posterior to the L3-4 disc space level.
IMPRESSION: Localization L3-4 disc space level.

## 2019-05-25 DIAGNOSIS — Z0000 Encounter for general adult medical examination without abnormal findings: Secondary | ICD-10-CM | POA: Diagnosis not present

## 2019-05-25 DIAGNOSIS — M79604 Pain in right leg: Secondary | ICD-10-CM | POA: Diagnosis not present

## 2019-05-25 DIAGNOSIS — M79605 Pain in left leg: Secondary | ICD-10-CM | POA: Diagnosis not present

## 2019-05-25 DIAGNOSIS — I10 Essential (primary) hypertension: Secondary | ICD-10-CM | POA: Diagnosis not present

## 2019-05-25 DIAGNOSIS — G14 Postpolio syndrome: Secondary | ICD-10-CM | POA: Diagnosis not present

## 2019-05-25 DIAGNOSIS — M9983 Other biomechanical lesions of lumbar region: Secondary | ICD-10-CM | POA: Diagnosis not present

## 2019-05-25 DIAGNOSIS — E8589 Other amyloidosis: Secondary | ICD-10-CM | POA: Diagnosis not present

## 2019-05-25 DIAGNOSIS — K219 Gastro-esophageal reflux disease without esophagitis: Secondary | ICD-10-CM | POA: Diagnosis not present

## 2019-05-25 DIAGNOSIS — J45909 Unspecified asthma, uncomplicated: Secondary | ICD-10-CM | POA: Diagnosis not present

## 2019-05-25 DIAGNOSIS — E786 Lipoprotein deficiency: Secondary | ICD-10-CM | POA: Diagnosis not present

## 2019-05-25 DIAGNOSIS — M5416 Radiculopathy, lumbar region: Secondary | ICD-10-CM | POA: Diagnosis not present

## 2019-05-28 DIAGNOSIS — E8589 Other amyloidosis: Secondary | ICD-10-CM | POA: Diagnosis not present

## 2019-05-28 DIAGNOSIS — D696 Thrombocytopenia, unspecified: Secondary | ICD-10-CM | POA: Diagnosis not present

## 2019-05-28 DIAGNOSIS — Z0000 Encounter for general adult medical examination without abnormal findings: Secondary | ICD-10-CM | POA: Diagnosis not present

## 2019-05-28 DIAGNOSIS — K219 Gastro-esophageal reflux disease without esophagitis: Secondary | ICD-10-CM | POA: Diagnosis not present

## 2019-05-28 DIAGNOSIS — N400 Enlarged prostate without lower urinary tract symptoms: Secondary | ICD-10-CM | POA: Diagnosis not present

## 2019-05-28 DIAGNOSIS — J45909 Unspecified asthma, uncomplicated: Secondary | ICD-10-CM | POA: Diagnosis not present

## 2019-05-28 DIAGNOSIS — R7301 Impaired fasting glucose: Secondary | ICD-10-CM | POA: Diagnosis not present

## 2019-06-25 ENCOUNTER — Other Ambulatory Visit: Payer: Self-pay

## 2019-06-25 ENCOUNTER — Ambulatory Visit: Payer: PPO | Admitting: Podiatry

## 2019-06-25 ENCOUNTER — Encounter: Payer: Self-pay | Admitting: Podiatry

## 2019-06-25 VITALS — Temp 98.0°F

## 2019-06-25 DIAGNOSIS — M79676 Pain in unspecified toe(s): Secondary | ICD-10-CM

## 2019-06-25 DIAGNOSIS — M2580 Other specified joint disorders, unspecified joint: Secondary | ICD-10-CM | POA: Diagnosis not present

## 2019-06-25 DIAGNOSIS — B351 Tinea unguium: Secondary | ICD-10-CM

## 2019-06-25 NOTE — Progress Notes (Signed)
Complaint:  Visit Type: Patient returns to my office for continued preventative foot care services. Complaint: Patient states" my nails have grown long and thick and become painful to walk and wear shoes"  The patient presents for preventative foot care services. No changes to ROS.  He also says he has developed pain under the ball of his right foot.  Patient walks with a brace right foot.  Podiatric Exam: Vascular: dorsalis pedis and posterior tibial pulses are palpable bilateral. Capillary return is immediate. Temperature gradient is WNL. Skin turgor WNL  Sensorium: Normal Semmes Weinstein monofilament test. Normal tactile sensation bilaterally. Nail Exam: Pt has thick disfigured discolored nails with subungual debris noted bilateral entire nail hallux through fifth toenails Ulcer Exam: There is no evidence of ulcer or pre-ulcerative changes or infection. Orthopedic Exam: Muscle tone and strength are WNL. No limitations in general ROM. No crepitus or effusions noted. Foot type and digits show no abnormalities. Palpable pain tibial sesamoid right foot. Skin: No Porokeratosis. No infection or ulcers  Diagnosis:  Onychomycosis, , Pain in right toe, pain in left toes  Sesamoiditis  Right.  Treatment & Plan Procedures and Treatment: Consent by patient was obtained for treatment procedures. The patient understood the discussion of treatment and procedures well. All questions were answered thoroughly reviewed. Debridement of mycotic and hypertrophic toenails, 1 through 5 bilateral and clearing of subungual debris. No ulceration, no infection noted.  Return Visit-Office Procedure: Patient instructed to return to the office for a follow up visit prn  for continued evaluation and treatment.    Gardiner Barefoot DPM

## 2019-07-09 DIAGNOSIS — H2513 Age-related nuclear cataract, bilateral: Secondary | ICD-10-CM | POA: Diagnosis not present

## 2019-07-20 ENCOUNTER — Inpatient Hospital Stay (HOSPITAL_BASED_OUTPATIENT_CLINIC_OR_DEPARTMENT_OTHER): Payer: PPO | Admitting: Oncology

## 2019-07-20 ENCOUNTER — Inpatient Hospital Stay: Payer: PPO | Attending: Oncology

## 2019-07-20 ENCOUNTER — Telehealth: Payer: Self-pay | Admitting: Oncology

## 2019-07-20 ENCOUNTER — Other Ambulatory Visit: Payer: Self-pay

## 2019-07-20 VITALS — BP 147/81 | HR 85 | Temp 98.7°F | Resp 18 | Ht 66.0 in | Wt 172.0 lb

## 2019-07-20 DIAGNOSIS — D696 Thrombocytopenia, unspecified: Secondary | ICD-10-CM | POA: Diagnosis not present

## 2019-07-20 DIAGNOSIS — E8581 Light chain (AL) amyloidosis: Secondary | ICD-10-CM | POA: Diagnosis not present

## 2019-07-20 DIAGNOSIS — R809 Proteinuria, unspecified: Secondary | ICD-10-CM | POA: Diagnosis not present

## 2019-07-20 LAB — CBC WITH DIFFERENTIAL (CANCER CENTER ONLY)
Abs Immature Granulocytes: 0.03 10*3/uL (ref 0.00–0.07)
Basophils Absolute: 0 10*3/uL (ref 0.0–0.1)
Basophils Relative: 1 %
Eosinophils Absolute: 0.1 10*3/uL (ref 0.0–0.5)
Eosinophils Relative: 2 %
HCT: 40.9 % (ref 39.0–52.0)
Hemoglobin: 14.1 g/dL (ref 13.0–17.0)
Immature Granulocytes: 1 %
Lymphocytes Relative: 23 %
Lymphs Abs: 1.4 10*3/uL (ref 0.7–4.0)
MCH: 31.6 pg (ref 26.0–34.0)
MCHC: 34.5 g/dL (ref 30.0–36.0)
MCV: 91.7 fL (ref 80.0–100.0)
Monocytes Absolute: 0.8 10*3/uL (ref 0.1–1.0)
Monocytes Relative: 14 %
Neutro Abs: 3.7 10*3/uL (ref 1.7–7.7)
Neutrophils Relative %: 59 %
Platelet Count: 131 10*3/uL — ABNORMAL LOW (ref 150–400)
RBC: 4.46 MIL/uL (ref 4.22–5.81)
RDW: 12.8 % (ref 11.5–15.5)
WBC Count: 6.1 10*3/uL (ref 4.0–10.5)
nRBC: 0 % (ref 0.0–0.2)

## 2019-07-20 LAB — CMP (CANCER CENTER ONLY)
ALT: 37 U/L (ref 0–44)
AST: 34 U/L (ref 15–41)
Albumin: 4.5 g/dL (ref 3.5–5.0)
Alkaline Phosphatase: 53 U/L (ref 38–126)
Anion gap: 9 (ref 5–15)
BUN: 21 mg/dL (ref 8–23)
CO2: 25 mmol/L (ref 22–32)
Calcium: 9.5 mg/dL (ref 8.9–10.3)
Chloride: 105 mmol/L (ref 98–111)
Creatinine: 0.92 mg/dL (ref 0.61–1.24)
GFR, Est AFR Am: >60 mL/min (ref >60)
GFR, Estimated: >60 mL/min (ref >60)
Glucose, Bld: 104 mg/dL — ABNORMAL HIGH (ref 70–99)
Potassium: 4.8 mmol/L (ref 3.5–5.1)
Sodium: 139 mmol/L (ref 135–145)
Total Bilirubin: 0.8 mg/dL (ref 0.3–1.2)
Total Protein: 7.1 g/dL (ref 6.5–8.1)

## 2019-07-20 NOTE — Telephone Encounter (Signed)
Scheduled appt per 8/10 los - gave patient AVS and calender per los.   

## 2019-07-20 NOTE — Progress Notes (Signed)
  [place] OFFICE PROGRESS NOTE   Diagnosis: Amyloidosis  INTERVAL HISTORY:   Eugene Martinez returns as scheduled.  He feels well.  No complaints.  Objective:  Vital signs in last 24 hours:  Blood pressure (!) 147/81, pulse 85, temperature 98.7 F (37.1 C), temperature source Oral, resp. rate 18, height 5\' 6"  (1.676 m), weight 172 lb (78 kg), SpO2 100 %.     Resp: Lungs clear bilaterally Cardio: Regular rate and rhythm, S3 versus split S2 GI: No hepatosplenomegaly Vascular: No leg edema   Lab Results:  Lab Results  Component Value Date   WBC 6.1 07/20/2019   HGB 14.1 07/20/2019   HCT 40.9 07/20/2019   MCV 91.7 07/20/2019   PLT 131 (L) 07/20/2019   NEUTROABS 3.7 07/20/2019    CMP  Lab Results  Component Value Date   NA 139 01/19/2019   K 4.4 01/19/2019   CL 106 01/19/2019   CO2 25 01/19/2019   GLUCOSE 118 (H) 01/19/2019   BUN 18 01/19/2019   CREATININE 0.82 01/19/2019   CALCIUM 9.3 01/19/2019   PROT 6.8 01/19/2019   ALBUMIN 4.1 01/19/2019   AST 37 01/19/2019   ALT 36 01/19/2019   ALKPHOS 53 01/19/2019   BILITOT 0.8 01/19/2019   GFRNONAA >60 01/19/2019   GFRAA >60 01/19/2019    01/19/2019: Serum M spike not observed, kappa free light chain 17.8, lambda free light chain 70.6, 24-hour urine total protein 72 mg Medications: I have reviewed the patient's current medications.   Assessment/Plan: 1. AL Amyloidosis, lambda light chain, diagnosed in December 2013-most recently treated with every 2 week Velcade/Decadron, last given 03/19/2014   Status post autologous stem cell collection at [facility] 04/06/2014  Urine protein improved and serum light chains stable 05/08/2016  Urine protein stable, light chains stable 07/22/2018  Urine protein stable, light chains stable 01/19/2019 2. 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 Followed bycardiology, echocardiogram September 2015 with an LVEF of 65-70%  and severe LVH 4. 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 Zoster rash December 2016     Disposition: Eugene Martinez appears unchanged.  There is no clinical or laboratory evidence of disease progression.  We will follow-up on the light chains and 24-hour urine protein from today.  He will return for office and lab visit in 6 months.  15 minutes were spent with the patient today.  The majority of the time was used for counseling and coordination of care.  Betsy Coder, MD  07/20/2019  1:00 PM

## 2019-07-21 LAB — PROTEIN ELECTROPHORESIS, SERUM
A/G Ratio: 1.5 (ref 0.7–1.7)
Albumin ELP: 4 g/dL (ref 2.9–4.4)
Alpha-1-Globulin: 0.1 g/dL (ref 0.0–0.4)
Alpha-2-Globulin: 0.8 g/dL (ref 0.4–1.0)
Beta Globulin: 0.8 g/dL (ref 0.7–1.3)
Gamma Globulin: 0.9 g/dL (ref 0.4–1.8)
Globulin, Total: 2.6 g/dL (ref 2.2–3.9)
Total Protein ELP: 6.6 g/dL (ref 6.0–8.5)

## 2019-07-21 LAB — KAPPA/LAMBDA LIGHT CHAINS
Kappa free light chain: 17.2 mg/L (ref 3.3–19.4)
Kappa, lambda light chain ratio: 0.93 (ref 0.26–1.65)
Lambda free light chains: 18.5 mg/L (ref 5.7–26.3)

## 2019-07-24 LAB — UPEP/UIFE/LIGHT CHAINS/TP, 24-HR UR
% BETA, Urine: 0 %
ALPHA 1 URINE: 0 %
Albumin, U: 100 %
Alpha 2, Urine: 0 %
Free Kappa Lt Chains,Ur: 3.2 mg/L (ref 0.63–113.79)
Free Kappa/Lambda Ratio: >4.92 (ref 1.03–31.76)
Free Lambda Lt Chains,Ur: <0.65 mg/L (ref 0.47–11.77)
GAMMA GLOBULIN URINE: 0 %
Total Protein, Urine-Ur/day: <78 mg/24 hr (ref 30–150)
Total Protein, Urine: <4 mg/dL
Total Volume: 1950

## 2019-10-03 NOTE — Progress Notes (Signed)
Cardiology Office Note  Date:  10/05/2019   ID:  Eugene Valentine., DOB 02-Aug-1946, MRN [id number]  PCP:  Tracie Harrier, MD   Chief Complaint  Patient presents with  . Other    12 month follow up. Patient denies chest pain and SOB at this time.  Meds reviewed verbally with patient.     HPI:  73 year old gentleman With history of  Polio, uses a brace right lower extremity AL Amyloidosis, diagnosis 2013, previous chemo,  remission since 11/2013, renal biopsy established the diagnosis of AL amyloidosis. Followed by oncology Syncope  severe LVH on echo and MRI,  exertional syncope who presents for routine follow-up of his severe LVH, amyloid disease  Recently seen by oncology for AL amyloidosis Felt to be stable August 2020  [facility] yesterday, bruise left hand Legs gave out Feels the repetitive activity of moving heavy boxes cause leg fatigue, legs seem to give out from under him Denied near syncope or syncope  Thinking about retiring, working as an Optometrist No regular exercise program  Lab work reviewed with him Total chol 134, LDL 54  Back surgery 01/2019 at [facility] No significant complications  EKG personally reviewed by myself on todays visit Shows normal sinus rhythm right bundle branch block, left anterior fascicular block with LVH No change from prior EKG  Other past medical history reviewed Echocardiogram August 2019, no significant change from echocardiogram September 2015 Left ventricle: The cavity size was normal. There was severe concentric hypertrophy. Systolic function was normal. The estimated ejection fraction was in the range of 55% to 60%. Wall motion was normal; there were no regional wall motion abnormalities. Features are consistent with a pseudonormal left ventricular filling pattern, with concomitant abnormal relaxation and increased filling pressure (grade 2 diastolic dysfunction). - Pulmonary arteries: Systolic pressure was within the normal  range.   Lab work reviewed in detail with him on today's visit Total chol 137, LDL 74  EKG personally reviewed by myself on todays visit shows normal sinus rhythm with rate 93 pm, right bundle branch block, LVH, no change from previous EKG    other past medical history September 2015 hospitalized at [facility] hospital after experiencing exertional syncope while walking up steps    echocardiogram with  significant left ventricular hypertrophy and a speckling of the myocardium   Systolic function was normal with ejection fraction of 55-60% and the left atrium was moderately dilated.  cardiac MRI 2013 Severe LVH, Left ventricular wall motion was normal with an ejection fraction of 66%. mild right ventricular hypertrophy and normal right atrial size and mild to moderate mitral regurgitation.  urine immunoelectrophoresis   light chain Bence-Jones protein in the urine.  fat pad biopsy which was not diagnostic for myeloma or amyloidosis and a bone marrow biopsy which was not diagnostic.  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  completed chemotherapy for his amyloidosis and has been in remission since December 2014. He has had previous harvest of stem cells down at [facility] but has not had a stem cell transplant.  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  PSH:    Past Surgical History:  Procedure Laterality Date  . CIRCUMCISION  1984  . COLONOSCOPY  03/2012  . COLONOSCOPY WITH PROPOFOL N/A 10/29/2017   Procedure: COLONOSCOPY WITH PROPOFOL;  Surgeon: Lollie Sails, MD;  Location: [facility] ENDOSCOPY;  Service: Endoscopy;  Laterality: N/A;  . FOOT SURGERY     right foot  . FOOT SURGERY Right 2017  . LEG SURGERIES     HX.OF MULTIPLE SURGERIES FOR LEG LENGTH DUE TO POLIO  . LUMBAR LAMINECTOMY/DECOMPRESSION MICRODISCECTOMY N/A 01/26/2019   Procedure: LUMBAR LAMINECTOMY/DECOMPRESSION MICRODISCECTOMY 1 LEVEL L3-4;  Surgeon: Deetta Perla, MD;  Location: [facility] ORS;  Service: Neurosurgery;  Laterality: N/A;  . NECK SURGERY    . PILONIDAL CYST / SINUS EXCISION      Current Outpatient Medications  Medication Sig Dispense Refill  . acetaminophen (TYLENOL) 500 MG tablet Take by mouth.    . Apoaequorin (PREVAGEN PO) Take 1 capsule by mouth daily.    Marland Kitchen aspirin 81 MG chewable tablet Chew 81 mg by mouth daily.    Marland Kitchen azelastine (ASTELIN) 137 MCG/SPRAY nasal spray Place 1-2 sprays into both nostrils every 12 (twelve) hours as needed (allergies).     . cholecalciferol (VITAMIN D) 1000 UNITS tablet Take 1,000 Units by mouth at bedtime.     . finasteride (PROSCAR) 5 MG tablet TAKE 1 TABLET BY MOUTH EVERY DAY    . iron polysaccharides (FERREX 150) 150 MG capsule daily.    Marland Kitchen levocetirizine (XYZAL) 5 MG tablet Take 5 mg by mouth at bedtime. To relieve allergy symptoms    . Multiple Vitamins-Minerals (CENTRUM SILVER ULTRA MENS) TABS Take 1 tablet by mouth every morning.     . mupirocin ointment (BACTROBAN) 2 % Apply topically.    . Omega-3 Fatty Acids (FISH OIL) 1200 MG CAPS Take 1,200 mg by mouth 2 (two) times daily.     Marland Kitchen oxyCODONE (ROXICODONE) 5 MG immediate release tablet Take 1 tablet (5 mg total) by mouth every 4 (four) hours as needed for breakthrough pain. 30 tablet 0  . triamcinolone (NASACORT ALLERGY 24HR) 55 MCG/ACT  AERO nasal inhaler Place 2 sprays daily into the nose.    . triamcinolone cream (KENALOG) 0.1 % Apply 1 application topically daily as needed (skin rash).   2  . vitamin E 400 UNIT capsule Take 400 Units by mouth every morning.      No current facility-administered medications for this visit.      Allergies:   Other   Social History:  The patient  reports that he has never smoked. He has never used smokeless tobacco. He reports that he does not drink alcohol or use drugs.   Family History:   family history includes Heart attack in his father.    Review of Systems: Review of Systems  Constitutional: Negative.   Respiratory: Negative.   Cardiovascular: Negative.   Gastrointestinal: Negative.   Musculoskeletal: Positive for joint pain.       Bruising left hand  Neurological: Negative.   Psychiatric/Behavioral: Negative.   All other systems reviewed and are negative.   PHYSICAL EXAM: VS:  BP 140/78 (BP Location: Left Arm, Patient Position: Sitting, Cuff Size: Normal)   Pulse 86   Ht '5\' 6"'  (1.676 m)   Wt 170 lb 4 oz (77.2 kg)   BMI 27.48 kg/m  , BMI Body mass index is 27.48 kg/m.  Constitutional:  oriented to person, place, and time. No distress.  HENT:  Head: Grossly normal Eyes:  no discharge. No scleral icterus.  Neck: No JVD, no carotid bruits  Cardiovascular: Regular rate and rhythm, no murmurs appreciated Pulmonary/Chest: Clear to auscultation bilaterally, no wheezes or rails Abdominal: Soft.  no distension.  no tenderness.  Musculoskeletal: Normal range of motion Neurological:  normal muscle tone. Coordination normal. No atrophy Skin: Skin warm and dry Psychiatric: normal affect, pleasant  Recent Labs: 07/20/2019: ALT 37; BUN 21; Creatinine 0.92; Hemoglobin 14.1; Platelet Count 131; Potassium 4.8; Sodium 139    Lipid Panel Lab Results  Component Value Date   CHOL 209 (H) 10/31/2012   HDL 37 (L) 10/31/2012   LDLCALC 148 (H) 10/31/2012   TRIG 118 10/31/2012     Wt Readings from Last 3 Encounters:  10/05/19 170 lb 4 oz (77.2 kg)  07/20/19 172 lb (78 kg)  01/26/19 168 lb (76.2 kg)     ASSESSMENT AND PLAN:  Essential hypertension - Plan: EKG 12-Lead Blood pressure is mildly elevated, well controlled at home . no changes made to the medications.  Syncope, unspecified syncope type Denies any near-syncope or syncope   severe LVH anatomy  on echocardiogram Reports recent episode of falling, feels his legs gave out from under him but was not near syncope or syncope  AL amyloidosis ([place]) Stable symptoms Previous chemotherapy, stable since 2014 Followed by oncology No further cardiac work-up needed at this time  LVH (left ventricular hypertrophy)  severe LVH on echocardiogram and MRI No change in the past 6 years He denies any significant symptoms  Hyperlipidemia Well-controlled numbers, not on a statin   Total encounter time more than 25 minutes  Greater than 50% was spent in counseling and coordination of care with the patient   Disposition:   F/U  12 months   Orders Placed This Encounter  Procedures  . EKG 12-Lead     Signed, Esmond Plants, M.D., Ph.D. 10/05/2019  [facility], [place]

## 2019-10-05 ENCOUNTER — Encounter: Payer: Self-pay | Admitting: Cardiovascular Disease

## 2019-10-05 ENCOUNTER — Other Ambulatory Visit: Payer: Self-pay

## 2019-10-05 ENCOUNTER — Ambulatory Visit (INDEPENDENT_AMBULATORY_CARE_PROVIDER_SITE_OTHER): Payer: PPO | Admitting: Cardiovascular Disease

## 2019-10-05 VITALS — BP 140/78 | HR 86 | Ht 66.0 in | Wt 170.2 lb

## 2019-10-05 DIAGNOSIS — I10 Essential (primary) hypertension: Secondary | ICD-10-CM | POA: Diagnosis not present

## 2019-10-05 DIAGNOSIS — I517 Cardiomegaly: Secondary | ICD-10-CM

## 2019-10-05 DIAGNOSIS — I5032 Chronic diastolic (congestive) heart failure: Secondary | ICD-10-CM | POA: Diagnosis not present

## 2019-10-05 DIAGNOSIS — R55 Syncope and collapse: Secondary | ICD-10-CM

## 2019-10-05 DIAGNOSIS — E8581 Light chain (AL) amyloidosis: Secondary | ICD-10-CM

## 2019-10-05 NOTE — Patient Instructions (Addendum)

## 2019-11-18 DIAGNOSIS — Z0000 Encounter for general adult medical examination without abnormal findings: Secondary | ICD-10-CM | POA: Diagnosis not present

## 2019-11-18 DIAGNOSIS — E8589 Other amyloidosis: Secondary | ICD-10-CM | POA: Diagnosis not present

## 2019-11-18 DIAGNOSIS — J45909 Unspecified asthma, uncomplicated: Secondary | ICD-10-CM | POA: Diagnosis not present

## 2019-11-18 DIAGNOSIS — Z125 Encounter for screening for malignant neoplasm of prostate: Secondary | ICD-10-CM | POA: Diagnosis not present

## 2019-11-18 DIAGNOSIS — D696 Thrombocytopenia, unspecified: Secondary | ICD-10-CM | POA: Diagnosis not present

## 2019-11-18 DIAGNOSIS — Z13220 Encounter for screening for lipoid disorders: Secondary | ICD-10-CM | POA: Diagnosis not present

## 2019-11-18 DIAGNOSIS — N400 Enlarged prostate without lower urinary tract symptoms: Secondary | ICD-10-CM | POA: Diagnosis not present

## 2019-11-18 DIAGNOSIS — E786 Lipoprotein deficiency: Secondary | ICD-10-CM | POA: Diagnosis not present

## 2019-11-18 DIAGNOSIS — R7301 Impaired fasting glucose: Secondary | ICD-10-CM | POA: Diagnosis not present

## 2019-11-18 DIAGNOSIS — I10 Essential (primary) hypertension: Secondary | ICD-10-CM | POA: Diagnosis not present

## 2019-11-18 DIAGNOSIS — K219 Gastro-esophageal reflux disease without esophagitis: Secondary | ICD-10-CM | POA: Diagnosis not present

## 2019-11-18 DIAGNOSIS — Z1329 Encounter for screening for other suspected endocrine disorder: Secondary | ICD-10-CM | POA: Diagnosis not present

## 2019-11-23 DIAGNOSIS — D692 Other nonthrombocytopenic purpura: Secondary | ICD-10-CM | POA: Diagnosis not present

## 2019-11-23 DIAGNOSIS — L905 Scar conditions and fibrosis of skin: Secondary | ICD-10-CM | POA: Diagnosis not present

## 2019-11-23 DIAGNOSIS — D1801 Hemangioma of skin and subcutaneous tissue: Secondary | ICD-10-CM | POA: Diagnosis not present

## 2019-11-23 DIAGNOSIS — Q825 Congenital non-neoplastic nevus: Secondary | ICD-10-CM | POA: Diagnosis not present

## 2019-11-23 DIAGNOSIS — L820 Inflamed seborrheic keratosis: Secondary | ICD-10-CM | POA: Diagnosis not present

## 2019-11-23 DIAGNOSIS — L918 Other hypertrophic disorders of the skin: Secondary | ICD-10-CM | POA: Diagnosis not present

## 2019-11-23 DIAGNOSIS — L821 Other seborrheic keratosis: Secondary | ICD-10-CM | POA: Diagnosis not present

## 2019-11-23 DIAGNOSIS — D225 Melanocytic nevi of trunk: Secondary | ICD-10-CM | POA: Diagnosis not present

## 2019-11-23 DIAGNOSIS — D2230 Melanocytic nevi of unspecified part of face: Secondary | ICD-10-CM | POA: Diagnosis not present

## 2019-11-25 DIAGNOSIS — D696 Thrombocytopenia, unspecified: Secondary | ICD-10-CM | POA: Diagnosis not present

## 2019-11-25 DIAGNOSIS — R739 Hyperglycemia, unspecified: Secondary | ICD-10-CM | POA: Diagnosis not present

## 2019-11-25 DIAGNOSIS — J45909 Unspecified asthma, uncomplicated: Secondary | ICD-10-CM | POA: Diagnosis not present

## 2019-11-25 DIAGNOSIS — J302 Other seasonal allergic rhinitis: Secondary | ICD-10-CM | POA: Diagnosis not present

## 2019-11-25 DIAGNOSIS — E8589 Other amyloidosis: Secondary | ICD-10-CM | POA: Diagnosis not present

## 2019-11-25 DIAGNOSIS — G14 Postpolio syndrome: Secondary | ICD-10-CM | POA: Diagnosis not present

## 2019-11-25 DIAGNOSIS — K219 Gastro-esophageal reflux disease without esophagitis: Secondary | ICD-10-CM | POA: Diagnosis not present

## 2020-01-21 ENCOUNTER — Other Ambulatory Visit: Payer: Self-pay

## 2020-01-21 ENCOUNTER — Inpatient Hospital Stay: Payer: PPO

## 2020-01-21 ENCOUNTER — Inpatient Hospital Stay: Payer: PPO | Attending: Oncology | Admitting: Oncology

## 2020-01-21 VITALS — BP 147/88 | HR 89 | Temp 97.8°F | Resp 20 | Ht 66.0 in | Wt 169.7 lb

## 2020-01-21 DIAGNOSIS — E8581 Light chain (AL) amyloidosis: Secondary | ICD-10-CM

## 2020-01-21 DIAGNOSIS — D696 Thrombocytopenia, unspecified: Secondary | ICD-10-CM | POA: Diagnosis not present

## 2020-01-21 LAB — CMP (CANCER CENTER ONLY)
ALT: 35 U/L (ref 0–44)
AST: 30 U/L (ref 15–41)
Albumin: 4.5 g/dL (ref 3.5–5.0)
Alkaline Phosphatase: 58 U/L (ref 38–126)
Anion gap: 7 (ref 5–15)
BUN: 21 mg/dL (ref 8–23)
CO2: 27 mmol/L (ref 22–32)
Calcium: 9.4 mg/dL (ref 8.9–10.3)
Chloride: 105 mmol/L (ref 98–111)
Creatinine: 0.83 mg/dL (ref 0.61–1.24)
GFR, Est AFR Am: >60 mL/min (ref >60)
GFR, Estimated: >60 mL/min (ref >60)
Glucose, Bld: 119 mg/dL — ABNORMAL HIGH (ref 70–99)
Potassium: 4.8 mmol/L (ref 3.5–5.1)
Sodium: 139 mmol/L (ref 135–145)
Total Bilirubin: 0.7 mg/dL (ref 0.3–1.2)
Total Protein: 7.5 g/dL (ref 6.5–8.1)

## 2020-01-21 LAB — CBC WITH DIFFERENTIAL (CANCER CENTER ONLY)
Abs Immature Granulocytes: 0.04 10*3/uL (ref 0.00–0.07)
Basophils Absolute: 0 10*3/uL (ref 0.0–0.1)
Basophils Relative: 1 %
Eosinophils Absolute: 0.2 10*3/uL (ref 0.0–0.5)
Eosinophils Relative: 3 %
HCT: 41.4 % (ref 39.0–52.0)
Hemoglobin: 14.4 g/dL (ref 13.0–17.0)
Immature Granulocytes: 1 %
Lymphocytes Relative: 25 %
Lymphs Abs: 1.5 10*3/uL (ref 0.7–4.0)
MCH: 31.9 pg (ref 26.0–34.0)
MCHC: 34.8 g/dL (ref 30.0–36.0)
MCV: 91.6 fL (ref 80.0–100.0)
Monocytes Absolute: 0.9 10*3/uL (ref 0.1–1.0)
Monocytes Relative: 14 %
Neutro Abs: 3.6 10*3/uL (ref 1.7–7.7)
Neutrophils Relative %: 56 %
Platelet Count: 143 10*3/uL — ABNORMAL LOW (ref 150–400)
RBC: 4.52 MIL/uL (ref 4.22–5.81)
RDW: 12.9 % (ref 11.5–15.5)
WBC Count: 6.3 10*3/uL (ref 4.0–10.5)
nRBC: 0 % (ref 0.0–0.2)

## 2020-01-21 NOTE — Progress Notes (Signed)
  [place] OFFICE PROGRESS NOTE   Diagnosis: Amyloidosis  INTERVAL HISTORY:   Eugene Martinez returns as scheduled.  He feels well.  No swelling.  No new complaint.  No recent infection.  He has received both of the Covid vaccines.  He had an echocardiogram and [place] of 2020.  Objective:  Vital signs in last 24 hours:  Blood pressure (!) 147/88, pulse 89, temperature 97.8 F (36.6 C), temperature source Temporal, resp. rate 20, height 5\' 6"  (1.676 m), weight 169 lb 11.2 oz (77 kg), SpO2 98 %.    HEENT: No macroglossia Cardio: Regular rate and rhythm, S3 versus split S2 GI: No hepatosplenomegaly Vascular: No leg edema  Lab Results:  Lab Results  Component Value Date   WBC 6.3 01/21/2020   HGB 14.4 01/21/2020   HCT 41.4 01/21/2020   MCV 91.6 01/21/2020   PLT 143 (L) 01/21/2020   NEUTROABS 3.6 01/21/2020    CMP  Lab Results  Component Value Date   NA 139 07/20/2019   K 4.8 07/20/2019   CL 105 07/20/2019   CO2 25 07/20/2019   GLUCOSE 104 (H) 07/20/2019   BUN 21 07/20/2019   CREATININE 0.92 07/20/2019   CALCIUM 9.5 07/20/2019   PROT 7.1 07/20/2019   ALBUMIN 4.5 07/20/2019   AST 34 07/20/2019   ALT 37 07/20/2019   ALKPHOS 53 07/20/2019   BILITOT 0.8 07/20/2019   GFRNONAA >60 07/20/2019   GFRAA >60 07/20/2019   07/20/2019: Lambda light chains 17.2, serum M spike not observed, 24-hour urine protein 78 mg  Medications: I have reviewed the patient's current medications.   Assessment/Plan: 1. AL Amyloidosis, lambda light chain, diagnosed in December 2013-most recently treated with every 2 week Velcade/Decadron, last given 03/19/2014   Status post autologous stem cell collection at [facility] 04/06/2014  Urine protein improved and serum light chains stable 05/08/2016  Urine protein stable, light chains stable 07/22/2018  Urine protein stable, light chains stable 01/19/2019  Urine protein stable, light chains stable 07/20/2019 2. History of nephrotic  range proteinuria secondary to #1  3. 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 Followed bycardiology, echocardiogram September 2015 with an LVEF of 65-70% and severe LVH 4. 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 Zoster rash December 2016  Disposition: Eugene Martinez appears unchanged.  There is no clinical or laboratory evidence for progression of the amyloidosis.  We will follow-up on the 24-hour urine from today.  He will return for an office and lab visit 8 months.  We will follow-up on the echocardiogram from October 2020.  Betsy Coder, MD  01/21/2020  12:16 PM

## 2020-01-22 LAB — KAPPA/LAMBDA LIGHT CHAINS
Kappa free light chain: 18.8 mg/L (ref 3.3–19.4)
Kappa, lambda light chain ratio: 0.72 (ref 0.26–1.65)
Lambda free light chains: 26 mg/L (ref 5.7–26.3)

## 2020-01-22 LAB — PROTEIN ELECTROPHORESIS, SERUM
A/G Ratio: 1.6 (ref 0.7–1.7)
Albumin ELP: 4.3 g/dL (ref 2.9–4.4)
Alpha-1-Globulin: 0.1 g/dL (ref 0.0–0.4)
Alpha-2-Globulin: 0.7 g/dL (ref 0.4–1.0)
Beta Globulin: 0.9 g/dL (ref 0.7–1.3)
Gamma Globulin: 0.9 g/dL (ref 0.4–1.8)
Globulin, Total: 2.7 g/dL (ref 2.2–3.9)
Total Protein ELP: 7 g/dL (ref 6.0–8.5)

## 2020-01-22 LAB — UIFE/LIGHT CHAINS/TP QN, 24-HR UR
FR KAPPA LT CH,24HR: 7.98 mg/24 hr
FR LAMBDA LT CH,24HR: <1.4 mg/24 hr
Free Kappa Lt Chains,Ur: 3.99 mg/L (ref 0.63–113.79)
Free Kappa/Lambda Ratio: >5.7 (ref 1.03–31.76)
Free Lambda Lt Chains,Ur: <0.7 mg/L (ref 0.47–11.77)
Total Protein, Urine-Ur/day: <80 mg/24 hr (ref 30–150)
Total Protein, Urine: <4 mg/dL
Total Volume: 2000

## 2020-03-24 DIAGNOSIS — J302 Other seasonal allergic rhinitis: Secondary | ICD-10-CM | POA: Diagnosis not present

## 2020-03-24 DIAGNOSIS — K219 Gastro-esophageal reflux disease without esophagitis: Secondary | ICD-10-CM | POA: Diagnosis not present

## 2020-03-24 DIAGNOSIS — G14 Postpolio syndrome: Secondary | ICD-10-CM | POA: Diagnosis not present

## 2020-03-24 DIAGNOSIS — R739 Hyperglycemia, unspecified: Secondary | ICD-10-CM | POA: Diagnosis not present

## 2020-03-24 DIAGNOSIS — J45909 Unspecified asthma, uncomplicated: Secondary | ICD-10-CM | POA: Diagnosis not present

## 2020-03-24 DIAGNOSIS — D696 Thrombocytopenia, unspecified: Secondary | ICD-10-CM | POA: Diagnosis not present

## 2020-03-24 DIAGNOSIS — E8589 Other amyloidosis: Secondary | ICD-10-CM | POA: Diagnosis not present

## 2020-03-30 DIAGNOSIS — D696 Thrombocytopenia, unspecified: Secondary | ICD-10-CM | POA: Diagnosis not present

## 2020-03-30 DIAGNOSIS — R200 Anesthesia of skin: Secondary | ICD-10-CM | POA: Diagnosis not present

## 2020-03-30 DIAGNOSIS — R29898 Other symptoms and signs involving the musculoskeletal system: Secondary | ICD-10-CM | POA: Diagnosis not present

## 2020-03-30 DIAGNOSIS — D649 Anemia, unspecified: Secondary | ICD-10-CM | POA: Diagnosis not present

## 2020-03-30 DIAGNOSIS — J45909 Unspecified asthma, uncomplicated: Secondary | ICD-10-CM | POA: Diagnosis not present

## 2020-03-30 DIAGNOSIS — M545 Low back pain: Secondary | ICD-10-CM | POA: Diagnosis not present

## 2020-03-30 DIAGNOSIS — R7309 Other abnormal glucose: Secondary | ICD-10-CM | POA: Diagnosis not present

## 2020-03-30 DIAGNOSIS — E8589 Other amyloidosis: Secondary | ICD-10-CM | POA: Diagnosis not present

## 2020-03-30 DIAGNOSIS — R413 Other amnesia: Secondary | ICD-10-CM | POA: Diagnosis not present

## 2020-03-30 DIAGNOSIS — Z0000 Encounter for general adult medical examination without abnormal findings: Secondary | ICD-10-CM | POA: Diagnosis not present

## 2020-04-01 ENCOUNTER — Other Ambulatory Visit: Payer: Self-pay | Admitting: Internal Medicine

## 2020-04-01 DIAGNOSIS — G8929 Other chronic pain: Secondary | ICD-10-CM

## 2020-04-01 DIAGNOSIS — M545 Low back pain, unspecified: Secondary | ICD-10-CM

## 2020-04-16 ENCOUNTER — Ambulatory Visit
Admission: RE | Admit: 2020-04-16 | Discharge: 2020-04-16 | Disposition: A | Discharge status: Home / Self Care | Payer: PPO | Source: Ambulatory Visit | Attending: Internal Medicine | Admitting: Internal Medicine

## 2020-04-16 ENCOUNTER — Other Ambulatory Visit: Payer: Self-pay

## 2020-04-16 DIAGNOSIS — M545 Low back pain, unspecified: Secondary | ICD-10-CM

## 2020-04-16 DIAGNOSIS — G8929 Other chronic pain: Secondary | ICD-10-CM | POA: Insufficient documentation

## 2020-04-16 DIAGNOSIS — M5127 Other intervertebral disc displacement, lumbosacral region: Secondary | ICD-10-CM | POA: Diagnosis not present

## 2020-04-16 DIAGNOSIS — M48061 Spinal stenosis, lumbar region without neurogenic claudication: Secondary | ICD-10-CM | POA: Diagnosis not present

## 2020-04-16 MED ORDER — GADOBUTROL 1 MMOL/ML IV SOLN
7.5000 mL | Freq: Once | INTRAVENOUS | Status: AC | PRN
  Administered 2020-04-16: 7.5 mL via INTRAVENOUS

## 2020-04-29 DIAGNOSIS — G8929 Other chronic pain: Secondary | ICD-10-CM | POA: Diagnosis not present

## 2020-04-29 DIAGNOSIS — M545 Low back pain: Secondary | ICD-10-CM | POA: Diagnosis not present

## 2020-05-04 DIAGNOSIS — M545 Low back pain: Secondary | ICD-10-CM | POA: Diagnosis not present

## 2020-05-04 DIAGNOSIS — G8929 Other chronic pain: Secondary | ICD-10-CM | POA: Diagnosis not present

## 2020-05-11 DIAGNOSIS — G8929 Other chronic pain: Secondary | ICD-10-CM | POA: Diagnosis not present

## 2020-05-11 DIAGNOSIS — M545 Low back pain: Secondary | ICD-10-CM | POA: Diagnosis not present

## 2020-05-16 DIAGNOSIS — M545 Low back pain: Secondary | ICD-10-CM | POA: Diagnosis not present

## 2020-05-16 DIAGNOSIS — G8929 Other chronic pain: Secondary | ICD-10-CM | POA: Diagnosis not present

## 2020-05-18 DIAGNOSIS — G8929 Other chronic pain: Secondary | ICD-10-CM | POA: Diagnosis not present

## 2020-05-18 DIAGNOSIS — M545 Low back pain: Secondary | ICD-10-CM | POA: Diagnosis not present

## 2020-05-24 DIAGNOSIS — G8929 Other chronic pain: Secondary | ICD-10-CM | POA: Diagnosis not present

## 2020-05-24 DIAGNOSIS — M545 Low back pain: Secondary | ICD-10-CM | POA: Diagnosis not present

## 2020-05-26 DIAGNOSIS — G8929 Other chronic pain: Secondary | ICD-10-CM | POA: Diagnosis not present

## 2020-05-26 DIAGNOSIS — M545 Low back pain: Secondary | ICD-10-CM | POA: Diagnosis not present

## 2020-06-07 DIAGNOSIS — G8929 Other chronic pain: Secondary | ICD-10-CM | POA: Diagnosis not present

## 2020-06-07 DIAGNOSIS — M545 Low back pain: Secondary | ICD-10-CM | POA: Diagnosis not present

## 2020-06-15 DIAGNOSIS — M545 Low back pain: Secondary | ICD-10-CM | POA: Diagnosis not present

## 2020-06-15 DIAGNOSIS — G8929 Other chronic pain: Secondary | ICD-10-CM | POA: Diagnosis not present

## 2020-06-22 DIAGNOSIS — M545 Low back pain: Secondary | ICD-10-CM | POA: Diagnosis not present

## 2020-06-22 DIAGNOSIS — G8929 Other chronic pain: Secondary | ICD-10-CM | POA: Diagnosis not present

## 2020-06-29 DIAGNOSIS — M545 Low back pain: Secondary | ICD-10-CM | POA: Diagnosis not present

## 2020-06-29 DIAGNOSIS — G8929 Other chronic pain: Secondary | ICD-10-CM | POA: Diagnosis not present

## 2020-07-05 DIAGNOSIS — R05 Cough: Secondary | ICD-10-CM | POA: Diagnosis not present

## 2020-07-05 DIAGNOSIS — Z20822 Contact with and (suspected) exposure to covid-19: Secondary | ICD-10-CM | POA: Diagnosis not present

## 2020-07-05 DIAGNOSIS — K529 Noninfective gastroenteritis and colitis, unspecified: Secondary | ICD-10-CM | POA: Diagnosis not present

## 2020-07-25 DIAGNOSIS — R413 Other amnesia: Secondary | ICD-10-CM | POA: Diagnosis not present

## 2020-07-25 DIAGNOSIS — R200 Anesthesia of skin: Secondary | ICD-10-CM | POA: Diagnosis not present

## 2020-07-25 DIAGNOSIS — D696 Thrombocytopenia, unspecified: Secondary | ICD-10-CM | POA: Diagnosis not present

## 2020-07-25 DIAGNOSIS — D649 Anemia, unspecified: Secondary | ICD-10-CM | POA: Diagnosis not present

## 2020-07-25 DIAGNOSIS — R7309 Other abnormal glucose: Secondary | ICD-10-CM | POA: Diagnosis not present

## 2020-07-25 DIAGNOSIS — J45909 Unspecified asthma, uncomplicated: Secondary | ICD-10-CM | POA: Diagnosis not present

## 2020-07-25 DIAGNOSIS — R29898 Other symptoms and signs involving the musculoskeletal system: Secondary | ICD-10-CM | POA: Diagnosis not present

## 2020-07-25 DIAGNOSIS — M545 Low back pain: Secondary | ICD-10-CM | POA: Diagnosis not present

## 2020-07-25 DIAGNOSIS — E8589 Other amyloidosis: Secondary | ICD-10-CM | POA: Diagnosis not present

## 2020-08-01 DIAGNOSIS — R413 Other amnesia: Secondary | ICD-10-CM | POA: Diagnosis not present

## 2020-08-01 DIAGNOSIS — N400 Enlarged prostate without lower urinary tract symptoms: Secondary | ICD-10-CM | POA: Diagnosis not present

## 2020-08-01 DIAGNOSIS — D696 Thrombocytopenia, unspecified: Secondary | ICD-10-CM | POA: Diagnosis not present

## 2020-08-01 DIAGNOSIS — R7309 Other abnormal glucose: Secondary | ICD-10-CM | POA: Diagnosis not present

## 2020-08-01 DIAGNOSIS — D649 Anemia, unspecified: Secondary | ICD-10-CM | POA: Diagnosis not present

## 2020-08-01 DIAGNOSIS — K219 Gastro-esophageal reflux disease without esophagitis: Secondary | ICD-10-CM | POA: Diagnosis not present

## 2020-08-01 DIAGNOSIS — E8589 Other amyloidosis: Secondary | ICD-10-CM | POA: Diagnosis not present

## 2020-08-01 DIAGNOSIS — J45909 Unspecified asthma, uncomplicated: Secondary | ICD-10-CM | POA: Diagnosis not present

## 2020-08-01 DIAGNOSIS — G14 Postpolio syndrome: Secondary | ICD-10-CM | POA: Diagnosis not present

## 2020-09-19 ENCOUNTER — Other Ambulatory Visit: Payer: Self-pay

## 2020-09-19 ENCOUNTER — Inpatient Hospital Stay: Payer: PPO | Attending: Oncology | Admitting: Oncology

## 2020-09-19 ENCOUNTER — Inpatient Hospital Stay: Payer: PPO

## 2020-09-19 VITALS — BP 142/85 | HR 86 | Temp 97.8°F | Resp 20 | Ht 66.0 in | Wt 160.2 lb

## 2020-09-19 DIAGNOSIS — D696 Thrombocytopenia, unspecified: Secondary | ICD-10-CM | POA: Insufficient documentation

## 2020-09-19 DIAGNOSIS — R439 Unspecified disturbances of smell and taste: Secondary | ICD-10-CM | POA: Insufficient documentation

## 2020-09-19 DIAGNOSIS — E8581 Light chain (AL) amyloidosis: Secondary | ICD-10-CM | POA: Insufficient documentation

## 2020-09-19 DIAGNOSIS — M25561 Pain in right knee: Secondary | ICD-10-CM | POA: Diagnosis not present

## 2020-09-19 DIAGNOSIS — R413 Other amnesia: Secondary | ICD-10-CM | POA: Diagnosis not present

## 2020-09-19 DIAGNOSIS — R197 Diarrhea, unspecified: Secondary | ICD-10-CM | POA: Diagnosis not present

## 2020-09-19 DIAGNOSIS — R634 Abnormal weight loss: Secondary | ICD-10-CM | POA: Diagnosis not present

## 2020-09-19 DIAGNOSIS — I429 Cardiomyopathy, unspecified: Secondary | ICD-10-CM | POA: Diagnosis not present

## 2020-09-19 LAB — CBC WITH DIFFERENTIAL (CANCER CENTER ONLY)
Abs Immature Granulocytes: 0.04 10*3/uL (ref 0.00–0.07)
Basophils Absolute: 0 10*3/uL (ref 0.0–0.1)
Basophils Relative: 1 %
Eosinophils Absolute: 0.1 10*3/uL (ref 0.0–0.5)
Eosinophils Relative: 2 %
HCT: 40.4 % (ref 39.0–52.0)
Hemoglobin: 14.3 g/dL (ref 13.0–17.0)
Immature Granulocytes: 1 %
Lymphocytes Relative: 23 %
Lymphs Abs: 1.5 10*3/uL (ref 0.7–4.0)
MCH: 32.3 pg (ref 26.0–34.0)
MCHC: 35.4 g/dL (ref 30.0–36.0)
MCV: 91.2 fL (ref 80.0–100.0)
Monocytes Absolute: 0.9 10*3/uL (ref 0.1–1.0)
Monocytes Relative: 14 %
Neutro Abs: 3.9 10*3/uL (ref 1.7–7.7)
Neutrophils Relative %: 59 %
Platelet Count: 130 10*3/uL — ABNORMAL LOW (ref 150–400)
RBC: 4.43 MIL/uL (ref 4.22–5.81)
RDW: 12.9 % (ref 11.5–15.5)
WBC Count: 6.4 10*3/uL (ref 4.0–10.5)
nRBC: 0 % (ref 0.0–0.2)

## 2020-09-19 LAB — CMP (CANCER CENTER ONLY)
ALT: 28 U/L (ref 0–44)
AST: 28 U/L (ref 15–41)
Albumin: 4.3 g/dL (ref 3.5–5.0)
Alkaline Phosphatase: 56 U/L (ref 38–126)
Anion gap: 4 — ABNORMAL LOW (ref 5–15)
BUN: 15 mg/dL (ref 8–23)
CO2: 30 mmol/L (ref 22–32)
Calcium: 9.6 mg/dL (ref 8.9–10.3)
Chloride: 104 mmol/L (ref 98–111)
Creatinine: 0.81 mg/dL (ref 0.61–1.24)
GFR, Estimated: >60 mL/min (ref >60)
Glucose, Bld: 101 mg/dL — ABNORMAL HIGH (ref 70–99)
Potassium: 4.8 mmol/L (ref 3.5–5.1)
Sodium: 138 mmol/L (ref 135–145)
Total Bilirubin: 0.7 mg/dL (ref 0.3–1.2)
Total Protein: 7.3 g/dL (ref 6.5–8.1)

## 2020-09-19 NOTE — Progress Notes (Signed)
  [place] OFFICE PROGRESS NOTE   Diagnosis: Amyloidosis  INTERVAL HISTORY:   Eugene Martinez returns as scheduled.  He is here with his wife.  He was recently started on Aricept for memory loss.  He relates weight loss to altered taste and poor appetite.  He reports loss of taste since he was diagnosed with amyloidosis.  He has a poor appetite, but is drinking to boost supplements daily.  He reports recent weight gain.  Eugene Martinez has diarrhea for the past 2 days.  No consistent diarrhea, nausea, or fever.  He is wearing a brace for right knee pain.  Objective:  Vital signs in last 24 hours:  Blood pressure (!) 142/85, pulse 86, temperature 97.8 F (36.6 C), temperature source Tympanic, resp. rate 20, height 5\' 6"  (1.676 m), weight 160 lb 3.2 oz (72.7 kg), SpO2 98 %.    HEENT: No macroglossia or thrush Resp: Lungs clear bilaterally Cardio: Regular rate and rhythm, split S2 versus an S3 GI: No hepatosplenomegaly, nontender Vascular: No leg edema  Lab Results:  Lab Results  Component Value Date   WBC 6.4 09/19/2020   HGB 14.3 09/19/2020   HCT 40.4 09/19/2020   MCV 91.2 09/19/2020   PLT 130 (L) 09/19/2020   NEUTROABS 3.9 09/19/2020    CMP  Lab Results  Component Value Date   NA 139 01/21/2020   K 4.8 01/21/2020   CL 105 01/21/2020   CO2 27 01/21/2020   GLUCOSE 119 (H) 01/21/2020   BUN 21 01/21/2020   CREATININE 0.83 01/21/2020   CALCIUM 9.4 01/21/2020   PROT 7.5 01/21/2020   ALBUMIN 4.5 01/21/2020   AST 30 01/21/2020   ALT 35 01/21/2020   ALKPHOS 58 01/21/2020   BILITOT 0.7 01/21/2020   GFRNONAA >60 01/21/2020   GFRAA >60 01/21/2020     Medications: I have reviewed the patient's current medications.   Assessment/Plan:  1. AL Amyloidosis, lambda light chain, diagnosed in December 2013-most recently treated with every 2 week Velcade/Decadron, last given 03/19/2014   Status post autologous stem cell collection at [facility] 04/06/2014  Urine  protein improved and serum light chains stable 05/08/2016  Urine protein stable, light chains stable 07/22/2018  Urine protein stable, light chains stable 01/19/2019  Urine protein stable, light chains stable 07/20/2019 2. 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 Followed bycardiology, echocardiogram September 2015 with an LVEF of 65-70% and severe LVH 4. 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 Zoster rash December 2016    Disposition: Eugene Martinez has a history of amyloidosis.  He is stable from a hematologic standpoint.  No clinical evidence for progression of the amyloidosis.  The 24-hour urine protein remains low when he was here in February.  We will follow up on the 24-hour urine electrophoresis from today.  He will return for an office and lab visit in 6 months.  The weight loss may be related to dementia.  He and his wife look contact us if he has progressive weight loss, consistent diarrhea, or new symptoms.  Betsy Coder, MD  09/19/2020  12:31 PM

## 2020-09-20 LAB — KAPPA/LAMBDA LIGHT CHAINS
Kappa free light chain: 19.2 mg/L (ref 3.3–19.4)
Kappa, lambda light chain ratio: 0.92 (ref 0.26–1.65)
Lambda free light chains: 20.9 mg/L (ref 5.7–26.3)

## 2020-09-20 LAB — PROTEIN ELECTROPHORESIS, SERUM
A/G Ratio: 1.6 (ref 0.7–1.7)
Albumin ELP: 4.2 g/dL (ref 2.9–4.4)
Alpha-1-Globulin: 0.1 g/dL (ref 0.0–0.4)
Alpha-2-Globulin: 0.7 g/dL (ref 0.4–1.0)
Beta Globulin: 1 g/dL (ref 0.7–1.3)
Gamma Globulin: 0.9 g/dL (ref 0.4–1.8)
Globulin, Total: 2.7 g/dL (ref 2.2–3.9)
Total Protein ELP: 6.9 g/dL (ref 6.0–8.5)

## 2020-09-21 LAB — UIFE/LIGHT CHAINS/TP QN, 24-HR UR
FR KAPPA LT CH,24HR: 2.48 mg/24 hr
FR LAMBDA LT CH,24HR: <1.01 mg/24 hr
Free Kappa Lt Chains,Ur: 1.65 mg/L (ref 0.63–113.79)
Free Kappa/Lambda Ratio: >2.46 (ref 1.03–31.76)
Free Lambda Lt Chains,Ur: <0.67 mg/L (ref 0.47–11.77)
Total Protein, Urine-Ur/day: <60 mg/24 hr (ref 30–150)
Total Protein, Urine: <4 mg/dL
Total Volume: 1500

## 2020-09-23 ENCOUNTER — Telehealth: Payer: Self-pay | Admitting: *Deleted

## 2020-09-23 NOTE — Telephone Encounter (Signed)
-----   Message from Ladell Pier, MD sent at 09/21/2020  1:57 PM EDT ----- Please call patient, urine protein remains low, follow-up as scheduled

## 2020-09-23 NOTE — Telephone Encounter (Signed)
Notified of low urine protein and f/u as scheduled.

## 2020-10-03 NOTE — Progress Notes (Signed)
Cardiology Office Note  Date:  10/04/2020   ID:  Eugene Martinez., DOB February 29, 1946, MRN [id number]  PCP:  Tracie Harrier, MD   Chief Complaint  Patient presents with  . office visit    12 month F/U; Meds verbally reviewed with patient.    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 Followed by oncology, Dr. Benay Spice  severe LVH on echo and MRI,  exertional syncope Post-polio, brace right LE who presents for routine follow-up of his severe LVH, amyloid disease  Follwed by Dr. Benay Spice, for AL amyloidosis Seen a few weeks ago  Aricept for memory loss.  He relates weight loss to altered taste and poor appetite.  Echo 07/2018 Left ventricle: The cavity size was normal. There was severe  concentric hypertrophy. Systolic function was normal. The  estimated ejection fraction was in the range of 55% to 60%  Recent falls, mechanical Gait instability  Retired, former Optometrist No regular exercise program  Lab work reviewed with him Total chol 142, LDL 82  Back surgery 01/2019 at [facility] stable  EKG personally reviewed by myself on todays visit Shows normal sinus rhythm , rate 91 bpm  right bundle branch block, left anterior fascicular block with LVH No change from prior EKG  Other past medical history reviewed Echocardiogram August 2019, no significant change from echocardiogram September 2015 Left ventricle: The cavity size was normal. There was severe concentric hypertrophy. Systolic function was normal. The estimated ejection fraction was in the range of 55% to 60%. Wall motion was normal; there were no regional wall motion abnormalities. Features are consistent with a pseudonormal left ventricular filling pattern, with concomitant abnormal relaxation and increased filling pressure (grade 2  diastolic dysfunction). - Pulmonary arteries: Systolic pressure was within the normal range.   September 2015 hospitalized at [facility] hospital after experiencing exertional syncope while walking up steps    echocardiogram with  significant left ventricular hypertrophy and a speckling of the myocardium   Systolic function was normal with ejection fraction of 55-60% and the left atrium was moderately dilated.  cardiac MRI 2013 Severe LVH, Left ventricular wall motion was normal with an ejection fraction of 66%. mild right ventricular hypertrophy and normal right atrial size and mild to moderate mitral regurgitation.  urine immunoelectrophoresis   light chain Bence-Jones protein in the urine.  fat pad biopsy which was not diagnostic for myeloma or amyloidosis and a bone marrow biopsy which was not diagnostic.  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  completed chemotherapy for his amyloidosis and has been in remission since December 2014. He has had previous harvest of stem cells down at [facility] but has not had a stem cell transplant.  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  PSH:    Past Surgical History:  Procedure Laterality Date  . CIRCUMCISION  1984  . COLONOSCOPY  03/2012  . COLONOSCOPY WITH PROPOFOL N/A  10/29/2017   Procedure: COLONOSCOPY WITH PROPOFOL;  Surgeon: Lollie Sails, MD;  Location: [facility] ENDOSCOPY;  Service: Endoscopy;  Laterality: N/A;  . FOOT SURGERY     right foot  . FOOT SURGERY Right 2017  . LEG SURGERIES     HX.OF MULTIPLE SURGERIES FOR LEG LENGTH DUE TO POLIO  . LUMBAR LAMINECTOMY/DECOMPRESSION MICRODISCECTOMY N/A 01/26/2019   Procedure: LUMBAR LAMINECTOMY/DECOMPRESSION MICRODISCECTOMY 1 LEVEL L3-4;  Surgeon: Deetta Perla, MD;  Location: [facility] ORS;  Service: Neurosurgery;  Laterality: N/A;  . NECK SURGERY    . PILONIDAL CYST / SINUS EXCISION      Current Outpatient Medications  Medication Sig Dispense Refill  . acetaminophen (TYLENOL) 500 MG tablet Take 1,000 mg by mouth every 6 (six) hours as needed.     Marland Kitchen aspirin 81 MG chewable tablet Chew 81 mg by mouth daily.    Marland Kitchen azelastine (ASTELIN) 137 MCG/SPRAY nasal spray Place 1-2 sprays into both nostrils every 12 (twelve) hours as needed (allergies).     . cholecalciferol (VITAMIN D) 1000 UNITS tablet Take 1,000 Units by mouth at bedtime.     . donepezil (ARICEPT) 5 MG tablet Take 5 mg by mouth at bedtime.    . ferrous sulfate 325 (65 FE) MG tablet Take 325 mg by mouth daily.    . finasteride (PROSCAR) 5 MG tablet TAKE 1 TABLET BY MOUTH EVERY DAY    . iron polysaccharides (FERREX 150) 150 MG capsule daily.    . Multiple Vitamins-Minerals (CENTRUM SILVER ULTRA MENS) TABS Take 1 tablet by mouth every morning.     . mupirocin ointment (BACTROBAN) 2 % Apply topically as needed.     . Omega-3 Fatty Acids (FISH OIL) 1200 MG CAPS Take 1,200 mg by mouth 2 (two) times daily.     Marland Kitchen omeprazole (PRILOSEC) 20 MG capsule Take 20 mg by mouth every morning.    Marland Kitchen oxyCODONE (ROXICODONE) 5 MG immediate release tablet Take 1 tablet (5 mg total) by mouth every 4 (four) hours as needed for breakthrough pain. 30 tablet 0  . triamcinolone (NASACORT ALLERGY 24HR) 55 MCG/ACT AERO nasal inhaler Place 2 sprays daily into the nose.    . triamcinolone  cream (KENALOG) 0.1 % Apply 1 application topically daily as needed (skin rash).   2  . vitamin E 400 UNIT capsule Take 400 Units by mouth every morning.     Marland Kitchen Apoaequorin (PREVAGEN PO) Take 1 capsule by mouth daily.     No current facility-administered medications for this visit.     Allergies:   Other   Social History:  The patient  reports that he has never smoked. He has never used smokeless tobacco. He reports that he does not drink alcohol and does not use drugs.   Family History:   family history includes Heart attack in his father.    Review of Systems: Review of Systems  Constitutional: Negative.   Respiratory: Negative.   Cardiovascular: Negative.   Gastrointestinal: Negative.   Musculoskeletal: Positive for joint pain.  Neurological: Negative.   Psychiatric/Behavioral: Negative.   All other systems reviewed and are negative.   PHYSICAL EXAM: VS:  BP 120/60 (BP Location: Left Arm, Patient Position: Sitting, Cuff Size: Normal)   Pulse 91   Ht _0  (1.676 m)   Wt 164 lb (74.4 kg)   SpO2 97%   BMI 26.47 kg/m  , BMI Body mass index is 26.47 kg/m.  Constitutional:  oriented to person, place, and time. No distress.  HENT:  Head: Grossly normal Eyes:  no discharge. No scleral icterus.  Neck: No JVD, no carotid bruits  Cardiovascular: Regular rate and rhythm, no murmurs appreciated Pulmonary/Chest: Clear to auscultation bilaterally, no wheezes or rails Abdominal: Soft.  no distension.  no tenderness.  Musculoskeletal: Normal range of motion Neurological:  normal muscle tone. Coordination normal. No atrophy Skin: Skin warm and dry Psychiatric: normal affect, pleasant   Recent Labs: 09/19/2020: ALT 28; BUN 15; Creatinine 0.81; Hemoglobin 14.3; Platelet Count 130; Potassium 4.8; Sodium 138    Lipid Panel Lab Results  Component Value Date   CHOL 209 (H) 10/31/2012   HDL 37 (L) 10/31/2012   LDLCALC 148 (H) 10/31/2012   TRIG 118 10/31/2012    Wt Readings  from Last 3 Encounters:  10/04/20 164 lb (74.4 kg)  09/19/20 160 lb 3.2 oz (72.7 kg)  01/21/20 169 lb 11.2 oz (77 kg)     ASSESSMENT AND PLAN:  Essential hypertension - Plan: EKG 12-Lead Blood pressure is well controlled on today's visit. No changes made to the medications.  Syncope, unspecified syncope type Denies any near-syncope or syncope   severe LVH anatomy  on echocardiogram Mechanical falls, unrelated  AL amyloidosis (HCC) Stable symptoms Previous chemotherapy, stable since 2014 Followed by oncology stable  LVH (left ventricular hypertrophy)  severe LVH on echocardiogram and MRI Echo 2 years ago  Hyperlipidemia not on a statin numbners stable   Total encounter time more than 25 minutes  Greater than 50% was spent in counseling and coordination of care with the patient   No orders of the defined types were placed in this encounter.    Signed, Esmond Plants, M.D., Ph.D. 10/04/2020  [place], [place]

## 2020-10-04 ENCOUNTER — Encounter: Payer: Self-pay | Admitting: Cardiovascular Disease

## 2020-10-04 ENCOUNTER — Other Ambulatory Visit: Payer: Self-pay

## 2020-10-04 ENCOUNTER — Ambulatory Visit: Payer: PPO | Admitting: Cardiovascular Disease

## 2020-10-04 VITALS — BP 120/60 | HR 91 | Ht 66.0 in | Wt 164.0 lb

## 2020-10-04 DIAGNOSIS — E8581 Light chain (AL) amyloidosis: Secondary | ICD-10-CM | POA: Diagnosis not present

## 2020-10-04 DIAGNOSIS — I517 Cardiomegaly: Secondary | ICD-10-CM | POA: Diagnosis not present

## 2020-10-04 DIAGNOSIS — I5032 Chronic diastolic (congestive) heart failure: Secondary | ICD-10-CM | POA: Diagnosis not present

## 2020-10-04 DIAGNOSIS — I10 Essential (primary) hypertension: Secondary | ICD-10-CM

## 2020-10-04 DIAGNOSIS — R55 Syncope and collapse: Secondary | ICD-10-CM

## 2020-10-04 NOTE — Patient Instructions (Signed)
Medication Instructions:  No changes  If you need a refill on your cardiac medications before your next appointment, please call your pharmacy.    Lab work: No new labs needed   If you have labs (blood work) drawn today and your tests are completely normal, you will receive your results only by: . MyChart Message (if you have MyChart) OR . A paper copy in the mail If you have any lab test that is abnormal or we need to change your treatment, we will call you to review the results.   Testing/Procedures: No new testing needed   Follow-Up: At [facility], you and your health needs are our priority.  As part of our continuing mission to provide you with exceptional heart care, we have created designated Provider Care Teams.  These Care Teams include your primary Cardiologist (physician) and Advanced Practice Providers (APPs -  Physician Assistants and Nurse Practitioners) who all work together to provide you with the care you need, when you need it.  . You will need a follow up appointment in 12 months  . Providers on your designated Care Team:   . Christopher Berge, NP . Ryan Dunn, PA-C . Jacquelyn Visser, PA-C  Any Other Special Instructions Will Be Listed Below (If Applicable).  COVID-19 Vaccine Information can be found at: [email or web address] For questions related to vaccine distribution or appointments, please email [email or web address] or call [phone].     

## 2020-11-08 DIAGNOSIS — M545 Low back pain, unspecified: Secondary | ICD-10-CM | POA: Diagnosis not present

## 2020-11-08 DIAGNOSIS — M4324 Fusion of spine, thoracic region: Secondary | ICD-10-CM | POA: Diagnosis not present

## 2020-11-08 DIAGNOSIS — Z981 Arthrodesis status: Secondary | ICD-10-CM | POA: Diagnosis not present

## 2020-11-08 DIAGNOSIS — G8929 Other chronic pain: Secondary | ICD-10-CM | POA: Diagnosis not present

## 2020-11-22 DIAGNOSIS — M545 Low back pain, unspecified: Secondary | ICD-10-CM | POA: Diagnosis not present

## 2020-11-22 DIAGNOSIS — G8929 Other chronic pain: Secondary | ICD-10-CM | POA: Diagnosis not present

## 2020-11-28 ENCOUNTER — Ambulatory Visit: Payer: PPO | Admitting: Dermatology

## 2020-11-28 ENCOUNTER — Other Ambulatory Visit: Payer: Self-pay

## 2020-11-28 DIAGNOSIS — L814 Other melanin hyperpigmentation: Secondary | ICD-10-CM | POA: Diagnosis not present

## 2020-11-28 DIAGNOSIS — L578 Other skin changes due to chronic exposure to nonionizing radiation: Secondary | ICD-10-CM | POA: Diagnosis not present

## 2020-11-28 DIAGNOSIS — D1800 Hemangioma unspecified site: Secondary | ICD-10-CM

## 2020-11-28 DIAGNOSIS — L821 Other seborrheic keratosis: Secondary | ICD-10-CM

## 2020-11-28 DIAGNOSIS — D229 Melanocytic nevi, unspecified: Secondary | ICD-10-CM

## 2020-11-28 DIAGNOSIS — L810 Postinflammatory hyperpigmentation: Secondary | ICD-10-CM | POA: Diagnosis not present

## 2020-11-28 DIAGNOSIS — Z86018 Personal history of other benign neoplasm: Secondary | ICD-10-CM | POA: Diagnosis not present

## 2020-11-28 DIAGNOSIS — Z1283 Encounter for screening for malignant neoplasm of skin: Secondary | ICD-10-CM | POA: Diagnosis not present

## 2020-11-28 DIAGNOSIS — D1722 Benign lipomatous neoplasm of skin and subcutaneous tissue of left arm: Secondary | ICD-10-CM

## 2020-11-28 NOTE — Progress Notes (Signed)
   Follow-Up Visit   Subjective  Eugene Martinez. is a 74 y.o. male who presents for the following: Annual Exam (History of dysplastic nevi). The patient presents for Total-Body Skin Exam (TBSE) for skin cancer screening and mole check.  The following portions of the chart were reviewed this encounter and updated as appropriate:   Tobacco  Allergies  Meds  Problems  Med Hx  Surg Hx  Fam Hx     Review of Systems:  No other skin or systemic complaints except as noted in HPI or Assessment and Plan.  Objective  Well appearing patient in no apparent distress; mood and affect are within normal limits.  A full examination was performed including scalp, head, eyes, ears, nose, lips, neck, chest, axillae, abdomen, back, buttocks, bilateral upper extremities, bilateral lower extremities, hands, feet, fingers, toes, fingernails, and toenails. All findings within normal limits unless otherwise noted below.  Objective  Lower back: Hyperpigmentation  Objective  Left deltoid: Rubbery nodule   Assessment & Plan    Lentigines - Scattered tan macules - Discussed due to sun exposure - Benign, observe - Call for any changes  Seborrheic Keratoses - Stuck-on, waxy, tan-brown papules and plaques  - Discussed benign etiology and prognosis. - Observe - Call for any changes  Melanocytic Nevi - Tan-brown and/or pink-flesh-colored symmetric macules and papules - Benign appearing on exam today - Observation - Call clinic for new or changing moles - Recommend daily use of broad spectrum spf 30+ sunscreen to sun-exposed areas.   Hemangiomas - Red papules - Discussed benign nature - Observe - Call for any changes  Actinic Damage - Chronic, secondary to cumulative UV/sun exposure - diffuse scaly erythematous macules with underlying dyspigmentation - Recommend daily broad spectrum sunscreen SPF 30+ to sun-exposed areas, reapply every 2 hours as needed.  - Call for new or changing  lesions.  Skin cancer screening performed today.  History of Dysplastic Nevi - No evidence of recurrence today - Recommend regular full body skin exams - Recommend daily broad spectrum sunscreen SPF 30+ to sun-exposed areas, reapply every 2 hours as needed.  - Call if any new or changing lesions are noted between office visits  Postinflammatory hyperpigmentation Lower back Secondary to surgical procedure  Lipoma of left upper extremity Left deltoid Benign, observe.   Skin cancer screening  Return in about 1 year (around 11/28/2021) for TBSE.   I, Ashok Cordia, CMA, am acting as scribe for Sarina Ser, MD .  Documentation: I have reviewed the above documentation for accuracy and completeness, and I agree with the above.  Sarina Ser, MD

## 2020-12-01 DIAGNOSIS — G8929 Other chronic pain: Secondary | ICD-10-CM | POA: Diagnosis not present

## 2020-12-01 DIAGNOSIS — M545 Low back pain, unspecified: Secondary | ICD-10-CM | POA: Diagnosis not present

## 2020-12-07 ENCOUNTER — Encounter: Payer: Self-pay | Admitting: Dermatology

## 2020-12-07 DIAGNOSIS — G8929 Other chronic pain: Secondary | ICD-10-CM | POA: Diagnosis not present

## 2020-12-07 DIAGNOSIS — M545 Low back pain, unspecified: Secondary | ICD-10-CM | POA: Diagnosis not present

## 2020-12-15 DIAGNOSIS — G8929 Other chronic pain: Secondary | ICD-10-CM | POA: Diagnosis not present

## 2020-12-15 DIAGNOSIS — M545 Low back pain, unspecified: Secondary | ICD-10-CM | POA: Diagnosis not present

## 2020-12-20 DIAGNOSIS — G8929 Other chronic pain: Secondary | ICD-10-CM | POA: Diagnosis not present

## 2020-12-20 DIAGNOSIS — M545 Low back pain, unspecified: Secondary | ICD-10-CM | POA: Diagnosis not present

## 2020-12-22 DIAGNOSIS — M545 Low back pain, unspecified: Secondary | ICD-10-CM | POA: Diagnosis not present

## 2020-12-22 DIAGNOSIS — G8929 Other chronic pain: Secondary | ICD-10-CM | POA: Diagnosis not present

## 2020-12-29 DIAGNOSIS — M545 Low back pain, unspecified: Secondary | ICD-10-CM | POA: Diagnosis not present

## 2020-12-29 DIAGNOSIS — G8929 Other chronic pain: Secondary | ICD-10-CM | POA: Diagnosis not present

## 2021-01-03 DIAGNOSIS — M545 Low back pain, unspecified: Secondary | ICD-10-CM | POA: Diagnosis not present

## 2021-01-03 DIAGNOSIS — G8929 Other chronic pain: Secondary | ICD-10-CM | POA: Diagnosis not present

## 2021-01-05 DIAGNOSIS — J45909 Unspecified asthma, uncomplicated: Secondary | ICD-10-CM | POA: Diagnosis not present

## 2021-01-05 DIAGNOSIS — R7309 Other abnormal glucose: Secondary | ICD-10-CM | POA: Diagnosis not present

## 2021-01-05 DIAGNOSIS — D696 Thrombocytopenia, unspecified: Secondary | ICD-10-CM | POA: Diagnosis not present

## 2021-01-05 DIAGNOSIS — R413 Other amnesia: Secondary | ICD-10-CM | POA: Diagnosis not present

## 2021-01-05 DIAGNOSIS — K219 Gastro-esophageal reflux disease without esophagitis: Secondary | ICD-10-CM | POA: Diagnosis not present

## 2021-01-05 DIAGNOSIS — G14 Postpolio syndrome: Secondary | ICD-10-CM | POA: Diagnosis not present

## 2021-01-05 DIAGNOSIS — D649 Anemia, unspecified: Secondary | ICD-10-CM | POA: Diagnosis not present

## 2021-01-05 DIAGNOSIS — N400 Enlarged prostate without lower urinary tract symptoms: Secondary | ICD-10-CM | POA: Diagnosis not present

## 2021-01-05 DIAGNOSIS — E8589 Other amyloidosis: Secondary | ICD-10-CM | POA: Diagnosis not present

## 2021-01-12 DIAGNOSIS — J45909 Unspecified asthma, uncomplicated: Secondary | ICD-10-CM | POA: Diagnosis not present

## 2021-01-12 DIAGNOSIS — E8589 Other amyloidosis: Secondary | ICD-10-CM | POA: Diagnosis not present

## 2021-01-12 DIAGNOSIS — Z0000 Encounter for general adult medical examination without abnormal findings: Secondary | ICD-10-CM | POA: Diagnosis not present

## 2021-01-12 DIAGNOSIS — D649 Anemia, unspecified: Secondary | ICD-10-CM | POA: Diagnosis not present

## 2021-01-12 DIAGNOSIS — R7309 Other abnormal glucose: Secondary | ICD-10-CM | POA: Diagnosis not present

## 2021-01-12 DIAGNOSIS — K219 Gastro-esophageal reflux disease without esophagitis: Secondary | ICD-10-CM | POA: Diagnosis not present

## 2021-01-12 DIAGNOSIS — R413 Other amnesia: Secondary | ICD-10-CM | POA: Diagnosis not present

## 2021-01-12 DIAGNOSIS — G14 Postpolio syndrome: Secondary | ICD-10-CM | POA: Diagnosis not present

## 2021-03-19 DIAGNOSIS — R197 Diarrhea, unspecified: Secondary | ICD-10-CM | POA: Diagnosis not present

## 2021-03-19 DIAGNOSIS — R413 Other amnesia: Secondary | ICD-10-CM | POA: Diagnosis not present

## 2021-03-19 DIAGNOSIS — D696 Thrombocytopenia, unspecified: Secondary | ICD-10-CM | POA: Diagnosis not present

## 2021-03-19 DIAGNOSIS — I429 Cardiomyopathy, unspecified: Secondary | ICD-10-CM | POA: Diagnosis not present

## 2021-03-19 DIAGNOSIS — E8581 Light chain (AL) amyloidosis: Secondary | ICD-10-CM | POA: Diagnosis not present

## 2021-03-20 ENCOUNTER — Telehealth: Payer: Self-pay | Admitting: Oncology

## 2021-03-20 ENCOUNTER — Inpatient Hospital Stay: Payer: PPO | Attending: Oncology | Admitting: Oncology

## 2021-03-20 ENCOUNTER — Inpatient Hospital Stay: Payer: PPO

## 2021-03-20 ENCOUNTER — Other Ambulatory Visit: Payer: PPO

## 2021-03-20 ENCOUNTER — Other Ambulatory Visit: Payer: Self-pay

## 2021-03-20 VITALS — BP 140/70 | HR 89 | Temp 98.7°F | Resp 18 | Wt 158.0 lb

## 2021-03-20 DIAGNOSIS — E8581 Light chain (AL) amyloidosis: Secondary | ICD-10-CM

## 2021-03-20 LAB — CMP (CANCER CENTER ONLY)
ALT: 24 U/L (ref 0–44)
AST: 25 U/L (ref 15–41)
Albumin: 4.6 g/dL (ref 3.5–5.0)
Alkaline Phosphatase: 47 U/L (ref 38–126)
Anion gap: 8 (ref 5–15)
BUN: 20 mg/dL (ref 8–23)
CO2: 25 mmol/L (ref 22–32)
Calcium: 9.4 mg/dL (ref 8.9–10.3)
Chloride: 104 mmol/L (ref 98–111)
Creatinine: 0.75 mg/dL (ref 0.61–1.24)
GFR, Estimated: >60 mL/min (ref >60)
Glucose, Bld: 140 mg/dL — ABNORMAL HIGH (ref 70–99)
Potassium: 4.1 mmol/L (ref 3.5–5.1)
Sodium: 137 mmol/L (ref 135–145)
Total Bilirubin: 1 mg/dL (ref 0.3–1.2)
Total Protein: 7 g/dL (ref 6.5–8.1)

## 2021-03-20 LAB — CBC WITH DIFFERENTIAL (CANCER CENTER ONLY)
Abs Immature Granulocytes: 0.04 10*3/uL (ref 0.00–0.07)
Basophils Absolute: 0 10*3/uL (ref 0.0–0.1)
Basophils Relative: 1 %
Eosinophils Absolute: 0.1 10*3/uL (ref 0.0–0.5)
Eosinophils Relative: 1 %
HCT: 40.8 % (ref 39.0–52.0)
Hemoglobin: 14.4 g/dL (ref 13.0–17.0)
Immature Granulocytes: 1 %
Lymphocytes Relative: 21 %
Lymphs Abs: 1.4 10*3/uL (ref 0.7–4.0)
MCH: 31.9 pg (ref 26.0–34.0)
MCHC: 35.3 g/dL (ref 30.0–36.0)
MCV: 90.3 fL (ref 80.0–100.0)
Monocytes Absolute: 0.8 10*3/uL (ref 0.1–1.0)
Monocytes Relative: 12 %
Neutro Abs: 4.3 10*3/uL (ref 1.7–7.7)
Neutrophils Relative %: 64 %
Platelet Count: 139 10*3/uL — ABNORMAL LOW (ref 150–400)
RBC: 4.52 MIL/uL (ref 4.22–5.81)
RDW: 12.9 % (ref 11.5–15.5)
WBC Count: 6.6 10*3/uL (ref 4.0–10.5)
nRBC: 0 % (ref 0.0–0.2)

## 2021-03-20 NOTE — Progress Notes (Signed)
  [place] OFFICE PROGRESS NOTE   Diagnosis: Amyloid  INTERVAL HISTORY:   Eugene Martinez returns as scheduled.  He is here with his wife.  He has diarrhea after eating "[place] "food.  No consistent diarrhea or nausea.  He continues to have memory loss.  He was evaluated by cardiology in October 2021.  Objective:  Vital signs in last 24 hours:  Blood pressure 140/70, pulse 89, temperature 98.7 F (37.1 C), temperature source Oral, resp. rate 18, weight 158 lb (71.7 kg), SpO2 98 %.    HEENT: No macroglossia Lymphatics: No cervical, supraclavicular, axillary, or inguinal nodes Resp: Lungs clear bilaterally Cardio: Regular rate and rhythm, S3 gallop? GI: No hepatosplenomegaly, nontender Vascular: No leg edema  Lab Results:  Lab Results  Component Value Date   WBC 6.6 03/20/2021   HGB 14.4 03/20/2021   HCT 40.8 03/20/2021   MCV 90.3 03/20/2021   PLT 139 (L) 03/20/2021   NEUTROABS 4.3 03/20/2021    CMP  Lab Results  Component Value Date   NA 137 03/20/2021   K 4.1 03/20/2021   CL 104 03/20/2021   CO2 25 03/20/2021   GLUCOSE 140 (H) 03/20/2021   BUN 20 03/20/2021   CREATININE 0.75 03/20/2021   CALCIUM 9.4 03/20/2021   PROT 7.0 03/20/2021   ALBUMIN 4.6 03/20/2021   AST 25 03/20/2021   ALT 24 03/20/2021   ALKPHOS 47 03/20/2021   BILITOT 1.0 03/20/2021   GFRNONAA >60 03/20/2021   GFRAA >60 01/21/2020     Medications: I have reviewed the patient's current medications.   Assessment/Plan: 1. AL Amyloidosis, lambda light chain, diagnosed in December 2013-most recently treated with every 2 week Velcade/Decadron, last given 03/19/2014   Status post autologous stem cell collection at [facility] 04/06/2014  Urine protein improved and serum light chains stable 05/08/2016  Urine protein stable, light chains stable 07/22/2018  Urine protein stable, light chains stable 01/19/2019  Urine protein stable, light chains stable 07/20/2019  Urine protein stable,  light chains stable 09/19/2020 2. 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 Followed bycardiology, echocardiogram September 2015 with an LVEF of 65-70% and severe LVH 4. 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 Zoster rash December 2016     Disposition: Eugene Martinez appears unchanged.  He is stable from a hematologic standpoint.  There is no clinical evidence for progression of the amyloidosis.  He continues follow-up with cardiology for LVH.  We will follow up on the serum light chains and 24-hour urine from today.  He will return for an office and lab visit in 6 months.  He has developed significant memory loss and is now taking Aricept.  His wife was present for today's visit.  Betsy Coder, MD  03/20/2021  11:55 AM

## 2021-03-20 NOTE — Telephone Encounter (Signed)
Scheduled appt per 4/11 los - pt is aware of apt - my chart active.

## 2021-03-21 LAB — UPEP/UIFE/LIGHT CHAINS/TP, 24-HR UR
% BETA, Urine: 0 %
ALPHA 1 URINE: 0 %
Albumin, U: 100 %
Alpha 2, Urine: 0 %
Free Kappa Lt Chains,Ur: 4.46 mg/L (ref 1.17–86.46)
Free Kappa/Lambda Ratio: 3.81 (ref 1.83–14.26)
Free Lambda Lt Chains,Ur: 1.17 mg/L (ref 0.27–15.21)
GAMMA GLOBULIN URINE: 0 %
Total Protein, Urine-Ur/day: 87 mg/24 hr (ref 30–150)
Total Protein, Urine: 5.8 mg/dL
Total Volume: 1500

## 2021-03-21 LAB — KAPPA/LAMBDA LIGHT CHAINS
Kappa free light chain: 18.2 mg/L (ref 3.3–19.4)
Kappa, lambda light chain ratio: 0.98 (ref 0.26–1.65)
Lambda free light chains: 18.5 mg/L (ref 5.7–26.3)

## 2021-03-23 ENCOUNTER — Telehealth: Payer: Self-pay

## 2021-03-23 NOTE — Telephone Encounter (Signed)
Called spoke with pt about most recent lab results(blood and urine) no evidence of progression f/u as scheduled

## 2021-03-23 NOTE — Telephone Encounter (Signed)
-----   Message from Ladell Pier, MD sent at 03/22/2021 12:58 PM EDT ----- Please call patient, the blood and urine test revealed no evidence of progressive myeloma/amyloid, follow-up as scheduled

## 2021-03-31 LAB — PROTEIN ELECTROPHORESIS, SERUM
A/G Ratio: 1.6 (ref 0.7–1.7)
Albumin ELP: 4.2 g/dL (ref 2.9–4.4)
Alpha-1-Globulin: 0.1 g/dL (ref 0.0–0.4)
Alpha-2-Globulin: 0.7 g/dL (ref 0.4–1.0)
Beta Globulin: 1 g/dL (ref 0.7–1.3)
Gamma Globulin: 0.8 g/dL (ref 0.4–1.8)
Globulin, Total: 2.7 g/dL (ref 2.2–3.9)
Total Protein ELP: 6.9 g/dL (ref 6.0–8.5)

## 2021-05-24 DIAGNOSIS — G14 Postpolio syndrome: Secondary | ICD-10-CM | POA: Diagnosis not present

## 2021-05-24 DIAGNOSIS — E8589 Other amyloidosis: Secondary | ICD-10-CM | POA: Diagnosis not present

## 2021-05-24 DIAGNOSIS — D649 Anemia, unspecified: Secondary | ICD-10-CM | POA: Diagnosis not present

## 2021-05-24 DIAGNOSIS — R7309 Other abnormal glucose: Secondary | ICD-10-CM | POA: Diagnosis not present

## 2021-05-24 DIAGNOSIS — R413 Other amnesia: Secondary | ICD-10-CM | POA: Diagnosis not present

## 2021-05-24 DIAGNOSIS — J45909 Unspecified asthma, uncomplicated: Secondary | ICD-10-CM | POA: Diagnosis not present

## 2021-05-24 DIAGNOSIS — K219 Gastro-esophageal reflux disease without esophagitis: Secondary | ICD-10-CM | POA: Diagnosis not present

## 2021-05-24 DIAGNOSIS — Z125 Encounter for screening for malignant neoplasm of prostate: Secondary | ICD-10-CM | POA: Diagnosis not present

## 2021-05-31 DIAGNOSIS — E8589 Other amyloidosis: Secondary | ICD-10-CM | POA: Diagnosis not present

## 2021-05-31 DIAGNOSIS — I5032 Chronic diastolic (congestive) heart failure: Secondary | ICD-10-CM | POA: Diagnosis not present

## 2021-05-31 DIAGNOSIS — J45909 Unspecified asthma, uncomplicated: Secondary | ICD-10-CM | POA: Diagnosis not present

## 2021-05-31 DIAGNOSIS — Z0000 Encounter for general adult medical examination without abnormal findings: Secondary | ICD-10-CM | POA: Diagnosis not present

## 2021-05-31 DIAGNOSIS — G14 Postpolio syndrome: Secondary | ICD-10-CM | POA: Diagnosis not present

## 2021-05-31 DIAGNOSIS — R413 Other amnesia: Secondary | ICD-10-CM | POA: Diagnosis not present

## 2021-05-31 DIAGNOSIS — D696 Thrombocytopenia, unspecified: Secondary | ICD-10-CM | POA: Diagnosis not present

## 2021-05-31 DIAGNOSIS — R7309 Other abnormal glucose: Secondary | ICD-10-CM | POA: Diagnosis not present

## 2021-05-31 DIAGNOSIS — R29898 Other symptoms and signs involving the musculoskeletal system: Secondary | ICD-10-CM | POA: Diagnosis not present

## 2021-05-31 DIAGNOSIS — K219 Gastro-esophageal reflux disease without esophagitis: Secondary | ICD-10-CM | POA: Diagnosis not present

## 2021-07-27 ENCOUNTER — Other Ambulatory Visit: Payer: Self-pay | Admitting: Neurology

## 2021-07-27 ENCOUNTER — Other Ambulatory Visit (HOSPITAL_COMMUNITY): Payer: Self-pay | Admitting: Neurology

## 2021-07-27 DIAGNOSIS — G3184 Mild cognitive impairment, so stated: Secondary | ICD-10-CM

## 2021-07-27 DIAGNOSIS — E538 Deficiency of other specified B group vitamins: Secondary | ICD-10-CM | POA: Diagnosis not present

## 2021-07-27 DIAGNOSIS — E559 Vitamin D deficiency, unspecified: Secondary | ICD-10-CM | POA: Diagnosis not present

## 2021-07-27 DIAGNOSIS — E519 Thiamine deficiency, unspecified: Secondary | ICD-10-CM | POA: Diagnosis not present

## 2021-08-08 ENCOUNTER — Other Ambulatory Visit: Payer: Self-pay

## 2021-08-08 ENCOUNTER — Ambulatory Visit (HOSPITAL_COMMUNITY)
Admission: RE | Admit: 2021-08-08 | Discharge: 2021-08-08 | Disposition: A | Discharge status: Home / Self Care | Payer: PPO | Source: Ambulatory Visit | Attending: Neurology | Admitting: Neurology

## 2021-08-08 DIAGNOSIS — G319 Degenerative disease of nervous system, unspecified: Secondary | ICD-10-CM | POA: Diagnosis not present

## 2021-08-08 DIAGNOSIS — G3184 Mild cognitive impairment, so stated: Secondary | ICD-10-CM | POA: Insufficient documentation

## 2021-09-22 ENCOUNTER — Inpatient Hospital Stay: Payer: PPO | Attending: Oncology | Admitting: Oncology

## 2021-09-22 ENCOUNTER — Other Ambulatory Visit: Payer: Self-pay

## 2021-09-22 ENCOUNTER — Inpatient Hospital Stay: Payer: PPO

## 2021-09-22 VITALS — BP 130/72 | HR 85 | Temp 98.1°F | Resp 20 | Ht 66.0 in | Wt 154.6 lb

## 2021-09-22 DIAGNOSIS — Z79899 Other long term (current) drug therapy: Secondary | ICD-10-CM | POA: Diagnosis not present

## 2021-09-22 DIAGNOSIS — E8581 Light chain (AL) amyloidosis: Secondary | ICD-10-CM | POA: Diagnosis not present

## 2021-09-22 DIAGNOSIS — Z9484 Stem cells transplant status: Secondary | ICD-10-CM | POA: Insufficient documentation

## 2021-09-22 LAB — CBC WITH DIFFERENTIAL (CANCER CENTER ONLY)
Abs Immature Granulocytes: 0.01 10*3/uL (ref 0.00–0.07)
Basophils Absolute: 0 10*3/uL (ref 0.0–0.1)
Basophils Relative: 1 %
Eosinophils Absolute: 0.1 10*3/uL (ref 0.0–0.5)
Eosinophils Relative: 2 %
HCT: 39.7 % (ref 39.0–52.0)
Hemoglobin: 13.9 g/dL (ref 13.0–17.0)
Immature Granulocytes: 0 %
Lymphocytes Relative: 27 %
Lymphs Abs: 1.3 10*3/uL (ref 0.7–4.0)
MCH: 32.2 pg (ref 26.0–34.0)
MCHC: 35 g/dL (ref 30.0–36.0)
MCV: 91.9 fL (ref 80.0–100.0)
Monocytes Absolute: 0.7 10*3/uL (ref 0.1–1.0)
Monocytes Relative: 14 %
Neutro Abs: 2.7 10*3/uL (ref 1.7–7.7)
Neutrophils Relative %: 56 %
Platelet Count: 137 10*3/uL — ABNORMAL LOW (ref 150–400)
RBC: 4.32 MIL/uL (ref 4.22–5.81)
RDW: 12.8 % (ref 11.5–15.5)
WBC Count: 4.9 10*3/uL (ref 4.0–10.5)
nRBC: 0 % (ref 0.0–0.2)

## 2021-09-22 LAB — CMP (CANCER CENTER ONLY)
ALT: 30 U/L (ref 0–44)
AST: 30 U/L (ref 15–41)
Albumin: 4.4 g/dL (ref 3.5–5.0)
Alkaline Phosphatase: 52 U/L (ref 38–126)
Anion gap: 10 (ref 5–15)
BUN: 15 mg/dL (ref 8–23)
CO2: 27 mmol/L (ref 22–32)
Calcium: 9.4 mg/dL (ref 8.9–10.3)
Chloride: 102 mmol/L (ref 98–111)
Creatinine: 0.71 mg/dL (ref 0.61–1.24)
GFR, Estimated: >60 mL/min (ref >60)
Glucose, Bld: 118 mg/dL — ABNORMAL HIGH (ref 70–99)
Potassium: 4.2 mmol/L (ref 3.5–5.1)
Sodium: 139 mmol/L (ref 135–145)
Total Bilirubin: 0.8 mg/dL (ref 0.3–1.2)
Total Protein: 6.7 g/dL (ref 6.5–8.1)

## 2021-09-22 NOTE — Progress Notes (Signed)
  [place] OFFICE PROGRESS NOTE   Diagnosis: Amyloidosis  INTERVAL HISTORY:   Eugene Martinez returns as scheduled.  He is here with his wife.  He feels well.  He had COVID-19 in July.  He reports a good appetite but he does not eat as much as he has in the past.  He has diarrhea approximately once per week.  His wife reports this occurs when he eats beef.  Objective:  Vital signs in last 24 hours:  Blood pressure 130/72, pulse 85, temperature 98.1 F (36.7 C), temperature source Oral, resp. rate 20, height 5\' 6"  (1.676 m), weight 154 lb 9.6 oz (70.1 kg), SpO2 100 %.    HEENT: No macroglossia Resp: Lungs clear bilaterally Cardio: Regular rate and rhythm, split S2 versus S3 GI: No hepatosplenomegaly Vascular: No leg edema  Lab Results:  Lab Results  Component Value Date   WBC 4.9 09/22/2021   HGB 13.9 09/22/2021   HCT 39.7 09/22/2021   MCV 91.9 09/22/2021   PLT 137 (L) 09/22/2021   NEUTROABS 2.7 09/22/2021    CMP  Lab Results  Component Value Date   NA 139 09/22/2021   K 4.2 09/22/2021   CL 102 09/22/2021   CO2 27 09/22/2021   GLUCOSE 118 (H) 09/22/2021   BUN 15 09/22/2021   CREATININE 0.71 09/22/2021   CALCIUM 9.4 09/22/2021   PROT 6.7 09/22/2021   ALBUMIN 4.4 09/22/2021   AST 30 09/22/2021   ALT 30 09/22/2021   ALKPHOS 52 09/22/2021   BILITOT 0.8 09/22/2021   GFRNONAA >60 09/22/2021   GFRAA >60 01/21/2020      Medications: I have reviewed the patient's current medications.    Medications: I have reviewed the patient's current medications.   Assessment/Plan: 1. AL Amyloidosis, lambda light chain, diagnosed in December 2013-most recently treated with every 2 week Velcade/Decadron, last given 03/19/2014   Status post autologous stem cell collection at [facility] 04/06/2014 Urine protein improved and serum light chains stable 05/08/2016 Urine protein stable, light chains stable 07/22/2018 Urine protein stable, light chains stable  01/19/2019 Urine protein stable, light chains stable 07/20/2019 Urine protein stable, light chains stable 09/19/2020 Urine protein stable for 1022 2. 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 Followed by cardiology, echocardiogram September 2015 with an LVEF of 65-70% and severe LVH 4. 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  Zoster rash December 2016 6.  Memory loss 7.  COVID-19 infection July 2022      Disposition: Eugene Martinez appears unchanged.  He is stable from a hematologic standpoint.  We will follow-up on the 24-hour urine protein and electrophoresis from today.  He will return for an office and lab visit in 6 months.  There is no clinical evidence for progression of the amyloidosis.  He is scheduled for cardiology follow-up later this month.     Betsy Coder, MD  09/22/2021  11:51 AM     Disposition: Eugene Earline May[place], MD  09/22/2021  11:51 AM

## 2021-09-25 DIAGNOSIS — R413 Other amnesia: Secondary | ICD-10-CM | POA: Diagnosis not present

## 2021-09-25 DIAGNOSIS — R29898 Other symptoms and signs involving the musculoskeletal system: Secondary | ICD-10-CM | POA: Diagnosis not present

## 2021-09-25 DIAGNOSIS — D696 Thrombocytopenia, unspecified: Secondary | ICD-10-CM | POA: Diagnosis not present

## 2021-09-25 DIAGNOSIS — R7309 Other abnormal glucose: Secondary | ICD-10-CM | POA: Diagnosis not present

## 2021-09-25 DIAGNOSIS — G14 Postpolio syndrome: Secondary | ICD-10-CM | POA: Diagnosis not present

## 2021-09-25 DIAGNOSIS — J45909 Unspecified asthma, uncomplicated: Secondary | ICD-10-CM | POA: Diagnosis not present

## 2021-09-25 DIAGNOSIS — E8589 Other amyloidosis: Secondary | ICD-10-CM | POA: Diagnosis not present

## 2021-09-25 DIAGNOSIS — K219 Gastro-esophageal reflux disease without esophagitis: Secondary | ICD-10-CM | POA: Diagnosis not present

## 2021-09-25 LAB — KAPPA/LAMBDA LIGHT CHAINS
Kappa free light chain: 20.6 mg/L — ABNORMAL HIGH (ref 3.3–19.4)
Kappa, lambda light chain ratio: 1.1 (ref 0.26–1.65)
Lambda free light chains: 18.8 mg/L (ref 5.7–26.3)

## 2021-09-25 LAB — PROTEIN ELECTROPHORESIS, SERUM
A/G Ratio: 1.7 (ref 0.7–1.7)
Albumin ELP: 4 g/dL (ref 2.9–4.4)
Alpha-1-Globulin: 0.1 g/dL (ref 0.0–0.4)
Alpha-2-Globulin: 0.6 g/dL (ref 0.4–1.0)
Beta Globulin: 0.8 g/dL (ref 0.7–1.3)
Gamma Globulin: 0.8 g/dL (ref 0.4–1.8)
Globulin, Total: 2.4 g/dL (ref 2.2–3.9)
Total Protein ELP: 6.4 g/dL (ref 6.0–8.5)

## 2021-09-25 LAB — UIFE/LIGHT CHAINS/TP QN, 24-HR UR
FR KAPPA LT CH,24HR: 4.36 mg/24 hr
FR LAMBDA LT CH,24HR: 0.98 mg/24 hr
Free Kappa Lt Chains,Ur: 3.35 mg/L (ref 1.17–86.46)
Free Kappa/Lambda Ratio: 4.47 (ref 1.83–14.26)
Free Lambda Lt Chains,Ur: 0.75 mg/L (ref 0.27–15.21)
Total Protein, Urine-Ur/day: 81 mg/24 hr (ref 30–150)
Total Protein, Urine: 6.2 mg/dL
Total Volume: 1300

## 2021-10-02 DIAGNOSIS — K219 Gastro-esophageal reflux disease without esophagitis: Secondary | ICD-10-CM | POA: Diagnosis not present

## 2021-10-02 DIAGNOSIS — R195 Other fecal abnormalities: Secondary | ICD-10-CM | POA: Diagnosis not present

## 2021-10-02 DIAGNOSIS — R413 Other amnesia: Secondary | ICD-10-CM | POA: Diagnosis not present

## 2021-10-02 DIAGNOSIS — E8589 Other amyloidosis: Secondary | ICD-10-CM | POA: Diagnosis not present

## 2021-10-02 DIAGNOSIS — F330 Major depressive disorder, recurrent, mild: Secondary | ICD-10-CM | POA: Diagnosis not present

## 2021-10-02 DIAGNOSIS — J45909 Unspecified asthma, uncomplicated: Secondary | ICD-10-CM | POA: Diagnosis not present

## 2021-10-02 DIAGNOSIS — D649 Anemia, unspecified: Secondary | ICD-10-CM | POA: Diagnosis not present

## 2021-10-02 NOTE — Progress Notes (Signed)
Cardiology Office Note  Date:  10/03/2021   ID:  Eugene Portner., DOB 12/18/1945, MRN [id number]  PCP:  Tracie Harrier, MD   Chief Complaint  Patient presents with   12 month follow up     "Doing well." Medications reviewed by the patient verbally.     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 treated with every 2 week Velcade/Decadron, last given 03/19/2014  Followed by oncology, Dr. Benay Spice  severe LVH on echo and MRI,  exertional syncope Post-polio, brace right LE who presents for routine follow-up of his severe LVH, amyloid disease  In follow-up today denies any significant recurrence of his amyloidosis at least in the way he feels that No orthostasis, no syncope No shortness of breath but is limited on exertion by prior history of polio No leg swelling, no abdominal distention, no fluid retention in general  does PT 3x a week Walks QOD on the street   He had COVID-19 in July. Reports recovered relatively well  Lab work reviewed A1C 5.7 Total chol 160, LDL 92  Follwed by Dr. Benay Spice, for AL amyloidosis Seen a few weeks ago Urine collection ordered, labs still pending   Aricept for memory loss.   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 There was severe    concentric hypertrophy. Systolic function was normal. The    estimated ejection fraction was in the range of 55% to 60%  Echocardiogram August 2019, no significant change from echocardiogram September 2015 Left ventricle: The cavity size was normal. There was severe concentric hypertrophy. Systolic function  was normal. The estimated ejection fraction was in the range of 55% to 60%. Wall motion was normal; there were no regional wall motion abnormalities. Features are consistent with a pseudonormal left ventricular filling pattern, with concomitant abnormal relaxation and increased filling pressure (grade 2 diastolic dysfunction). - Pulmonary arteries: Systolic pressure was within the normal range.   September 2015 hospitalized at [facility] hospital after experiencing exertional syncope while walking up steps    echocardiogram with  significant left ventricular hypertrophy and a speckling of the myocardium   Systolic function was normal with ejection fraction of 55-60% and the left atrium was moderately dilated.  cardiac MRI 2013 Severe LVH, Left ventricular wall motion was normal with an ejection fraction of 66%. mild right ventricular hypertrophy and normal right atrial size and mild to moderate mitral regurgitation.  urine immunoelectrophoresis   light chain Bence-Jones protein in the urine.  fat pad biopsy which was not diagnostic for myeloma or amyloidosis and a bone marrow biopsy which was not diagnostic.  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  completed chemotherapy for his amyloidosis and has been in remission since December 2014.  He has had previous harvest of stem cells down at [facility] but has not had a stem cell transplant.  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  PSH:    Past Surgical History:  Procedure Laterality Date   CIRCUMCISION  1984   COLONOSCOPY  03/2012   COLONOSCOPY WITH PROPOFOL N/A 10/29/2017   Procedure: COLONOSCOPY WITH PROPOFOL;  Surgeon: Lollie Sails, MD;  Location: [facility] ENDOSCOPY;  Service: Endoscopy;  Laterality: N/A;   FOOT SURGERY     right foot   FOOT SURGERY Right 2017   LEG SURGERIES     HX.OF MULTIPLE SURGERIES FOR LEG LENGTH DUE TO POLIO   LUMBAR LAMINECTOMY/DECOMPRESSION MICRODISCECTOMY N/A 01/26/2019   Procedure: LUMBAR LAMINECTOMY/DECOMPRESSION MICRODISCECTOMY 1 LEVEL L3-4;  Surgeon: Deetta Perla, MD;  Location: [facility] ORS;  Service: Neurosurgery;  Laterality: N/A;   NECK SURGERY     PILONIDAL CYST / SINUS EXCISION      Current Outpatient Medications  Medication Sig Dispense Refill   acetaminophen (TYLENOL) 500 MG tablet Take 1,000 mg by mouth every 6 (six) hours as needed.      Apoaequorin (PREVAGEN PO) Take 1 capsule by mouth daily.     aspirin 81 MG chewable tablet Chew 81 mg by mouth daily.     azelastine (ASTELIN) 137 MCG/SPRAY nasal spray Place 1-2 sprays into both nostrils every 12 (twelve) hours as needed (allergies).     cholecalciferol (VITAMIN D) 1000 UNITS tablet Take 1,000 Units by mouth at bedtime.      donepezil (ARICEPT) 5 MG tablet Take 5 mg by mouth at bedtime.     escitalopram (LEXAPRO) 5 MG tablet Take 5 mg by mouth daily.     ferrous sulfate 325 (65 FE) MG tablet Take 325 mg by mouth daily.     finasteride (PROSCAR) 5 MG tablet TAKE 1 TABLET BY MOUTH EVERY DAY     iron polysaccharides (NIFEREX) 150 MG capsule daily.     Multiple Vitamins-Minerals (CENTRUM SILVER ULTRA MENS) TABS Take 1 tablet by mouth every morning.     mupirocin ointment (BACTROBAN) 2 % Apply topically as  needed.      Omega-3 Fatty Acids (FISH OIL) 1200 MG CAPS Take 1,200 mg by mouth 2 (two) times daily.     omeprazole (PRILOSEC) 20 MG capsule Take 20 mg by mouth every morning.     oxyCODONE (ROXICODONE) 5 MG immediate release tablet Take 1 tablet (5 mg total) by mouth every 4 (four) hours as needed for breakthrough pain. 30 tablet 0   triamcinolone (NASACORT) 55 MCG/ACT AERO nasal inhaler Place 2 sprays daily into the nose.     triamcinolone cream (KENALOG) 0.1 % Apply 1 application topically daily as needed (skin rash).   2   vitamin E 400 UNIT capsule Take 400 Units by mouth every morning.      No current facility-administered medications for this visit.     Allergies:   Other   Social History:  The patient  reports that he has never smoked. He has never used smokeless tobacco. He reports that he does not drink alcohol and does not use drugs.   Family History:   family history includes Heart attack in his father.    Review of Systems: Review of Systems  Constitutional: Negative.   HENT: Negative.    Respiratory: Negative.    Cardiovascular: Negative.  Gastrointestinal: Negative.   Musculoskeletal:  Positive for joint pain.       Leg weakness  Neurological: Negative.   Psychiatric/Behavioral: Negative.    All other systems reviewed and are negative.  PHYSICAL EXAM: VS:  BP 120/60 (BP Location: Left Arm, Patient Position: Sitting, Cuff Size: Normal)   Pulse 88   Ht '5\' 6"'  (1.676 m)   Wt 155 lb 6 oz (70.5 kg)   SpO2 98%   BMI 25.08 kg/m  , BMI Body mass index is 25.08 kg/m. Constitutional:  oriented to person, place, and time. No distress.  HENT:  Head: Grossly normal Eyes:  no discharge. No scleral icterus.  Neck: No JVD, no carotid bruits  Cardiovascular: Regular rate and rhythm, no murmurs appreciated Pulmonary/Chest: Clear to auscultation bilaterally, no wheezes or rails Abdominal: Soft.  no distension.  no tenderness.  Musculoskeletal: Normal range of  motion Neurological:  normal muscle tone. Coordination normal. No atrophy Skin: Skin warm and dry Psychiatric: normal affect, pleasant   Recent Labs: 09/22/2021: ALT 30; BUN 15; Creatinine 0.71; Hemoglobin 13.9; Platelet Count 137; Potassium 4.2; Sodium 139    Lipid Panel Lab Results  Component Value Date   CHOL 209 (H) 10/31/2012   HDL 37 (L) 10/31/2012   LDLCALC 148 (H) 10/31/2012   TRIG 118 10/31/2012    Wt Readings from Last 3 Encounters:  10/03/21 155 lb 6 oz (70.5 kg)  09/22/21 154 lb 9.6 oz (70.1 kg)  03/20/21 158 lb (71.7 kg)     ASSESSMENT AND PLAN:  Essential hypertension - Plan: EKG 12-Lead Blood pressure is well controlled on today's visit. No changes made to the medications.  Syncope, unspecified syncope type Denies any near-syncope or syncope   severe LVH anatomy  on echocardiogram Recommend he stay hydrated  AL amyloidosis ([place]) Previous chemotherapy, stable since 2014 Followed by oncology Repeat testing/urine collection pending Repeat echo ordered, last was 3 years ago  LVH (left ventricular hypertrophy)  severe LVH on echocardiogram and MRI Repeat echocardiogram ordered  Hyperlipidemia not on a statin Total cholesterol down to 161   Total encounter time more than 25 minutes  Greater than 50% was spent in counseling and coordination of care with the patient   Orders Placed This Encounter  Procedures   EKG 12-Lead   ECHOCARDIOGRAM COMPLETE      Signed, Esmond Plants, M.D., Ph.D. 10/03/2021  [facility] [place], [place] [phone]

## 2021-10-03 ENCOUNTER — Encounter: Payer: Self-pay | Admitting: Cardiovascular Disease

## 2021-10-03 ENCOUNTER — Other Ambulatory Visit: Payer: Self-pay

## 2021-10-03 ENCOUNTER — Ambulatory Visit: Payer: PPO | Admitting: Cardiovascular Disease

## 2021-10-03 VITALS — BP 120/60 | HR 88 | Ht 66.0 in | Wt 155.4 lb

## 2021-10-03 DIAGNOSIS — R55 Syncope and collapse: Secondary | ICD-10-CM | POA: Diagnosis not present

## 2021-10-03 DIAGNOSIS — I10 Essential (primary) hypertension: Secondary | ICD-10-CM | POA: Diagnosis not present

## 2021-10-03 DIAGNOSIS — E8581 Light chain (AL) amyloidosis: Secondary | ICD-10-CM

## 2021-10-03 DIAGNOSIS — I517 Cardiomegaly: Secondary | ICD-10-CM | POA: Diagnosis not present

## 2021-10-03 DIAGNOSIS — I5032 Chronic diastolic (congestive) heart failure: Secondary | ICD-10-CM

## 2021-10-03 DIAGNOSIS — E859 Amyloidosis, unspecified: Secondary | ICD-10-CM

## 2021-10-03 NOTE — Patient Instructions (Addendum)
Medication Instructions:  No changes  If you need a refill on your cardiac medications before your next appointment, please call your pharmacy.   Lab work: No new labs needed  Testing/Procedures: ECHO Your physician has requested that you have an echocardiogram. Echocardiography is a painless test that uses sound waves to create images of your heart. It provides your doctor with information about the size and shape of your heart and how well your heart's chambers and valves are working. This procedure takes approximately one hour. There are no restrictions for this procedure.  There is a possibility that an IV may need to be started during your test to inject an image enhancing agent. This is done to obtain more optimal pictures of your heart. Therefore we ask that you do at least drink some water prior to coming in to hydrate your veins.    Follow-Up: At [facility], you and your health needs are our priority.  As part of our continuing mission to provide you with exceptional heart care, we have created designated Provider Care Teams.  These Care Teams include your primary Cardiologist (physician) and Advanced Practice Providers (APPs -  Physician Assistants and Nurse Practitioners) who all work together to provide you with the care you need, when you need it.  You will need a follow up appointment in 12 months  Providers on your designated Care Team:   Murray Hodgkins, NP Christell Faith, PA-C Cadence Kathlen Mody, [place]  COVID-19 Vaccine Information can be found at: [email or web address].uk For questions related to vaccine distribution or appointments, please email [email or web address] or call 4508107372.

## 2021-10-05 ENCOUNTER — Other Ambulatory Visit: Payer: Self-pay

## 2021-10-05 ENCOUNTER — Ambulatory Visit (INDEPENDENT_AMBULATORY_CARE_PROVIDER_SITE_OTHER): Payer: PPO

## 2021-10-05 DIAGNOSIS — E8581 Light chain (AL) amyloidosis: Secondary | ICD-10-CM

## 2021-10-05 DIAGNOSIS — I517 Cardiomegaly: Secondary | ICD-10-CM | POA: Diagnosis not present

## 2021-10-05 LAB — ECHOCARDIOGRAM COMPLETE
AR max vel: 2.74 cm2
AV Area VTI: 2.5 cm2
AV Area mean vel: 2.45 cm2
AV Mean grad: 6 mmHg
AV Peak grad: 11.7 mmHg
Ao pk vel: 1.71 m/s
Area-P 1/2: 2.75 cm2
Calc EF: 63.8 %
MV VTI: 2.74 cm2
S' Lateral: 2.1 cm
Single Plane A2C EF: 64.1 %
Single Plane A4C EF: 65.7 %

## 2021-10-09 ENCOUNTER — Telehealth: Payer: Self-pay

## 2021-10-09 NOTE — Telephone Encounter (Signed)
Able to reach pt regarding his recent ECHO, Dr. Rockey Situ had a chance to review his results and advised   "Echocardiogram  No significant change compared to prior study  Severe hypertrophy again seen "  Eugene Martinez very thankful for the phone call of his results, all questions and concerns were address with nothing further at this time. Will see at next schedule f/u appt.

## 2021-10-18 DIAGNOSIS — G3184 Mild cognitive impairment, so stated: Secondary | ICD-10-CM | POA: Diagnosis not present

## 2021-10-30 IMAGING — MR MR HEAD W/O CM
21 of 33 series · 25 of 48 positions shown · non-contrast
Comparison: None.

CLINICAL DATA: Mild cognitive impairment.

EXAM:
MRI HEAD WITHOUT CONTRAST
TECHNIQUE: Multiplanar, multiecho pulse sequences of the brain and surrounding
structures were obtained without intravenous contrast.
Additionally, using NeuroQuant software a 3D volumetric analysis of
the brain was performed and is compared to a normative database
adjusted for age, gender and intracranial volume.

[Series 3: DWI · axial · 3.0mm · 0.94mm/px · 1 of 112 slices shown (1 of 2)]
[im 1/112]
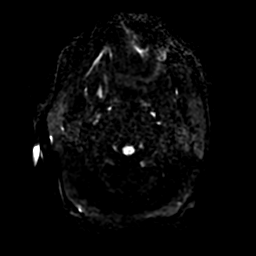

[Series 4: DWI · coronal · 4.0mm · 0.94mm/px · 1 of 79 slices shown (2 of 2)]
[im 1/79]
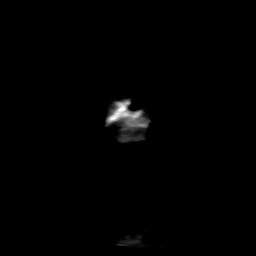

[Series 5: T1 · sagittal · 1.2mm · 1.02mm/px · 1 of 157 slices shown]
[im 1/157]
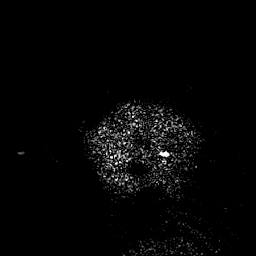

[Series 6: FLAIR · sagittal · 5.0mm · 0.23mm/px · 1 of 28 slices shown (1 of 2)]
[im 1/28]
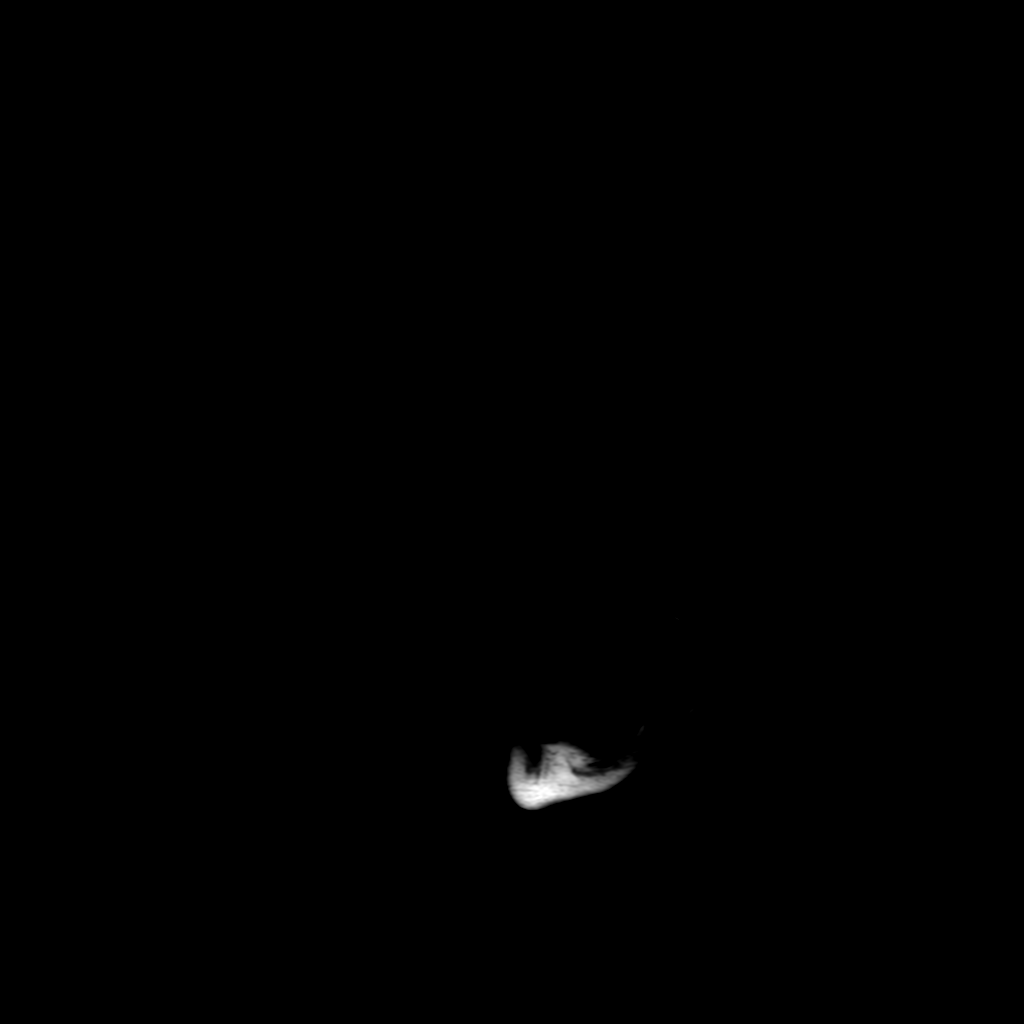

[Series 7: T2 · axial · 5.0mm · 0.23mm/px · 1 of 28 slices shown (1 of 2)]
[im 1/28]
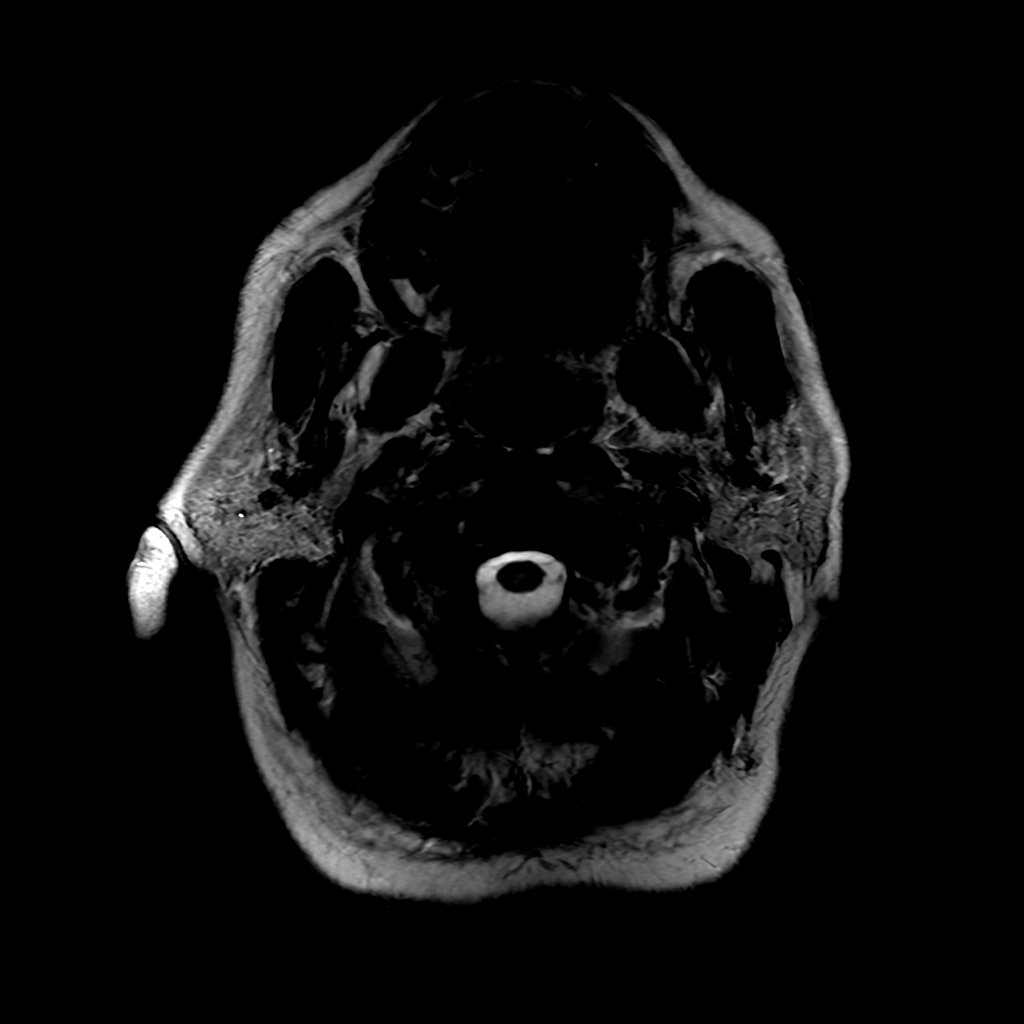

[Series 8: FLAIR · axial · 4.0mm · 0.45mm/px · 1 of 39 slices shown (2 of 2)]
[im 1/39]
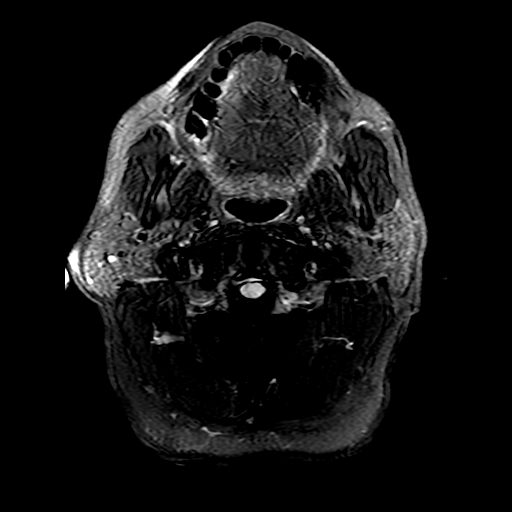

[Series 9: (person_name) · axial · 2.9mm · 0.47mm/px · 1 of 106 slices shown]
[im 1/106]
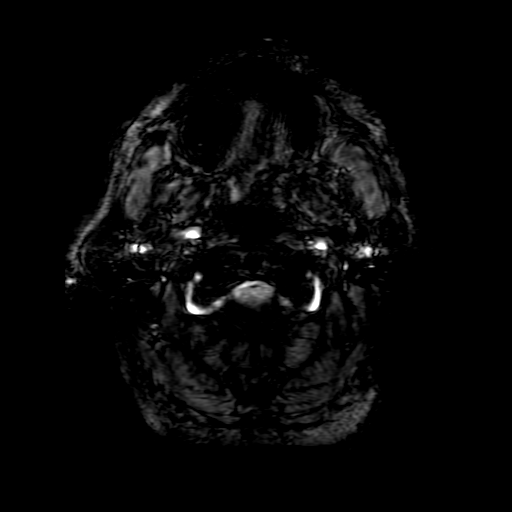

[Series 10: ax 3(person_name) pre · axial · non-contrast · 3.0mm · 0.94mm/px · 1 of 56 slices shown]
[im 1/56]
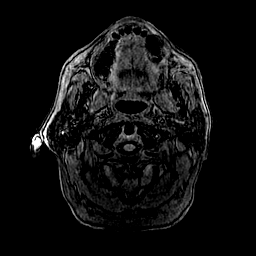

[Series 11: T2 · coronal · 5.0mm · 0.20mm/px · 1 of 32 slices shown (2 of 2)]
[im 1/32]
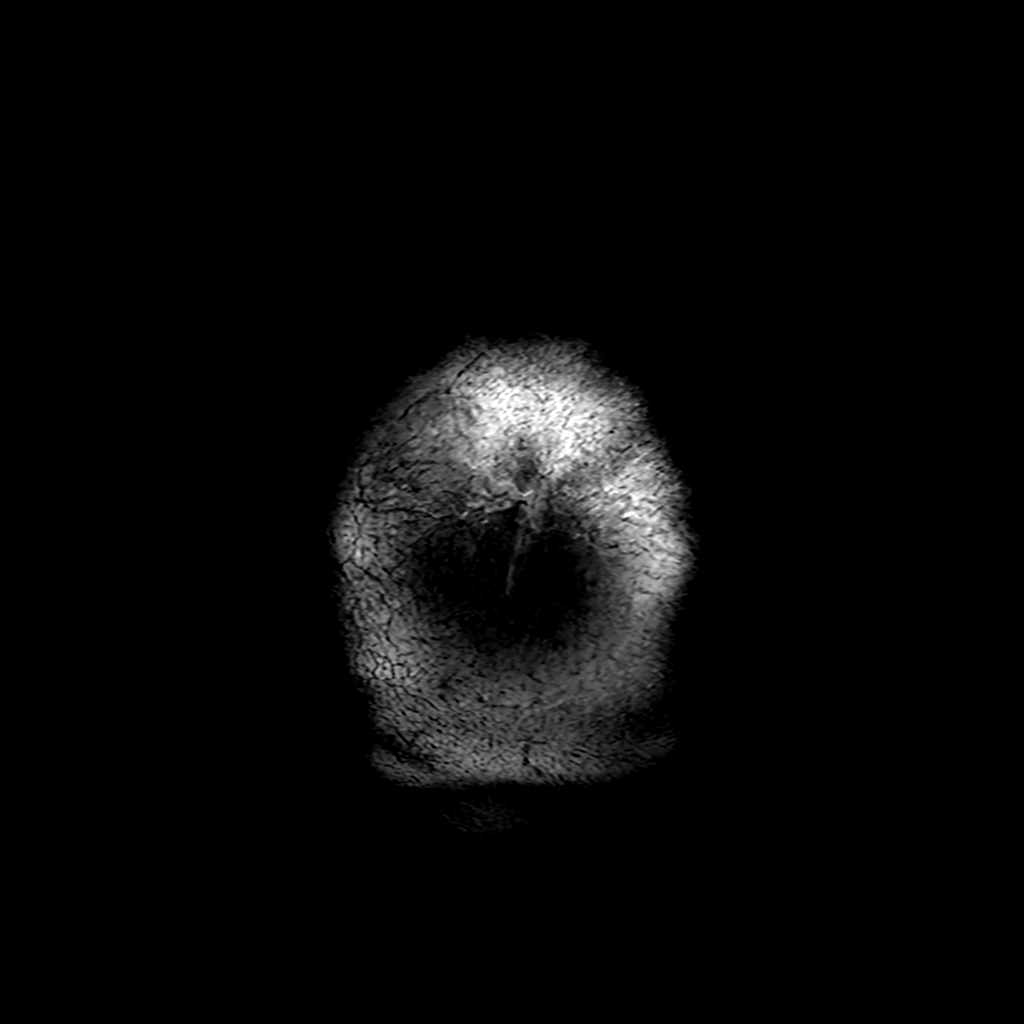

[Series 52: nqsegcorsc · 1.00mm/px · 1 of 240 slices shown (1 of 7)]
[im 1/240]
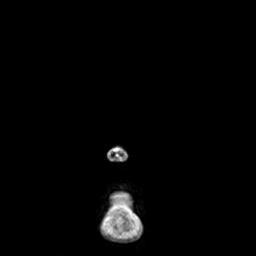

[Series 52: nqsegcorsc · 1.00mm/px · 1 of 240 slices shown (2 of 7)]
[im 1/240]
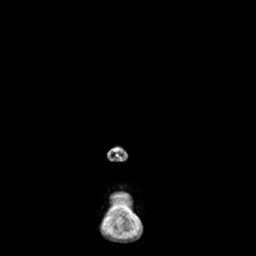

[Series 52: nqsegcorsc · 1.00mm/px · 1 of 240 slices shown (3 of 7)]
[im 1/240]
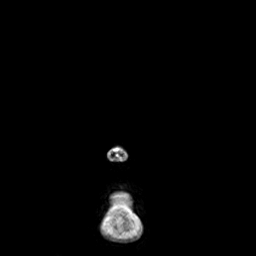

[Series 52: nqsegcorsc · 1.00mm/px · 1 of 240 slices shown (4 of 7)]
[im 1/240]
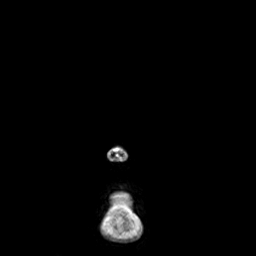

[Series 52: nqsegcorsc · 1.00mm/px · 1 of 240 slices shown (5 of 7)]
[im 1/240]
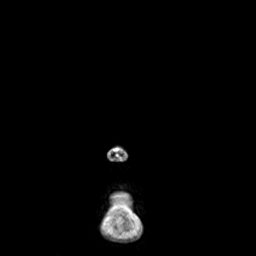

[Series 52: nqsegcorsc · 1.00mm/px · 1 of 240 slices shown (6 of 7)]
[im 1/240]
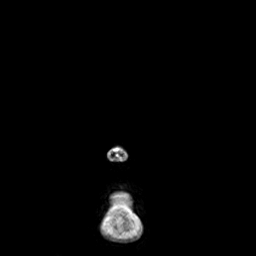

[Series 52: nqsegcorsc · 1.00mm/px · 2 of 240 slices shown (7 of 7)]
[im 1/240]
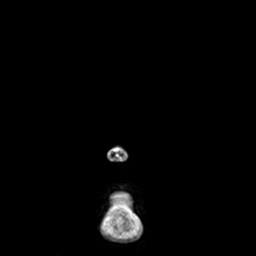
[im 240/240]
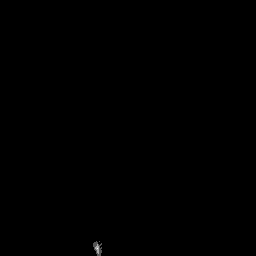

[Series 53: nqsegaxlsc · 1.00mm/px · 2 of 214 slices shown (1 of 3)]
[im 1/214]
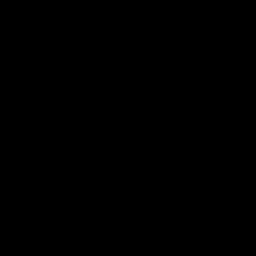
[im 214/214]
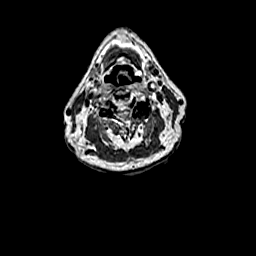

[Series 53: nqsegaxlsc · 1.00mm/px · 2 of 214 slices shown (2 of 3)]
[im 1/214]
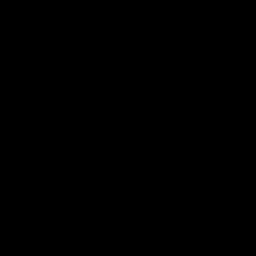
[im 214/214]
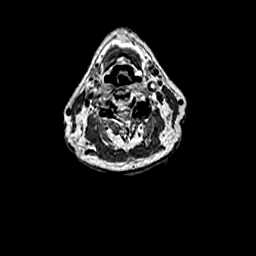

[Series 53: nqsegaxlsc · 1.00mm/px · 2 of 214 slices shown (3 of 3)]
[im 1/214]
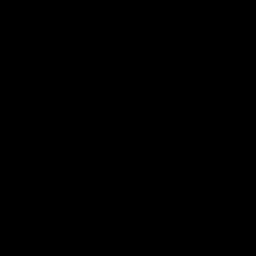
[im 214/214]
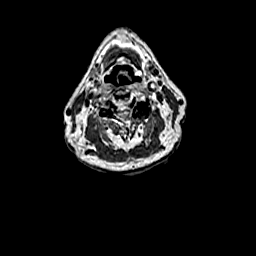

[Series 350: ADC · axial · 3.0mm · 0.94mm/px · 1 of 51 slices shown (1 of 2)]
[im 1/51]
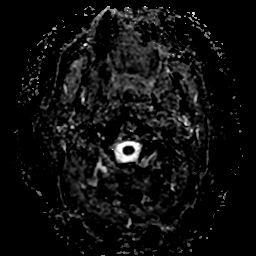

[Series 450: ADC · coronal · 4.0mm · 0.94mm/px · 1 of 39 slices shown (2 of 2)]
[im 1/39]
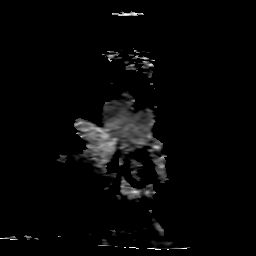

[25 of 48 positions shown; findings below may reference images not displayed]

FINDINGS: Brain: There is no evidence of an acute infarct, intracranial
hemorrhage, mass, midline shift, or extra-axial fluid collection. T2
hyperintensities in the cerebral white matter bilaterally are
nonspecific but compatible with mild chronic small vessel ischemic
disease. There is cerebral atrophy with ex vacuo dilatation of the
ventricles.

Vascular: Major intracranial vascular flow voids are preserved.

Skull and upper cervical spine: Unremarkable bone marrow signal.
Asymmetrically advanced left facet arthrosis at C3-4 with partially
visualized anterior cervical fusion noted below this.

Sinuses/Orbits: Unremarkable orbits. Paranasal sinuses and mastoid
air cells are clear.

Other: None.

NeuroQuant Findings:

Volumetric analysis of the brain was performed, with a fully
detailed report in [HOSPITAL] PACS. Briefly, the comparison with age and
gender matched reference reveals whole brain volume to be at the 4th
percentile.
IMPRESSION: 1. No acute intracranial abnormality.
2. Mild chronic small vessel ischemic disease.
3. NeuroQuant volumetric analysis of the brain, see details on
[HOSPITAL] PACS.

## 2021-11-27 ENCOUNTER — Other Ambulatory Visit: Payer: Self-pay

## 2021-11-27 ENCOUNTER — Ambulatory Visit: Payer: PPO | Admitting: Dermatology

## 2021-11-27 DIAGNOSIS — Z86018 Personal history of other benign neoplasm: Secondary | ICD-10-CM | POA: Diagnosis not present

## 2021-11-27 DIAGNOSIS — Z1283 Encounter for screening for malignant neoplasm of skin: Secondary | ICD-10-CM | POA: Diagnosis not present

## 2021-11-27 DIAGNOSIS — L814 Other melanin hyperpigmentation: Secondary | ICD-10-CM

## 2021-11-27 DIAGNOSIS — L578 Other skin changes due to chronic exposure to nonionizing radiation: Secondary | ICD-10-CM

## 2021-11-27 DIAGNOSIS — L821 Other seborrheic keratosis: Secondary | ICD-10-CM

## 2021-11-27 DIAGNOSIS — D229 Melanocytic nevi, unspecified: Secondary | ICD-10-CM

## 2021-11-27 DIAGNOSIS — D1800 Hemangioma unspecified site: Secondary | ICD-10-CM | POA: Diagnosis not present

## 2021-11-27 NOTE — Patient Instructions (Signed)
Melanoma ABCDEs ? ?Melanoma is the most dangerous type of skin cancer, and is the leading cause of death from skin disease.  You are more likely to develop melanoma if you: ?Have light-colored skin, light-colored eyes, or red or blond hair ?Spend a lot of time in the sun ?Tan regularly, either outdoors or in a tanning bed ?Have had blistering sunburns, especially during childhood ?Have a close family member who has had a melanoma ?Have atypical moles or large birthmarks ? ?Early detection of melanoma is key since treatment is typically straightforward and cure rates are extremely high if we catch it early.  ? ?The first sign of melanoma is often a change in a mole or a new dark spot.  The ABCDE system is a way of remembering the signs of melanoma. ? ?A for asymmetry:  The two halves do not match. ?B for border:  The edges of the growth are irregular. ?C for color:  A mixture of colors are present instead of an even brown color. ?D for diameter:  Melanomas are usually (but not always) greater than 6mm - the size of a pencil eraser. ?E for evolution:  The spot keeps changing in size, shape, and color. ? ?Please check your skin once per month between visits. You can use a small mirror in front and a large mirror behind you to keep an eye on the back side or your body.  ? ?If you see any new or changing lesions before your next follow-up, please call to schedule a visit. ? ?Please continue daily skin protection including broad spectrum sunscreen SPF 30+ to sun-exposed areas, reapplying every 2 hours as needed when you're outdoors.  ? ?Staying in the shade or wearing long sleeves, sun glasses (UVA+UVB protection) and wide brim hats (4-inch brim around the entire circumference of the hat) are also recommended for sun protection.   ? ? ?If You Need Anything After Your Visit ? ?If you have any questions or concerns for your doctor, please call our main line at [phone] and press option 4 to reach your doctor's medical  assistant. If no one answers, please leave a voicemail as directed and we will return your call as soon as possible. Messages left after 4 pm will be answered the following business day.  ? ?You may also send us a message via MyChart. We typically respond to MyChart messages within 1-2 business days. ? ?For prescription refills, please ask your pharmacy to contact our office. Our fax number is [phone]. ? ?If you have an urgent issue when the clinic is closed that cannot wait until the next business day, you can page your doctor at the number below.   ? ?Please note that while we do our best to be available for urgent issues outside of office hours, we are not available 24/7.  ? ?If you have an urgent issue and are unable to reach us, you may choose to seek medical care at your doctor's office, retail clinic, urgent care center, or emergency room. ? ?If you have a medical emergency, please immediately call 911 or go to the emergency department. ? ?Pager Numbers ? ?- Dr. Kowalski: [phone] ? ?- Dr. Moye: [phone] ? ?- Dr. Stewart: [phone] ? ?In the event of inclement weather, please call our main line at [phone] for an update on the status of any delays or closures. ? ?Dermatology Medication Tips: ?Please keep the boxes that topical medications come in in order to help keep track of the instructions   about where and how to use these. Pharmacies typically print the medication instructions only on the boxes and not directly on the medication tubes.  ? ?If your medication is too expensive, please contact our office at [phone] option 4 or send us a message through MyChart.  ? ?We are unable to tell what your co-pay for medications will be in advance as this is different depending on your insurance coverage. However, we may be able to find a substitute medication at lower cost or fill out paperwork to get insurance to cover a needed medication.  ? ?If a prior authorization is required to get your  medication covered by your insurance company, please allow us 1-2 business days to complete this process. ? ?Drug prices often vary depending on where the prescription is filled and some pharmacies may offer cheaper prices. ? ?The website www.goodrx.com contains coupons for medications through different pharmacies. The prices here do not account for what the cost may be with help from insurance (it may be cheaper with your insurance), but the website can give you the price if you did not use any insurance.  ?- You can print the associated coupon and take it with your prescription to the pharmacy.  ?- You may also stop by our office during regular business hours and pick up a GoodRx coupon card.  ?- If you need your prescription sent electronically to a different pharmacy, notify our office through [facility] MyChart or by phone at [phone] option 4. ? ? ? ? ?Si Usted Necesita Algo Despu?s de Su Visita ? ?Tambi?n puede enviarnos un mensaje a trav?s de MyChart. Por lo general respondemos a los mensajes de MyChart en el transcurso de 1 a 2 d?as h?biles. ? ?Para renovar recetas, por favor pida a su farmacia que se ponga en contacto con nuestra oficina. Nuestro n?mero de fax es el [phone]. ? ?Si tiene un asunto urgente cuando la cl?nica est? cerrada y que no puede esperar hasta el siguiente d?a h?bil, puede llamar/localizar a su doctor(a) al n?mero que aparece a continuaci?n.  ? ?Por favor, tenga en cuenta que aunque hacemos todo lo posible para estar disponibles para asuntos urgentes fuera del horario de oficina, no estamos disponibles las 24 horas del d?a, los 7 d?as de la semana.  ? ?Si tiene un problema urgente y no puede comunicarse con nosotros, puede optar por buscar atenci?n m?dica  en el consultorio de su doctor(a), en una cl?nica privada, en un centro de atenci?n urgente o en una sala de emergencias. ? ?Si tiene una emergencia m?dica, por favor llame inmediatamente al 911 o vaya a la sala de  emergencias. ? ?N?meros de b?per ? ?- Dr. Kowalski: [phone] ? ?- Dra. Moye: [phone] ? ?- Dra. Stewart: [phone] ? ?En caso de inclemencias del tiempo, por favor llame a nuestra l?nea principal al [phone] para una actualizaci?n sobre el estado de cualquier retraso o cierre. ? ?Consejos para la medicaci?n en dermatolog?a: ?Por favor, guarde las cajas en las que vienen los medicamentos de uso t?pico para ayudarle a seguir las instrucciones sobre d?nde y c?mo usarlos. Las farmacias generalmente imprimen las instrucciones del medicamento s?lo en las cajas y no directamente en los tubos del medicamento.  ? ?Si su medicamento es muy caro, por favor, p?ngase en contacto con nuestra oficina llamando al [phone] y presione la opci?n 4 o env?enos un mensaje a trav?s de MyChart.  ? ?No podemos decirle cu?l ser? su copago por los medicamentos por adelantado ya que   esto es diferente dependiendo de la cobertura de su seguro. Sin embargo, es posible que podamos encontrar un medicamento sustituto a menor costo o llenar un formulario para que el seguro cubra el medicamento que se considera necesario.  ? ?Si se requiere una autorizaci?n previa para que su compa??a de seguros cubra su medicamento, por favor perm?tanos de 1 a 2 d?as h?biles para completar este proceso. ? ?Los precios de los medicamentos var?an con frecuencia dependiendo del lugar de d?nde se surte la receta y alguna farmacias pueden ofrecer precios m?s baratos. ? ?El sitio web www.goodrx.com tiene cupones para medicamentos de diferentes farmacias. Los precios aqu? no tienen en cuenta lo que podr?a costar con la ayuda del seguro (puede ser m?s barato con su seguro), pero el sitio web puede darle el precio si no utiliz? ning?n seguro.  ?- Puede imprimir el cup?n correspondiente y llevarlo con su receta a la farmacia.  ?- Tambi?n puede pasar por nuestra oficina durante el horario de atenci?n regular y recoger una tarjeta de cupones de GoodRx.  ?- Si  necesita que su receta se env?e electr?nicamente a una farmacia diferente, informe a nuestra oficina a trav?s de MyChart de [place] o por tel?fono llamando al [phone] y presione la opci?n 4. ? ?

## 2021-11-27 NOTE — Progress Notes (Signed)
° °  Follow-Up Visit   Subjective  Eugene Martinez. is a 75 y.o. male who presents for the following: Follow-up (Patient here today for 1 year tbse.). The patient presents for Total-Body Skin Exam (TBSE) for skin cancer screening and mole check.  The patient has spots, moles and lesions to be evaluated, some may be new or changing and the patient has concerns that these could be cancer.  The following portions of the chart were reviewed this encounter and updated as appropriate:  Tobacco   Allergies   Meds   Problems   Med Hx   Surg Hx   Fam Hx      Review of Systems: No other skin or systemic complaints except as noted in HPI or Assessment and Plan.  Objective  Well appearing patient in no apparent distress; mood and affect are within normal limits.  A full examination was performed including scalp, head, eyes, ears, nose, lips, neck, chest, axillae, abdomen, back, buttocks, bilateral upper extremities, bilateral lower extremities, hands, feet, fingers, toes, fingernails, and toenails. All findings within normal limits unless otherwise noted below.   Assessment & Plan  Skin cancer screening  Lentigines - Scattered tan macules - Due to sun exposure - Benign-appearing, observe - Recommend daily broad spectrum sunscreen SPF 30+ to sun-exposed areas, reapply every 2 hours as needed. - Call for any changes  Seborrheic Keratoses - Stuck-on, waxy, tan-brown papules and/or plaques  - Benign-appearing - Discussed benign etiology and prognosis. - Observe - Call for any changes  Melanocytic Nevi - Tan-brown and/or pink-flesh-colored symmetric macules and papules - Benign appearing on exam today - Observation - Call clinic for new or changing moles - Recommend daily use of broad spectrum spf 30+ sunscreen to sun-exposed areas.   Hemangiomas - Red papules - Discussed benign nature - Observe - Call for any changes  Actinic Damage - Chronic condition, secondary to cumulative  UV/sun exposure - diffuse scaly erythematous macules with underlying dyspigmentation - Recommend daily broad spectrum sunscreen SPF 30+ to sun-exposed areas, reapply every 2 hours as needed.  - Staying in the shade or wearing long sleeves, sun glasses (UVA+UVB protection) and wide brim hats (4-inch brim around the entire circumference of the hat) are also recommended for sun protection.  - Call for new or changing lesions.  History of Dysplastic Nevi - No evidence of recurrence today see history  - Recommend regular full body skin exams - Recommend daily broad spectrum sunscreen SPF 30+ to sun-exposed areas, reapply every 2 hours as needed.  - Call if any new or changing lesions are noted between office visits  Skin cancer screening performed today. Return for 1 year tbse . IRuthell Rummage, CMA, am acting as scribe for Sarina Ser, MD. Documentation: I have reviewed the above documentation for accuracy and completeness, and I agree with the above.  Sarina Ser, MD

## 2021-11-30 ENCOUNTER — Encounter: Payer: Self-pay | Admitting: Dermatology

## 2021-12-08 DIAGNOSIS — G14 Postpolio syndrome: Secondary | ICD-10-CM | POA: Diagnosis not present

## 2021-12-08 DIAGNOSIS — E8589 Other amyloidosis: Secondary | ICD-10-CM | POA: Diagnosis not present

## 2021-12-08 DIAGNOSIS — R531 Weakness: Secondary | ICD-10-CM | POA: Diagnosis not present

## 2021-12-08 DIAGNOSIS — R6889 Other general symptoms and signs: Secondary | ICD-10-CM | POA: Diagnosis not present

## 2021-12-08 DIAGNOSIS — J0181 Other acute recurrent sinusitis: Secondary | ICD-10-CM | POA: Diagnosis not present

## 2022-01-18 DIAGNOSIS — R453 Demoralization and apathy: Secondary | ICD-10-CM | POA: Diagnosis not present

## 2022-01-18 DIAGNOSIS — G14 Postpolio syndrome: Secondary | ICD-10-CM | POA: Diagnosis not present

## 2022-01-18 DIAGNOSIS — G3184 Mild cognitive impairment, so stated: Secondary | ICD-10-CM | POA: Diagnosis not present

## 2022-01-29 DIAGNOSIS — R413 Other amnesia: Secondary | ICD-10-CM | POA: Diagnosis not present

## 2022-01-29 DIAGNOSIS — E8589 Other amyloidosis: Secondary | ICD-10-CM | POA: Diagnosis not present

## 2022-01-29 DIAGNOSIS — K219 Gastro-esophageal reflux disease without esophagitis: Secondary | ICD-10-CM | POA: Diagnosis not present

## 2022-01-29 DIAGNOSIS — F330 Major depressive disorder, recurrent, mild: Secondary | ICD-10-CM | POA: Diagnosis not present

## 2022-01-29 DIAGNOSIS — R195 Other fecal abnormalities: Secondary | ICD-10-CM | POA: Diagnosis not present

## 2022-01-29 DIAGNOSIS — D649 Anemia, unspecified: Secondary | ICD-10-CM | POA: Diagnosis not present

## 2022-01-29 DIAGNOSIS — R7309 Other abnormal glucose: Secondary | ICD-10-CM | POA: Diagnosis not present

## 2022-01-29 DIAGNOSIS — J45909 Unspecified asthma, uncomplicated: Secondary | ICD-10-CM | POA: Diagnosis not present

## 2022-02-05 DIAGNOSIS — D696 Thrombocytopenia, unspecified: Secondary | ICD-10-CM | POA: Diagnosis not present

## 2022-02-05 DIAGNOSIS — D649 Anemia, unspecified: Secondary | ICD-10-CM | POA: Diagnosis not present

## 2022-02-05 DIAGNOSIS — F330 Major depressive disorder, recurrent, mild: Secondary | ICD-10-CM | POA: Diagnosis not present

## 2022-02-05 DIAGNOSIS — I5032 Chronic diastolic (congestive) heart failure: Secondary | ICD-10-CM | POA: Diagnosis not present

## 2022-02-05 DIAGNOSIS — E859 Amyloidosis, unspecified: Secondary | ICD-10-CM | POA: Diagnosis not present

## 2022-02-05 DIAGNOSIS — K219 Gastro-esophageal reflux disease without esophagitis: Secondary | ICD-10-CM | POA: Diagnosis not present

## 2022-02-05 DIAGNOSIS — R413 Other amnesia: Secondary | ICD-10-CM | POA: Diagnosis not present

## 2022-02-05 DIAGNOSIS — R195 Other fecal abnormalities: Secondary | ICD-10-CM | POA: Diagnosis not present

## 2022-02-05 DIAGNOSIS — Z0000 Encounter for general adult medical examination without abnormal findings: Secondary | ICD-10-CM | POA: Diagnosis not present

## 2022-02-07 ENCOUNTER — Ambulatory Visit: Payer: PPO | Admitting: Podiatry

## 2022-02-07 ENCOUNTER — Other Ambulatory Visit: Payer: Self-pay

## 2022-02-07 ENCOUNTER — Encounter: Payer: Self-pay | Admitting: Podiatry

## 2022-02-07 DIAGNOSIS — D2371 Other benign neoplasm of skin of right lower limb, including hip: Secondary | ICD-10-CM | POA: Diagnosis not present

## 2022-02-07 NOTE — Progress Notes (Signed)
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 ? ?Objective: Vital signs are stable alert and oriented x3.  Pulses are palpable.  Solitary porokeratotic lesion beneath the fibular sesamoid of the first metatarsophalangeal joint of the right foot.  This was sharply excised today with a chisel blade no iatrogenic lesions were identified or noted. ? ?Assessment: Benign skin lesion right foot. ? ?Plan: Debrided benign skin lesion placed padding follow-up with him as needed ?

## 2022-03-22 DIAGNOSIS — Z7984 Long term (current) use of oral hypoglycemic drugs: Secondary | ICD-10-CM | POA: Insufficient documentation

## 2022-03-22 DIAGNOSIS — Z79899 Other long term (current) drug therapy: Secondary | ICD-10-CM | POA: Diagnosis not present

## 2022-03-22 DIAGNOSIS — E8581 Light chain (AL) amyloidosis: Secondary | ICD-10-CM | POA: Insufficient documentation

## 2022-03-23 ENCOUNTER — Inpatient Hospital Stay: Payer: PPO | Attending: Oncology | Admitting: Oncology

## 2022-03-23 ENCOUNTER — Inpatient Hospital Stay: Payer: PPO

## 2022-03-23 VITALS — BP 137/79 | HR 82 | Temp 98.2°F | Resp 18 | Ht 66.0 in | Wt 156.8 lb

## 2022-03-23 DIAGNOSIS — E8581 Light chain (AL) amyloidosis: Secondary | ICD-10-CM

## 2022-03-23 LAB — CBC WITH DIFFERENTIAL (CANCER CENTER ONLY)
Abs Immature Granulocytes: 0.02 10*3/uL (ref 0.00–0.07)
Basophils Absolute: 0 10*3/uL (ref 0.0–0.1)
Basophils Relative: 1 %
Eosinophils Absolute: 0.2 10*3/uL (ref 0.0–0.5)
Eosinophils Relative: 4 %
HCT: 39.7 % (ref 39.0–52.0)
Hemoglobin: 13.7 g/dL (ref 13.0–17.0)
Immature Granulocytes: 0 %
Lymphocytes Relative: 27 %
Lymphs Abs: 1.4 10*3/uL (ref 0.7–4.0)
MCH: 30.8 pg (ref 26.0–34.0)
MCHC: 34.5 g/dL (ref 30.0–36.0)
MCV: 89.2 fL (ref 80.0–100.0)
Monocytes Absolute: 0.7 10*3/uL (ref 0.1–1.0)
Monocytes Relative: 13 %
Neutro Abs: 2.9 10*3/uL (ref 1.7–7.7)
Neutrophils Relative %: 55 %
Platelet Count: 141 10*3/uL — ABNORMAL LOW (ref 150–400)
RBC: 4.45 MIL/uL (ref 4.22–5.81)
RDW: 13.2 % (ref 11.5–15.5)
WBC Count: 5.2 10*3/uL (ref 4.0–10.5)
nRBC: 0 % (ref 0.0–0.2)

## 2022-03-23 LAB — CMP (CANCER CENTER ONLY)
ALT: 27 U/L (ref 0–44)
AST: 31 U/L (ref 15–41)
Albumin: 4.5 g/dL (ref 3.5–5.0)
Alkaline Phosphatase: 50 U/L (ref 38–126)
Anion gap: 7 (ref 5–15)
BUN: 15 mg/dL (ref 8–23)
CO2: 30 mmol/L (ref 22–32)
Calcium: 9.6 mg/dL (ref 8.9–10.3)
Chloride: 102 mmol/L (ref 98–111)
Creatinine: 0.78 mg/dL (ref 0.61–1.24)
GFR, Estimated: >60 mL/min (ref >60)
Glucose, Bld: 118 mg/dL — ABNORMAL HIGH (ref 70–99)
Potassium: 4.4 mmol/L (ref 3.5–5.1)
Sodium: 139 mmol/L (ref 135–145)
Total Bilirubin: 0.7 mg/dL (ref 0.3–1.2)
Total Protein: 7.3 g/dL (ref 6.5–8.1)

## 2022-03-23 NOTE — Progress Notes (Signed)
?  [place] ?OFFICE PROGRESS NOTE ? ? ?Diagnosis: Amyloidosis ? ?INTERVAL HISTORY:  ? ?Eugene Martinez returns as scheduled.  He is here with his wife.  No new complaint.  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  He reports his brother and sister have also been diagnosed with Alzheimer's. ? ?Objective: ? ?Vital signs in last 24 hours: ? ?Blood pressure 137/79, pulse 82, temperature 98.2 ?F (36.8 ?C), temperature source Oral, resp. rate 18, height '5\' 6"'$  (1.676 m), weight 156 lb 12.8 oz (71.1 kg), SpO2 98 %. ?  ? ?HEENT: No macroglossia ? ?Resp: Lungs clear bilaterally ?Cardio: Regular rate and rhythm ?GI: No hepatosplenomegaly ?Vascular: No leg edema ?Neuro: Alert and oriented ? ?Lab Results: ? ?Lab Results  ?Component Value Date  ? WBC 5.2 03/23/2022  ? HGB 13.7 03/23/2022  ? HCT 39.7 03/23/2022  ? MCV 89.2 03/23/2022  ? PLT 141 (L) 03/23/2022  ? NEUTROABS 2.9 03/23/2022  ? ? ?CMP  ?Lab Results  ?Component Value Date  ? NA 139 03/23/2022  ? K 4.4 03/23/2022  ? CL 102 03/23/2022  ? CO2 30 03/23/2022  ? GLUCOSE 118 (H) 03/23/2022  ? BUN 15 03/23/2022  ? CREATININE 0.78 03/23/2022  ? CALCIUM 9.6 03/23/2022  ? PROT 7.3 03/23/2022  ? ALBUMIN 4.5 03/23/2022  ? AST 31 03/23/2022  ? ALT 27 03/23/2022  ? ALKPHOS 50 03/23/2022  ? BILITOT 0.7 03/23/2022  ? GFRNONAA >60 03/23/2022  ? GFRAA >60 01/21/2020  ? ? ? ?Medications: I have reviewed the patient's current medications. ? ? ?Assessment/Plan: ?1.AL Amyloidosis, lambda light chain, diagnosed in December 2013-most recently treated with every 2 week Velcade/Decadron, last given 03/19/2014   ?Status post autologous stem cell collection at [facility] 04/06/2014 ?Urine protein improved and serum light chains stable 05/08/2016 ?Urine protein stable, light chains stable 07/22/2018 ?Urine protein stable, light chains stable 01/19/2019 ?Urine protein stable, light chains stable 07/20/2019 ?Urine protein stable, light chains stable 09/19/2020 ?Urine protein stable, light  chains stable 03/19/2021 ?Urine protein stable, light chains stable 09/22/2021 ?2. 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 Followed by cardiology, echocardiogram September 2015 with an LVEF of 65-70% and severe LVH, October 2022-severe LVH with LVEF 60-65% ?4. 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  Zoster rash December 2016 ?6.  Memory loss ?7.  COVID-19 infection July 2022 ?  ? ? ?Disposition: ?Eugene Martinez appears unchanged.  There is no clinical evidence for progression of the amyloidosis.  His labs are unremarkable today.  We will follow-up on the 24-hour urine electrophoresis and serum light chains.  He will return for an office and lab visit in 6 months ? ?Betsy Coder, MD ? ?03/23/2022  ?11:50 AM ? ? ?

## 2022-03-26 LAB — PROTEIN ELECTROPHORESIS, SERUM
A/G Ratio: 1.4 (ref 0.7–1.7)
Albumin ELP: 4.1 g/dL (ref 2.9–4.4)
Alpha-1-Globulin: 0.2 g/dL (ref 0.0–0.4)
Alpha-2-Globulin: 0.7 g/dL (ref 0.4–1.0)
Beta Globulin: 1 g/dL (ref 0.7–1.3)
Gamma Globulin: 1 g/dL (ref 0.4–1.8)
Globulin, Total: 2.9 g/dL (ref 2.2–3.9)
Total Protein ELP: 7 g/dL (ref 6.0–8.5)

## 2022-03-26 LAB — UIFE/LIGHT CHAINS/TP QN, 24-HR UR
FR KAPPA LT CH,24HR: 4.52 mg/24 hr
FR LAMBDA LT CH,24HR: <1.21 mg/24 hr
Free Kappa Lt Chains,Ur: 2.58 mg/L (ref 1.17–86.46)
Free Kappa/Lambda Ratio: >3.74 (ref 1.83–14.26)
Free Lambda Lt Chains,Ur: <0.69 mg/L (ref 0.27–15.21)
Total Protein, Urine-Ur/day: 109 mg/24 hr (ref 30–150)
Total Protein, Urine: 6.2 mg/dL

## 2022-03-27 ENCOUNTER — Telehealth: Payer: Self-pay

## 2022-03-27 NOTE — Telephone Encounter (Signed)
Pt verbalized understanding.

## 2022-03-27 NOTE — Telephone Encounter (Signed)
-----   Message from Ladell Pier, MD sent at 03/26/2022  5:53 PM EDT ----- ?Please call patient, the 24-hour urine protein is stable, no evidence of progressive disease, follow-up as scheduled ? ?

## 2022-04-17 DIAGNOSIS — J0181 Other acute recurrent sinusitis: Secondary | ICD-10-CM | POA: Diagnosis not present

## 2022-04-17 DIAGNOSIS — G3184 Mild cognitive impairment, so stated: Secondary | ICD-10-CM | POA: Diagnosis not present

## 2022-04-17 DIAGNOSIS — E859 Amyloidosis, unspecified: Secondary | ICD-10-CM | POA: Diagnosis not present

## 2022-04-30 ENCOUNTER — Ambulatory Visit: Payer: PPO | Admitting: Podiatry

## 2022-04-30 ENCOUNTER — Encounter: Payer: Self-pay | Admitting: Podiatry

## 2022-04-30 DIAGNOSIS — D2371 Other benign neoplasm of skin of right lower limb, including hip: Secondary | ICD-10-CM | POA: Diagnosis not present

## 2022-04-30 NOTE — Progress Notes (Signed)
He presents today for follow-up of a painful lesion to the plantar aspect of the fibular sesamoid right foot.  He states that is doing a whole lot better he is very happy with it.  Objective: Pulses are palpable.  No open lesions or wounds.  The lesion to the plantar aspect of the foot is much smaller than it was previously.  No signs of infection.  Assessment: Porokeratosis subfibular sesamoid.  Plan: Debrided lesion today placed padding follow-up with him on an as-needed basis

## 2022-05-30 DIAGNOSIS — E859 Amyloidosis, unspecified: Secondary | ICD-10-CM | POA: Diagnosis not present

## 2022-05-30 DIAGNOSIS — K219 Gastro-esophageal reflux disease without esophagitis: Secondary | ICD-10-CM | POA: Diagnosis not present

## 2022-05-30 DIAGNOSIS — F330 Major depressive disorder, recurrent, mild: Secondary | ICD-10-CM | POA: Diagnosis not present

## 2022-05-30 DIAGNOSIS — R7309 Other abnormal glucose: Secondary | ICD-10-CM | POA: Diagnosis not present

## 2022-05-30 DIAGNOSIS — Z125 Encounter for screening for malignant neoplasm of prostate: Secondary | ICD-10-CM | POA: Diagnosis not present

## 2022-05-30 DIAGNOSIS — D649 Anemia, unspecified: Secondary | ICD-10-CM | POA: Diagnosis not present

## 2022-05-30 DIAGNOSIS — R413 Other amnesia: Secondary | ICD-10-CM | POA: Diagnosis not present

## 2022-05-30 DIAGNOSIS — R195 Other fecal abnormalities: Secondary | ICD-10-CM | POA: Diagnosis not present

## 2022-06-06 DIAGNOSIS — D696 Thrombocytopenia, unspecified: Secondary | ICD-10-CM | POA: Diagnosis not present

## 2022-06-06 DIAGNOSIS — Z0000 Encounter for general adult medical examination without abnormal findings: Secondary | ICD-10-CM | POA: Diagnosis not present

## 2022-06-06 DIAGNOSIS — R195 Other fecal abnormalities: Secondary | ICD-10-CM | POA: Diagnosis not present

## 2022-06-06 DIAGNOSIS — R413 Other amnesia: Secondary | ICD-10-CM | POA: Diagnosis not present

## 2022-06-06 DIAGNOSIS — G14 Postpolio syndrome: Secondary | ICD-10-CM | POA: Diagnosis not present

## 2022-06-06 DIAGNOSIS — E8589 Other amyloidosis: Secondary | ICD-10-CM | POA: Diagnosis not present

## 2022-06-06 DIAGNOSIS — D649 Anemia, unspecified: Secondary | ICD-10-CM | POA: Diagnosis not present

## 2022-06-13 DIAGNOSIS — D649 Anemia, unspecified: Secondary | ICD-10-CM | POA: Diagnosis not present

## 2022-07-19 DIAGNOSIS — G14 Postpolio syndrome: Secondary | ICD-10-CM | POA: Diagnosis not present

## 2022-07-19 DIAGNOSIS — G3184 Mild cognitive impairment, so stated: Secondary | ICD-10-CM | POA: Diagnosis not present

## 2022-07-23 ENCOUNTER — Encounter: Payer: Self-pay | Admitting: Podiatry

## 2022-07-23 ENCOUNTER — Ambulatory Visit: Payer: PPO | Admitting: Podiatry

## 2022-07-23 DIAGNOSIS — D2371 Other benign neoplasm of skin of right lower limb, including hip: Secondary | ICD-10-CM | POA: Diagnosis not present

## 2022-07-23 DIAGNOSIS — M7751 Other enthesopathy of right foot: Secondary | ICD-10-CM

## 2022-07-23 MED ORDER — DEXAMETHASONE SODIUM PHOSPHATE 120 MG/30ML IJ SOLN
2.0000 mg | Freq: Once | INTRAMUSCULAR | Status: AC
  Administered 2022-07-23: 2 mg via INTRA_ARTICULAR

## 2022-07-23 NOTE — Progress Notes (Signed)
He presents today chief complaint of a painful area beneath the fibular sesamoid of the right hallux.  States that is been bothersome since this came back about a month or so ago.  States that is exquisitely painful to walk on it is hard for him to even put on the brakes in the car because of the pain.  Objective: Vital signs are stable he is alert and oriented x3 there is no erythema edema cellulitis drainage or odor he has a benign skin lesion Sub fibular sesamoid with fluctuance beneath it.  No open lesions or wounds are noted.  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  Plan: Discussed etiology pathology and surgical therapies injected dexamethasone local anesthetic beneath the lesion today debrided benign skin lesion placed chemical Salinocaine over the lesion which will be left on for 3 days then washed off thoroughly we will follow-up with him in 3 months.

## 2022-07-27 DIAGNOSIS — F3340 Major depressive disorder, recurrent, in remission, unspecified: Secondary | ICD-10-CM | POA: Diagnosis not present

## 2022-07-27 DIAGNOSIS — R413 Other amnesia: Secondary | ICD-10-CM | POA: Diagnosis not present

## 2022-07-27 DIAGNOSIS — R29898 Other symptoms and signs involving the musculoskeletal system: Secondary | ICD-10-CM | POA: Diagnosis not present

## 2022-07-27 DIAGNOSIS — D649 Anemia, unspecified: Secondary | ICD-10-CM | POA: Diagnosis not present

## 2022-07-27 DIAGNOSIS — G14 Postpolio syndrome: Secondary | ICD-10-CM | POA: Diagnosis not present

## 2022-07-27 DIAGNOSIS — E8589 Other amyloidosis: Secondary | ICD-10-CM | POA: Diagnosis not present

## 2022-09-06 DIAGNOSIS — M21371 Foot drop, right foot: Secondary | ICD-10-CM | POA: Diagnosis not present

## 2022-09-20 ENCOUNTER — Inpatient Hospital Stay: Payer: PPO | Attending: Oncology

## 2022-09-20 ENCOUNTER — Other Ambulatory Visit (HOSPITAL_BASED_OUTPATIENT_CLINIC_OR_DEPARTMENT_OTHER): Payer: Self-pay

## 2022-09-20 ENCOUNTER — Inpatient Hospital Stay: Payer: PPO | Admitting: Oncology

## 2022-09-20 VITALS — BP 133/71 | HR 79 | Temp 98.4°F | Resp 16 | Wt 158.8 lb

## 2022-09-20 DIAGNOSIS — E8581 Light chain (AL) amyloidosis: Secondary | ICD-10-CM

## 2022-09-20 DIAGNOSIS — Z79899 Other long term (current) drug therapy: Secondary | ICD-10-CM | POA: Insufficient documentation

## 2022-09-20 DIAGNOSIS — Z7984 Long term (current) use of oral hypoglycemic drugs: Secondary | ICD-10-CM | POA: Insufficient documentation

## 2022-09-20 LAB — CBC WITH DIFFERENTIAL (CANCER CENTER ONLY)
Abs Immature Granulocytes: 0.03 10*3/uL (ref 0.00–0.07)
Basophils Absolute: 0 10*3/uL (ref 0.0–0.1)
Basophils Relative: 0 %
Eosinophils Absolute: 0.1 10*3/uL (ref 0.0–0.5)
Eosinophils Relative: 1 %
HCT: 36.7 % — ABNORMAL LOW (ref 39.0–52.0)
Hemoglobin: 13.2 g/dL (ref 13.0–17.0)
Immature Granulocytes: 0 %
Lymphocytes Relative: 10 %
Lymphs Abs: 1.1 10*3/uL (ref 0.7–4.0)
MCH: 32.4 pg (ref 26.0–34.0)
MCHC: 36 g/dL (ref 30.0–36.0)
MCV: 90.2 fL (ref 80.0–100.0)
Monocytes Absolute: 1.3 10*3/uL — ABNORMAL HIGH (ref 0.1–1.0)
Monocytes Relative: 12 %
Neutro Abs: 8.4 10*3/uL — ABNORMAL HIGH (ref 1.7–7.7)
Neutrophils Relative %: 77 %
Platelet Count: 130 10*3/uL — ABNORMAL LOW (ref 150–400)
RBC: 4.07 MIL/uL — ABNORMAL LOW (ref 4.22–5.81)
RDW: 12.6 % (ref 11.5–15.5)
WBC Count: 11 10*3/uL — ABNORMAL HIGH (ref 4.0–10.5)
nRBC: 0 % (ref 0.0–0.2)

## 2022-09-20 LAB — CMP (CANCER CENTER ONLY)
ALT: 23 U/L (ref 0–44)
AST: 28 U/L (ref 15–41)
Albumin: 4.5 g/dL (ref 3.5–5.0)
Alkaline Phosphatase: 45 U/L (ref 38–126)
Anion gap: 10 (ref 5–15)
BUN: 17 mg/dL (ref 8–23)
CO2: 26 mmol/L (ref 22–32)
Calcium: 9.6 mg/dL (ref 8.9–10.3)
Chloride: 99 mmol/L (ref 98–111)
Creatinine: 0.82 mg/dL (ref 0.61–1.24)
GFR, Estimated: >60 mL/min (ref >60)
Glucose, Bld: 142 mg/dL — ABNORMAL HIGH (ref 70–99)
Potassium: 4.1 mmol/L (ref 3.5–5.1)
Sodium: 135 mmol/L (ref 135–145)
Total Bilirubin: 0.8 mg/dL (ref 0.3–1.2)
Total Protein: 7.3 g/dL (ref 6.5–8.1)

## 2022-09-20 NOTE — Progress Notes (Signed)
  [place] OFFICE PROGRESS NOTE   Diagnosis: AL amyloidosis  INTERVAL HISTORY:   [name] returns as scheduled.  He feels well.  Good appetite.  No recent infection.  He has recent sinus drainage.  He is scheduled to see cardiology in 2 weeks.  He has received influenza, COVID-19, and RSV vaccines.  Objective:  Vital signs in last 24 hours:  Blood pressure 133/71, pulse 79, temperature 98.4 F (36.9 C), temperature source Oral, resp. rate 16, weight 158 lb 12.8 oz (72 kg), SpO2 98 %.   Lymphatics: No cervical, supraclavicular, or inguinal nodes.  1/2-1 cm soft bilateral axillary nodes Resp: Lungs clear bilaterally Cardio: Distant heart sounds, regular rhythm with occasional pause GI: No hepatosplenomegaly Vascular: No leg edema   Lab Results:  Lab Results  Component Value Date   WBC 11.0 (H) 09/20/2022   HGB 13.2 09/20/2022   HCT 36.7 (L) 09/20/2022   MCV 90.2 09/20/2022   PLT 130 (L) 09/20/2022   NEUTROABS 8.4 (H) 09/20/2022    CMP  Lab Results  Component Value Date   NA 139 03/23/2022   K 4.4 03/23/2022   CL 102 03/23/2022   CO2 30 03/23/2022   GLUCOSE 118 (H) 03/23/2022   BUN 15 03/23/2022   CREATININE 0.78 03/23/2022   CALCIUM 9.6 03/23/2022   PROT 7.3 03/23/2022   ALBUMIN 4.5 03/23/2022   AST 31 03/23/2022   ALT 27 03/23/2022   ALKPHOS 50 03/23/2022   BILITOT 0.7 03/23/2022   GFRNONAA >60 03/23/2022   GFRAA >60 01/21/2020      Medications: I have reviewed the patient's current medications.   Assessment/Plan: 1.AL Amyloidosis, lambda light chain, diagnosed in December 2013-most recently treated with every 2 week Velcade/Decadron, last given 03/19/2014   Status post autologous stem cell collection at [facility] 04/06/2014 Urine protein improved and serum light chains stable 05/08/2016 Urine protein stable, light chains stable 07/22/2018 Urine protein stable, light chains stable 01/19/2019 Urine protein stable, light chains stable  07/20/2019 Urine protein stable, light chains stable 09/19/2020 Urine protein stable, light chains stable 03/19/2021 Urine protein stable, light chains stable 09/22/2021 Urine protein stable, light chains stable 03/22/2022 2. 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 Followed by cardiology, echocardiogram September 2015 with an LVEF of 65-70% and severe LVH, October 2022-severe LVH with LVEF 60-65% 4. 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  Zoster rash December 2016 6.  Memory loss 7.  COVID-19 infection July 2022      Disposition: Eugene Martinez appears stable.  There is no clinical evidence for progression of the amyloidosis.  He is scheduled to see cardiology in 2 weeks.  We will follow-up on the 24-hour urine protein electrophoresis from today.  [name] for an office and lab visit in 9 months.  Betsy Coder, MD  09/20/2022  11:40 AM

## 2022-09-21 LAB — KAPPA/LAMBDA LIGHT CHAINS
Kappa free light chain: 22.1 mg/L — ABNORMAL HIGH (ref 3.3–19.4)
Kappa, lambda light chain ratio: 1.08 (ref 0.26–1.65)
Lambda free light chains: 20.5 mg/L (ref 5.7–26.3)

## 2022-09-25 LAB — UIFE/LIGHT CHAINS/TP QN, 24-HR UR
FR KAPPA LT CH,24HR: 3.43 mg/24 hr
FR LAMBDA LT CH,24HR: <1.79 mg/24 hr
Free Kappa Lt Chains,Ur: 1.32 mg/L (ref 1.17–86.46)
Free Kappa/Lambda Ratio: >1.91 (ref 1.83–14.26)
Free Lambda Lt Chains,Ur: <0.69 mg/L (ref 0.27–15.21)
Total Protein, Urine-Ur/day: <104 mg/24 hr (ref 30–150)
Total Protein, Urine: <4 mg/dL
Total Volume: 2600

## 2022-09-26 ENCOUNTER — Telehealth: Payer: Self-pay

## 2022-09-26 NOTE — Telephone Encounter (Signed)
-----   Message from Ladell Pier, MD sent at 09/26/2022  6:47 AM EDT ----- Please call patient, urine protein is normal, no evidence for progressive myeloma, f/u as scheduled

## 2022-09-26 NOTE — Telephone Encounter (Signed)
Patient gave verbal understanding had no further questions or concerns. 

## 2022-09-28 DIAGNOSIS — D696 Thrombocytopenia, unspecified: Secondary | ICD-10-CM | POA: Diagnosis not present

## 2022-09-28 DIAGNOSIS — G14 Postpolio syndrome: Secondary | ICD-10-CM | POA: Diagnosis not present

## 2022-09-28 DIAGNOSIS — D649 Anemia, unspecified: Secondary | ICD-10-CM | POA: Diagnosis not present

## 2022-09-28 DIAGNOSIS — I10 Essential (primary) hypertension: Secondary | ICD-10-CM | POA: Diagnosis not present

## 2022-09-28 DIAGNOSIS — R195 Other fecal abnormalities: Secondary | ICD-10-CM | POA: Diagnosis not present

## 2022-09-28 DIAGNOSIS — R143 Flatulence: Secondary | ICD-10-CM | POA: Diagnosis not present

## 2022-09-28 DIAGNOSIS — R7309 Other abnormal glucose: Secondary | ICD-10-CM | POA: Diagnosis not present

## 2022-09-28 DIAGNOSIS — E8589 Other amyloidosis: Secondary | ICD-10-CM | POA: Diagnosis not present

## 2022-09-28 DIAGNOSIS — Z0000 Encounter for general adult medical examination without abnormal findings: Secondary | ICD-10-CM | POA: Diagnosis not present

## 2022-10-02 NOTE — Progress Notes (Unsigned)
Cardiology Office Note  Date:  10/03/2022   ID:  Eugene Martinez., DOB November 26, 1946, MRN [id number]  PCP:  Tracie Harrier, MD   Chief Complaint  Patient presents with   12 month follow up     "Doing well." Medications reviewed by the patient's medication list.     HPI:  76 year old gentleman with history of  Polio, uses a brace right lower extremity AL Amyloidosis, diagnosis 2013, previous chemo,  remission since 11/2013, renal biopsy established the diagnosis of AL amyloidosis. treated with every 2 week Velcade/Decadron, last given 03/19/2014  Followed by oncology, Dr. Benay Spice  severe LVH on echo and MRI,  exertional syncope Post-polio, brace right LE who presents for routine follow-up of his severe LVH, amyloid disease  Last seen by myself in clinic October 2022  Feels well, denies SOB, no chest pain Denies any chest pain on hills and stairs Walks with a brace on the right leg, prior history of polio No near-syncope or syncope No lower extremity edema  Exercises every morning For 30 min, 6 x a week  Brother with alzheimers, currently in the hospital  Lab work reviewed A1C 5.7 Total chol 148, LDL 82  EKG personally reviewed by myself on todays visit Normal sinus rhythm rate 70 bpm right bundle branch block, left anterior fascicular block, unchanged from prior EKGs  Past medical history reviewed Follwed by Dr. Benay Spice, for AL amyloidosis Seen a few weeks ago Urine collection ordered, labs still pending   Aricept for memory loss.   Prior history of falls  Echocardiogram October 2022  1. Left ventricular ejection fraction, by estimation, is 60 to 65%. The  left ventricle has normal function. The left ventricle has no regional  wall motion abnormalities. There is severe left ventricular hypertrophy.  Left ventricular diastolic parameters   are consistent with Grade I diastolic dysfunction (impaired relaxation).  The average left ventricular global  longitudinal strain is -16.1 %. The  global longitudinal strain is normal.   2. Right ventricular systolic function is normal. The right ventricular  size is normal.   3. The mitral valve is normal in structure. Mild mitral valve  regurgitation. No evidence of mitral stenosis. Moderate mitral annular  calcification.   4. The aortic valve is normal in structure. Aortic valve regurgitation is  not visualized. No aortic stenosis is present.   5. The inferior vena cava is normal in size with greater than 50%  respiratory variability, suggesting right atrial pressure of 3 mmHg.    September 2015 hospitalized at [facility] hospital after experiencing exertional syncope while walking up steps    echocardiogram with  significant left ventricular hypertrophy and a speckling of the myocardium   Systolic function was normal with ejection fraction of 55-60% and the left atrium was moderately dilated.  cardiac MRI 2013 Severe LVH, Left ventricular wall motion was normal with an ejection fraction of 66%. mild right ventricular hypertrophy and normal right atrial size and mild to moderate mitral regurgitation.  urine immunoelectrophoresis   light chain Bence-Jones protein in the urine.  fat pad biopsy which was not diagnostic for myeloma or amyloidosis and a bone marrow biopsy which was not diagnostic.  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  completed chemotherapy for his amyloidosis and has been in remission since December 2014.  He has had previous harvest of stem cells down at [facility] but has not had a stem cell transplant.  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  PSH:    Past Surgical History:  Procedure Laterality Date   CIRCUMCISION  1984   COLONOSCOPY  03/2012   COLONOSCOPY WITH PROPOFOL N/A 10/29/2017   Procedure: COLONOSCOPY WITH PROPOFOL;  Surgeon: Lollie Sails, MD;  Location: [facility] ENDOSCOPY;  Service: Endoscopy;  Laterality: N/A;   FOOT SURGERY     right foot   FOOT SURGERY Right 2017   LEG SURGERIES     HX.OF MULTIPLE SURGERIES FOR LEG LENGTH DUE TO POLIO   LUMBAR LAMINECTOMY/DECOMPRESSION MICRODISCECTOMY N/A 01/26/2019   Procedure: LUMBAR LAMINECTOMY/DECOMPRESSION MICRODISCECTOMY 1 LEVEL L3-4;  Surgeon: Deetta Perla, MD;  Location: [facility] ORS;  Service: Neurosurgery;  Laterality: N/A;   NECK SURGERY     PILONIDAL CYST / SINUS EXCISION      Current Outpatient Medications  Medication Sig Dispense Refill   acetaminophen (TYLENOL) 500 MG tablet Take 1,000 mg by mouth every 6 (six) hours as needed.      aspirin 81 MG chewable tablet Chew 81 mg by mouth daily.     azelastine (ASTELIN) 137 MCG/SPRAY nasal spray Place 1-2 sprays into both nostrils every 12 (twelve) hours as needed (allergies).     colestipol (COLESTID) 1 g tablet Take 2 g by mouth 2 (two) times daily.     donepezil (ARICEPT) 10 MG tablet Take 10 mg by mouth at bedtime.     escitalopram (LEXAPRO) 5 MG tablet Take 5 mg by mouth daily.     ferrous sulfate 325 (65 FE) MG tablet Take 325 mg by mouth daily.     finasteride (PROSCAR) 5 MG tablet TAKE 1 TABLET BY MOUTH EVERY DAY     memantine (NAMENDA) 5 MG tablet Take 5 mg by mouth 2 (two) times daily.     metoprolol succinate  (TOPROL-XL) 25 MG 24 hr tablet Take 0.5 tablets (12.5 mg total) by mouth daily. Take with or immediately following a meal. 45 tablet 3   Multiple Vitamins-Minerals (AIRBORNE PO) Take 1 tablet by mouth daily.     Multiple Vitamins-Minerals (CENTRUM SILVER ULTRA MENS) TABS Take 1 tablet by mouth every morning.     mupirocin ointment (BACTROBAN) 2 % Apply topically as needed.      Omega-3 Fatty Acids (FISH OIL) 1200 MG CAPS Take 1,200 mg by mouth 2 (two) times daily.     omeprazole (PRILOSEC) 20 MG capsule Take 20 mg by mouth every morning.     triamcinolone cream (KENALOG) 0.1 % Apply 1 application topically daily as needed (skin rash).   2   vitamin E 400 UNIT capsule Take 400 Units by mouth every morning.      No current facility-administered medications for this visit.     Allergies:   Other   Social History:  The patient  reports that he has never smoked. He has never used smokeless tobacco. He reports that he does not drink alcohol and does not use drugs.   Family History:   family history  includes Heart attack in his father.    Review of Systems: Review of Systems  Constitutional: Negative.   HENT: Negative.    Respiratory: Negative.    Cardiovascular: Negative.   Gastrointestinal: Negative.   Musculoskeletal:  Positive for joint pain.       Leg weakness  Neurological: Negative.   Psychiatric/Behavioral: Negative.    All other systems reviewed and are negative.   PHYSICAL EXAM: VS:  BP 122/68 (BP Location: Left Arm, Patient Position: Sitting, Cuff Size: Normal)   Pulse 70   Ht '5\' 6"'  (1.676 m)   Wt 164 lb (74.4 kg)   SpO2 98%   BMI 26.47 kg/m  , BMI Body mass index is 26.47 kg/m. Constitutional:  oriented to person, place, and time. No distress.  HENT:  Head: Grossly normal Eyes:  no discharge. No scleral icterus.  Neck: No JVD, no carotid bruits  Cardiovascular: Regular rate and rhythm, no murmurs appreciated Pulmonary/Chest: Clear to auscultation bilaterally, no  wheezes or rails Abdominal: Soft.  no distension.  no tenderness.  Musculoskeletal: Normal range of motion Neurological:  normal muscle tone. Coordination normal. No atrophy Skin: Skin warm and dry Psychiatric: normal affect, pleasant   Recent Labs: 09/20/2022: ALT 23; BUN 17; Creatinine 0.82; Hemoglobin 13.2; Platelet Count 130; Potassium 4.1; Sodium 135    Lipid Panel Lab Results  Component Value Date   CHOL 209 (H) 10/31/2012   HDL 37 (L) 10/31/2012   LDLCALC 148 (H) 10/31/2012   TRIG 118 10/31/2012    Wt Readings from Last 3 Encounters:  10/03/22 164 lb (74.4 kg)  09/20/22 158 lb 12.8 oz (72 kg)  03/23/22 156 lb 12.8 oz (71.1 kg)     ASSESSMENT AND PLAN:  Essential hypertension - Plan: EKG 12-Lead Blood pressure is well controlled on today's visit. No changes made to the medications.  Syncope, unspecified syncope type Denies any near-syncope or syncope   severe LVH anatomy  on echocardiogram Started on metoprolol succinate 12.5 daily Discussed other medication options if he develops symptoms of outflow tract obstruction such as camzyos  AL amyloidosis ([place]) Previous chemotherapy, stable since 2014 Followed by oncology Recent echocardiogram 2022 unchanged  LVH (left ventricular hypertrophy)  severe LVH on echocardiogram and MRI Long discussion concerning various treatment options, low-dose metoprolol initiated  Hyperlipidemia not on a statin Cholesterol very reasonable   Total encounter time more than 30 minutes  Greater than 50% was spent in counseling and coordination of care with the patient   Orders Placed This Encounter  Procedures   EKG 12-Lead      Signed, Esmond Plants, M.D., Ph.D. 10/03/2022  [place], [place]

## 2022-10-03 ENCOUNTER — Ambulatory Visit: Payer: PPO | Attending: Cardiovascular Disease | Admitting: Cardiovascular Disease

## 2022-10-03 ENCOUNTER — Encounter: Payer: Self-pay | Admitting: Cardiovascular Disease

## 2022-10-03 VITALS — BP 122/68 | HR 70 | Ht 66.0 in | Wt 164.0 lb

## 2022-10-03 DIAGNOSIS — I5032 Chronic diastolic (congestive) heart failure: Secondary | ICD-10-CM | POA: Diagnosis not present

## 2022-10-03 DIAGNOSIS — E859 Amyloidosis, unspecified: Secondary | ICD-10-CM | POA: Diagnosis not present

## 2022-10-03 DIAGNOSIS — R55 Syncope and collapse: Secondary | ICD-10-CM

## 2022-10-03 DIAGNOSIS — I10 Essential (primary) hypertension: Secondary | ICD-10-CM | POA: Diagnosis not present

## 2022-10-03 DIAGNOSIS — E8581 Light chain (AL) amyloidosis: Secondary | ICD-10-CM | POA: Diagnosis not present

## 2022-10-03 DIAGNOSIS — I517 Cardiomegaly: Secondary | ICD-10-CM

## 2022-10-03 MED ORDER — METOPROLOL SUCCINATE ER 25 MG PO TB24: 12.5000 mg | ORAL_TABLET | Freq: Every day | ORAL | 3 refills | Status: DC

## 2022-10-03 NOTE — Patient Instructions (Addendum)
Read about camzyos   Medication Instructions:  Please start metoprolol succinate 12.5 mg daily  If you need a refill on your cardiac medications before your next appointment, please call your pharmacy.   Lab work: No new labs needed  Testing/Procedures: No new testing needed  Follow-Up: At [facility], you and your health needs are our priority.  As part of our continuing mission to provide you with exceptional heart care, we have created designated Provider Care Teams.  These Care Teams include your primary Cardiologist (physician) and Advanced Practice Providers (APPs -  Physician Assistants and Nurse Practitioners) who all work together to provide you with the care you need, when you need it.  You will need a follow up appointment in 12 months  Providers on your designated Care Team:   Murray Hodgkins, NP Christell Faith, PA-C Cadence Kathlen Mody, [place]  COVID-19 Vaccine Information can be found at: [email or web address] For questions related to vaccine distribution or appointments, please email [email or web address] or call [phone].    Mavacamten Capsules What is this medication? MAVACAMTEN (mav a KAM ten) treats cardiomyopathy. It works by relaxing the muscles in your heart, which makes it easier to pump blood to the rest of your body. This helps you breathe easier and be more active. This medicine may be used for other purposes; ask your health care provider or pharmacist if you have questions. COMMON BRAND NAME(S): CAMZYOS What should I tell my care team before I take this medication? They need to know if you have any of these conditions: Infection Irregular heartbeat or rhythm An unusual or allergic reaction to [facility], other medications, food, dyes or preservatives Pregnant or trying to get pregnant Breast-feeding How should I use this medication? Take this medication by mouth with water. Take it as directed on  the prescription label at the same time every day. Do not cut, crush, or chew this medication. Swallow the capsules whole. You can take it with or without food. If it upsets your stomach, take it with food. Keep taking it unless your care team tells you to stop. A special MedGuide will be given to you by the pharmacist with each prescription and refill. Be sure to read this information carefully each time. Talk to your care team about the use of this medication in children. Special care may be needed. Overdosage: If you think you have taken too much of this medicine contact a poison control center or emergency room at once. NOTE: This medicine is only for you. Do not share this medicine with others. What if I miss a dose? If you miss a dose, take it as soon as you can. If it is almost time for your next dose, take only that dose. Do not take double or extra doses. What may interact with this medication? Do not take this medication with any of the following: Adagrasib Apalutamide Armodafinil Bexarotene Bosentan Cenobamate Ceritinib Certain antibiotics, such as chloramphenicol, clarithromycin, isoniazid, nafcillin, rifabutin, rifampin, rifapentine Certain antivirals for HIV or hepatitis Certain barbiturates, such as amobarbital, butabarbital, butalbital, methohexital, pentobarbital, phenobarbital, secobarbital Certain medications for fungal infections, such as fluconazole, itraconazole, ketoconazole, levoketoconazole, posaconazole, voriconazole Certain medications for seizures, such as carbamazepine, eslicarbazepine, fosphenytoin, phenytoin, primidone Cimetidine Dabrafenib Dexamethasone Elagolix Enzalutamide Esomeprazole Fedratinib Felbamate Fexinidazole Fluoxetine Fluvoxamine Idelalisib Lonafarnib Lorlatinib Lumacaftor; ivacaftor Mifepristone Mitotane Modafinil Nefazodone Pexidartinib Ranolazine Ribociclib Sotorasib St. John's Wort Ticlopidine Tucatinib This medication  may also interact with the following: Certain medications for blood pressure, heart disease, irregular heart beat Estrogen or progestin hormones  Omeprazole Tolbutamide This list may not describe all possible interactions. Give your health care provider a list of all the medicines, herbs, non-prescription drugs, or dietary supplements you use. Also tell them if you smoke, drink alcohol, or use illegal drugs. Some items may interact with your medicine. What should I watch for while using this medication? Visit your care team for regular checks on your progress. Tell your care team if your symptoms do not start to get better or if they get worse. Do not become pregnant while taking this medication or for 4 months after stopping it. Women must use a non-hormonal form of birth control while taking this medication. Women should inform their care team if they wish to become pregnant or think they might be pregnant. There is potential for serious harm to an unborn child. Talk to your care team for more information. Birth control may not work properly while you are taking this medication. Talk to your care team about using an extra method of birth control. What side effects may I notice from receiving this medication? Side effects that you should report to your care team as soon as possible: Allergic reactions--skin rash, itching, hives, swelling of the face, lips, tongue, or throat Heart failure--shortness of breath, swelling of the ankles, feet, or hands, sudden weight gain, unusual weakness or fatigue Side effects that usually do not require medical attention (report these to your care team if they continue or are bothersome): Dizziness Feeling faint or lightheaded This list may not describe all possible side effects. Call your doctor for medical advice about side effects. You may report side effects to FDA at 1-800-FDA-1088. Where should I keep my medication? Keep out of the reach of children and  pets. Store at room temperature between 20 and 25 degrees C (68 and 77 degrees F). Get rid of any unused medication after the expiration date. To get rid of medications that are no longer needed or have expired: Take the medication to a medication take-back program. Check with your pharmacy or law enforcement to find a location. If you cannot return the medication, check the label or package insert to see if the medication should be thrown out in the garbage or flushed down the toilet. If you are not sure, ask your care team. If it is safe to put it in the trash, take the medication out of the container. Mix the medication with cat litter, dirt, coffee grounds, or other unwanted substance. Seal the mixture in a bag or container. Put it in the trash. NOTE: This sheet is a summary. It may not cover all possible information. If you have questions about this medicine, talk to your doctor, pharmacist, or health care provider.  2023 Elsevier/Gold Standard (2021-04-16 00:00:00)    Metoprolol Extended-Release Tablets What is this medication? METOPROLOL (me TOE proe lole) treats high blood pressure and heart failure. It may also be used to prevent chest pain (angina). It works by lowering your blood pressure and heart rate, making it easier for your heart to pump blood to the rest of your body. It belongs to a group of medications called beta blockers. This medicine may be used for other purposes; ask your health care provider or pharmacist if you have questions. COMMON BRAND NAME(S): toprol, Toprol XL What should I tell my care team before I take this medication? They need to know if you have any of these conditions: Diabetes Heart or vessel disease, such as slow heartbeat, worsening heart failure, heart block,  sick sinus syndrome, or Raynaud syndrome Kidney disease Liver disease Lung or breathing disease, such as asthma or emphysema Pheochromocytoma Thyroid disease An unusual or allergic reaction  to metoprolol, other medications, foods, dyes, or preservatives Pregnant or trying to get pregnant Breastfeeding How should I use this medication? Take this medication by mouth. Take it as directed on the prescription label at the same time every day. Take it with food. You may cut the tablet in half if it is scored (has a line in the middle of it). This may help you swallow the tablet if the whole tablet is too big. Be sure to take both halves. Do not take just one-half of the tablet. Keep taking it unless your care team tells you to stop. Talk to your care team about the use of this medication in children. While it may be prescribed for children as young as 6 years for selected conditions, precautions do apply. Overdosage: If you think you have taken too much of this medicine contact a poison control center or emergency room at once. NOTE: This medicine is only for you. Do not share this medicine with others. What if I miss a dose? If you miss a dose, take it as soon as you can. If it is almost time for your next dose, take only that dose. Do not take double or extra doses. What may interact with this medication? This medication may interact with the following: Certain medications for blood pressure, heart disease, irregular heartbeat Certain medications for depression, like monoamine oxidase (MAO) inhibitors, fluoxetine, or paroxetine Clonidine Dobutamine Epinephrine Isoproterenol Reserpine This list may not describe all possible interactions. Give your health care provider a list of all the medicines, herbs, non-prescription drugs, or dietary supplements you use. Also tell them if you smoke, drink alcohol, or use illegal drugs. Some items may interact with your medicine. What should I watch for while using this medication? Visit your care team for regular checks on your progress. Check your blood pressure as directed. Know what your blood pressure should be and when to contact your care  team. Do not treat yourself for coughs, colds, or pain while you are using this medication without asking your care team for advice. Some medications may increase your blood pressure. This medication may affect your coordination, reaction time, or judgment. Do not drive or operate machinery until you know how this medication affects you. Sit up or stand slowly to reduce the risk of dizzy or fainting spells. Drinking alcohol with this medication can increase the risk of these side effects. This medication may increase blood sugar. Ask your care team if changes in diet or medications are needed if you have diabetes. What side effects may I notice from receiving this medication? Side effects that you should report to your care team as soon as possible: Allergic reactions--skin rash, itching, hives, swelling of the face, lips, tongue, or throat Heart failure--shortness of breath, swelling of the ankles, feet, or hands, sudden weight gain, unusual weakness or fatigue Low blood pressure--dizziness, feeling faint or lightheaded, blurry vision Raynaud's--cool, numb, or painful fingers or toes that may change color from pale, to blue, to red Slow heartbeat--dizziness, feeling faint or lightheaded, confusion, trouble breathing, unusual weakness or fatigue Worsening mood, feelings of depression Side effects that usually do not require medical attention (report to your care team if they continue or are bothersome): Change in sex drive or performance Diarrhea Dizziness Fatigue Headache This list may not describe all possible side effects.  Call your doctor for medical advice about side effects. You may report side effects to FDA at 1-800-FDA-1088. Where should I keep my medication? Keep out of the reach of children and pets. Store at room temperature between 20 and 25 degrees C (68 and 77 degrees F). Throw away any unused medication after the expiration date. NOTE: This sheet is a summary. It may not cover  all possible information. If you have questions about this medicine, talk to your doctor, pharmacist, or health care provider.  2023 Elsevier/Gold Standard (2008-01-17 00:00:00)

## 2022-10-05 DIAGNOSIS — E859 Amyloidosis, unspecified: Secondary | ICD-10-CM | POA: Diagnosis not present

## 2022-10-05 DIAGNOSIS — D649 Anemia, unspecified: Secondary | ICD-10-CM | POA: Diagnosis not present

## 2022-10-05 DIAGNOSIS — R413 Other amnesia: Secondary | ICD-10-CM | POA: Diagnosis not present

## 2022-10-05 DIAGNOSIS — G14 Postpolio syndrome: Secondary | ICD-10-CM | POA: Diagnosis not present

## 2022-10-05 DIAGNOSIS — R29898 Other symptoms and signs involving the musculoskeletal system: Secondary | ICD-10-CM | POA: Diagnosis not present

## 2022-10-05 DIAGNOSIS — J4520 Mild intermittent asthma, uncomplicated: Secondary | ICD-10-CM | POA: Diagnosis not present

## 2022-10-05 DIAGNOSIS — R7309 Other abnormal glucose: Secondary | ICD-10-CM | POA: Diagnosis not present

## 2022-10-19 DIAGNOSIS — G3184 Mild cognitive impairment, so stated: Secondary | ICD-10-CM | POA: Diagnosis not present

## 2022-10-24 ENCOUNTER — Encounter: Payer: Self-pay | Admitting: Podiatry

## 2022-10-24 ENCOUNTER — Ambulatory Visit: Payer: PPO | Admitting: Podiatry

## 2022-10-24 DIAGNOSIS — M79676 Pain in unspecified toe(s): Secondary | ICD-10-CM | POA: Diagnosis not present

## 2022-10-24 DIAGNOSIS — D2371 Other benign neoplasm of skin of right lower limb, including hip: Secondary | ICD-10-CM | POA: Diagnosis not present

## 2022-10-24 DIAGNOSIS — B351 Tinea unguium: Secondary | ICD-10-CM

## 2022-10-24 NOTE — Progress Notes (Signed)
He presents today chief complaint of painful calluses and toenails.  Objective: Pulses are palpable.  Neurologic sensorium is intact Deetjen reflexes are intact muscle strength is normal symmetrical left over right.  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  Assessment: Pain in limb secondary to onychomycosis.  Plan: Debridement of onychomycotic nails 1 through 5 bilateral.

## 2022-11-28 ENCOUNTER — Ambulatory Visit: Payer: PPO | Admitting: Dermatology

## 2022-11-28 VITALS — BP 130/70 | HR 90

## 2022-11-28 DIAGNOSIS — L578 Other skin changes due to chronic exposure to nonionizing radiation: Secondary | ICD-10-CM

## 2022-11-28 DIAGNOSIS — L821 Other seborrheic keratosis: Secondary | ICD-10-CM

## 2022-11-28 DIAGNOSIS — Z1283 Encounter for screening for malignant neoplasm of skin: Secondary | ICD-10-CM

## 2022-11-28 DIAGNOSIS — L905 Scar conditions and fibrosis of skin: Secondary | ICD-10-CM | POA: Diagnosis not present

## 2022-11-28 DIAGNOSIS — L814 Other melanin hyperpigmentation: Secondary | ICD-10-CM | POA: Diagnosis not present

## 2022-11-28 DIAGNOSIS — D229 Melanocytic nevi, unspecified: Secondary | ICD-10-CM | POA: Diagnosis not present

## 2022-11-28 DIAGNOSIS — D2271 Melanocytic nevi of right lower limb, including hip: Secondary | ICD-10-CM

## 2022-11-28 DIAGNOSIS — Z86018 Personal history of other benign neoplasm: Secondary | ICD-10-CM | POA: Diagnosis not present

## 2022-11-28 DIAGNOSIS — D492 Neoplasm of unspecified behavior of bone, soft tissue, and skin: Secondary | ICD-10-CM

## 2022-11-28 NOTE — Patient Instructions (Addendum)
Wound Care Instructions  Cleanse wound gently with soap and water once a day then pat dry with clean gauze. Apply a thin coat of Petrolatum (petroleum jelly, "Vaseline") over the wound (unless you have an allergy to this). We recommend that you use a new, sterile tube of Vaseline. Do not pick or remove scabs. Do not remove the yellow or white "healing tissue" from the base of the wound.  Cover the wound with fresh, clean, nonstick gauze and secure with paper tape. You may use Band-Aids in place of gauze and tape if the wound is small enough, but would recommend trimming much of the tape off as there is often too much. Sometimes Band-Aids can irritate the skin.  You should call the office for your biopsy report after 1 week if you have not already been contacted.  If you experience any problems, such as abnormal amounts of bleeding, swelling, significant bruising, significant pain, or evidence of infection, please call the office immediately.  FOR ADULT SURGERY PATIENTS: If you need something for pain relief you may take 1 extra strength Tylenol (acetaminophen) AND 2 Ibuprofen (200mg each) together every 4 hours as needed for pain. (do not take these if you are allergic to them or if you have a reason you should not take them.) Typically, you may only need pain medication for 1 to 3 days.     Due to recent changes in healthcare laws, you may see results of your pathology and/or laboratory studies on MyChart before the doctors have had a chance to review them. We understand that in some cases there may be results that are confusing or concerning to you. Please understand that not all results are received at the same time and often the doctors may need to interpret multiple results in order to provide you with the best plan of care or course of treatment. Therefore, we ask that you please give us 2 business days to thoroughly review all your results before contacting the office for clarification. Should  we see a critical lab result, you will be contacted sooner.   If You Need Anything After Your Visit  If you have any questions or concerns for your doctor, please call our main line at [phone] and press option 4 to reach your doctor's medical assistant. If no one answers, please leave a voicemail as directed and we will return your call as soon as possible. Messages left after 4 pm will be answered the following business day.   You may also send us a message via MyChart. We typically respond to MyChart messages within 1-2 business days.  For prescription refills, please ask your pharmacy to contact our office. Our fax number is [phone].  If you have an urgent issue when the clinic is closed that cannot wait until the next business day, you can page your doctor at the number below.    Please note that while we do our best to be available for urgent issues outside of office hours, we are not available 24/7.   If you have an urgent issue and are unable to reach us, you may choose to seek medical care at your doctor's office, retail clinic, urgent care center, or emergency room.  If you have a medical emergency, please immediately call 911 or go to the emergency department.  Pager Numbers  - Dr. Kowalski: [phone]  - Dr. Moye: [phone]  - Dr. Stewart: [phone]  In the event of inclement weather, please call our main line at   [phone] for an update on the status of any delays or closures.  Dermatology Medication Tips: Please keep the boxes that topical medications come in in order to help keep track of the instructions about where and how to use these. Pharmacies typically print the medication instructions only on the boxes and not directly on the medication tubes.   If your medication is too expensive, please contact our office at [phone] option 4 or send us a message through MyChart.   We are unable to tell what your co-pay for medications will be in  advance as this is different depending on your insurance coverage. However, we may be able to find a substitute medication at lower cost or fill out paperwork to get insurance to cover a needed medication.   If a prior authorization is required to get your medication covered by your insurance company, please allow us 1-2 business days to complete this process.  Drug prices often vary depending on where the prescription is filled and some pharmacies may offer cheaper prices.  The website www.goodrx.com contains coupons for medications through different pharmacies. The prices here do not account for what the cost may be with help from insurance (it may be cheaper with your insurance), but the website can give you the price if you did not use any insurance.  - You can print the associated coupon and take it with your prescription to the pharmacy.  - You may also stop by our office during regular business hours and pick up a GoodRx coupon card.  - If you need your prescription sent electronically to a different pharmacy, notify our office through [place] MyChart or by phone at [phone] option 4.     Si Usted Necesita Algo Despus de Su Visita  Tambin puede enviarnos un mensaje a travs de MyChart. Por lo general respondemos a los mensajes de MyChart en el transcurso de 1 a 2 das hbiles.  Para renovar recetas, por favor pida a su farmacia que se ponga en contacto con nuestra oficina. Nuestro nmero de fax es el [phone].  Si tiene un asunto urgente cuando la clnica est cerrada y que no puede esperar hasta el siguiente da hbil, puede llamar/localizar a su doctor(a) al nmero que aparece a continuacin.   Por favor, tenga en cuenta que aunque hacemos todo lo posible para estar disponibles para asuntos urgentes fuera del horario de oficina, no estamos disponibles las 24 horas del da, los 7 das de la semana.   Si tiene un problema urgente y no puede comunicarse con nosotros, puede  optar por buscar atencin mdica  en el consultorio de su doctor(a), en una clnica privada, en un centro de atencin urgente o en una sala de emergencias.  Si tiene una emergencia mdica, por favor llame inmediatamente al 911 o vaya a la sala de emergencias.  Nmeros de bper  - Dr. Kowalski: [phone]  - Dra. Moye: [phone]  - Dra. Stewart: [phone]  En caso de inclemencias del tiempo, por favor llame a nuestra lnea principal al [phone] para una actualizacin sobre el estado de cualquier retraso o cierre.  Consejos para la medicacin en dermatologa: Por favor, guarde las cajas en las que vienen los medicamentos de uso tpico para ayudarle a seguir las instrucciones sobre dnde y cmo usarlos. Las farmacias generalmente imprimen las instrucciones del medicamento slo en las cajas y no directamente en los tubos del medicamento.   Si su medicamento es muy caro, por favor, pngase en contacto con   nuestra oficina llamando al [phone] y presione la opcin 4 o envenos un mensaje a travs de MyChart.   No podemos decirle cul ser su copago por los medicamentos por adelantado ya que esto es diferente dependiendo de la cobertura de su seguro. Sin embargo, es posible que podamos encontrar un medicamento sustituto a menor costo o llenar un formulario para que el seguro cubra el medicamento que se considera necesario.   Si se requiere una autorizacin previa para que su compaa de seguros cubra su medicamento, por favor permtanos de 1 a 2 das hbiles para completar este proceso.  Los precios de los medicamentos varan con frecuencia dependiendo del lugar de dnde se surte la receta y alguna farmacias pueden ofrecer precios ms baratos.  El sitio web www.goodrx.com tiene cupones para medicamentos de diferentes farmacias. Los precios aqu no tienen en cuenta lo que podra costar con la ayuda del seguro (puede ser ms barato con su seguro), pero el sitio web puede darle el  precio si no utiliz ningn seguro.  - Puede imprimir el cupn correspondiente y llevarlo con su receta a la farmacia.  - Tambin puede pasar por nuestra oficina durante el horario de atencin regular y recoger una tarjeta de cupones de GoodRx.  - Si necesita que su receta se enve electrnicamente a una farmacia diferente, informe a nuestra oficina a travs de MyChart de [place] o por telfono llamando al [phone] y presione la opcin 4.  

## 2022-11-28 NOTE — Progress Notes (Signed)
Follow-Up Visit   Subjective  Eugene Martinez. is a 76 y.o. male who presents for the following: Annual Exam. Hx of Dysplastic nevus. The patient presents for Total-Body Skin Exam (TBSE) for skin cancer screening and mole check.  The patient has spots, moles and lesions to be evaluated, some may be new or changing and the patient has concerns that these could be cancer.   The following portions of the chart were reviewed this encounter and updated as appropriate:   Tobacco  Allergies  Meds  Problems  Med Hx  Surg Hx  Fam Hx     Review of Systems:  No other skin or systemic complaints except as noted in HPI or Assessment and Plan.  Objective  Well appearing patient in no apparent distress; mood and affect are within normal limits.  A full examination was performed including scalp, head, eyes, ears, nose, lips, neck, chest, axillae, abdomen, back, buttocks, bilateral upper extremities, bilateral lower extremities, hands, feet, fingers, toes, fingernails, and toenails. All findings within normal limits unless otherwise noted below.  right prox medial thigh 1.1 cm irregular brown macule         Assessment & Plan  Neoplasm of skin right prox medial thigh  Epidermal / dermal shaving  Lesion diameter (cm):  1.1 Informed consent: discussed and consent obtained   Timeout: patient name, date of birth, surgical site, and procedure verified   Procedure prep:  Patient was prepped and draped in usual sterile fashion Prep type:  Isopropyl alcohol Anesthesia: the lesion was anesthetized in a standard fashion   Anesthetic:  1% lidocaine w/ epinephrine 1-100,000 buffered w/ 8.4% NaHCO3 Hemostasis achieved with: pressure, aluminum chloride and electrodesiccation   Outcome: patient tolerated procedure well   Post-procedure details: sterile dressing applied and wound care instructions given   Dressing type: bandage and petrolatum    Specimen 1 - Surgical pathology Differential  Diagnosis: R/O Dysplastic nevus vs other  Check Margins: No  Lentigines - Scattered tan macules - Due to sun exposure - Benign-appearing, observe - Recommend daily broad spectrum sunscreen SPF 30+ to sun-exposed areas, reapply every 2 hours as needed. - Call for any changes  Seborrheic Keratoses - Stuck-on, waxy, tan-brown papules and/or plaques  - Benign-appearing - Discussed benign etiology and prognosis. - Observe - Call for any changes  Melanocytic Nevi - Tan-brown and/or pink-flesh-colored symmetric macules and papules - Benign appearing on exam today - Observation - Call clinic for new or changing moles - Recommend daily use of broad spectrum spf 30+ sunscreen to sun-exposed areas.   Hemangiomas - Red papules - Discussed benign nature - Observe - Call for any changes  Actinic Damage - Chronic condition, secondary to cumulative UV/sun exposure - diffuse scaly erythematous macules with underlying dyspigmentation - Recommend daily broad spectrum sunscreen SPF 30+ to sun-exposed areas, reapply every 2 hours as needed.  - Staying in the shade or wearing long sleeves, sun glasses (UVA+UVB protection) and wide brim hats (4-inch brim around the entire circumference of the hat) are also recommended for sun protection.  - Call for new or changing lesions.  History of Dysplastic Nevi Multiple see history  - No evidence of recurrence today - Recommend regular full body skin exams - Recommend daily broad spectrum sunscreen SPF 30+ to sun-exposed areas, reapply every 2 hours as needed.  - Call if any new or changing lesions are noted between office visits   Skin cancer screening performed today.   Return in about 1  year (around 11/29/2023) for TBSE, hx of Dysplastic nevus .  IMarye Round, CMA, am acting as scribe for Sarina Ser, MD .  Documentation: I have reviewed the above documentation for accuracy and completeness, and I agree with the above.  Sarina Ser,  MD

## 2022-12-07 ENCOUNTER — Encounter: Payer: Self-pay | Admitting: Dermatology

## 2022-12-17 ENCOUNTER — Telehealth: Payer: Self-pay

## 2022-12-17 NOTE — Telephone Encounter (Signed)
LM on VM please return my call, need to schedule surgery

## 2022-12-17 NOTE — Telephone Encounter (Signed)
Left pt msg to call for bx result/sh °

## 2022-12-17 NOTE — Telephone Encounter (Signed)
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

## 2022-12-18 ENCOUNTER — Telehealth: Payer: Self-pay

## 2022-12-18 NOTE — Telephone Encounter (Signed)
Patient advised and surgery scheduled. aw

## 2022-12-18 NOTE — Telephone Encounter (Signed)
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

## 2022-12-28 ENCOUNTER — Encounter: Payer: Self-pay | Admitting: Podiatry

## 2023-01-08 ENCOUNTER — Ambulatory Visit: Payer: PPO | Admitting: Dermatology

## 2023-01-08 ENCOUNTER — Telehealth: Payer: Self-pay

## 2023-01-08 ENCOUNTER — Encounter: Payer: Self-pay | Admitting: Dermatology

## 2023-01-08 VITALS — BP 114/63 | HR 76

## 2023-01-08 DIAGNOSIS — D2371 Other benign neoplasm of skin of right lower limb, including hip: Secondary | ICD-10-CM

## 2023-01-08 DIAGNOSIS — D239 Other benign neoplasm of skin, unspecified: Secondary | ICD-10-CM

## 2023-01-08 DIAGNOSIS — L988 Other specified disorders of the skin and subcutaneous tissue: Secondary | ICD-10-CM | POA: Diagnosis not present

## 2023-01-08 MED ORDER — MUPIROCIN 2 % EX OINT: 1.0000 | TOPICAL_OINTMENT | Freq: Every day | CUTANEOUS | 0 refills | Status: DC

## 2023-01-08 NOTE — Progress Notes (Signed)
   Follow-Up Visit   Subjective  Eugene Martinez. is a 77 y.o. male who presents for the following: Sever dysplastic nevus bx proven (R prox lat thigh, pt presents for excision). Patient accompanied by wife.  The following portions of the chart were reviewed this encounter and updated as appropriate:   Tobacco  Allergies  Meds  Problems  Med Hx  Surg Hx  Fam Hx     Review of Systems:  No other skin or systemic complaints except as noted in HPI or Assessment and Plan.  Objective  Well appearing patient in no apparent distress; mood and affect are within normal limits.  A focused examination was performed including right leg. Relevant physical exam findings are noted in the Assessment and Plan.  R proximal lat thigh Pink bx site 1.2 x 0.8cm   Assessment & Plan  Dysplastic nevus R proximal lat thigh  Severe, bx proven, excised today Start Mupirocin oint qd to excision site  Skin excision - R proximal lat thigh  Lesion length (cm):  1.2 Lesion width (cm):  0.8 Margin per side (cm):  0.2 Total excision diameter (cm):  1.6 Informed consent: discussed and consent obtained   Timeout: patient name, date of birth, surgical site, and procedure verified   Procedure prep:  Patient was prepped and draped in usual sterile fashion Prep type:  Isopropyl alcohol and povidone-iodine Anesthesia: the lesion was anesthetized in a standard fashion   Anesthetic:  1% lidocaine w/ epinephrine 1-100,000 buffered w/ 8.4% NaHCO3 (7cc lifo w/ epi, 3cc bupivicaine, Total of 10cc) Instrument used: #15 blade   Hemostasis achieved with: pressure   Hemostasis achieved with comment:  Electrocautery Outcome: patient tolerated procedure well with no complications   Post-procedure details: sterile dressing applied and wound care instructions given   Dressing type: bandage, pressure dressing and bacitracin (Mupirocin)    Skin repair - R proximal lat thigh Complexity:  Complex Final length (cm):   3.5 Reason for type of repair: reduce tension to allow closure, reduce the risk of dehiscence, infection, and necrosis, reduce subcutaneous dead space and avoid a hematoma, allow closure of the large defect, preserve normal anatomy, preserve normal anatomical and functional relationships and enhance both functionality and cosmetic results   Undermining: area extensively undermined   Undermining comment:  Undermining Defect 1.8 cm Subcutaneous layers (deep stitches):  Suture size:  2-0 Suture type: Vicryl (polyglactin 910)   Subcutaneous suture technique: Inverted Dermal. Fine/surface layer approximation (top stitches):  Suture size:  2-0 Suture type: nylon   Stitches: simple running   Suture removal (days):  7 Hemostasis achieved with: pressure Outcome: patient tolerated procedure well with no complications   Post-procedure details: sterile dressing applied and wound care instructions given   Dressing type: bandage, pressure dressing and bacitracin (Mupirocin)    mupirocin ointment (BACTROBAN) 2 % - R proximal lat thigh Apply 1 Application topically daily. Qd to excision site  Specimen 1 - Surgical pathology Differential Diagnosis: D48.5 bx proven Severe Dysplastic nevus  Check Margins: yes Pink bx site 1.2 x 0.8cm [id number]  Return in about 1 week (around 01/15/2023) for suture removal.  I, Othelia Pulling, RMA, am acting as scribe for Sarina Ser, MD . Documentation: I have reviewed the above documentation for accuracy and completeness, and I agree with the above.  Sarina Ser, MD

## 2023-01-08 NOTE — Telephone Encounter (Signed)
Left pt message to call if any problems after today's surgery./sh 

## 2023-01-08 NOTE — Patient Instructions (Addendum)

## 2023-01-12 ENCOUNTER — Encounter: Payer: Self-pay | Admitting: Dermatology

## 2023-01-15 ENCOUNTER — Ambulatory Visit (INDEPENDENT_AMBULATORY_CARE_PROVIDER_SITE_OTHER): Payer: PPO | Admitting: Dermatology

## 2023-01-15 DIAGNOSIS — D2371 Other benign neoplasm of skin of right lower limb, including hip: Secondary | ICD-10-CM

## 2023-01-15 DIAGNOSIS — Z4802 Encounter for removal of sutures: Secondary | ICD-10-CM

## 2023-01-15 DIAGNOSIS — D239 Other benign neoplasm of skin, unspecified: Secondary | ICD-10-CM

## 2023-01-15 NOTE — Progress Notes (Unsigned)
   Follow-Up Visit   Subjective  Eugene Martinez. is a 77 y.o. male who presents for the following: Post op/suture removal (Bx proven severely dysplastic nevus of the R prox lat thigh - patient is here today for suture removal).  The following portions of the chart were reviewed this encounter and updated as appropriate:   Tobacco  Allergies  Meds  Problems  Med Hx  Surg Hx  Fam Hx     Review of Systems:  No other skin or systemic complaints except as noted in HPI or Assessment and Plan.  Objective  Well appearing patient in no apparent distress; mood and affect are within normal limits.  A focused examination was performed including the face and legs. Relevant physical exam findings are noted in the Assessment and Plan.  R prox lat thigh Healing excision site.    Assessment & Plan  Severe Dysplastic nevus - S/P excision R prox lat thigh  Encounter for Removal of Sutures - Incision site at the R prox lat thigh is clean, dry and intact - Wound cleansed, sutures removed, wound cleansed and steri strips applied.  - Will contact patient with post surgical pathology results.  - Patient advised to keep steri-strips dry until they fall off. - Scars remodel for a full year. - Once steri-strips fall off, patient can apply over-the-counter silicone scar cream each night to help with scar remodeling if desired. - Patient advised to call with any concerns or if they notice any new or changing lesions.  Related Medications mupirocin ointment (BACTROBAN) 2 % Apply 1 Application topically daily. Qd to excision site  Return for appointment as scheduled.  Luther Redo, CMA, am acting as scribe for Sarina Ser, MD . Documentation: I have reviewed the above documentation for accuracy and completeness, and I agree with the above.  Sarina Ser, MD

## 2023-01-15 NOTE — Patient Instructions (Signed)
Due to recent changes in healthcare laws, you may see results of your pathology and/or laboratory studies on MyChart before the doctors have had a chance to review them. We understand that in some cases there may be results that are confusing or concerning to you. Please understand that not all results are received at the same time and often the doctors may need to interpret multiple results in order to provide you with the best plan of care or course of treatment. Therefore, we ask that you please give us 2 business days to thoroughly review all your results before contacting the office for clarification. Should we see a critical lab result, you will be contacted sooner.   If You Need Anything After Your Visit  If you have any questions or concerns for your doctor, please call our main line at [phone] and press option 4 to reach your doctor's medical assistant. If no one answers, please leave a voicemail as directed and we will return your call as soon as possible. Messages left after 4 pm will be answered the following business day.   You may also send us a message via MyChart. We typically respond to MyChart messages within 1-2 business days.  For prescription refills, please ask your pharmacy to contact our office. Our fax number is [phone].  If you have an urgent issue when the clinic is closed that cannot wait until the next business day, you can page your doctor at the number below.    Please note that while we do our best to be available for urgent issues outside of office hours, we are not available 24/7.   If you have an urgent issue and are unable to reach us, you may choose to seek medical care at your doctor's office, retail clinic, urgent care center, or emergency room.  If you have a medical emergency, please immediately call 911 or go to the emergency department.  Pager Numbers  - Dr. Kowalski: [phone]  - Dr. Moye: [phone]  - Dr. Stewart:  [phone]  In the event of inclement weather, please call our main line at [phone] for an update on the status of any delays or closures.  Dermatology Medication Tips: Please keep the boxes that topical medications come in in order to help keep track of the instructions about where and how to use these. Pharmacies typically print the medication instructions only on the boxes and not directly on the medication tubes.   If your medication is too expensive, please contact our office at [phone] option 4 or send us a message through MyChart.   We are unable to tell what your co-pay for medications will be in advance as this is different depending on your insurance coverage. However, we may be able to find a substitute medication at lower cost or fill out paperwork to get insurance to cover a needed medication.   If a prior authorization is required to get your medication covered by your insurance company, please allow us 1-2 business days to complete this process.  Drug prices often vary depending on where the prescription is filled and some pharmacies may offer cheaper prices.  The website www.goodrx.com contains coupons for medications through different pharmacies. The prices here do not account for what the cost may be with help from insurance (it may be cheaper with your insurance), but the website can give you the price if you did not use any insurance.  - You can print the associated coupon and take it with   your prescription to the pharmacy.  - You may also stop by our office during regular business hours and pick up a GoodRx coupon card.  - If you need your prescription sent electronically to a different pharmacy, notify our office through [facility] MyChart or by phone at [phone] option 4.     Si Usted Necesita Algo Despus de Su Visita  Tambin puede enviarnos un mensaje a travs de MyChart. Por lo general respondemos a los mensajes de MyChart en el transcurso de 1 a 2  das hbiles.  Para renovar recetas, por favor pida a su farmacia que se ponga en contacto con nuestra oficina. Nuestro nmero de fax es el [phone].  Si tiene un asunto urgente cuando la clnica est cerrada y que no puede esperar hasta el siguiente da hbil, puede llamar/localizar a su doctor(a) al nmero que aparece a continuacin.   Por favor, tenga en cuenta que aunque hacemos todo lo posible para estar disponibles para asuntos urgentes fuera del horario de oficina, no estamos disponibles las 24 horas del da, los 7 das de la semana.   Si tiene un problema urgente y no puede comunicarse con nosotros, puede optar por buscar atencin mdica  en el consultorio de su doctor(a), en una clnica privada, en un centro de atencin urgente o en una sala de emergencias.  Si tiene una emergencia mdica, por favor llame inmediatamente al 911 o vaya a la sala de emergencias.  Nmeros de bper  - Dr. Kowalski: [phone]  - Dra. Moye: [phone]  - Dra. Stewart: [phone]  En caso de inclemencias del tiempo, por favor llame a nuestra lnea principal al [phone] para una actualizacin sobre el estado de cualquier retraso o cierre.  Consejos para la medicacin en dermatologa: Por favor, guarde las cajas en las que vienen los medicamentos de uso tpico para ayudarle a seguir las instrucciones sobre dnde y cmo usarlos. Las farmacias generalmente imprimen las instrucciones del medicamento slo en las cajas y no directamente en los tubos del medicamento.   Si su medicamento es muy caro, por favor, pngase en contacto con nuestra oficina llamando al [phone] y presione la opcin 4 o envenos un mensaje a travs de MyChart.   No podemos decirle cul ser su copago por los medicamentos por adelantado ya que esto es diferente dependiendo de la cobertura de su seguro. Sin embargo, es posible que podamos encontrar un medicamento sustituto a menor costo o llenar un formulario para que el  seguro cubra el medicamento que se considera necesario.   Si se requiere una autorizacin previa para que su compaa de seguros cubra su medicamento, por favor permtanos de 1 a 2 das hbiles para completar este proceso.  Los precios de los medicamentos varan con frecuencia dependiendo del lugar de dnde se surte la receta y alguna farmacias pueden ofrecer precios ms baratos.  El sitio web www.goodrx.com tiene cupones para medicamentos de diferentes farmacias. Los precios aqu no tienen en cuenta lo que podra costar con la ayuda del seguro (puede ser ms barato con su seguro), pero el sitio web puede darle el precio si no utiliz ningn seguro.  - Puede imprimir el cupn correspondiente y llevarlo con su receta a la farmacia.  - Tambin puede pasar por nuestra oficina durante el horario de atencin regular y recoger una tarjeta de cupones de GoodRx.  - Si necesita que su receta se enve electrnicamente a una farmacia diferente, informe a nuestra oficina a travs de MyChart de [place]   o por telfono llamando al [phone] y presione la opcin 4.  

## 2023-01-16 ENCOUNTER — Encounter: Payer: Self-pay | Admitting: Dermatology

## 2023-01-16 ENCOUNTER — Telehealth: Payer: Self-pay

## 2023-01-16 NOTE — Telephone Encounter (Signed)
-----   Message from Ralene Bathe, MD sent at 01/15/2023  5:56 PM EST ----- Diagnosis Skin (M), right proximal lat thigh NO RESIDUAL DYSPLASTIC NEVUS, MARGINS FREE  Site of severe dysplastic nevus Margins clear

## 2023-01-16 NOTE — Telephone Encounter (Signed)
Patient informed of pathology results 

## 2023-01-23 ENCOUNTER — Ambulatory Visit: Payer: PPO | Admitting: Podiatry

## 2023-01-29 DIAGNOSIS — G14 Postpolio syndrome: Secondary | ICD-10-CM | POA: Diagnosis not present

## 2023-01-29 DIAGNOSIS — R29898 Other symptoms and signs involving the musculoskeletal system: Secondary | ICD-10-CM | POA: Diagnosis not present

## 2023-01-29 DIAGNOSIS — I10 Essential (primary) hypertension: Secondary | ICD-10-CM | POA: Diagnosis not present

## 2023-01-29 DIAGNOSIS — J4520 Mild intermittent asthma, uncomplicated: Secondary | ICD-10-CM | POA: Diagnosis not present

## 2023-01-29 DIAGNOSIS — R413 Other amnesia: Secondary | ICD-10-CM | POA: Diagnosis not present

## 2023-01-29 DIAGNOSIS — R7309 Other abnormal glucose: Secondary | ICD-10-CM | POA: Diagnosis not present

## 2023-01-29 DIAGNOSIS — D649 Anemia, unspecified: Secondary | ICD-10-CM | POA: Diagnosis not present

## 2023-02-05 DIAGNOSIS — N400 Enlarged prostate without lower urinary tract symptoms: Secondary | ICD-10-CM | POA: Diagnosis not present

## 2023-02-05 DIAGNOSIS — J209 Acute bronchitis, unspecified: Secondary | ICD-10-CM | POA: Diagnosis not present

## 2023-02-05 DIAGNOSIS — Z0000 Encounter for general adult medical examination without abnormal findings: Secondary | ICD-10-CM | POA: Diagnosis not present

## 2023-02-05 DIAGNOSIS — K219 Gastro-esophageal reflux disease without esophagitis: Secondary | ICD-10-CM | POA: Diagnosis not present

## 2023-02-05 DIAGNOSIS — I5032 Chronic diastolic (congestive) heart failure: Secondary | ICD-10-CM | POA: Diagnosis not present

## 2023-02-05 DIAGNOSIS — R7303 Prediabetes: Secondary | ICD-10-CM | POA: Diagnosis not present

## 2023-02-05 DIAGNOSIS — Z6827 Body mass index (BMI) 27.0-27.9, adult: Secondary | ICD-10-CM | POA: Diagnosis not present

## 2023-02-05 DIAGNOSIS — D696 Thrombocytopenia, unspecified: Secondary | ICD-10-CM | POA: Diagnosis not present

## 2023-02-05 DIAGNOSIS — R7309 Other abnormal glucose: Secondary | ICD-10-CM | POA: Diagnosis not present

## 2023-02-05 DIAGNOSIS — E8589 Other amyloidosis: Secondary | ICD-10-CM | POA: Diagnosis not present

## 2023-02-05 DIAGNOSIS — F3340 Major depressive disorder, recurrent, in remission, unspecified: Secondary | ICD-10-CM | POA: Diagnosis not present

## 2023-02-05 DIAGNOSIS — J4520 Mild intermittent asthma, uncomplicated: Secondary | ICD-10-CM | POA: Diagnosis not present

## 2023-02-13 ENCOUNTER — Ambulatory Visit (INDEPENDENT_AMBULATORY_CARE_PROVIDER_SITE_OTHER): Payer: PPO | Admitting: Podiatry

## 2023-02-13 ENCOUNTER — Encounter: Payer: Self-pay | Admitting: Podiatry

## 2023-02-13 DIAGNOSIS — M79676 Pain in unspecified toe(s): Secondary | ICD-10-CM | POA: Diagnosis not present

## 2023-02-13 DIAGNOSIS — B351 Tinea unguium: Secondary | ICD-10-CM

## 2023-02-13 DIAGNOSIS — D2371 Other benign neoplasm of skin of right lower limb, including hip: Secondary | ICD-10-CM | POA: Diagnosis not present

## 2023-02-13 NOTE — Progress Notes (Signed)
He presents today chief complaint of painful elongated toenails and callus to his second toe right foot.  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  Toenails are long thick yellow dystrophic clinically mycotic.  Assessment: Pain in limb secondary to nail dystrophy and onychomycosis as well as painful benign skin lesion.  Plan: Debrided benign skin lesion debrided toenails 1 through 5 bilateral.

## 2023-02-18 DIAGNOSIS — R453 Demoralization and apathy: Secondary | ICD-10-CM | POA: Diagnosis not present

## 2023-02-18 DIAGNOSIS — G3184 Mild cognitive impairment, so stated: Secondary | ICD-10-CM | POA: Diagnosis not present

## 2023-05-15 ENCOUNTER — Encounter: Payer: Self-pay | Admitting: Podiatry

## 2023-05-15 ENCOUNTER — Ambulatory Visit: Payer: PPO | Admitting: Podiatry

## 2023-05-15 DIAGNOSIS — M79676 Pain in unspecified toe(s): Secondary | ICD-10-CM | POA: Diagnosis not present

## 2023-05-15 DIAGNOSIS — B351 Tinea unguium: Secondary | ICD-10-CM | POA: Diagnosis not present

## 2023-05-15 DIAGNOSIS — D2371 Other benign neoplasm of skin of right lower limb, including hip: Secondary | ICD-10-CM | POA: Diagnosis not present

## 2023-05-15 DIAGNOSIS — M7751 Other enthesopathy of right foot: Secondary | ICD-10-CM

## 2023-05-15 NOTE — Progress Notes (Signed)
He presents today chief complaint of painful elongated toenails and painful benign skin lesions bilateral.  Objective: Vital signs are stable alert oriented x 3.  Pulses are palpable.  Nails are long thick yellow dystrophic onychomycotic benign skin lesions plantar aspect of the forefoot bilateral.  Assessment: Pain in limb secondary to onychomycosis and painful benign skin lesions.  Plan: Debridement of lesions debridement of nails.

## 2023-06-04 DIAGNOSIS — I5032 Chronic diastolic (congestive) heart failure: Secondary | ICD-10-CM | POA: Diagnosis not present

## 2023-06-04 DIAGNOSIS — R7309 Other abnormal glucose: Secondary | ICD-10-CM | POA: Diagnosis not present

## 2023-06-04 DIAGNOSIS — N400 Enlarged prostate without lower urinary tract symptoms: Secondary | ICD-10-CM | POA: Diagnosis not present

## 2023-06-04 DIAGNOSIS — Z125 Encounter for screening for malignant neoplasm of prostate: Secondary | ICD-10-CM | POA: Diagnosis not present

## 2023-06-11 DIAGNOSIS — Z0000 Encounter for general adult medical examination without abnormal findings: Secondary | ICD-10-CM | POA: Diagnosis not present

## 2023-06-11 DIAGNOSIS — Z1331 Encounter for screening for depression: Secondary | ICD-10-CM | POA: Diagnosis not present

## 2023-06-11 DIAGNOSIS — J4520 Mild intermittent asthma, uncomplicated: Secondary | ICD-10-CM | POA: Diagnosis not present

## 2023-06-11 DIAGNOSIS — G14 Postpolio syndrome: Secondary | ICD-10-CM | POA: Diagnosis not present

## 2023-06-11 DIAGNOSIS — R29898 Other symptoms and signs involving the musculoskeletal system: Secondary | ICD-10-CM | POA: Diagnosis not present

## 2023-06-11 DIAGNOSIS — R7309 Other abnormal glucose: Secondary | ICD-10-CM | POA: Diagnosis not present

## 2023-06-11 DIAGNOSIS — R413 Other amnesia: Secondary | ICD-10-CM | POA: Diagnosis not present

## 2023-06-11 DIAGNOSIS — R2681 Unsteadiness on feet: Secondary | ICD-10-CM | POA: Diagnosis not present

## 2023-06-20 DIAGNOSIS — D696 Thrombocytopenia, unspecified: Secondary | ICD-10-CM | POA: Diagnosis not present

## 2023-06-20 DIAGNOSIS — E8581 Light chain (AL) amyloidosis: Secondary | ICD-10-CM | POA: Insufficient documentation

## 2023-06-20 DIAGNOSIS — R413 Other amnesia: Secondary | ICD-10-CM | POA: Insufficient documentation

## 2023-06-21 ENCOUNTER — Telehealth: Payer: Self-pay | Admitting: Oncology

## 2023-06-21 ENCOUNTER — Inpatient Hospital Stay: Payer: PPO | Attending: Oncology

## 2023-06-21 ENCOUNTER — Inpatient Hospital Stay: Payer: PPO | Admitting: Oncology

## 2023-06-21 VITALS — BP 131/64 | HR 66 | Temp 97.9°F | Resp 18 | Ht 66.0 in | Wt 163.7 lb

## 2023-06-21 DIAGNOSIS — E8581 Light chain (AL) amyloidosis: Secondary | ICD-10-CM

## 2023-06-21 LAB — CBC WITH DIFFERENTIAL (CANCER CENTER ONLY)
Abs Immature Granulocytes: 0.02 10*3/uL (ref 0.00–0.07)
Basophils Absolute: 0.1 10*3/uL (ref 0.0–0.1)
Basophils Relative: 1 %
Eosinophils Absolute: 0.1 10*3/uL (ref 0.0–0.5)
Eosinophils Relative: 2 %
HCT: 38.5 % — ABNORMAL LOW (ref 39.0–52.0)
Hemoglobin: 13.6 g/dL (ref 13.0–17.0)
Immature Granulocytes: 0 %
Lymphocytes Relative: 20 %
Lymphs Abs: 1.3 10*3/uL (ref 0.7–4.0)
MCH: 32.2 pg (ref 26.0–34.0)
MCHC: 35.3 g/dL (ref 30.0–36.0)
MCV: 91.2 fL (ref 80.0–100.0)
Monocytes Absolute: 0.8 10*3/uL (ref 0.1–1.0)
Monocytes Relative: 13 %
Neutro Abs: 4.2 10*3/uL (ref 1.7–7.7)
Neutrophils Relative %: 64 %
Platelet Count: 140 10*3/uL — ABNORMAL LOW (ref 150–400)
RBC: 4.22 MIL/uL (ref 4.22–5.81)
RDW: 12.8 % (ref 11.5–15.5)
WBC Count: 6.4 10*3/uL (ref 4.0–10.5)
nRBC: 0 % (ref 0.0–0.2)

## 2023-06-21 LAB — CMP (CANCER CENTER ONLY)
ALT: 25 U/L (ref 0–44)
AST: 37 U/L (ref 15–41)
Albumin: 4.5 g/dL (ref 3.5–5.0)
Alkaline Phosphatase: 45 U/L (ref 38–126)
Anion gap: 10 (ref 5–15)
BUN: 22 mg/dL (ref 8–23)
CO2: 25 mmol/L (ref 22–32)
Calcium: 9.3 mg/dL (ref 8.9–10.3)
Chloride: 102 mmol/L (ref 98–111)
Creatinine: 0.91 mg/dL (ref 0.61–1.24)
GFR, Estimated: >60 mL/min (ref >60)
Glucose, Bld: 113 mg/dL — ABNORMAL HIGH (ref 70–99)
Potassium: 4.3 mmol/L (ref 3.5–5.1)
Sodium: 137 mmol/L (ref 135–145)
Total Bilirubin: 1.1 mg/dL (ref 0.3–1.2)
Total Protein: 6.6 g/dL (ref 6.5–8.1)

## 2023-06-21 NOTE — Telephone Encounter (Signed)
Scheduled appointments per 7/12 los. Left voicemail for patient.

## 2023-06-21 NOTE — Progress Notes (Signed)
   [facility] OFF[facility] PROGRESS NOTE   Diagnosis: AL amyloidosis  INTERVAL HISTORY:   Eugene Martinez returns as scheduled.  He is here with his wife.  No fever, recent infection, pain, or dyspnea.  No bleeding.  He continues to have difficulty with memory loss.  Good appetite.  Objective:  Vital signs in last 24 hours:  Blood pressure 131/64, pulse 66, temperature 97.9 F (36.6 C), temperature source Temporal, resp. rate 18, height 5\' 6"  (1.676 m), weight 163 lb 11.2 oz (74.3 kg), SpO2 95%.    HEENT: Tongue appears normal Lymphatics: No cervical, supraclavicular, axillary, or inguinal nodes Resp: Lungs clear bilaterally Cardio: Regular rate and rhythm GI: No hepatosplenomegaly Vascular: No leg edema   Lab Results:  Lab Results  Component Value Date   WBC 6.4 06/21/2023   HGB 13.6 06/21/2023   HCT 38.5 (L) 06/21/2023   MCV 91.2 06/21/2023   PLT 140 (L) 06/21/2023   NEUTROABS 4.2 06/21/2023    CMP  Lab Results  Component Value Date   NA 137 06/21/2023   K 4.3 06/21/2023   CL 102 06/21/2023   CO2 25 06/21/2023   GLUCOSE 113 (H) 06/21/2023   BUN 22 06/21/2023   CREATININE 0.91 06/21/2023   CALCIUM 9.3 06/21/2023   PROT 6.6 06/21/2023   ALBUMIN 4.5 06/21/2023   AST 37 06/21/2023   ALT 25 06/21/2023   ALKPHOS 45 06/21/2023   BILITOT 1.1 06/21/2023   GFRNONAA >60 06/21/2023   GFRAA >60 01/21/2020    No results found for: "CEA1", "CEA", "ZOX096", "CA125"    Medications: I have reviewed the patient's current medications.   Assessment/Plan: AL Amyloidosis, lambda light chain, diagnosed in December 2013-most recently treated with every 2 week Velcade/Decadron, last given 03/19/2014   Status post autologous stem cell collection at [facility] 04/06/2014 Urine protein improved and serum light chains stable 05/08/2016 Urine protein stable, light chains stable 07/22/2018 Urine protein stable, light chains stable 01/19/2019 Urine protein stable, light chains  stable 07/20/2019 Urine protein stable, light chains stable 09/19/2020 Urine protein stable, light chains stable 03/19/2021 Urine protein stable, light chains stable 09/22/2021 Urine protein stable, light chains stable 03/22/2022 2. 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 Followed by cardiology, echocardiogram September 2015 with an LVEF of 65-70% and severe LVH, October 2022-severe LVH with LVEF 60-65% 4. 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  Zoster rash December 2016 6.  Memory loss 7.  COVID-19 infection July 2022       Disposition: [name] appears stable.  There is no clinical evidence for progression of the amyloidosis.  We will follow-up on the 24-hour urine protein from today.  He will return for an office and lab visit in 9 months.  He will see cardiology in October.   Thornton Papas, MD  06/21/2023  11:57 AM

## 2023-06-24 DIAGNOSIS — G3184 Mild cognitive impairment, so stated: Secondary | ICD-10-CM | POA: Diagnosis not present

## 2023-06-24 DIAGNOSIS — R453 Demoralization and apathy: Secondary | ICD-10-CM | POA: Diagnosis not present

## 2023-06-24 LAB — KAPPA/LAMBDA LIGHT CHAINS
Kappa free light chain: 16.7 mg/L (ref 3.3–19.4)
Kappa, lambda light chain ratio: 0.86 (ref 0.26–1.65)
Lambda free light chains: 19.5 mg/L (ref 5.7–26.3)

## 2023-06-25 LAB — UPEP/UIFE/LIGHT CHAINS/TP, 24-HR UR
% BETA, Urine: 0 %
ALPHA 1 URINE: 0 %
Albumin, U: 100 %
Alpha 2, Urine: 0 %
Free Kappa Lt Chains,Ur: 3.35 mg/L (ref 1.17–86.46)
Free Kappa/Lambda Ratio: 3.6 (ref 1.83–14.26)
Free Lambda Lt Chains,Ur: 0.93 mg/L (ref 0.27–15.21)
GAMMA GLOBULIN URINE: 0 %
Total Protein, Urine-Ur/day: 87 mg/24 hr (ref 30–150)
Total Protein, Urine: 5.1 mg/dL
Total Volume: 1700

## 2023-06-26 ENCOUNTER — Telehealth: Payer: Self-pay

## 2023-06-26 NOTE — Telephone Encounter (Signed)
-----   Message from Thornton Papas sent at 06/26/2023  6:43 AM EDT ----- Please call patient, urine protein remains low, f/u as scheduled

## 2023-06-26 NOTE — Telephone Encounter (Signed)
Patient gave verbal understanding and had no further questions or concerns  

## 2023-08-19 ENCOUNTER — Encounter: Payer: Self-pay | Admitting: Podiatry

## 2023-08-19 ENCOUNTER — Ambulatory Visit: Payer: PPO | Admitting: Podiatry

## 2023-08-19 DIAGNOSIS — M79676 Pain in unspecified toe(s): Secondary | ICD-10-CM

## 2023-08-19 DIAGNOSIS — B351 Tinea unguium: Secondary | ICD-10-CM

## 2023-08-19 DIAGNOSIS — D2371 Other benign neoplasm of skin of right lower limb, including hip: Secondary | ICD-10-CM | POA: Diagnosis not present

## 2023-08-19 NOTE — Progress Notes (Signed)
He presents today chief complaint of painful elongated toenails.  Objective: Toenails are long thick yellow dystrophic clinically mycotic.  Pulses are palpable.  No open lesions or wounds noted.  No benign skin lesions today.  Assessment: Pain in limb secondary to onychomycosis.  Plan: Debridement of toenails 1 through 5 bilateral.  Follow-up with him in 3 months

## 2023-08-29 ENCOUNTER — Other Ambulatory Visit: Payer: Self-pay | Admitting: Cardiovascular Disease

## 2023-09-27 ENCOUNTER — Ambulatory Visit: Payer: PPO | Attending: Cardiovascular Disease | Admitting: Medical

## 2023-09-27 ENCOUNTER — Ambulatory Visit: Payer: PPO | Admitting: Cardiovascular Disease

## 2023-09-27 ENCOUNTER — Encounter: Payer: Self-pay | Admitting: Medical

## 2023-09-27 VITALS — BP 126/66 | HR 58 | Ht 67.0 in | Wt 165.6 lb

## 2023-09-27 DIAGNOSIS — I517 Cardiomegaly: Secondary | ICD-10-CM | POA: Diagnosis not present

## 2023-09-27 DIAGNOSIS — E8581 Light chain (AL) amyloidosis: Secondary | ICD-10-CM | POA: Diagnosis not present

## 2023-09-27 DIAGNOSIS — I5032 Chronic diastolic (congestive) heart failure: Secondary | ICD-10-CM

## 2023-09-27 NOTE — Progress Notes (Unsigned)
Cardiology Office Note:    Date:  10/02/2023   ID:  Eugene Stall., DOB Jun 01, 1946, MRN [id number]  PCP:  Barbette Reichmann, MD  [facility] Cardiologist:  Julien Nordmann, MD  [facility] Electrophysiologist:  None   Referring MD: Barbette Reichmann, MD   Chief Complaint: 1 year follow-up  History of Present Illness:    Eugene Yohey. is a 77 y.o. male with a hx of AL amyloidosis with previous chemotherapy treatment, severe LVH, h/o polio with right LE brace who is being seen for 1 year follow-up.  Last seen 09/2022 and was overall stable from a cardiac perspective.   The patient denies chest pain, SOB, Lle, orthopnea or pnd. No lightheadedness or dizziness. He uses a cane. He fell in August from a mechanical fall.   Past Medical History:  Diagnosis Date   Allergic state    Amyloidosis (HCC)    Anemia    BPH (benign prostatic hyperplasia)    DDD (degenerative disc disease), lumbar    Dysplastic nevus 10/13/2013   Left posterior waistline paraspinal. Moderate to severe atypia, pattern borders on early evolving MIS, edge involved. Excised 03/31/2014, margins free.   Dysplastic nevus 12/19/2017   Right upper back medial. Mild atypia, lateral and deep margins involved.    Dysplastic nevus 11/28/2022   right prox lat thigh, Severe atypia, Excised 01/08/23   Dysrhythmia    Environmental and seasonal allergies    Gallop rhythm    GERD (gastroesophageal reflux disease)    Hypertension    Mild mitral regurgitation    a. by echo 10/2012   Pain    bil. 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 10/2012 Echo: EF 55-60%, severe LVH with speckling of myocardium -? amyloid. Mild MR, mod dil LA.    Past Surgical History:  Procedure Laterality Date   CIRCUMCISION  1984   COLONOSCOPY  03/2012   COLONOSCOPY WITH PROPOFOL N/A 10/29/2017   Procedure: COLONOSCOPY WITH PROPOFOL;  Surgeon: Christena Deem, MD;  Location: [facility] ENDOSCOPY;  Service:  Endoscopy;  Laterality: N/A;   FOOT SURGERY     right foot   FOOT SURGERY Right 2017   LEG SURGERIES     HX.OF MULTIPLE SURGERIES FOR LEG LENGTH DUE TO POLIO   LUMBAR LAMINECTOMY/DECOMPRESSION MICRODISCECTOMY N/A 01/26/2019   Procedure: LUMBAR LAMINECTOMY/DECOMPRESSION MICRODISCECTOMY 1 LEVEL L3-4;  Surgeon: Lucy Chris, MD;  Location: [facility] ORS;  Service: Neurosurgery;  Laterality: N/A;   NECK SURGERY     PILONIDAL CYST / SINUS EXCISION      Current Medications: Current Meds  Medication Sig   aspirin 81 MG chewable tablet Chew 81 mg by mouth daily.   azelastine (ASTELIN) 137 MCG/SPRAY nasal spray Place 1-2 sprays into both nostrils every 12 (twelve) hours as needed (allergies).   colestipol (COLESTID) 1 g tablet Take 2 g by mouth 2 (two) times daily.   donepezil (ARICEPT) 10 MG tablet Take 10 mg by mouth at bedtime.   escitalopram (LEXAPRO) 5 MG tablet Take 5 mg by mouth daily.   ferrous sulfate 325 (65 FE) MG tablet Take 325 mg by mouth daily.   finasteride (PROSCAR) 5 MG tablet TAKE 1 TABLET BY MOUTH EVERY DAY   memantine (NAMENDA) 5 MG tablet Take 5 mg by mouth 2 (two) times daily.   metoprolol succinate (TOPROL-XL) 25 MG 24 hr tablet TAKE 0.5 TABLETS BY MOUTH DAILY. TAKE WITH OR IMMEDIATELY FOLLOWING A  MEAL.   Misc Natural Products (NEURIVA PO) Take 1 tablet by mouth daily at 6 (six) AM.   Multiple Vitamins-Minerals (AIRBORNE PO) Take 1 tablet by mouth daily.   mupirocin ointment (BACTROBAN) 2 % Apply 1 Application topically daily. Qd to excision site   Omega-3 Fatty Acids (FISH OIL) 1200 MG CAPS Take 1,200 mg by mouth 2 (two) times daily.   omeprazole (PRILOSEC) 20 MG capsule Take 20 mg by mouth every morning.   triamcinolone cream (KENALOG) 0.1 % Apply 1 application topically daily as needed (skin rash).    vitamin E 400 UNIT capsule Take 400 Units by mouth every morning.      Allergies:   Other   Social History   Socioeconomic History   Marital status: Married    Spouse  name: Not on file   Number of children: Not on file   Years of education: Not on file   Highest education level: Not on file  Occupational History   Not on file  Tobacco Use   Smoking status: Never   Smokeless tobacco: Never  Vaping Use   Vaping status: Never Used  Substance and Sexual Activity   Alcohol use: No   Drug use: No   Sexual activity: Yes    Birth control/protection: None  Other Topics Concern   Not on file  Social History Narrative   Not on file   Social Determinants of Health   Financial Resource Strain: Low Risk  (06/11/2023)   Received from [facility]   Overall Financial Resource Strain (CARDIA)    Difficulty of Paying Living Expenses: Not hard at all  Food Insecurity: No Food Insecurity (06/11/2023)   Received from [facility]   Hunger Vital Sign    Worried About Running Out of Food in the Last Year: Never true    Ran Out of Food in the Last Year: Never true  Transportation Needs: No Transportation Needs (06/11/2023)   Received from [facility] - Transportation    In the past 12 months, has lack of transportation kept you from medical appointments or from getting medications?: No    Lack of Transportation (Non-Medical): No  Physical Activity: Not on file  Stress: Not on file  Social Connections: Not on file     Family History: The patient's family history includes Heart attack in his father.  ROS:   Please see the history of present illness.     All other systems reviewed and are negative.  EKGs/Labs/Other Studies Reviewed:    The following studies were reviewed today:  Echo 09/2021  1. Left ventricular ejection fraction, by estimation, is 60 to 65%. The  left ventricle has normal function. The left ventricle has no regional  wall motion abnormalities. There is severe left ventricular hypertrophy.  Left ventricular diastolic parameters   are consistent with Grade I diastolic  dysfunction (impaired relaxation).  The average left ventricular global longitudinal strain is -16.1 %. The  global longitudinal strain is normal.   2. Right ventricular systolic function is normal. The right ventricular  size is normal.   3. The mitral valve is normal in structure. Mild mitral valve  regurgitation. No evidence of mitral stenosis. Moderate mitral annular  calcification.   4. The aortic valve is normal in structure. Aortic valve regurgitation is  not visualized. No aortic stenosis is present.   5. The inferior vena cava is normal in size with greater than 50%  respiratory  variability, suggesting right atrial pressure of 3 mmHg.   Echo 2019 Study Conclusions   - Left ventricle: The cavity size was normal. There was severe    concentric hypertrophy. Systolic function was normal. The    estimated ejection fraction was in the range of 55% to 60%. Wall    motion was normal; there were no regional wall motion    abnormalities. Features are consistent with a pseudonormal left    ventricular filling pattern, with concomitant abnormal relaxation    and increased filling pressure (grade 2 diastolic dysfunction).  - Aortic valve: There was mild regurgitation.  - Mitral valve: Calcified annulus. There was mild regurgitation.  - Left atrium: The atrium was mildly dilated.  - Right ventricle: Systolic function was normal.  - Pulmonary arteries: Systolic pressure was within the normal    range.   EKG:  EKG is ordered today.  The ekg ordered today demonstrates SB 1st degree AV block ,LAD, RBBB, LVH, no change  Recent Labs: 06/21/2023: ALT 25; BUN 22; Creatinine 0.91; Hemoglobin 13.6; Platelet Count 140; Potassium 4.3; Sodium 137  Recent Lipid Panel    Component Value Date/Time   CHOL 209 (H) 10/31/2012 0451   TRIG 118 10/31/2012 0451   HDL 37 (L) 10/31/2012 0451   CHOLHDL 5.6 10/31/2012 0451   VLDL 24 10/31/2012 0451   LDLCALC 148 (H) 10/31/2012 0451    Physical Exam:     VS:  BP 126/66 (BP Location: Left Arm, Patient Position: Sitting, Cuff Size: Normal)   Pulse (!) 58   Ht 5\' 7"  (1.702 m)   Wt 165 lb 9.6 oz (75.1 kg)   SpO2 95%   BMI 25.94 kg/m     Wt Readings from Last 3 Encounters:  09/27/23 165 lb 9.6 oz (75.1 kg)  06/21/23 163 lb 11.2 oz (74.3 kg)  10/03/22 164 lb (74.4 kg)     GEN:  Well nourished, well developed in no acute distress HEENT: Normal NECK: No JVD; No carotid bruits LYMPHATICS: No lymphadenopathy CARDIAC: RRR, no murmurs, rubs, gallops RESPIRATORY:  Clear to auscultation without rales, wheezing or rhonchi  ABDOMEN: Soft, non-tender, non-distended MUSCULOSKELETAL:  No edema; No deformity  SKIN: Warm and dry NEUROLOGIC:  Alert and oriented x 3 PSYCHIATRIC:  Normal affect   ASSESSMENT:    1. LVH (left ventricular hypertrophy)   2. Chronic diastolic heart failure (HCC)   3. AL amyloidosis (HCC)    PLAN:    In order of problems listed above:  LVH Echo in 2022 showed LVEF 60-65%, no Wma, severe LVH with no obstruction, G1DD. He has known amyloidosis. The patient is on Toprol 12.5mg  daily. No chest pain, SOB, LLE, pre-syncope or syncope. I will repeat an echo.  Amyloidosis Previously treated with chemotherapy. Followed by oncology. Repeat echo as above.   Disposition: Follow up in 1 year(s) with MD/APP     Signed, Brandt Chaney David Stall, PA-C  10/02/2023 3:03 PM    [facility]

## 2023-09-27 NOTE — Patient Instructions (Signed)
Medication Instructions:  No changes *If you need a refill on your cardiac medications before your next appointment, please call your pharmacy*   Lab Work: None ordered If you have labs (blood work) drawn today and your tests are completely normal, you will receive your results only by: MyChart Message (if you have MyChart) OR A paper copy in the mail If you have any lab test that is abnormal or we need to change your treatment, we will call you to review the results.   Testing/Procedures: Your physician has requested that you have an echocardiogram. Echocardiography is a painless test that uses sound waves to create images of your heart. It provides your doctor with information about the size and shape of your heart and how well your heart's chambers and valves are working.   You may receive an ultrasound enhancing agent through an IV if needed to better visualize your heart during the echo. This procedure takes approximately one hour.  There are no restrictions for this procedure.  This will take place at [address] [facility], [place] [address]    Follow-Up: At [facility], you and your health needs are our priority.  As part of our continuing mission to provide you with exceptional heart care, we have created designated Provider Care Teams.  These Care Teams include your primary Cardiologist (physician) and Advanced Practice Providers (APPs -  Physician Assistants and Nurse Practitioners) who all work together to provide you with the care you need, when you need it.  We recommend signing up for the patient portal called "MyChart".  Sign up information is provided on this After Visit Summary.  MyChart is used to connect with patients for Virtual Visits (Telemedicine).  Patients are able to view lab/test results, encounter notes, upcoming appointments, etc.  Non-urgent messages can be sent to your provider as well.   To learn more about what you can  do with MyChart, go to [email or web address].    Your next appointment:   12 month(s)  Provider:   You may see Julien Nordmann, MD or one of the following Advanced Practice Providers on your designated Care Team:   Nicolasa Ducking, NP Eula Listen, PA-C Cadence Fransico Michael, PA-C Charlsie Quest, NP

## 2023-09-27 NOTE — Progress Notes (Deleted)
Cardiology Office Note  Date:  09/27/2023   ID:  Eugene Martinez., DOB 1946-02-02, MRN [id number]  PCP:  Barbette Reichmann, MD   No chief complaint on file.   HPI:  77 year old gentleman with history of  Polio, uses a brace right lower extremity AL Amyloidosis, diagnosis 2013, previous chemo,  remission since 11/2013, renal biopsy established the diagnosis of AL amyloidosis. treated with every 2 week Velcade/Decadron, last given 03/19/2014  Followed by oncology, Dr. Truett Perna  severe LVH on echo and MRI,  exertional syncope Post-polio, brace right LE who presents for routine follow-up of his severe LVH, amyloid disease  Last seen by myself in clinic October 2023  Feels well, denies SOB, no chest pain Denies any chest pain on hills and stairs Walks with a brace on the right leg, prior history of polio No near-syncope or syncope No lower extremity edema  Exercises every morning For 30 min, 6 x a week  Brother with alzheimers, currently in the hospital  Lab work reviewed A1C 5.7 Total chol 148, LDL 82  EKG personally reviewed by myself on todays visit Normal sinus rhythm rate 70 bpm right bundle branch block, left anterior fascicular block, unchanged from prior EKGs  Past medical history reviewed Follwed by Dr. Truett Perna, for AL amyloidosis Seen a few weeks ago Urine collection ordered, labs still pending   Aricept for memory loss.   Prior history of falls  Echocardiogram October 2022  1. Left ventricular ejection fraction, by estimation, is 60 to 65%. The  left ventricle has normal function. The left ventricle has no regional  wall motion abnormalities. There is severe left ventricular hypertrophy.  Left ventricular diastolic parameters   are consistent with Grade I diastolic dysfunction (impaired relaxation).  The average left ventricular global longitudinal strain is -16.1 %. The  global longitudinal strain is normal.   2. Right ventricular systolic function  is normal. The right ventricular  size is normal.   3. The mitral valve is normal in structure. Mild mitral valve  regurgitation. No evidence of mitral stenosis. Moderate mitral annular  calcification.   4. The aortic valve is normal in structure. Aortic valve regurgitation is  not visualized. No aortic stenosis is present.   5. The inferior vena cava is normal in size with greater than 50%  respiratory variability, suggesting right atrial pressure of 3 mmHg.    September 2015 hospitalized at [facility] hospital after experiencing exertional syncope while walking up steps    echocardiogram with  significant left ventricular hypertrophy and a speckling of the myocardium   Systolic function was normal with ejection fraction of 55-60% and the left atrium was moderately dilated.  cardiac MRI 2013 Severe LVH, Left ventricular wall motion was normal with an ejection fraction of 66%. mild right ventricular hypertrophy and normal right atrial size and mild to moderate mitral regurgitation.  urine immunoelectrophoresis   light chain Bence-Jones protein in the urine.  fat pad biopsy which was not diagnostic for myeloma or amyloidosis and a bone marrow biopsy which was not diagnostic.  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  completed chemotherapy for his amyloidosis and has been in remission since December 2014.  He has had previous harvest of stem cells down at [facility] but has not had a stem cell transplant.  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  PSH:    Past Surgical History:  Procedure Laterality Date   CIRCUMCISION  1984   COLONOSCOPY  03/2012   COLONOSCOPY WITH PROPOFOL N/A 10/29/2017   Procedure: COLONOSCOPY WITH PROPOFOL;  Surgeon: Christena Deem, MD;  [facility] ENDOSCOPY;  Service: Endoscopy;  Laterality: N/A;   FOOT SURGERY     right foot   FOOT SURGERY Right 2017   LEG SURGERIES     HX.OF MULTIPLE SURGERIES FOR LEG LENGTH DUE TO POLIO   LUMBAR LAMINECTOMY/DECOMPRESSION MICRODISCECTOMY N/A 01/26/2019   Procedure: LUMBAR LAMINECTOMY/DECOMPRESSION MICRODISCECTOMY 1 LEVEL L3-4;  Surgeon: Lucy Chris, MD;  Location: [facility] ORS;  Service: Neurosurgery;  Laterality: N/A;   NECK SURGERY     PILONIDAL CYST / SINUS EXCISION      Current Outpatient Medications  Medication Sig Dispense Refill   aspirin 81 MG chewable tablet Chew 81 mg by mouth daily.     azelastine (ASTELIN) 137 MCG/SPRAY nasal spray Place 1-2 sprays into both nostrils every 12 (twelve) hours as needed (allergies).     colestipol (COLESTID) 1 g tablet Take 2 g by mouth 2 (two) times daily.     donepezil (ARICEPT) 10 MG tablet Take 10 mg by mouth at bedtime.     escitalopram (LEXAPRO) 5 MG tablet Take 5 mg by mouth daily.     ferrous sulfate 325 (65 FE) MG tablet Take 325 mg by mouth daily.     finasteride (PROSCAR) 5 MG tablet TAKE 1 TABLET BY MOUTH EVERY DAY     memantine (NAMENDA) 5 MG tablet Take 5 mg by mouth 2 (two) times daily.     metoprolol succinate (TOPROL-XL) 25 MG 24 hr tablet TAKE 0.5 TABLETS BY MOUTH DAILY. TAKE WITH OR IMMEDIATELY FOLLOWING A MEAL. 45 tablet 0   Misc Natural Products (NEURIVA PO) Take 1 tablet by mouth daily at  6 (six) AM.     Multiple Vitamins-Minerals (AIRBORNE PO) Take 1 tablet by mouth daily.     mupirocin ointment (BACTROBAN) 2 % Apply 1 Application topically daily. Qd to excision site 22 g 0   Omega-3 Fatty Acids (FISH OIL) 1200 MG CAPS Take 1,200 mg by mouth 2 (two) times daily.     omeprazole (PRILOSEC) 20 MG capsule Take 20 mg by mouth every morning.     triamcinolone cream (KENALOG) 0.1 % Apply 1 application topically daily as needed (skin rash).   2   vitamin E 400 UNIT capsule Take 400 Units by mouth every morning.      No current facility-administered medications for this visit.     Allergies:   Other   Social History:  The patient  reports that he has never smoked. He has never used smokeless tobacco. He reports that he does not drink alcohol and does not use drugs.   Family History:   family history includes Heart attack in his father.    Review of Systems: Review of Systems  Constitutional: Negative.   HENT: Negative.    Respiratory: Negative.    Cardiovascular: Negative.   Gastrointestinal: Negative.  Musculoskeletal:  Positive for joint pain.       Leg weakness  Neurological: Negative.   Psychiatric/Behavioral: Negative.    All other systems reviewed and are negative.   PHYSICAL EXAM: VS:  There were no vitals taken for this visit. , BMI There is no height or weight on file to calculate BMI. Constitutional:  oriented to person, place, and time. No distress.  HENT:  Head: Grossly normal Eyes:  no discharge. No scleral icterus.  Neck: No JVD, no carotid bruits  Cardiovascular: Regular rate and rhythm, no murmurs appreciated Pulmonary/Chest: Clear to auscultation bilaterally, no wheezes or rails Abdominal: Soft.  no distension.  no tenderness.  Musculoskeletal: Normal range of motion Neurological:  normal muscle tone. Coordination normal. No atrophy Skin: Skin warm and dry Psychiatric: normal affect, pleasant   Recent Labs: 06/21/2023: ALT 25; BUN 22;  Creatinine 0.91; Hemoglobin 13.6; Platelet Count 140; Potassium 4.3; Sodium 137    Lipid Panel Lab Results  Component Value Date   CHOL 209 (H) 10/31/2012   HDL 37 (L) 10/31/2012   LDLCALC 148 (H) 10/31/2012   TRIG 118 10/31/2012    Wt Readings from Last 3 Encounters:  06/21/23 163 lb 11.2 oz (74.3 kg)  10/03/22 164 lb (74.4 kg)  09/20/22 158 lb 12.8 oz (72 kg)     ASSESSMENT AND PLAN:  Essential hypertension - Plan: EKG 12-Lead Blood pressure is well controlled on today's visit. No changes made to the medications.  Syncope, unspecified syncope type Denies any near-syncope or syncope   severe LVH anatomy  on echocardiogram Started on metoprolol succinate 12.5 daily Discussed other medication options if he develops symptoms of outflow tract obstruction such as camzyos  AL amyloidosis (HCC) Previous chemotherapy, stable since 2014 Followed by oncology Recent echocardiogram 2022 unchanged  LVH (left ventricular hypertrophy)  severe LVH on echocardiogram and MRI Long discussion concerning various treatment options, low-dose metoprolol initiated  Hyperlipidemia not on a statin Cholesterol very reasonable   Total encounter time more than 30 minutes  Greater than 50% was spent in counseling and coordination of care with the patient   No orders of the defined types were placed in this encounter.     Signed, Dossie Arbour, M.D., Ph.D. 09/27/2023  [facility] [place], [place] [phone]

## 2023-10-15 ENCOUNTER — Ambulatory Visit: Payer: PPO | Attending: Medical

## 2023-10-15 DIAGNOSIS — I517 Cardiomegaly: Secondary | ICD-10-CM

## 2023-10-15 LAB — ECHOCARDIOGRAM COMPLETE
Area-P 1/2: 2.76 cm2
S' Lateral: 2.1 cm

## 2023-10-18 DIAGNOSIS — J4520 Mild intermittent asthma, uncomplicated: Secondary | ICD-10-CM | POA: Diagnosis not present

## 2023-10-18 DIAGNOSIS — R2681 Unsteadiness on feet: Secondary | ICD-10-CM | POA: Diagnosis not present

## 2023-10-18 DIAGNOSIS — I10 Essential (primary) hypertension: Secondary | ICD-10-CM | POA: Diagnosis not present

## 2023-10-18 DIAGNOSIS — G14 Postpolio syndrome: Secondary | ICD-10-CM | POA: Diagnosis not present

## 2023-10-18 DIAGNOSIS — R29898 Other symptoms and signs involving the musculoskeletal system: Secondary | ICD-10-CM | POA: Diagnosis not present

## 2023-10-18 DIAGNOSIS — D649 Anemia, unspecified: Secondary | ICD-10-CM | POA: Diagnosis not present

## 2023-10-18 DIAGNOSIS — R7309 Other abnormal glucose: Secondary | ICD-10-CM | POA: Diagnosis not present

## 2023-10-18 DIAGNOSIS — Z0000 Encounter for general adult medical examination without abnormal findings: Secondary | ICD-10-CM | POA: Diagnosis not present

## 2023-10-18 DIAGNOSIS — R413 Other amnesia: Secondary | ICD-10-CM | POA: Diagnosis not present

## 2023-10-22 DIAGNOSIS — R262 Difficulty in walking, not elsewhere classified: Secondary | ICD-10-CM | POA: Insufficient documentation

## 2023-10-22 DIAGNOSIS — I10 Essential (primary) hypertension: Secondary | ICD-10-CM | POA: Diagnosis not present

## 2023-10-22 DIAGNOSIS — J4520 Mild intermittent asthma, uncomplicated: Secondary | ICD-10-CM | POA: Diagnosis not present

## 2023-10-22 DIAGNOSIS — G14 Postpolio syndrome: Secondary | ICD-10-CM | POA: Diagnosis not present

## 2023-10-22 DIAGNOSIS — E8589 Other amyloidosis: Secondary | ICD-10-CM | POA: Diagnosis not present

## 2023-10-22 DIAGNOSIS — R413 Other amnesia: Secondary | ICD-10-CM | POA: Diagnosis not present

## 2023-10-22 DIAGNOSIS — D696 Thrombocytopenia, unspecified: Secondary | ICD-10-CM | POA: Diagnosis not present

## 2023-10-22 DIAGNOSIS — R7309 Other abnormal glucose: Secondary | ICD-10-CM | POA: Diagnosis not present

## 2023-10-22 DIAGNOSIS — I5032 Chronic diastolic (congestive) heart failure: Secondary | ICD-10-CM | POA: Diagnosis not present

## 2023-10-31 DIAGNOSIS — G14 Postpolio syndrome: Secondary | ICD-10-CM | POA: Diagnosis not present

## 2023-10-31 DIAGNOSIS — G3184 Mild cognitive impairment, so stated: Secondary | ICD-10-CM | POA: Diagnosis not present

## 2023-10-31 DIAGNOSIS — R453 Demoralization and apathy: Secondary | ICD-10-CM | POA: Diagnosis not present

## 2023-11-19 ENCOUNTER — Other Ambulatory Visit: Payer: Self-pay | Admitting: Cardiovascular Disease

## 2023-11-20 ENCOUNTER — Ambulatory Visit: Payer: PPO | Admitting: Podiatry

## 2023-11-20 ENCOUNTER — Encounter: Payer: Self-pay | Admitting: Podiatry

## 2023-11-20 DIAGNOSIS — B351 Tinea unguium: Secondary | ICD-10-CM | POA: Diagnosis not present

## 2023-11-20 DIAGNOSIS — M79676 Pain in unspecified toe(s): Secondary | ICD-10-CM | POA: Diagnosis not present

## 2023-11-20 NOTE — Progress Notes (Signed)
He presents today chief complaint of painful elongated toenails 1 through 5 bilaterally.  Objective: Pulses are palpable.  No open lesions or wounds toenails are long thick yellow dystrophic like mycotic.  Assessment: Pain limb secondary onychomycosis 1 through 5 bilateral.  Plan: Debridement toenails 1 through 5 bilateral is covered service.

## 2023-11-25 ENCOUNTER — Ambulatory Visit: Payer: PPO

## 2023-11-25 ENCOUNTER — Ambulatory Visit: Payer: PPO | Attending: Student | Admitting: Physical Therapy

## 2023-11-25 ENCOUNTER — Other Ambulatory Visit: Payer: Self-pay

## 2023-11-25 ENCOUNTER — Encounter: Payer: Self-pay | Admitting: Physical Therapy

## 2023-11-25 DIAGNOSIS — R2681 Unsteadiness on feet: Secondary | ICD-10-CM | POA: Insufficient documentation

## 2023-11-25 DIAGNOSIS — R2689 Other abnormalities of gait and mobility: Secondary | ICD-10-CM | POA: Insufficient documentation

## 2023-11-25 DIAGNOSIS — M6281 Muscle weakness (generalized): Secondary | ICD-10-CM | POA: Diagnosis not present

## 2023-11-25 DIAGNOSIS — R41841 Cognitive communication deficit: Secondary | ICD-10-CM | POA: Insufficient documentation

## 2023-11-25 NOTE — Therapy (Signed)
OUTPATIENT PHYSICAL THERAPY NEURO EVALUATION   Patient Name: Eugene Martinez. MRN: [id number] DOB:July 19, 1946, 77 y.o., male Today's Date: 11/25/2023   PCP: Barbette Reichmann, MD REFERRING PROVIDER: Janice Coffin, PA-C   END OF SESSION:  PT End of Session - 11/25/23 1018     Visit Number 1    Number of Visits 13    Date for PT Re-Evaluation 01/10/24    Authorization Type HTA    Progress Note Due on Visit 10    PT Start Time 1015    PT Stop Time 1102    PT Time Calculation (min) 47 min    Equipment Utilized During Treatment --   will need gait belt due to fall risk   Activity Tolerance Patient tolerated treatment well    Behavior During Therapy WFL for tasks assessed/performed             Past Medical History:  Diagnosis Date   Allergic state    Amyloidosis (HCC)    Anemia    BPH (benign prostatic hyperplasia)    DDD (degenerative disc disease), lumbar    Dysplastic nevus 10/13/2013   Left posterior waistline paraspinal. Moderate to severe atypia, pattern borders on early evolving MIS, edge involved. Excised 03/31/2014, margins free.   Dysplastic nevus 12/19/2017   Right upper back medial. Mild atypia, lateral and deep margins involved.    Dysplastic nevus 11/28/2022   right prox lat thigh, Severe atypia, Excised 01/08/23   Dysrhythmia    Environmental and seasonal allergies    Gallop rhythm    GERD (gastroesophageal reflux disease)    Hypertension    Mild mitral regurgitation    a. by echo 10/2012   Pain    bil. 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 10/2012 Echo: EF 55-60%, severe LVH with speckling of myocardium -? amyloid. Mild MR, mod dil LA.   Past Surgical History:  Procedure Laterality Date   CIRCUMCISION  1984   COLONOSCOPY  03/2012   COLONOSCOPY WITH PROPOFOL N/A 10/29/2017   Procedure: COLONOSCOPY WITH PROPOFOL;  Surgeon: Christena Deem, MD;  Location: [facility] ENDOSCOPY;  Service: Endoscopy;  Laterality: N/A;   FOOT  SURGERY     right foot   FOOT SURGERY Right 2017   LEG SURGERIES     HX.OF MULTIPLE SURGERIES FOR LEG LENGTH DUE TO POLIO   LUMBAR LAMINECTOMY/DECOMPRESSION MICRODISCECTOMY N/A 01/26/2019   Procedure: LUMBAR LAMINECTOMY/DECOMPRESSION MICRODISCECTOMY 1 LEVEL L3-4;  Surgeon: Lucy Chris, MD;  Location: [facility] ORS;  Service: Neurosurgery;  Laterality: N/A;   NECK SURGERY     PILONIDAL CYST / SINUS EXCISION     Patient Active Problem List   Diagnosis Date Noted   Anemia 08/01/2020   Elevated hemoglobin A1c 08/01/2020   Loss of memory 08/01/2020   Sesamoiditis 06/25/2019   S/P lumbar laminectomy 01/26/2019   Weakness of both lower limbs 10/24/2018   LVH (left ventricular hypertrophy) 08/29/2016   Allergy 02/20/2016   Amyloidosis (HCC) 02/20/2016   Thrombocytopenia (HCC) 11/23/2015   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   Weight loss, unintentional 06/29/2013   Airway hyperreactivity 05/07/2013   Benign fibroma of prostate 05/07/2013   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   Chronic diastolic heart failure (HCC) 11/18/2012   Proteinuria, Bence Jones 11/18/2012   Multiple allergies 10/29/2012   Hypertension 10/29/2012   RBBB (right bundle branch block with left anterior fascicular block) 10/29/2012   Proteinuria 10/29/2012    ONSET DATE:  11/06/2023 (MD referral)  REFERRING DIAG: G31.84 (ICD-10-CM) - Mild cognitive impairment of uncertain or unknown etiology   THERAPY DIAG:  Unsteadiness on feet  Other abnormalities of gait and mobility  Muscle weakness (generalized)  Rationale for Evaluation and Treatment: Rehabilitation  SUBJECTIVE:                                                                                                                                                                                             SUBJECTIVE STATEMENT: Has been dx with MCI, fairly stable for the last 1-2 years.  PA suggested physical therapy, and she sent [place] here.  Use the cane in the house.   Wife notices that R side gets weaker, this is the side that was affected by polio.  No falls, but he is unsure. 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 No  PRECAUTIONS: Fall; wears R AFO  RED FLAGS: None   WEIGHT BEARING RESTRICTIONS: No  FALLS: Has patient fallen in last 6 months? 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   OBJECTIVE:  Note: Objective measures were completed at Evaluation unless otherwise noted.  DIAGNOSTIC FINDINGS: NA  COGNITION: Overall cognitive status: History of cognitive impairments - at baseline   SENSATION: Has numbness in toes  POSTURE: rounded shoulders, forward head, and R leg shorter than the L  LOWER EXTREMITY ROM:   Active ROM WFL BLEs, some limitation in R ankle due to weakness, but able to initiate ankle dorsiflexion   LOWER EXTREMITY MMT:    MMT Right Eval Left Eval  Hip flexion 4 4+  Hip extension    Hip abduction    Hip adduction    Hip internal rotation    Hip external rotation    Knee flexion 4 4+  Knee extension 4 4+  Ankle dorsiflexion    Ankle plantarflexion    Ankle inversion    Ankle eversion    (Blank rows = not tested)   TRANSFERS: Assistive device utilized: None  Sit to stand: SBA Stand to sit: SBA  GAIT: Gait pattern: step through  pattern and wide BOS Distance walked: 50 ft x 2 Assistive device utilized: Quad cane small base Level of assistance: SBA Comments: Wears R AFO (hx of polio)  FUNCTIONAL TESTS:  5 times sit to stand: 12.69 sec Timed up and go (TUG): 20.79 sec (does not use cane) Berg Balance Scale: 35/56  10 meter walk:  21 sec (1.56 ft/sec)  [facility] PT Assessment - 11/25/23 1040       Standardized Balance Assessment   Standardized Balance Assessment Berg Balance Test      Berg Balance Test   Sit to Stand Able to stand  independently using hands    Standing Unsupported Able to stand safely 2 minutes    Sitting with Back Unsupported but Feet Supported on Floor or Stool Able to sit safely and securely 2 minutes    Stand to Sit Controls descent by using hands    Transfers Able to transfer safely, minor use of hands    Standing Unsupported with Eyes Closed Able to stand 10 seconds with supervision    Standing Unsupported with Feet Together Able to place feet together independently and stand for 1 minute with supervision    From Standing, Reach Forward with Outstretched Arm Can reach forward >5 cm safely (2")   4"   From Standing Position, Pick up Object from Floor Able to pick up shoe, needs supervision    From Standing Position, Turn to Look Behind Over each Shoulder Looks behind one side only/other side shows less weight shift    Turn 360 Degrees Needs close supervision or verbal cueing   9 sec, 6 sec   Standing Unsupported, Alternately Place Feet on Step/Stool Able to complete >2 steps/needs minimal assist    Standing Unsupported, One Foot in Front Needs help to step but can hold 15 seconds    Standing on One Leg Unable to try or needs assist to prevent fall    Total Score 35    Berg comment: Scores <45/56 indicate increased fall risk             TODAY'S TREATMENT:                                                                                                                              DATE:  11/25/2023    PATIENT EDUCATION: Education details: Eval results, POC, initiated HEP; pt and wife agreeable to transfer to [facility]. Person educated: Patient and Spouse Education method: Explanation, Demonstration, and Handouts Education comprehension: verbalized understanding and returned demonstration  HOME EXERCISE PROGRAM: Access Code: [email or web address] URL: [email or web address] Date: 11/25/2023 Prepared by: [facility] Outpatient  Rehab - [facility]  Exercises - Single Leg Stance with Support  - 1 x daily - 5 x weekly - 1 sets - 3 reps - 10 sec hold - Standing Romberg to 1/2 Tandem Stance  -  1 x daily - 5 x weekly - 1 sets - 3 reps - 15 sec hold  GOALS: Goals reviewed with patient? Yes  SHORT TERM GOALS: Target date: 12/27/2023  Pt will be supervision with HEP for improved balance, gait. Baseline: Goal status: INITIAL  2.  Pt will improve TUG score to less than or equal to 15 sec for decreased fall risk. Baseline: 20.79 sec Goal status: INITIAL  3.  Pt will improve Berg score to at least 40/56 to decrease fall risk. Baseline: 35/56 Goal status: INITIAL  4.  Pt/wife will verbalize understanding of fall prevention in home environment.  Baseline:  Goal status: INITIAL   LONG TERM GOALS: Target date: 01/10/2024  Pt will be supervision with progression of HEP for improved balance, gait. Baseline:  Goal status: INITIAL  2.  Pt will improve gait velocity to at least 1.8 ft/sec for improved gait efficiency and safety. Baseline: 1.56 ft/sec Goal status: INITIAL  3.  Pt will improve Berg score to at least 45/56 to decrease fall risk. Baseline: 35/56 Goal status: INITIAL  4.  Pt will perform floor>stand transfer with min assist for safe fall recovery  Baseline:  Wife reports she was unable to get him up on his own with fall 8 months ago  Goal status:  INITIAL   ASSESSMENT:  CLINICAL IMPRESSION: Patient is a 77 y.o. male with hx of post-polio  who was seen today for physical therapy evaluation and treatment with MCI. Wife and pt report he is more fearful of falling; he had a fall 8 months ago and was unable to get up on his own, needing multiple people to help him up.  He presents with decreased strength, balance, gait independence.  He is at increased fall risk per Berg, TUG, gait velocity measures.  He will benefit from skilled PT to address the above stated deficits for improved functional mobility and decreased fall risk.  OBJECTIVE IMPAIRMENTS: Abnormal gait, decreased balance, decreased mobility, difficulty walking, decreased strength, and postural dysfunction.   ACTIVITY LIMITATIONS: standing, squatting, transfers, and locomotion level  PARTICIPATION LIMITATIONS: community activity and church  PERSONAL FACTORS: 3+ comorbidities: see above PMH  are also affecting patient's functional outcome.   REHAB POTENTIAL: Good  CLINICAL DECISION MAKING: Evolving/moderate complexity  EVALUATION COMPLEXITY: Moderate  PLAN:  PT FREQUENCY: 1-2x/week  PT DURATION: 6 weeks plus eval visit  PLANNED INTERVENTIONS: 97110-Therapeutic exercises, 97530- Therapeutic activity, 97112- Neuromuscular re-education, 97535- Self Care, [id number]- Manual therapy, [phone]- Gait training, Patient/Family education, Balance training, and DME instructions  PLAN FOR NEXT SESSION: Review initial HEP and progress for balance; work on functional strengthening for getting up from floor, balance exercises.  *Pt is going to transfer to [facility] due to closer proximity to their home.   Gean Maidens., PT 11/25/2023, 11:06 AM  [facility] [address] [place], [address] [place], [place] [address] Phone # [phone] Fax # [phone]

## 2023-11-25 NOTE — Patient Instructions (Signed)

## 2023-11-25 NOTE — Therapy (Signed)
OUTPATIENT SPEECH LANGUAGE PATHOLOGY EVALUATION   Patient Name: Eugene Martinez. MRN: [id number] DOB:05-Sep-1946, 77 y.o., male Today's Date: 11/25/2023  PCP: Barbette Reichmann, MD REFERRING PROVIDER: Janice Coffin, PA-C  END OF SESSION:  End of Session - 11/25/23 1211     Visit Number 1    Number of Visits 13    Date for SLP Re-Evaluation 01/31/24    SLP Start Time 1104    SLP Stop Time  1150    SLP Time Calculation (min) 46 min    Activity Tolerance Patient tolerated treatment well             Past Medical History:  Diagnosis Date   Allergic state    Amyloidosis (HCC)    Anemia    BPH (benign prostatic hyperplasia)    DDD (degenerative disc disease), lumbar    Dysplastic nevus 10/13/2013   Left posterior waistline paraspinal. Moderate to severe atypia, pattern borders on early evolving MIS, edge involved. Excised 03/31/2014, margins free.   Dysplastic nevus 12/19/2017   Right upper back medial. Mild atypia, lateral and deep margins involved.    Dysplastic nevus 11/28/2022   right prox lat thigh, Severe atypia, Excised 01/08/23   Dysrhythmia    Environmental and seasonal allergies    Gallop rhythm    GERD (gastroesophageal reflux disease)    Hypertension    Mild mitral regurgitation    a. by echo 10/2012   Pain    bil. 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 10/2012 Echo: EF 55-60%, severe LVH with speckling of myocardium -? amyloid. Mild MR, mod dil LA.   Past Surgical History:  Procedure Laterality Date   CIRCUMCISION  1984   COLONOSCOPY  03/2012   COLONOSCOPY WITH PROPOFOL N/A 10/29/2017   Procedure: COLONOSCOPY WITH PROPOFOL;  Surgeon: Christena Deem, MD;  Location: [facility] ENDOSCOPY;  Service: Endoscopy;  Laterality: N/A;   FOOT SURGERY     right foot   FOOT SURGERY Right 2017   LEG SURGERIES     HX.OF MULTIPLE SURGERIES FOR LEG LENGTH DUE TO POLIO   LUMBAR LAMINECTOMY/DECOMPRESSION MICRODISCECTOMY N/A 01/26/2019    Procedure: LUMBAR LAMINECTOMY/DECOMPRESSION MICRODISCECTOMY 1 LEVEL L3-4;  Surgeon: Lucy Chris, MD;  Location: [facility] ORS;  Service: Neurosurgery;  Laterality: N/A;   NECK SURGERY     PILONIDAL CYST / SINUS EXCISION     Patient Active Problem List   Diagnosis Date Noted   Anemia 08/01/2020   Elevated hemoglobin A1c 08/01/2020   Loss of memory 08/01/2020   Sesamoiditis 06/25/2019   S/P lumbar laminectomy 01/26/2019   Weakness of both lower limbs 10/24/2018   LVH (left ventricular hypertrophy) 08/29/2016   Allergy 02/20/2016   Amyloidosis (HCC) 02/20/2016   Thrombocytopenia (HCC) 11/23/2015   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   Weight loss, unintentional 06/29/2013   Airway hyperreactivity 05/07/2013   Benign fibroma of prostate 05/07/2013   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   Chronic diastolic heart failure (HCC) 11/18/2012   Proteinuria, Bence Jones 11/18/2012   Multiple allergies 10/29/2012   Hypertension 10/29/2012   RBBB (right bundle branch block with left anterior fascicular block) 10/29/2012   Proteinuria 10/29/2012    ONSET DATE: script dated 11/06/23  REFERRING DIAG:  G31.84 (ICD-10-CM) - Mild cognitive impairment of uncertain or unknown etiology    THERAPY DIAG:  Cognitive communication deficit  Rationale for Evaluation and Treatment: Rehabilitation  SUBJECTIVE:   SUBJECTIVE STATEMENT: "When he was going  to the cardiologist in October, he ended up in [employer] at the [employer]." Pt accompanied by: significant other  PERTINENT HISTORY:  Arrives with dx of MCI. PMH of chemotherapy (2014), and polio (1954). Other PMH Primary hypertension  Elevated hemoglobin A1c  Thrombocytopenia (CMS-HCC)  Post-polio syndrome (CMS-HCC)  Other amyloidosis (CMS/HHS-HCC)  Mild intermittent asthma without complication (HHS-HCC)  Chronic diastolic heart failure (CMS/HHS-HCC  PAIN:  Are you having pain? No  FALLS: Has patient fallen in last 6 months?  See PT evaluation for  details  LIVING ENVIRONMENT: Lives with: lives with their spouse Lives in: House/apartment  PLOF:  Level of assistance: Comment: needs assistance with tasks that involve memory Employment: Retired  PATIENT GOALS: improve memory skills  OBJECTIVE:  Note: Objective measures were completed at Evaluation unless otherwise noted.  DIAGNOSTIC FINDINGS:  MRI August 2022 IMPRESSION: 1. No acute intracranial abnormality. 2. Mild chronic small vessel ischemic disease. 3. NeuroQuant volumetric analysis of the brain, see details on [employer].  COGNITION: Overall cognitive status: Impaired Areas of impairment:  Memory: Impaired: Immediate Working Short term Functional deficits: see "clinical impressions" below  COGNITIVE COMMUNICATION: Following directions: Follows multi-step commands inconsistently  Auditory comprehension: WFL Verbal expression: WFL Functional communication: WFL  ORAL MOTOR EXAMINATION: Overall status: WFL  STANDARDIZED ASSESSMENTS: [facility] Verbal Learning Test: Recall trial 1:4/12 (below WNL), trial 2: 5/12 (below WNL), trial 3: 4/12 (below WNL). Recognition: 5 (below WNL) Scores and performance indicates pt's memory encoding is deficient. More in-depth testing may want to be considered via neuropsychology.  PATIENT REPORTED OUTCOME MEASURES (PROM): To be provided in first 2 therapy sessions   TODAY'S TREATMENT:                                                                                                                                         DATE:  11/25/23 (eval): SLP discussed role of SLP in rehab process - therapy would be primarily compensation/strategy-based. SLP ascertained what were some current difficulties pt is experiencing and suggested strategies to try as SLP provided pt and wife with a list of general memory compensations.   PATIENT EDUCATION: Education details: see "today's treatment" above Person educated: Patient and Spouse Education  method: Explanation, Demonstration, and Handouts Education comprehension: verbalized understanding and needs further education   GOALS: Goals reviewed with patient? Yes  SHORT TERM GOALS: Target date: 01/03/24  Pt will demo knowledge that he needs to use practical compensation in a situation which has been difficult in the past, x2 sessions Baseline: Goal status: INITIAL  2.  Pt and/or wife will develop a medication administration system  Baseline:  Goal status: INITIAL  3.  Pt will make it on time to 100% of appointments in a 2-week period prior to 01/03/24 using compensations Baseline:  Goal status: INITIAL  4.  Pt will complete a financial task using a memory compensation system prior to 01/03/24 Baseline:  Goal  status: INITIAL   LONG TERM GOALS: Target date: 01/31/24  Pt will improve PROM compared to initial administration Baseline:  Goal status: INITIAL  2.  Pt will successfully use a medication administration system for two weeks prior to 01/31/24 Baseline:  Goal status: INITIAL  3.  Pt will make it on time to 100% of appointments in a 2-week period  after 01/03/24 using compensations Baseline:  Goal status: INITIAL  4.  Pt will complete a financial responsibility using a memory compensation system, between two sessions Baseline:  Goal status: INITIAL   ASSESSMENT:  CLINICAL IMPRESSION: Patient is a 77 y.o. M  who was seen today for cognitive communication deficit. Afternoon medications are being reminded by a smart speaker, but will get the meds and forget to take them when he returns to where he was prior to retrieving. 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   OBJECTIVE IMPAIRMENTS: include memory. These impairments are limiting patient from managing medications, managing  appointments, managing finances, household responsibilities, ADLs/IADLs, and effectively communicating at home and in community. Factors affecting potential to achieve goals and functional outcome are ability to learn/carryover information.. Patient will benefit from skilled SLP services to address above impairments and improve overall function.  REHAB POTENTIAL: Good  PLAN:  SLP FREQUENCY: 1-2x/week  SLP DURATION: 8 weeks  PLANNED INTERVENTIONS: 92507 Treatment of speech (30 or 45 min) , Environmental controls, Cognitive reorganization, Internal/external aids, Functional tasks, Multimodal communication approach, SLP instruction and feedback, Compensatory strategies, and Patient/family education    [facility], CCC-SLP 11/25/2023, 11:17 PM

## 2023-11-28 ENCOUNTER — Ambulatory Visit: Payer: PPO | Admitting: Dermatology

## 2023-11-28 DIAGNOSIS — L821 Other seborrheic keratosis: Secondary | ICD-10-CM

## 2023-11-28 DIAGNOSIS — D1801 Hemangioma of skin and subcutaneous tissue: Secondary | ICD-10-CM | POA: Diagnosis not present

## 2023-11-28 DIAGNOSIS — L578 Other skin changes due to chronic exposure to nonionizing radiation: Secondary | ICD-10-CM | POA: Diagnosis not present

## 2023-11-28 DIAGNOSIS — D229 Melanocytic nevi, unspecified: Secondary | ICD-10-CM

## 2023-11-28 DIAGNOSIS — L820 Inflamed seborrheic keratosis: Secondary | ICD-10-CM | POA: Diagnosis not present

## 2023-11-28 DIAGNOSIS — W908XXA Exposure to other nonionizing radiation, initial encounter: Secondary | ICD-10-CM

## 2023-11-28 DIAGNOSIS — L814 Other melanin hyperpigmentation: Secondary | ICD-10-CM

## 2023-11-28 DIAGNOSIS — Z86018 Personal history of other benign neoplasm: Secondary | ICD-10-CM

## 2023-11-28 DIAGNOSIS — Z872 Personal history of diseases of the skin and subcutaneous tissue: Secondary | ICD-10-CM

## 2023-11-28 DIAGNOSIS — Z1283 Encounter for screening for malignant neoplasm of skin: Secondary | ICD-10-CM | POA: Diagnosis not present

## 2023-11-28 NOTE — Patient Instructions (Addendum)

## 2023-11-28 NOTE — Progress Notes (Signed)
   Follow-Up Visit   Subjective  Eugene Martinez. is a 77 y.o. male who presents for the following: Skin Cancer Screening and Full Body Skin Exam  Hx of dysplastic nevi, hx of isks   The patient presents for Total-Body Skin Exam (TBSE) for skin cancer screening and mole check. The patient has spots, moles and lesions to be evaluated, some may be new or changing and the patient may have concern these could be cancer.    The following portions of the chart were reviewed this encounter and updated as appropriate: medications, allergies, medical history  Review of Systems:  No other skin or systemic complaints except as noted in HPI or Assessment and Plan.  Objective  Well appearing patient in no apparent distress; mood and affect are within normal limits.  A full examination was performed including scalp, head, eyes, ears, nose, lips, neck, chest, axillae, abdomen, back, buttocks, bilateral upper extremities, bilateral lower extremities, hands, feet, fingers, toes, fingernails, and toenails. All findings within normal limits unless otherwise noted below.   Relevant physical exam findings are noted in the Assessment and Plan.  left temple x 1 Erythematous stuck-on, waxy papule or plaque  Assessment & Plan   SKIN CANCER SCREENING PERFORMED TODAY.  ACTINIC DAMAGE - Chronic condition, secondary to cumulative UV/sun exposure - diffuse scaly erythematous macules with underlying dyspigmentation - Recommend daily broad spectrum sunscreen SPF 30+ to sun-exposed areas, reapply every 2 hours as needed.  - Staying in the shade or wearing long sleeves, sun glasses (UVA+UVB protection) and wide brim hats (4-inch brim around the entire circumference of the hat) are also recommended for sun protection.  - Call for new or changing lesions.  Vascular birthmark at back of neck  LENTIGINES, SEBORRHEIC KERATOSES, HEMANGIOMAS - Benign normal skin lesions - Benign-appearing - Call for any  changes  MELANOCYTIC NEVI - Tan-brown and/or pink-flesh-colored symmetric macules and papules - Benign appearing on exam today - Observation - Call clinic for new or changing moles - Recommend daily use of broad spectrum spf 30+ sunscreen to sun-exposed areas.   HISTORY OF DYSPLASTIC NEVUS Most recent 11/28/2022 right proximal lateral thigh - severe atypia excised 01/08/23  Other locations x 2 see history 2014 and 2019 No evidence of recurrence today Recommend regular full body skin exams Recommend daily broad spectrum sunscreen SPF 30+ to sun-exposed areas, reapply every 2 hours as needed.  Call if any new or changing lesions are noted between office visits     INFLAMED SEBORRHEIC KERATOSIS left temple x 1 Symptomatic, irritating, patient would like treated. Destruction of lesion - left temple x 1 Complexity: simple   Destruction method: cryotherapy   Informed consent: discussed and consent obtained   Timeout:  patient name, date of birth, surgical site, and procedure verified Lesion destroyed using liquid nitrogen: Yes   Region frozen until ice ball extended beyond lesion: Yes   Outcome: patient tolerated procedure well with no complications   Post-procedure details: wound care instructions given   Return in about 1 year (around 11/27/2024) for TBSE.  IAsher Muir, CMA, am acting as scribe for Armida Sans, MD.   Documentation: I have reviewed the above documentation for accuracy and completeness, and I agree with the above.  Armida Sans, MD

## 2023-11-29 ENCOUNTER — Encounter: Payer: Self-pay | Admitting: Physical Therapy

## 2023-11-29 ENCOUNTER — Other Ambulatory Visit: Payer: Self-pay

## 2023-11-29 ENCOUNTER — Ambulatory Visit: Payer: PPO | Attending: Student | Admitting: Physical Therapy

## 2023-11-29 ENCOUNTER — Ambulatory Visit: Payer: PPO | Admitting: Speech Pathology

## 2023-11-29 DIAGNOSIS — R41841 Cognitive communication deficit: Secondary | ICD-10-CM | POA: Diagnosis not present

## 2023-11-29 DIAGNOSIS — R2689 Other abnormalities of gait and mobility: Secondary | ICD-10-CM | POA: Insufficient documentation

## 2023-11-29 DIAGNOSIS — R2681 Unsteadiness on feet: Secondary | ICD-10-CM | POA: Insufficient documentation

## 2023-11-29 DIAGNOSIS — M6281 Muscle weakness (generalized): Secondary | ICD-10-CM | POA: Diagnosis not present

## 2023-11-29 NOTE — Therapy (Signed)
OUTPATIENT PHYSICAL THERAPY NEURO EVALUATION   Patient Name: Eugene Martinez. MRN: [id number] DOB:12/13/1945, 77 y.o., male Today's Date: 11/29/2023   PCP: Barbette Reichmann, MD REFERRING PROVIDER: Janice Coffin, PA-C   END OF SESSION:  PT End of Session - 11/29/23 0945     Visit Number 2    Number of Visits 13    Date for PT Re-Evaluation 01/10/24    Authorization Type HTA    Progress Note Due on Visit 10    PT Start Time 0945    PT Stop Time 1015    PT Time Calculation (min) 30 min    Equipment Utilized During Treatment --   will need gait belt due to fall risk   Activity Tolerance Patient tolerated treatment well    Behavior During Therapy WFL for tasks assessed/performed             Past Medical History:  Diagnosis Date   Allergic state    Amyloidosis (HCC)    Anemia    BPH (benign prostatic hyperplasia)    DDD (degenerative disc disease), lumbar    Dysplastic nevus 10/13/2013   Left posterior waistline paraspinal. Moderate to severe atypia, pattern borders on early evolving MIS, edge involved. Excised 03/31/2014, margins free.   Dysplastic nevus 12/19/2017   Right upper back medial. Mild atypia, lateral and deep margins involved.    Dysplastic nevus 11/28/2022   right prox lat thigh, Severe atypia, Excised 01/08/23   Dysrhythmia    Environmental and seasonal allergies    Gallop rhythm    GERD (gastroesophageal reflux disease)    Hypertension    Mild mitral regurgitation    a. by echo 10/2012   Pain    bil. 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 10/2012 Echo: EF 55-60%, severe LVH with speckling of myocardium -? amyloid. Mild MR, mod dil LA.   Past Surgical History:  Procedure Laterality Date   CIRCUMCISION  1984   COLONOSCOPY  03/2012   COLONOSCOPY WITH PROPOFOL N/A 10/29/2017   Procedure: COLONOSCOPY WITH PROPOFOL;  Surgeon: Christena Deem, MD;  Location: [facility] ENDOSCOPY;  Service: Endoscopy;  Laterality: N/A;   FOOT  SURGERY     right foot   FOOT SURGERY Right 2017   LEG SURGERIES     HX.OF MULTIPLE SURGERIES FOR LEG LENGTH DUE TO POLIO   LUMBAR LAMINECTOMY/DECOMPRESSION MICRODISCECTOMY N/A 01/26/2019   Procedure: LUMBAR LAMINECTOMY/DECOMPRESSION MICRODISCECTOMY 1 LEVEL L3-4;  Surgeon: Lucy Chris, MD;  Location: [facility] ORS;  Service: Neurosurgery;  Laterality: N/A;   NECK SURGERY     PILONIDAL CYST / SINUS EXCISION     Patient Active Problem List   Diagnosis Date Noted   Anemia 08/01/2020   Elevated hemoglobin A1c 08/01/2020   Loss of memory 08/01/2020   Sesamoiditis 06/25/2019   S/P lumbar laminectomy 01/26/2019   Weakness of both lower limbs 10/24/2018   LVH (left ventricular hypertrophy) 08/29/2016   Allergy 02/20/2016   Amyloidosis (HCC) 02/20/2016   Thrombocytopenia (HCC) 11/23/2015   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   Weight loss, unintentional 06/29/2013   Airway hyperreactivity 05/07/2013   Benign fibroma of prostate 05/07/2013   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   Chronic diastolic heart failure (HCC) 11/18/2012   Proteinuria, Bence Jones 11/18/2012   Multiple allergies 10/29/2012   Hypertension 10/29/2012   RBBB (right bundle branch block with left anterior fascicular block) 10/29/2012   Proteinuria 10/29/2012    ONSET DATE:  11/06/2023 (MD referral)  REFERRING DIAG: G31.84 (ICD-10-CM) - Mild cognitive impairment of uncertain or unknown etiology   THERAPY DIAG:  Cognitive communication deficit  Unsteadiness on feet  Other abnormalities of gait and mobility  Muscle weakness (generalized)  Rationale for Evaluation and Treatment: Rehabilitation  SUBJECTIVE:                                                                                                                                                                                             SUBJECTIVE STATEMENT:  Pt arrived late to PT treatment. [place], but was unable to find rehab  department for roughly  20 minutes. No pain reported at start of PT treatment.   Has been dx with MCI, fairly stable for the last 1-2 years.  PA suggested physical therapy, and she sent [place] here.  Use the cane in the house.  Wife notices that R side gets weaker, this is the side that was affected by polio.  No falls, but he is unsure. 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 No  PRECAUTIONS: Fall; wears R AFO  RED FLAGS: None   WEIGHT BEARING RESTRICTIONS: No  FALLS: Has patient fallen in last 6 months? 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   OBJECTIVE:  Note: Objective measures were completed at Evaluation unless otherwise noted.  DIAGNOSTIC FINDINGS: NA  COGNITION: Overall cognitive status: History of cognitive impairments - at baseline   SENSATION: Has numbness in toes  POSTURE: rounded shoulders, forward head, and R leg shorter than the L  LOWER EXTREMITY ROM:   Active ROM WFL BLEs, some limitation in R ankle due to weakness, but able to initiate ankle dorsiflexion   LOWER EXTREMITY MMT:    MMT Right Eval Left Eval  Hip flexion 4 4+  Hip extension    Hip abduction    Hip adduction    Hip internal rotation    Hip external rotation    Knee flexion  4 4+  Knee extension 4 4+  Ankle dorsiflexion     Ankle plantarflexion    Ankle inversion    Ankle eversion    (Blank rows = not tested)   TRANSFERS: Assistive device utilized: None  Sit to stand: SBA Stand to sit: SBA  GAIT: Gait pattern: step through pattern and wide BOS Distance walked: 50 ft x 2 Assistive device utilized: Quad cane small base Level of assistance: SBA Comments: Wears R AFO (hx of polio)  FUNCTIONAL TESTS:  5 times sit to stand: 12.69 sec Timed up and go (TUG): 20.79 sec (does not use cane) Berg Balance Scale: 35/56  10 meter walk:  21 sec (1.56 ft/sec)    TODAY'S TREATMENT:                                                                                                                              DATE: 11/25/2023    PATIENT EDUCATION: Education details: Eval results, POC, re-assessed HEP. Pt educated throughout session about proper posture and technique with exercises. Improved exercise technique, movement at target joints, use of target muscles after min to mod verbal, visual, tactile cues.  Person educated: Patient and Spouse Education method: Explanation, Demonstration, and Handouts Education comprehension: verbalized understanding and returned demonstration  HOME EXERCISE PROGRAM: Access Code: [email or web address] URL: [email or web address] Date: 11/25/2023 Prepared by: [facility] - Outpatient  Rehab - [facility]  Exercises - Single Leg Stance with Support  - 1 x daily - 5 x weekly - 1 sets - 3 reps - 10 sec hold - Standing Romberg to 1/2 Tandem Stance  - 1 x daily - 5 x weekly - 1 sets - 3 reps - 15 sec hold  GOALS: Goals reviewed with patient? Yes  SHORT TERM GOALS: Target date: 12/27/2023  Pt will be supervision with HEP for improved balance, gait. Baseline: given on 12/16 Goal status: INITIAL  2.  Pt will improve TUG score to less than or equal to 15 sec for decreased fall risk. Baseline: 20.79 sec Goal status: INITIAL  3.  Pt will improve Berg score to at least 40/56  to decrease fall risk. Baseline: 35/56 Goal status: INITIAL  4.  Pt/wife will verbalize understanding of fall prevention in home environment.  Baseline:  Goal status: INITIAL   LONG TERM GOALS: Target date: 01/10/2024  Pt will be supervision with progression of HEP for improved balance, gait. Baseline:  Goal status: INITIAL  2.  Pt will improve gait velocity to at least 1.8 ft/sec for improved gait efficiency and safety. Baseline: 1.56 ft/sec Goal status: INITIAL  3.  Pt will improve Berg score to at least 45/56 to decrease fall risk. Baseline: 35/56 Goal status: INITIAL  4.  Pt will perform floor>stand transfer with min assist for safe fall recovery  Baseline:  Wife reports she was unable to get him up on his own with fall 8 months ago  Goal status:  INITIAL  ASSESSMENT:  CLINICAL IMPRESSION: Patient is a 77 y.o. male with hx of post-polio who was seen today for physical therapy treatment following transfer to [facility] from [facility]. PT treatment focused on HEP review and expansion as well as dynamic gait training with UE support; tolerated well. Education for possible benefits of elevated walking cane. He will benefit from skilled PT to address balance, strength, gait speed and endurance for improved functional mobility and decreased fall risk.  OBJECTIVE IMPAIRMENTS: Abnormal gait, decreased balance, decreased mobility, difficulty walking, decreased strength, and postural dysfunction.   ACTIVITY LIMITATIONS: standing, squatting, transfers, and locomotion level  PARTICIPATION LIMITATIONS: community activity and church  PERSONAL FACTORS: 3+ comorbidities: see above PMH  are also affecting patient's functional outcome.   REHAB POTENTIAL: Good  CLINICAL DECISION MAKING: Evolving/moderate complexity  EVALUATION COMPLEXITY: Moderate  PLAN:  PT FREQUENCY: 1-2x/week  PT DURATION: 6 weeks plus eval visit  PLANNED INTERVENTIONS: 97110-Therapeutic exercises,  97530- Therapeutic activity, [id number]- Neuromuscular re-education, 97535- Self Care, 86578- Manual therapy, [phone]- Gait training, Patient/Family education, Balance training, and DME instructions  PLAN FOR NEXT SESSION:   Dynamic balance and gait training.    Golden Pop, PT 11/29/2023, 9:46 AM  [facility] at [facility] [address] [place], [address] [place], [place] [address] Phone # [phone] Fax # [phone]

## 2023-11-29 NOTE — Therapy (Signed)
OUTPATIENT SPEECH LANGUAGE PATHOLOGY  TREATMENT NOTE  Patient Name: Eugene Martinez. MRN: [id number] DOB:Sep 26, 1946, 77 y.o., male Today's Date: 11/29/2023  PCP: Barbette Reichmann, MD REFERRING PROVIDER: Janice Coffin, PA-C  END OF SESSION:  End of Session - 11/29/23 1015     Visit Number 2    Number of Visits 13    Date for SLP Re-Evaluation 01/31/24    Authorization Type [employer]    Progress Note Due on Visit 10    SLP Start Time 1015    SLP Stop Time  1100    SLP Time Calculation (min) 45 min    Activity Tolerance Patient tolerated treatment well             Past Medical History:  Diagnosis Date   Allergic state    Amyloidosis (HCC)    Anemia    BPH (benign prostatic hyperplasia)    DDD (degenerative disc disease), lumbar    Dysplastic nevus 10/13/2013   Left posterior waistline paraspinal. Moderate to severe atypia, pattern borders on early evolving MIS, edge involved. Excised 03/31/2014, margins free.   Dysplastic nevus 12/19/2017   Right upper back medial. Mild atypia, lateral and deep margins involved.    Dysplastic nevus 11/28/2022   right prox lat thigh, Severe atypia, Excised 01/08/23   Dysrhythmia    Environmental and seasonal allergies    Gallop rhythm    GERD (gastroesophageal reflux disease)    Hypertension    Mild mitral regurgitation    a. by echo 10/2012   Pain    bil. 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 10/2012 Echo: EF 55-60%, severe LVH with speckling of myocardium -? amyloid. Mild MR, mod dil LA.   Past Surgical History:  Procedure Laterality Date   CIRCUMCISION  1984   COLONOSCOPY  03/2012   COLONOSCOPY WITH PROPOFOL N/A 10/29/2017   Procedure: COLONOSCOPY WITH PROPOFOL;  Surgeon: Christena Deem, MD;  Location: [facility] ENDOSCOPY;  Service: Endoscopy;  Laterality: N/A;   FOOT SURGERY     right foot   FOOT SURGERY Right 2017   LEG SURGERIES     HX.OF MULTIPLE SURGERIES FOR LEG LENGTH DUE TO  POLIO   LUMBAR LAMINECTOMY/DECOMPRESSION MICRODISCECTOMY N/A 01/26/2019   Procedure: LUMBAR LAMINECTOMY/DECOMPRESSION MICRODISCECTOMY 1 LEVEL L3-4;  Surgeon: Lucy Chris, MD;  Location: [facility] ORS;  Service: Neurosurgery;  Laterality: N/A;   NECK SURGERY     PILONIDAL CYST / SINUS EXCISION     Patient Active Problem List   Diagnosis Date Noted   Anemia 08/01/2020   Elevated hemoglobin A1c 08/01/2020   Loss of memory 08/01/2020   Sesamoiditis 06/25/2019   S/P lumbar laminectomy 01/26/2019   Weakness of both lower limbs 10/24/2018   LVH (left ventricular hypertrophy) 08/29/2016   Allergy 02/20/2016   Amyloidosis (HCC) 02/20/2016   Thrombocytopenia (HCC) 11/23/2015   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   Weight loss, unintentional 06/29/2013   Airway hyperreactivity 05/07/2013   Benign fibroma of prostate 05/07/2013   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 PMH of chemotherapy (2014), and polio (1954). Other PMH Primary hypertension  Elevated hemoglobin A1c  Thrombocytopenia (CMS-HCC)  Post-polio syndrome (CMS-HCC)  Other amyloidosis (CMS/HHS-HCC)  Mild intermittent asthma without complication (HHS-HCC)  Chronic diastolic heart failure (CMS/HHS-HCC  PAIN:  Are you having pain? No  FALLS: Has patient fallen in last 6 months?  See PT evaluation for details  LIVING ENVIRONMENT: Lives with: lives with their spouse Lives in:  House/apartment  PLOF:  Level of assistance: Comment: needs assistance with tasks that involve memory Employment: Retired  PATIENT GOALS: improve memory skills  SUBJECTIVE STATEMENT: Pt pleasant, eager to participate Pt accompanied by: significant other  OBJECTIVE:  TODAY'S TREATMENT:                                                                                                                                         DATE:  Skilled treatment session focused on pt's cognitive communication goals: SLP facilitated session by providing the following interventions:  Addenbrooke's Cognitive Examination - ACE III The Addenbrooke's Cognitive Examination-III (ACE-III) is a brief cognitive test that assesses five cognitive domains. The total score is 100 with higher scores indicating better cognitive functioning. Cut off scores of 88 and 82 are recommended for suspicion of dementia (88 has sensitivity of 1.00 and specificity of 0.96, 82 has sensitivity of 0.93 and specificity of 1.00). American Version A  Attention 16/18  Memory 10/26  Fluency 4/14  Language 23/26  Visuospatial 12/16  TOTAL ACE- III Score 65/100     PATIENT EDUCATION: Education details: see "today's treatment" above Person educated: Patient and Spouse Education method: Explanation, Demonstration, and Handouts Education comprehension: verbalized understanding and needs further education   GOALS: Goals reviewed with patient? Yes  SHORT TERM GOALS: Target date: 01/03/24  Pt will demo knowledge that he needs to use practical compensation in a situation which has been difficult in the past, x2 sessions Baseline: Goal status: INITIAL  2.  Pt and/or wife will develop a medication administration system  Baseline:  Goal status: INITIAL  3.  Pt will make it on time to 100% of appointments in a 2-week period prior to 01/03/24 using compensations Baseline:  Goal status: INITIAL  4.  Pt will complete a financial task  using a memory compensation system prior to 01/03/24 Baseline:  Goal status: INITIAL   LONG TERM GOALS: Target date: 01/31/24  Pt will improve PROM compared to initial administration Baseline:  Goal status: INITIAL  2.  Pt will successfully use a medication administration system for two weeks prior to 01/31/24 Baseline:  Goal status: INITIAL  3.  Pt will make it on time to 100% of appointments in a 2-week period  after 01/03/24 using compensations Baseline:  Goal status: INITIAL  4.  Pt will complete a financial responsibility  using a memory compensation system, between two sessions Baseline:  Goal status: INITIAL   ASSESSMENT:  CLINICAL IMPRESSION: Patient is a 78 y.o. M  who was seen today for cognitive communication deficit. APt presents with a moderate primarily amnestic cognitive impairment.   OBJECTIVE IMPAIRMENTS: include memory. These impairments are limiting patient from managing medications, managing appointments, managing finances, household responsibilities, ADLs/IADLs, and effectively communicating at home and in community. Factors affecting potential to achieve goals and functional outcome are ability to learn/carryover information.. Patient will benefit from skilled SLP services to address above impairments and improve overall function.  REHAB POTENTIAL: Good  PLAN:  SLP FREQUENCY: 1-2x/week  SLP DURATION: 8 weeks  PLANNED INTERVENTIONS: 92507 Treatment of speech (30 or 45 min) , Environmental controls, Cognitive reorganization, Internal/external aids, Functional tasks, Multimodal communication approach, SLP instruction and feedback, Compensatory strategies, and Patient/family education    Avanni Turnbaugh, CCC-SLP 11/29/2023, 11:06 AM

## 2023-12-02 ENCOUNTER — Ambulatory Visit: Payer: PPO | Admitting: Physical Therapy

## 2023-12-02 ENCOUNTER — Ambulatory Visit: Payer: PPO | Admitting: Speech Pathology

## 2023-12-02 DIAGNOSIS — R2689 Other abnormalities of gait and mobility: Secondary | ICD-10-CM

## 2023-12-02 DIAGNOSIS — M6281 Muscle weakness (generalized): Secondary | ICD-10-CM

## 2023-12-02 DIAGNOSIS — R2681 Unsteadiness on feet: Secondary | ICD-10-CM

## 2023-12-02 DIAGNOSIS — R41841 Cognitive communication deficit: Secondary | ICD-10-CM | POA: Diagnosis not present

## 2023-12-02 NOTE — Therapy (Signed)
OUTPATIENT SPEECH LANGUAGE PATHOLOGY  TREATMENT NOTE  Patient Name: Eugene Martinez. MRN: [id number] DOB:11-30-1946, 77 y.o., male Today's Date: 12/02/2023  PCP: Barbette Reichmann, MD REFERRING PROVIDER: Janice Coffin, PA-C  END OF SESSION:  End of Session - 12/02/23 1549     Visit Number 3    Number of Visits 13    Date for SLP Re-Evaluation 01/31/24    Authorization Type [employer]    Progress Note Due on Visit 10    SLP Start Time 1442    SLP Stop Time  1533    SLP Time Calculation (min) 51 min    Activity Tolerance Patient tolerated treatment well             Past Medical History:  Diagnosis Date   Allergic state    Amyloidosis (HCC)    Anemia    BPH (benign prostatic hyperplasia)    DDD (degenerative disc disease), lumbar    Dysplastic nevus 10/13/2013   Left posterior waistline paraspinal. Moderate to severe atypia, pattern borders on early evolving MIS, edge involved. Excised 03/31/2014, margins free.   Dysplastic nevus 12/19/2017   Right upper back medial. Mild atypia, lateral and deep margins involved.    Dysplastic nevus 11/28/2022   right prox lat thigh, Severe atypia, Excised 01/08/23   Dysrhythmia    Environmental and seasonal allergies    Gallop rhythm    GERD (gastroesophageal reflux disease)    Hypertension    Mild mitral regurgitation    a. by echo 10/2012   Pain    bil. 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 10/2012 Echo: EF 55-60%, severe LVH with speckling of myocardium -? amyloid. Mild MR, mod dil LA.   Past Surgical History:  Procedure Laterality Date   CIRCUMCISION  1984   COLONOSCOPY  03/2012   COLONOSCOPY WITH PROPOFOL N/A 10/29/2017   Procedure: COLONOSCOPY WITH PROPOFOL;  Surgeon: Christena Deem, MD;  Location: [facility] ENDOSCOPY;  Service: Endoscopy;  Laterality: N/A;   FOOT SURGERY     right foot   FOOT SURGERY Right 2017   LEG SURGERIES     HX.OF MULTIPLE SURGERIES FOR LEG LENGTH DUE TO  POLIO   LUMBAR LAMINECTOMY/DECOMPRESSION MICRODISCECTOMY N/A 01/26/2019   Procedure: LUMBAR LAMINECTOMY/DECOMPRESSION MICRODISCECTOMY 1 LEVEL L3-4;  Surgeon: Lucy Chris, MD;  Location: [facility] ORS;  Service: Neurosurgery;  Laterality: N/A;   NECK SURGERY     PILONIDAL CYST / SINUS EXCISION     Patient Active Problem List   Diagnosis Date Noted   Anemia 08/01/2020   Elevated hemoglobin A1c 08/01/2020   Loss of memory 08/01/2020   Sesamoiditis 06/25/2019   S/P lumbar laminectomy 01/26/2019   Weakness of both lower limbs 10/24/2018   LVH (left ventricular hypertrophy) 08/29/2016   Allergy 02/20/2016   Amyloidosis (HCC) 02/20/2016   Thrombocytopenia (HCC) 11/23/2015   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   Weight loss, unintentional 06/29/2013   Airway hyperreactivity 05/07/2013   Benign fibroma of prostate 05/07/2013   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 PMH of chemotherapy (2014), and polio (1954). Other PMH Primary hypertension  Elevated hemoglobin A1c  Thrombocytopenia (CMS-HCC)  Post-polio syndrome (CMS-HCC)  Other amyloidosis (CMS/HHS-HCC)  Mild intermittent asthma without complication (HHS-HCC)  Chronic diastolic heart failure (CMS/HHS-HCC  PAIN:  Are you having pain? No  FALLS: Has patient fallen in last 6 months?  See PT evaluation for details  LIVING ENVIRONMENT: Lives with: lives with their spouse Lives in:  House/apartment  PLOF:  Level of assistance: Comment: needs assistance with tasks that involve memory Employment: Retired  PATIENT GOALS: improve memory skills  SUBJECTIVE STATEMENT: Pt pleasant, eager to participate Pt accompanied by: significant other  OBJECTIVE:  TODAY'S TREATMENT:                                                                                                                                         DATE:  Skilled treatment session focused on pt's cognitive communication goals: SLP facilitated session by providing the following interventions:  Administered Neuro-QOL as patient reported outcome measure for cognitive functioning. The Neuro-QOLT Item Bank v2.0-Cognition Function-Short Form is an eight-item test designed to measure difficulties with cognitive functioning (e.g., memory, attention and decision making or in the application of such abilities to everyday tasks (e.g., planning, organizing, calculating, remembering and learning). Source: Duwaine Maxin, J-S, et al. (2012). Neuro-QOL: brief measures of health-related quality of life for clinical research in neurology. Neurology, 78(23), [phone].   In the past 7 days...  I had to read something several times to understand it. 4- Rarely (once)  My thinking was slow. 2- Often (once a day)  I had to work really hard to pay attention or I would make a mistake. 1- Very Often (several times a day)  I had trouble concentrating. 1- Very Often (several times a day)   How much DIFFICULTY do you currently have...  Reading and following complex instructions (e.g., directions for a new medication)? 2- A lot  Planning for and keeping appointments that are not part of your weekly routine (e.g., a therapy or doctor appointment, or a social gathering with friends and family)? 3- Somewhat  Managing your time to do most of your daily activities? 5- None  Learning new tasks or instructions? 3- Somewhat   T-SCORE: 36; 2.6 SD below  mean of 50  Introduced compensatory strategies and use of daily to do list, provided guidance for making to do list and notebook for patient and spouse to begin to implement daily. Educated that spouse should be involved but allow patient to write.     PATIENT EDUCATION: Education details: see "today's treatment" above Person educated: Patient and Spouse Education method: Explanation, Demonstration, and Handouts Education comprehension: verbalized understanding and needs further education   GOALS: Goals reviewed with patient? Yes  SHORT TERM GOALS:  Target date: 01/03/24  Pt will demo knowledge that he needs to use practical compensation in a situation which has been difficult in the past, x2 sessions Baseline: Goal status: INITIAL  2.  Pt and/or wife will develop a medication administration system  Baseline:  Goal status: INITIAL  3.  Pt will make it on time to 100% of appointments in a 2-week period prior to 01/03/24 using compensations Baseline:  Goal status: INITIAL  4.  Pt will complete a financial task using a memory compensation system prior to 01/03/24 Baseline:  Goal status: INITIAL   LONG TERM GOALS: Target date: 01/31/24  Pt will improve PROM compared to initial administration Baseline:  Goal status: INITIAL  2.  Pt will successfully use a medication administration system for two weeks prior to 01/31/24 Baseline:  Goal status: INITIAL  3.  Pt will make it on time to 100% of appointments in a 2-week period  after 01/03/24 using compensations Baseline:  Goal status: INITIAL  4.  Pt will complete a financial responsibility using a memory compensation system, between two sessions Baseline:  Goal status: INITIAL   ASSESSMENT:  CLINICAL IMPRESSION: Patient is a 77 y.o. M  who was seen today for cognitive communication deficit. Pt presents with a moderate primarily amnestic cognitive impairment. He was able to offer several examples of how his deficits are  interfering with day to day activities and was receptive to discuss of using strategies to improve his functioning.   OBJECTIVE IMPAIRMENTS: include memory. These impairments are limiting patient from managing medications, managing appointments, managing finances, household responsibilities, ADLs/IADLs, and effectively communicating at home and in community. Factors affecting potential to achieve goals and functional outcome are ability to learn/carryover information.. Patient will benefit from skilled SLP services to address above impairments and improve overall function.  REHAB POTENTIAL: Good  PLAN:  SLP FREQUENCY: 1-2x/week  SLP DURATION: 8 weeks  PLANNED INTERVENTIONS: 92507 Treatment of speech (30 or 45 min) , Environmental controls, Cognitive reorganization, Internal/external aids, Functional tasks, Multimodal communication approach, SLP instruction and feedback, Compensatory strategies, and Patient/family education   Rondel Baton, MS, CCC-SLP Speech-Language Pathologist [phone]   Arlana Lindau, CCC-SLP 12/02/2023, 3:50 PM

## 2023-12-02 NOTE — Therapy (Unsigned)
OUTPATIENT PHYSICAL THERAPY NEURO EVALUATION   Patient Name: Eugene Martinez. MRN: [id number] DOB:10/16/1946, 77 y.o., male Today's Date: 12/02/2023   PCP: Eugene Reichmann, MD REFERRING PROVIDER: Janice Coffin, PA-C   END OF SESSION:  PT End of Session - 12/02/23 1535     Visit Number 3    Number of Visits 13    Date for PT Re-Evaluation 01/10/24    Authorization Type HTA    Progress Note Due on Visit 10    PT Start Time 1538    PT Stop Time 1615    PT Time Calculation (min) 37 min    Equipment Utilized During Treatment --   will need gait belt due to fall risk   Activity Tolerance Patient tolerated treatment well    Behavior During Therapy WFL for tasks assessed/performed             Past Medical History:  Diagnosis Date   Allergic state    Amyloidosis (HCC)    Anemia    BPH (benign prostatic hyperplasia)    DDD (degenerative disc disease), lumbar    Dysplastic nevus 10/13/2013   Left posterior waistline paraspinal. Moderate to severe atypia, pattern borders on early evolving MIS, edge involved. Excised 03/31/2014, margins free.   Dysplastic nevus 12/19/2017   Right upper back medial. Mild atypia, lateral and deep margins involved.    Dysplastic nevus 11/28/2022   right prox lat thigh, Severe atypia, Excised 01/08/23   Dysrhythmia    Environmental and seasonal allergies    Gallop rhythm    GERD (gastroesophageal reflux disease)    Hypertension    Mild mitral regurgitation    a. by echo 10/2012   Pain    bil. 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 10/2012 Echo: EF 55-60%, severe LVH with speckling of myocardium -? amyloid. Mild MR, mod dil LA.   Past Surgical History:  Procedure Laterality Date   CIRCUMCISION  1984   COLONOSCOPY  03/2012   COLONOSCOPY WITH PROPOFOL N/A 10/29/2017   Procedure: COLONOSCOPY WITH PROPOFOL;  Surgeon: Eugene Deem, MD;  Location[facility] ENDOSCOPY;  Service: Endoscopy;  Laterality: N/A;   FOOT  SURGERY     right foot   FOOT SURGERY Right 2017   LEG SURGERIES     HX.OF MULTIPLE SURGERIES FOR LEG LENGTH DUE TO POLIO   LUMBAR LAMINECTOMY/DECOMPRESSION MICRODISCECTOMY N/A 01/26/2019   Procedure: LUMBAR LAMINECTOMY/DECOMPRESSION MICRODISCECTOMY 1 LEVEL L3-4;  Surgeon: Eugene Chris, MD;  Location: [facility] ORS;  Service: Neurosurgery;  Laterality: N/A;   NECK SURGERY     PILONIDAL CYST / SINUS EXCISION     Patient Active Problem List   Diagnosis Date Noted   Anemia 08/01/2020   Elevated hemoglobin A1c 08/01/2020   Loss of memory 08/01/2020   Sesamoiditis 06/25/2019   S/P lumbar laminectomy 01/26/2019   Weakness of both lower limbs 10/24/2018   LVH (left ventricular hypertrophy) 08/29/2016   Allergy 02/20/2016   Amyloidosis (HCC) 02/20/2016   Thrombocytopenia (HCC) 11/23/2015   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   Weight loss, unintentional 06/29/2013   Airway hyperreactivity 05/07/2013   Benign fibroma of prostate 05/07/2013   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   Chronic diastolic heart failure (HCC) 11/18/2012   Proteinuria, Bence Jones 11/18/2012   Multiple allergies 10/29/2012   Hypertension 10/29/2012   RBBB (right bundle branch block with left anterior fascicular block) 10/29/2012   Proteinuria 10/29/2012    ONSET DATE:  11/06/2023 (MD referral)  REFERRING DIAG: G31.84 (ICD-10-CM) - Mild cognitive impairment of uncertain or unknown etiology   THERAPY DIAG:  Unsteadiness on feet  Other abnormalities of gait and mobility  Muscle weakness (generalized)  Rationale for Evaluation and Treatment: Rehabilitation  SUBJECTIVE:                                                                                                                                                                                             SUBJECTIVE STATEMENT:  Pt reports to PT following SLP treatment. No pain reported. Wife states that he was walking better over the weekend when he forgot his cane  at church.   Has been dx with MCI, fairly stable for the last 1-2 years.  PA suggested physical therapy, and she sent [place] here.  Use the cane in the house.  Wife notices that R side gets weaker, this is the side that was affected by polio.  No falls, but he is unsure. 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 No  PRECAUTIONS: Fall; wears R AFO  RED FLAGS: None   WEIGHT BEARING RESTRICTIONS: No  FALLS: Has patient fallen in last 6 months? 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   OBJECTIVE:  Note: Objective measures were completed at Evaluation unless otherwise noted.  DIAGNOSTIC FINDINGS: NA  COGNITION: Overall cognitive status: History of cognitive impairments - at baseline   SENSATION: Has numbness in toes  POSTURE: rounded shoulders, forward head, and R leg shorter than the L  LOWER EXTREMITY ROM:   Active ROM WFL BLEs, some limitation in R ankle due to weakness, but able to initiate ankle dorsiflexion   LOWER EXTREMITY MMT:    MMT Right Eval Left Eval  Hip flexion 4 4+  Hip extension    Hip abduction    Hip adduction    Hip internal rotation    Hip external rotation    Knee flexion 4 4+  Knee extension 4 4+  Ankle dorsiflexion  Ankle plantarflexion    Ankle inversion    Ankle eversion     (Blank rows = not tested)   TRANSFERS: Assistive device utilized: None  Sit to stand: SBA Stand to sit: SBA  GAIT: Gait pattern: step through pattern and wide BOS Distance walked: 50 ft x 2 Assistive device utilized: Quad cane small base Level of assistance: SBA Comments: Wears R AFO (hx of polio)  FUNCTIONAL TESTS:  5 times sit to stand: 12.69 sec Timed up and go (TUG): 20.79 sec (does not use cane) Berg Balance Scale: 35/56  10 meter walk:  21 sec (1.56 ft/sec)    TODAY'S TREATMENT:                                                                                                                              DATE: 11/25/2023   Nustep level 1 x 1.5 min  Level 4 x 4.5 min  Level 1 x 1 min  Cues from PT for consistent steps per min through varied resistance   Single limb step over pipe in floor x 10 bil  Lateral step over pipe in floor x 10 bil   Gait with SPC x 153ft. PT Adjusted QC and additional gait with QC x 124ft to lower handle. Mild improvement in AD management in turns.   Weighted gait training 4# AW with QC x 150. Min cues for step length.   Sit<>stand x 10 without UE support  Standing hip abduction 4# AW x 10  Standing hip flexion 4# AWx 10  Standing HS curl 4# AW x 10   Forward/reverse gait with QC 2 x 44ft   CGA for safety with dynamic gait and min cues for step height and length on the RLE with gait training.  min cues for posture and reduced compensation through trunk for BLE strengthening.   PATIENT EDUCATION: Education details: Eval results, POC, re-assessed HEP. Pt educated throughout session about proper posture and technique with exercises. Improved exercise technique, movement at target joints, use of target muscles after min to mod verbal, visual, tactile cues.  Person educated: Patient and Spouse Education method: Explanation, Demonstration, and Handouts Education comprehension: verbalized understanding and returned demonstration  HOME  EXERCISE PROGRAM: Access Code: [email or web address] URL: [email or web address] Date: 11/25/2023 Prepared by: [facility] - Outpatient  Rehab - [facility]  Exercises - Single Leg Stance with Support  - 1 x daily - 5 x weekly - 1 sets - 3 reps - 10 sec hold - Standing Romberg to 1/2 Tandem Stance  - 1 x daily - 5 x weekly - 1 sets - 3 reps - 15 sec hold  GOALS: Goals reviewed with patient? Yes  SHORT TERM GOALS: Target date: 12/27/2023  Pt will be supervision with HEP for improved balance, gait. Baseline: given on 12/16 Goal status: INITIAL  2.  Pt will improve TUG score to less than or equal to 15 sec for decreased fall risk. Baseline: 20.79  sec Goal status: INITIAL  3.  Pt will improve Berg score to at least 40/56 to decrease fall risk. Baseline: 35/56 Goal status: INITIAL  4.  Pt/wife will verbalize understanding of fall prevention in home environment.  Baseline:  Goal status: INITIAL   LONG TERM GOALS: Target date: 01/10/2024  Pt will be supervision with progression of HEP for improved balance, gait. Baseline:  Goal status: INITIAL  2.  Pt will improve gait velocity to at least 1.8 ft/sec for improved gait efficiency and safety. Baseline: 1.56 ft/sec Goal status: INITIAL  3.  Pt will improve Berg score to at least 45/56 to decrease fall risk. Baseline: 35/56 Goal status: INITIAL  4.  Pt will perform floor>stand transfer with min assist for safe fall recovery  Baseline:  Wife reports she was unable to get him up on his own with fall 8 months ago  Goal status:  INITIAL   ASSESSMENT:  CLINICAL IMPRESSION: Patient is a 77 y.o. male with hx of post-polio who was seen today for physical therapy treatment. PT treatment focused on weighted gait training to address shuffling gait pattern BLE weakness. Tolerated intervention well and improved step height with increased step height with increased repetitions.  He will benefit from skilled PT to address balance,  strength, gait speed and endurance for improved functional mobility and decreased fall risk.  OBJECTIVE IMPAIRMENTS: Abnormal gait, decreased balance, decreased mobility, difficulty walking, decreased strength, and postural dysfunction.   ACTIVITY LIMITATIONS: standing, squatting, transfers, and locomotion level  PARTICIPATION LIMITATIONS: community activity and church  PERSONAL FACTORS: 3+ comorbidities: see above PMH  are also affecting patient's functional outcome.   REHAB POTENTIAL: Good  CLINICAL DECISION MAKING: Evolving/moderate complexity  EVALUATION COMPLEXITY: Moderate  PLAN:  PT FREQUENCY: 1-2x/week  PT DURATION: 6 weeks plus eval visit  PLANNED INTERVENTIONS: 97110-Therapeutic exercises, 97530- Therapeutic activity, [id number]- Neuromuscular re-education, 97535- Self Care, [id number]- Manual therapy, [phone]- Gait training, Patient/Family education, Balance training, and DME instructions  PLAN FOR NEXT SESSION:   Dynamic balance and gait training. BLE strengthening.    Grier Rocher PT, DPT  Physical Therapist - [place]  [facility]  10:55 AM 12/03/23

## 2023-12-05 ENCOUNTER — Ambulatory Visit: Payer: PPO | Admitting: Speech Pathology

## 2023-12-05 ENCOUNTER — Ambulatory Visit: Payer: PPO | Admitting: Physical Therapy

## 2023-12-05 DIAGNOSIS — R2681 Unsteadiness on feet: Secondary | ICD-10-CM

## 2023-12-05 DIAGNOSIS — R41841 Cognitive communication deficit: Secondary | ICD-10-CM

## 2023-12-05 DIAGNOSIS — R2689 Other abnormalities of gait and mobility: Secondary | ICD-10-CM

## 2023-12-05 DIAGNOSIS — M6281 Muscle weakness (generalized): Secondary | ICD-10-CM

## 2023-12-05 NOTE — Therapy (Signed)
OUTPATIENT PHYSICAL THERAPY NEURO EVALUATION   Patient Name: Eugene Martinez. MRN: [id number] DOB:1946/10/04, 77 y.o., male Today's Date: 12/05/2023   PCP: Barbette Reichmann, MD REFERRING PROVIDER: Janice Coffin, PA-C   END OF SESSION:  PT End of Session - 12/05/23 1400     Visit Number 4    Number of Visits 13    Date for PT Re-Evaluation 01/10/24    Authorization Type HTA    Progress Note Due on Visit 10    PT Start Time 1400    PT Stop Time 1440    PT Time Calculation (min) 40 min    Equipment Utilized During Treatment --   will need gait belt due to fall risk   Activity Tolerance Patient tolerated treatment well    Behavior During Therapy WFL for tasks assessed/performed             Past Medical History:  Diagnosis Date   Allergic state    Amyloidosis (HCC)    Anemia    BPH (benign prostatic hyperplasia)    DDD (degenerative disc disease), lumbar    Dysplastic nevus 10/13/2013   Left posterior waistline paraspinal. Moderate to severe atypia, pattern borders on early evolving MIS, edge involved. Excised 03/31/2014, margins free.   Dysplastic nevus 12/19/2017   Right upper back medial. Mild atypia, lateral and deep margins involved.    Dysplastic nevus 11/28/2022   right prox lat thigh, Severe atypia, Excised 01/08/23   Dysrhythmia    Environmental and seasonal allergies    Gallop rhythm    GERD (gastroesophageal reflux disease)    Hypertension    Mild mitral regurgitation    a. by echo 10/2012   Pain    bil. 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 10/2012 Echo: EF 55-60%, severe LVH with speckling of myocardium -? amyloid. Mild MR, mod dil LA.   Past Surgical History:  Procedure Laterality Date   CIRCUMCISION  1984   COLONOSCOPY  03/2012   COLONOSCOPY WITH PROPOFOL N/A 10/29/2017   Procedure: COLONOSCOPY WITH PROPOFOL;  Surgeon: Christena Deem, MD;  Location: [facility] ENDOSCOPY;  Service: Endoscopy;  Laterality: N/A;   FOOT  SURGERY     right foot   FOOT SURGERY Right 2017   LEG SURGERIES     HX.OF MULTIPLE SURGERIES FOR LEG LENGTH DUE TO POLIO   LUMBAR LAMINECTOMY/DECOMPRESSION MICRODISCECTOMY N/A 01/26/2019   Procedure: LUMBAR LAMINECTOMY/DECOMPRESSION MICRODISCECTOMY 1 LEVEL L3-4;  Surgeon: Lucy Chris, MD;  Location: [facility] ORS;  Service: Neurosurgery;  Laterality: N/A;   NECK SURGERY     PILONIDAL CYST / SINUS EXCISION     Patient Active Problem List   Diagnosis Date Noted   Anemia 08/01/2020   Elevated hemoglobin A1c 08/01/2020   Loss of memory 08/01/2020   Sesamoiditis 06/25/2019   S/P lumbar laminectomy 01/26/2019   Weakness of both lower limbs 10/24/2018   LVH (left ventricular hypertrophy) 08/29/2016   Allergy 02/20/2016   Amyloidosis (HCC) 02/20/2016   Thrombocytopenia (HCC) 11/23/2015   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   Weight loss, unintentional 06/29/2013   Airway hyperreactivity 05/07/2013   Benign fibroma of prostate 05/07/2013   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   Chronic diastolic heart failure (HCC) 11/18/2012   Proteinuria, Bence Jones 11/18/2012   Multiple allergies 10/29/2012   Hypertension 10/29/2012   RBBB (right bundle branch block with left anterior fascicular block) 10/29/2012   Proteinuria 10/29/2012    ONSET DATE:  11/06/2023 (MD referral)  REFERRING DIAG: G31.84 (ICD-10-CM) - Mild cognitive impairment of uncertain or unknown etiology   THERAPY DIAG:  Cognitive communication deficit  Unsteadiness on feet  Other abnormalities of gait and mobility  Muscle weakness (generalized)  Rationale for Evaluation and Treatment: Rehabilitation  SUBJECTIVE:                                                                                                                                                                                             SUBJECTIVE STATEMENT:  Pt reports to PT following SLP treatment. No pain reported. Daughter present throughout session. No  updates on this day.    Has been dx with MCI, fairly stable for the last 1-2 years.  PA suggested physical therapy, and she sent [place] here.  Use the cane in the house.  Wife notices that R side gets weaker, this is the side that was affected by polio.  No falls, but he is unsure. 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 No  PRECAUTIONS: Fall; wears R AFO  RED FLAGS: None   WEIGHT BEARING RESTRICTIONS: No  FALLS: Has patient fallen in last 6 months? No *Wife reports fall 8 months ago and wife couldn't get him up*  LIVING ENVIRONMENT: Lives with: lives with their spouse Lives in: House/apartment Stairs:  1 step into home Has following equipment at home: Counselling psychologist and Environmental consultant - 2 wheeled *On Arts development officer for [employer] PLOF: Independent with household mobility with device and Independent with community mobility with device Enjoys yardwork, no longer does this  PATIENT GOALS: Pt's goals for therapy are to walk without falling.   OBJECTIVE:  Note: Objective measures were completed at Evaluation unless otherwise noted.  DIAGNOSTIC FINDINGS: NA  COGNITION: Overall cognitive status: History of cognitive impairments - at baseline   SENSATION: Has numbness in toes  POSTURE: rounded shoulders, forward head, and R leg shorter than the L  LOWER EXTREMITY ROM:   Active ROM WFL BLEs, some limitation in R ankle due to weakness, but able to initiate ankle dorsiflexion   LOWER EXTREMITY MMT:    MMT Right Eval Left Eval  Hip flexion 4 4+  Hip extension    Hip abduction    Hip adduction    Hip internal rotation    Hip external rotation    Knee flexion 4 4+  Knee extension 4 4+  Ankle dorsiflexion    Ankle  plantarflexion    Ankle inversion    Ankle  eversion    (Blank rows = not tested)   TRANSFERS: Assistive device utilized: None  Sit to stand: SBA Stand to sit: SBA  GAIT: Gait pattern: step through pattern and wide BOS Distance walked: 50 ft x 2 Assistive device utilized: Quad cane small base Level of assistance: SBA Comments: Wears R AFO (hx of polio)  FUNCTIONAL TESTS:  5 times sit to stand: 12.69 sec Timed up and go (TUG): 20.79 sec (does not use cane) Berg Balance Scale: 35/56  10 meter walk:  21 sec (1.56 ft/sec)    TODAY'S TREATMENT:                                                                                                                              DATE: 11/25/2023   Nustep level 1 x 1.5 min  Level 4 x 5 min  Level 1 x 1 min  Cues from PT for consistent steps per min through varied resistance   Standing on airex pad no UE support 4 x 30 sec; constant posterior LOB. RUE supported on QC 2x 30 sec, mildly improve anterior weight shift with UE supported on QC  Foot tap on 4 inch step x 10 bil with UE support on QC. X 10 bil without UE support   Standing therex: at rail Hip abduction x 10 bil  Partial squat x 10  Side stepping 75ft x 2 bil   Forward/ reverse x 4 each  HS curl x 12   Gait without UE support 2 x 67ft. And additional gait with QC. X 135ft.  Reduced shiffing pattern on second bout.   CGA for safety with dynamic gait and min cues for step height and length on the RLE with gait training.  min cues for posture and reduced compensation through trunk for BLE strengthening.   PATIENT EDUCATION: Education details: Pt educated throughout session about proper posture and technique with exercises. Improved exercise technique, movement at target joints, use of target muscles after min to mod verbal, visual, tactile cues.   Person educated: Patient and Spouse Education method: Explanation, Demonstration, and Handouts Education comprehension: verbalized understanding and returned  demonstration  HOME EXERCISE PROGRAM: Access Code: [email or web address] URL: [email or web address] Date: 11/25/2023 Prepared by: [facility] - Outpatient  Rehab - [facility]  Exercises - Single Leg Stance with Support  - 1 x daily - 5 x weekly - 1 sets - 3 reps - 10 sec hold - Standing Romberg to 1/2 Tandem Stance  - 1 x daily - 5 x weekly - 1 sets - 3 reps - 15 sec hold  GOALS: Goals reviewed with patient? Yes  SHORT TERM GOALS: Target date: 12/27/2023  Pt will be supervision with HEP for improved balance, gait. Baseline: given on 12/16 Goal status: INITIAL  2.  Pt will improve TUG score to less than or equal to 15 sec for decreased fall risk.  Baseline: 20.79 sec Goal status: INITIAL  3.  Pt will improve Berg score to at least 40/56 to decrease fall risk. Baseline: 35/56 Goal status: INITIAL  4.  Pt/wife will verbalize understanding of fall prevention in home environment.  Baseline:  Goal status: INITIAL   LONG TERM GOALS: Target date: 01/10/2024  Pt will be supervision with progression of HEP for improved balance, gait. Baseline:  Goal status: INITIAL  2.  Pt will improve gait velocity to at least 1.8 ft/sec for improved gait efficiency and safety. Baseline: 1.56 ft/sec Goal status: INITIAL  3.  Pt will improve Berg score to at least 45/56 to decrease fall risk. Baseline: 35/56 Goal status: INITIAL  4.  Pt will perform floor>stand transfer with min assist for safe fall recovery  Baseline:  Wife reports she was unable to get him up on his own with fall 8 months ago  Goal status:  INITIAL   ASSESSMENT:  CLINICAL IMPRESSION: Patient is a 77 y.o. male with hx of post-polio who was seen today for physical therapy treatment. BLE strengthening and dynamic gait. Cues for improved posture and weight shifting in to forefoot to prevent posterior LOB. Able to demonstrated reduced shuffling gait pattern without UE support following standing therex at rail He will  benefit from skilled PT to address balance, strength, gait speed and endurance for improved functional mobility and decreased fall risk.  OBJECTIVE IMPAIRMENTS: Abnormal gait, decreased balance, decreased mobility, difficulty walking, decreased strength, and postural dysfunction.   ACTIVITY LIMITATIONS: standing, squatting, transfers, and locomotion level  PARTICIPATION LIMITATIONS: community activity and church  PERSONAL FACTORS: 3+ comorbidities: see above PMH  are also affecting patient's functional outcome.   REHAB POTENTIAL: Good  CLINICAL DECISION MAKING: Evolving/moderate complexity  EVALUATION COMPLEXITY: Moderate  PLAN:  PT FREQUENCY: 1-2x/week  PT DURATION: 6 weeks plus eval visit  PLANNED INTERVENTIONS: 97110-Therapeutic exercises, 97530- Therapeutic activity, [id number]- Neuromuscular re-education, 97535- Self Care, [id number]- Manual therapy, [id number]- Gait training, Patient/Family education, Balance training, and DME instructions  PLAN FOR NEXT SESSION:   Dynamic balance and gait training. BLE strengthening.    Grier Rocher PT, DPT  Physical Therapist - [facility]  2:01 PM 12/05/23

## 2023-12-05 NOTE — Therapy (Signed)
OUTPATIENT SPEECH LANGUAGE PATHOLOGY  TREATMENT NOTE  Patient Name: Eugene Martinez. MRN: [id number] DOB:[birth year], 77 y.o., male Today's Date: 12/05/2023  PCP: Barbette Reichmann, MD REFERRING PROVIDER: Janice Coffin, PA-C  END OF SESSION:  End of Session - 12/05/23 1509     Visit Number 4    Number of Visits 13    Date for SLP Re-Evaluation 01/31/24    Authorization Type [employer]    Progress Note Due on Visit 10    SLP Start Time 1315    SLP Stop Time  1400    SLP Time Calculation (min) 45 min    Activity Tolerance Patient tolerated treatment well              Past Medical History:  Diagnosis Date   Allergic state    Amyloidosis (HCC)    Anemia    BPH (benign prostatic hyperplasia)    DDD (degenerative disc disease), lumbar    Dysplastic nevus 10/13/2013   Left posterior waistline paraspinal. Moderate to severe atypia, pattern borders on early evolving MIS, edge involved. Excised 03/31/2014, margins free.   Dysplastic nevus 12/19/2017   Right upper back medial. Mild atypia, lateral and deep margins involved.    Dysplastic nevus 11/28/2022   right prox lat thigh, Severe atypia, Excised 01/08/23   Dysrhythmia    Environmental and seasonal allergies    Gallop rhythm    GERD (gastroesophageal reflux disease)    Hypertension    Mild mitral regurgitation    a. by echo 10/2012   Pain    bil. 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 10/2012 Echo: EF 55-60%, severe LVH with speckling of myocardium -? amyloid. Mild MR, mod dil LA.   Past Surgical History:  Procedure Laterality Date   CIRCUMCISION  1984   COLONOSCOPY  03/2012   COLONOSCOPY WITH PROPOFOL N/A 10/29/2017   Procedure: COLONOSCOPY WITH PROPOFOL;  Surgeon: Christena Deem, MD;  Location: [facility] ENDOSCOPY;  Service: Endoscopy;  Laterality: N/A;   FOOT SURGERY     right foot   FOOT SURGERY Right 2017   LEG SURGERIES     HX.OF MULTIPLE SURGERIES FOR LEG LENGTH DUE TO  POLIO   LUMBAR LAMINECTOMY/DECOMPRESSION MICRODISCECTOMY N/A 01/26/2019   Procedure: LUMBAR LAMINECTOMY/DECOMPRESSION MICRODISCECTOMY 1 LEVEL L3-4;  Surgeon: Lucy Chris, MD;  Location: [facility] ORS;  Service: Neurosurgery;  Laterality: N/A;   NECK SURGERY     PILONIDAL CYST / SINUS EXCISION     Patient Active Problem List   Diagnosis Date Noted   Anemia 08/01/2020   Elevated hemoglobin A1c 08/01/2020   Loss of memory 08/01/2020   Sesamoiditis 06/25/2019   S/P lumbar laminectomy 01/26/2019   Weakness of both lower limbs 10/24/2018   LVH (left ventricular hypertrophy) 08/29/2016   Allergy 02/20/2016   Amyloidosis (HCC) 02/20/2016   Thrombocytopenia (HCC) 11/23/2015   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   Weight loss, unintentional 06/29/2013   Airway hyperreactivity 05/07/2013   Benign fibroma of prostate 05/07/2013   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 PMH of chemotherapy (2014), and polio (1954). Other PMH Primary hypertension  Elevated hemoglobin A1c  Thrombocytopenia (CMS-HCC)  Post-polio syndrome (CMS-HCC)  Other amyloidosis (CMS/HHS-HCC)  Mild intermittent asthma without complication (HHS-HCC)  Chronic diastolic heart failure (CMS/HHS-HCC  PAIN:  Are you having pain? No  FALLS: Has patient fallen in last 6 months?  See PT evaluation for details  LIVING ENVIRONMENT: Lives with: lives with their spouse Lives in:  House/apartment  PLOF:  Level of assistance: Comment: needs assistance with tasks that involve memory Employment: Retired  PATIENT GOALS: improve memory skills  SUBJECTIVE STATEMENT: Patient brought daughter today Pt accompanied by: daughter Beth  OBJECTIVE:  TODAY'S TREATMENT:                                                                                                                                         DATE:  Skilled treatment session focused on pt's cognitive communication goals: SLP facilitated session by providing the following interventions:  Facilitated orientation and use of memory strategies using calendar; patient required min-mod verbal and visual cues for locating correct date, as he had not checked off days on his calendar since previous visit. Patient also did not make daily to do list, reports has been off routine with the holidays. Educated daughter on purpose of memory book and writing daily list. Client required min cues for recall of today's earlier activities, but max cues for events of prior 2 days. Added these to his memory book and encouraged daughter/family to assist patient with establishing routine of writing his planned activities each morning, checking off when completed.     PATIENT EDUCATION: Education details: see "today's treatment" above Person educated: Patient and Spouse Education method: Explanation, Demonstration, and Handouts Education comprehension: verbalized understanding and needs further education   GOALS: Goals reviewed with patient? Yes  SHORT TERM GOALS: Target date: 01/03/24  Pt will demo knowledge that he needs to use practical compensation in a situation which has been difficult in the past, x2 sessions Baseline: Goal status: INITIAL  2.  Pt and/or wife will develop a medication administration system  Baseline:  Goal status: INITIAL  3.  Pt will make it on time to 100% of appointments in a 2-week period prior to 01/03/24  using compensations Baseline:  Goal status: INITIAL  4.  Pt will complete a financial task using a memory compensation system prior to 01/03/24 Baseline:  Goal status: INITIAL   LONG TERM GOALS: Target date: 01/31/24  Pt will improve PROM compared to initial administration Baseline:  Goal status: INITIAL  2.  Pt will successfully use a medication administration system for two weeks prior to 01/31/24 Baseline:  Goal status: INITIAL  3.  Pt will make it on time to 100% of appointments in a 2-week period  after 01/03/24 using compensations Baseline:  Goal status: INITIAL  4.  Pt will complete a financial responsibility using a memory compensation system, between two sessions Baseline:  Goal status: INITIAL   ASSESSMENT:  CLINICAL IMPRESSION: Patient is a 77 y.o. M  who was seen today for cognitive communication deficit. Pt presents with a moderate primarily amnestic cognitive impairment. He was able to offer several examples of how his deficits are interfering with day to day activities and was receptive to discuss of using strategies to improve his functioning.   OBJECTIVE IMPAIRMENTS: include memory. These impairments are limiting patient from managing medications, managing appointments, managing finances, household responsibilities, ADLs/IADLs, and effectively communicating at home and in community. Factors affecting potential to achieve goals and functional outcome are ability to learn/carryover information.. Patient will benefit from skilled SLP services to address above impairments and improve overall function.  REHAB POTENTIAL: Good  PLAN:  SLP FREQUENCY: 1-2x/week  SLP DURATION: 8 weeks  PLANNED INTERVENTIONS: 92507 Treatment of speech (30 or 45 min) , Environmental controls, Cognitive reorganization, Internal/external aids, Functional tasks, Multimodal communication approach, SLP instruction and feedback, Compensatory strategies, and Patient/family education   Rondel Baton, MS, CCC-SLP Speech-Language Pathologist    Arlana Lindau, CCC-SLP 12/05/2023, 3:10 PM

## 2023-12-09 ENCOUNTER — Ambulatory Visit: Payer: PPO | Admitting: Speech Pathology

## 2023-12-09 ENCOUNTER — Encounter: Payer: Self-pay | Admitting: Dermatology

## 2023-12-09 DIAGNOSIS — R41841 Cognitive communication deficit: Secondary | ICD-10-CM | POA: Diagnosis not present

## 2023-12-09 NOTE — Therapy (Signed)
OUTPATIENT SPEECH LANGUAGE PATHOLOGY  TREATMENT NOTE  Patient Name: Eugene Martinez. MRN: [id number] DOB:03-Aug-1946, 77 y.o., male Today's Date: 12/09/2023  PCP: Barbette Reichmann, MD REFERRING PROVIDER: Janice Coffin, PA-C  END OF SESSION:  End of Session - 12/09/23 1317     Visit Number 5    Number of Visits 13    Date for SLP Re-Evaluation 01/31/24    Authorization Type [employer]    Progress Note Due on Visit 10    SLP Start Time 1315    SLP Stop Time  1400    SLP Time Calculation (min) 45 min    Activity Tolerance Patient tolerated treatment well              Past Medical History:  Diagnosis Date   Allergic state    Amyloidosis (HCC)    Anemia    BPH (benign prostatic hyperplasia)    DDD (degenerative disc disease), lumbar    Dysplastic nevus 10/13/2013   Left posterior waistline paraspinal. Moderate to severe atypia, pattern borders on early evolving MIS, edge involved. Excised 03/31/2014, margins free.   Dysplastic nevus 12/19/2017   Right upper back medial. Mild atypia, lateral and deep margins involved.    Dysplastic nevus 11/28/2022   right prox lat thigh, Severe atypia, Excised 01/08/23   Dysrhythmia    Environmental and seasonal allergies    Gallop rhythm    GERD (gastroesophageal reflux disease)    Hypertension    Mild mitral regurgitation    a. by echo 10/2012   Pain    bil. 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 10/2012 Echo: EF 55-60%, severe LVH with speckling of myocardium -? amyloid. Mild MR, mod dil LA.   Past Surgical History:  Procedure Laterality Date   CIRCUMCISION  1984   COLONOSCOPY  03/2012   COLONOSCOPY WITH PROPOFOL N/A 10/29/2017   Procedure: COLONOSCOPY WITH PROPOFOL;  Surgeon: Christena Deem, MD;  Location: [facility] ENDOSCOPY;  Service: Endoscopy;  Laterality: N/A;   FOOT SURGERY     right foot   FOOT SURGERY Right 2017   LEG SURGERIES     HX.OF MULTIPLE SURGERIES FOR LEG LENGTH DUE TO  POLIO   LUMBAR LAMINECTOMY/DECOMPRESSION MICRODISCECTOMY N/A 01/26/2019   Procedure: LUMBAR LAMINECTOMY/DECOMPRESSION MICRODISCECTOMY 1 LEVEL L3-4;  Surgeon: Lucy Chris, MD;  Location: [facility] ORS;  Service: Neurosurgery;  Laterality: N/A;   NECK SURGERY     PILONIDAL CYST / SINUS EXCISION     Patient Active Problem List   Diagnosis Date Noted   Anemia 08/01/2020   Elevated hemoglobin A1c 08/01/2020   Loss of memory 08/01/2020   Sesamoiditis 06/25/2019   S/P lumbar laminectomy 01/26/2019   Weakness of both lower limbs 10/24/2018   LVH (left ventricular hypertrophy) 08/29/2016   Allergy 02/20/2016   Amyloidosis (HCC) 02/20/2016   Thrombocytopenia (HCC) 11/23/2015   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   Weight loss, unintentional 06/29/2013   Airway hyperreactivity 05/07/2013   Benign fibroma of prostate 05/07/2013   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 PMH of chemotherapy (2014), and polio (1954). Other PMH Primary hypertension  Elevated hemoglobin A1c  Thrombocytopenia (CMS-HCC)  Post-polio syndrome (CMS-HCC)  Other amyloidosis (CMS/HHS-HCC)  Mild intermittent asthma without complication (HHS-HCC)  Chronic diastolic heart failure (CMS/HHS-HCC  PAIN:  Are you having pain? No  FALLS: Has patient fallen in last 6 months?  See PT evaluation for details  LIVING ENVIRONMENT: Lives with: lives with their spouse Lives in:  House/apartment  PLOF:  Level of assistance: Comment: needs assistance with tasks that involve memory Employment: Retired  PATIENT GOALS: improve memory skills  SUBJECTIVE STATEMENT: Pt brought in his new memory book Pt accompanied by: his wife  OBJECTIVE:  TODAY'S TREATMENT:                                                                                                                                         DATE:  Skilled treatment session focused on pt's cognitive communication goals: SLP facilitated session by providing the following interventions:   Memory book was completed by pt and his wife with minimal details - suspect pt is largely unaware of need for notebook  Pt continues to drive with his wife present but is not able to recall where to turn resulting in his wife "exploding in frustration" This Clinical research associate reviewed concerns for safety with pt behind the wheel and his wife having negative emotional responses when he requires directional help.   His wife continues to report that she is responding negatively to pt's lack of ability to recall directions to places that he had been a million times or when he is unable to completely fill out checks, recall where envelopes and bill statements are because "he was an account for 40 years."   In an effort to help his wife better understand the above mentioned deficits, SLP provided skilled written and verbal information on dementia, definition, cognitive deficits specific to pt found within his dementia dx, disease process of dementia, brain cell death. While she was receptive, she will likely continue to benefit from further education to help manage her expectations as well as create activities that pt can complete with success.   Pt and his wife instructed on having wife complete all bill paying, medication administration and all driving. SLP also     PATIENT EDUCATION: Education details: see "today's treatment" above Person educated:  Patient and Spouse Education method: Explanation, Demonstration, and Handouts Education comprehension: verbalized understanding and needs further education   GOALS: Goals reviewed with patient? Yes  SHORT TERM GOALS: Target date: 01/03/24  Pt will demo knowledge that he needs to use practical compensation in a situation which has been difficult in the past, x2 sessions Baseline: Goal status: INITIAL  2.  Pt and/or wife will develop a medication administration system  Baseline:  Goal status: INITIAL  3.  Pt will make it on time to 100% of appointments in a 2-week period prior to 01/03/24 using compensations Baseline:  Goal status: INITIAL  4.  Pt will complete a financial task using a memory compensation system prior to 01/03/24 Baseline:  Goal status: INITIAL   LONG TERM GOALS: Target date: 01/31/24  Pt will improve PROM compared to initial administration Baseline:  Goal status: INITIAL  2.  Pt will successfully use a medication administration system for two weeks prior to 01/31/24 Baseline:  Goal status: INITIAL  3.  Pt will make it on time to 100% of appointments in a 2-week period  after 01/03/24 using compensations Baseline:  Goal status: INITIAL  4.  Pt will complete a financial responsibility using a memory compensation system, between two sessions Baseline:  Goal status: INITIAL   ASSESSMENT:  CLINICAL IMPRESSION: Patient is a 77 y.o. M  who was seen today for cognitive communication deficit. Pt presents with a moderate primarily amnestic cognitive impairment. He was able to offer several examples of how his deficits are interfering with day to day activities and was receptive to discuss of using strategies to improve his functioning.   OBJECTIVE IMPAIRMENTS: include memory. These impairments are limiting patient from managing medications, managing appointments, managing finances, household responsibilities, ADLs/IADLs, and effectively communicating at home and in  community. Factors affecting potential to achieve goals and functional outcome are ability to learn/carryover information.. Patient will benefit from skilled SLP services to address above impairments and improve overall function.  REHAB POTENTIAL: Good  PLAN:  SLP FREQUENCY: 1-2x/week  SLP DURATION: 8 weeks  PLANNED INTERVENTIONS: 92507 Treatment of speech (30 or 45 min) , Environmental controls, Cognitive reorganization, Internal/external aids, Functional tasks, Multimodal communication approach, SLP instruction and feedback, Compensatory strategies, and Patient/family education   Austine Wiedeman B. Dreama Saa, M.S., CCC-SLP, Tree surgeon Certified Brain Injury Specialist [facility]  [facility] Rehabilitation Services Office [phone] Ascom [phone] Fax [phone]

## 2023-12-12 ENCOUNTER — Ambulatory Visit: Payer: PPO

## 2023-12-12 ENCOUNTER — Ambulatory Visit: Payer: PPO | Attending: Student | Admitting: Speech Pathology

## 2023-12-12 DIAGNOSIS — R41841 Cognitive communication deficit: Secondary | ICD-10-CM | POA: Diagnosis not present

## 2023-12-12 DIAGNOSIS — R2681 Unsteadiness on feet: Secondary | ICD-10-CM | POA: Diagnosis not present

## 2023-12-12 DIAGNOSIS — R2689 Other abnormalities of gait and mobility: Secondary | ICD-10-CM | POA: Diagnosis not present

## 2023-12-12 DIAGNOSIS — M6281 Muscle weakness (generalized): Secondary | ICD-10-CM | POA: Diagnosis not present

## 2023-12-12 DIAGNOSIS — R262 Difficulty in walking, not elsewhere classified: Secondary | ICD-10-CM | POA: Insufficient documentation

## 2023-12-12 DIAGNOSIS — R278 Other lack of coordination: Secondary | ICD-10-CM | POA: Insufficient documentation

## 2023-12-12 NOTE — Therapy (Signed)
 OUTPATIENT SPEECH LANGUAGE PATHOLOGY  TREATMENT NOTE  Patient Name: Eugene Martinez. MRN: [id number] DOB:09-Mar-1946, 78 y.o., male Today's Date: 12/12/2023  PCP: Sadie Manna, MD REFERRING PROVIDER: Paich, Kaitlin, PA-C  END OF SESSION:  End of Session - 12/12/23 1313     Visit Number 6    Number of Visits 13    Date for SLP Re-Evaluation 01/31/24    Authorization Type [employer]    Progress Note Due on Visit 10    SLP Start Time 1315    SLP Stop Time  1400    SLP Time Calculation (min) 45 min    Activity Tolerance Patient tolerated treatment well              Past Medical History:  Diagnosis Date   Allergic state    Amyloidosis (HCC)    Anemia    BPH (benign prostatic hyperplasia)    DDD (degenerative disc disease), lumbar    Dysplastic nevus 10/13/2013   Left posterior waistline paraspinal. Moderate to severe atypia, pattern borders on early evolving MIS, edge involved. Excised 03/31/2014, margins free.   Dysplastic nevus 12/19/2017   Right upper back medial. Mild atypia, lateral and deep margins involved.    Dysplastic nevus 11/28/2022   right prox lat thigh, Severe atypia, Excised 01/08/23   Dysrhythmia    Environmental and seasonal allergies    Gallop rhythm    GERD (gastroesophageal reflux disease)    Hypertension    Mild mitral regurgitation    a. by echo 10/2012   Pain    bil. 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 10/2012 Echo: EF 55-60%, severe LVH with speckling of myocardium -? amyloid. Mild MR, mod dil LA.   Past Surgical History:  Procedure Laterality Date   CIRCUMCISION  1984   COLONOSCOPY  03/2012   COLONOSCOPY WITH PROPOFOL  N/A 10/29/2017   Procedure: COLONOSCOPY WITH PROPOFOL ;  Surgeon: Gaylyn Gladis PENNER, MD;  Location: [facility] ENDOSCOPY;  Service: Endoscopy;  Laterality: N/A;   FOOT SURGERY     right foot   FOOT SURGERY Right 2017   LEG SURGERIES     HX.OF MULTIPLE SURGERIES FOR LEG LENGTH DUE TO  POLIO   LUMBAR LAMINECTOMY/DECOMPRESSION MICRODISCECTOMY N/A 01/26/2019   Procedure: LUMBAR LAMINECTOMY/DECOMPRESSION MICRODISCECTOMY 1 LEVEL L3-4;  Surgeon: Bluford Standing, MD;  Location: [facility] ORS;  Service: Neurosurgery;  Laterality: N/A;   NECK SURGERY     PILONIDAL CYST / SINUS EXCISION     Patient Active Problem List   Diagnosis Date Noted   Anemia 08/01/2020   Elevated hemoglobin A1c 08/01/2020   Loss of memory 08/01/2020   Sesamoiditis 06/25/2019   S/P lumbar laminectomy 01/26/2019   Weakness of both lower limbs 10/24/2018   LVH (left ventricular hypertrophy) 08/29/2016   Allergy 02/20/2016   Amyloidosis (HCC) 02/20/2016   Thrombocytopenia (HCC) 11/23/2015   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   Weight loss, unintentional 06/29/2013   Airway hyperreactivity 05/07/2013   Benign fibroma of prostate 05/07/2013   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 PMH of chemotherapy (2014), and polio (1954). Other PMH Primary hypertension  Elevated hemoglobin A1c  Thrombocytopenia (CMS-HCC)  Post-polio syndrome (CMS-HCC)  Other amyloidosis (CMS/HHS-HCC)  Mild intermittent asthma without complication (HHS-HCC)  Chronic diastolic heart failure (CMS/HHS-HCC  PAIN:  Are you having pain? No  FALLS: Has patient fallen in last 6 months?  See PT evaluation for details  LIVING ENVIRONMENT: Lives with: lives with their spouse Lives in:  House/apartment  PLOF:  Level of assistance: Comment: needs assistance with tasks that involve memory Employment: Retired  PATIENT GOALS: improve memory skills  SUBJECTIVE STATEMENT: Pt and his wife report reading information from last session, pt states I am letting her drive everywhere now Pt accompanied by: his wife  OBJECTIVE:  TODAY'S TREATMENT:                                                                                                                                         DATE:  Skilled treatment session focused on pt's cognitive communication goals: SLP facilitated session by providing the following interventions:   Memory book was completed and contained greater detail; additional recommendations were written in notebook by this clinical research associate as discussed in session- recommend that pt's wife organize bills and jointly go over bills with is wife carrying primary responsibility for ensuring they are pain  Also recommend that pt place his pill organizer on the kitchen counter so that it is a visible reminder to take his medicine vs in the bathroom drawer  SLP also proposed additional activities to promote cognitive function such as reading (magazines, his daily devotional), complete word searches, play games, send cards to the sick and shut ins within his Sunday [employer] Class as well as participate in safe activiites with his wife such as taking ornaments off their tree etc    PATIENT EDUCATION: Education details: see today's treatment above Person educated: Patient and Spouse Education method: Explanation, Demonstration, and Handouts Education comprehension: verbalized understanding and needs further education   GOALS: Goals reviewed with patient? Yes  SHORT TERM GOALS: Target date: 01/03/24  Pt will demo knowledge that he needs to use practical compensation in a situation which has been difficult in the past, x2 sessions Baseline: Goal status: INITIAL  2.  Pt and/or  wife will develop a medication administration system  Baseline:  Goal status: INITIAL  3.  Pt will make it on time to 100% of appointments in a 2-week period prior to 01/03/24 using compensations Baseline:  Goal status: INITIAL  4.  Pt will complete a financial task using a memory compensation system prior to 01/03/24 Baseline:  Goal status: INITIAL   LONG TERM GOALS: Target date: 01/31/24  Pt will improve PROM compared to initial administration Baseline:  Goal status: INITIAL  2.  Pt will  successfully use a medication administration system for two weeks prior to 01/31/24 Baseline:  Goal status: INITIAL  3.  Pt will make it on time to 100% of appointments in a 2-week period  after 01/03/24 using compensations Baseline:  Goal status: INITIAL  4.  Pt will complete a financial responsibility using a memory compensation system, between two sessions Baseline:  Goal status: INITIAL   ASSESSMENT:  CLINICAL IMPRESSION: Patient is a 78 y.o. M  who was seen today for cognitive communication deficit. Pt presents with a moderate primarily amnestic cognitive impairment. Pt and his wife report improved understanding of dementia and his wife reports decreasing moments of frustration.   Se the above treatment note for more details.   OBJECTIVE IMPAIRMENTS: include memory. These impairments are limiting patient from managing medications, managing appointments, managing finances, household responsibilities, ADLs/IADLs, and effectively communicating at home and in community. Factors affecting potential to achieve goals and functional outcome are ability to learn/carryover information.. Patient will benefit from skilled SLP services to address above impairments and improve overall function.  REHAB POTENTIAL: Good  PLAN:  SLP FREQUENCY: 1-2x/week  SLP DURATION: 8 weeks  PLANNED INTERVENTIONS: 92507 Treatment of speech (30 or 45 min) , Environmental controls, Cognitive reorganization,  Internal/external aids, Functional tasks, Multimodal communication approach, SLP instruction and feedback, Compensatory strategies, and Patient/family education   Rakeisha Nyce B. Rubbie, M.S., CCC-SLP, Tree Surgeon Certified Brain Injury Specialist [facility]  [facility] Rehabilitation Services Office [phone] Ascom [phone] Fax [phone]

## 2023-12-12 NOTE — Therapy (Signed)
 OUTPATIENT PHYSICAL THERAPY TREATMENT   Patient Name: Eugene Martinez. MRN: [id number] DOB:July 04, 1946, 78 y.o., male Today's Date: 12/12/2023   PCP: Sadie Manna, MD REFERRING PROVIDER: Paich, Kaitlin, PA-C   END OF SESSION:  PT End of Session - 12/12/23 1418     Visit Number 5    Number of Visits 13    Date for PT Re-Evaluation 01/10/24    Authorization Type HTA    Progress Note Due on Visit 10    PT Start Time 1400    PT Stop Time 1440    PT Time Calculation (min) 40 min    Equipment Utilized During Treatment Gait belt    Activity Tolerance Patient tolerated treatment well;No increased pain    Behavior During Therapy WFL for tasks assessed/performed             Past Medical History:  Diagnosis Date   Allergic state    Amyloidosis (HCC)    Anemia    BPH (benign prostatic hyperplasia)    DDD (degenerative disc disease), lumbar    Dysplastic nevus 10/13/2013   Left posterior waistline paraspinal. Moderate to severe atypia, pattern borders on early evolving MIS, edge involved. Excised 03/31/2014, margins free.   Dysplastic nevus 12/19/2017   Right upper back medial. Mild atypia, lateral and deep margins involved.    Dysplastic nevus 11/28/2022   right prox lat thigh, Severe atypia, Excised 01/08/23   Dysrhythmia    Environmental and seasonal allergies    Gallop rhythm    GERD (gastroesophageal reflux disease)    Hypertension    Mild mitral regurgitation    a. by echo 10/2012   Pain    bil. 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 10/2012 Echo: EF 55-60%, severe LVH with speckling of myocardium -? amyloid. Mild MR, mod dil LA.   Past Surgical History:  Procedure Laterality Date   CIRCUMCISION  1984   COLONOSCOPY  03/2012   COLONOSCOPY WITH PROPOFOL  N/A 10/29/2017   Procedure: COLONOSCOPY WITH PROPOFOL ;  Surgeon: Gaylyn Gladis PENNER, MD;  Location: [facility] ENDOSCOPY;  Service: Endoscopy;  Laterality: N/A;   FOOT SURGERY     right foot    FOOT SURGERY Right 2017   LEG SURGERIES     HX.OF MULTIPLE SURGERIES FOR LEG LENGTH DUE TO POLIO   LUMBAR LAMINECTOMY/DECOMPRESSION MICRODISCECTOMY N/A 01/26/2019   Procedure: LUMBAR LAMINECTOMY/DECOMPRESSION MICRODISCECTOMY 1 LEVEL L3-4;  Surgeon: Bluford Standing, MD;  Location: [facility] ORS;  Service: Neurosurgery;  Laterality: N/A;   NECK SURGERY     PILONIDAL CYST / SINUS EXCISION     Patient Active Problem List   Diagnosis Date Noted   Anemia 08/01/2020   Elevated hemoglobin A1c 08/01/2020   Loss of memory 08/01/2020   Sesamoiditis 06/25/2019   S/P lumbar laminectomy 01/26/2019   Weakness of both lower limbs 10/24/2018   LVH (left ventricular hypertrophy) 08/29/2016   Allergy 02/20/2016   Amyloidosis (HCC) 02/20/2016   Thrombocytopenia (HCC) 11/23/2015   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   Weight loss, unintentional 06/29/2013   Airway hyperreactivity 05/07/2013   Benign fibroma of prostate 05/07/2013   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   Chronic diastolic heart failure (HCC) 11/18/2012   Proteinuria, Bence Jones 11/18/2012   Multiple allergies 10/29/2012   Hypertension 10/29/2012   RBBB (right bundle branch block with left anterior fascicular block) 10/29/2012   Proteinuria 10/29/2012    ONSET DATE: 11/06/2023 (MD referral)  REFERRING DIAG: G31.84 (  ICD-10-CM) - Mild cognitive impairment of uncertain or unknown etiology   THERAPY DIAG:  Unsteadiness on feet  Other abnormalities of gait and mobility  Rationale for Evaluation and Treatment: Rehabilitation  SUBJECTIVE:                                                                                                                                                                                             SUBJECTIVE STATEMENT: Pt doing well today overall, no recent updates. Pt reports HEP compliance, but wife says not as good as he says.   Pt accompanied by: significant other, [name]  PERTINENT HISTORY: Has been dx  with MCI, fairly stable for the last 1-2 years.  PA suggested physical therapy.  Intermittent use of quad cane in the house. Pt has a new [employer] he purchased at end of December, has not used it much yet. Wife notices that R side gets weaker, this is the side that was affected by polio. 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 No  PRECAUTIONS: Fall; wears R AFO  RED FLAGS: None   WEIGHT BEARING RESTRICTIONS: No  FALLS: Has patient fallen in last 6 months? No *Wife reports fall 8 months ago and wife couldn't get him up*  LIVING ENVIRONMENT: Lives with: lives with their spouse Lives in: House/apartment Stairs:  1 step into home Has following equipment at home: Counselling psychologist and Environmental Consultant - 2 wheeled *On arts development officer for [employer] PLOF: Independent with household mobility with device and Independent with community mobility with device Enjoys yardwork, no longer does this  PATIENT GOALS: Pt's goals for therapy are to walk without falling.   OBJECTIVE:   TODAY'S TREATMENT:                                                                                                                              DATE: 12/12/23   -445ft LL, CCW RUE walking stick  -425ft  LL CW RUE walking stick -15lb med ball carry 2x83ft -Marching in place x40 -180 degree turns x10  -15lb med ball carry 2x25ft -129ft AMB c 3lb FW in each hand with cues for weaving between equipment in gym   -Balance stability course to practice heavy RUE usage with [employer]: 2x4 balance beam, red mat, 4 step up/down, 6 canes thrown onto the floor.  *Pt is meticulous with performance on this course, achieves a high level of performance and appropriate use of device to achieve a higher level of stability including 1 instance of cane step righting to LOB  recovery. MinGuard Assist provided throughout with 2 instances of ModA righting. Pt struggles most with forward step up and forward step down.    PATIENT EDUCATION: Education details: Butch, canes take practice to best utility in falls prevention, so practice using these devices in a variety of settings and terrains to maximize your use in righting, positional efficacy.   Person educated: Patient and Spouse Education method: Explanation, Demonstration, and Handouts Education comprehension: verbalized understanding and returned demonstration  HOME EXERCISE PROGRAM: Access Code: [email or web address] URL: [email or web address] Date: 11/25/2023 Prepared by: [facility] - Outpatient  Rehab - [facility]  Exercises - Single Leg Stance with Support  - 1 x daily - 5 x weekly - 1 sets - 3 reps - 10 sec hold - Standing Romberg to 1/2 Tandem Stance  - 1 x daily - 5 x weekly - 1 sets - 3 reps - 15 sec hold  GOALS: Goals reviewed with patient? Yes  SHORT TERM GOALS: Target date: 12/27/2023  Pt will be supervision with HEP for improved balance, gait. Baseline: given on 12/16 Goal status: INITIAL  2.  Pt will improve TUG score to less than or equal to 15 sec for decreased fall risk. Baseline: 20.79 sec Goal status: INITIAL  3.  Pt will improve Berg score to at least 40/56 to decrease fall risk. Baseline: 35/56 Goal status: INITIAL  4.  Pt/wife will verbalize understanding of fall prevention in home environment.  Baseline:  Goal status: INITIAL   LONG TERM GOALS: Target date: 01/10/2024  Pt will be supervision with progression of HEP for improved balance, gait. Baseline:  Goal status: INITIAL  2.  Pt will improve gait velocity to at least 1.8 ft/sec for improved gait efficiency and safety. Baseline: 1.56 ft/sec Goal status: INITIAL  3.  Pt will improve Berg score to at least 45/56 to decrease fall risk. Baseline: 35/56 Goal status: INITIAL  4.  Pt will perform floor>stand  transfer with min assist for safe fall recovery  Baseline:  Wife reports she was unable to get him up on his own with fall 8 months ago  Goal status:  INITIAL   ASSESSMENT:  CLINICAL IMPRESSION:  Pt arrives direct from speech therapy session, appears alert, well nourished. Pt is agreeable to session, motivated to participate and further his progress. Advanced degree of AMB in session today, now >1076ft, not particularly speed appropriate to distance, however pt does not report nor demonstrate any fatigue related decline in gait parameters. Much of session performed without device, however we reserve Cane training for most challenging gait scenarios, making ample opportunity fo rpt to learn organically the best problem solving and SPC use for a variety of situations. Pt will benefit from skilled PT to address balance, strength, gait speed and endurance for improved functional mobility and decreased fall risk.  OBJECTIVE IMPAIRMENTS: Abnormal gait, decreased balance, decreased mobility, difficulty walking, decreased strength,  and postural dysfunction.   ACTIVITY LIMITATIONS: standing, squatting, transfers, and locomotion level  PARTICIPATION LIMITATIONS: community activity and church  PERSONAL FACTORS: 3+ comorbidities: see above PMH  are also affecting patient's functional outcome.   REHAB POTENTIAL: Good  CLINICAL DECISION MAKING: Evolving/moderate complexity  EVALUATION COMPLEXITY: Moderate  PLAN:  PT FREQUENCY: 1-2x/week  PT DURATION: 6 weeks plus eval visit  PLANNED INTERVENTIONS: 97110-Therapeutic exercises, 97530- Therapeutic activity, [id number]- Neuromuscular re-education, 97535- Self Care, 02859- Manual therapy, [id number]- Gait training, Patient/Family education, Balance training, and DME instructions  PLAN FOR NEXT SESSION:   Dynamic balance and gait training. BLE strengthening.    2:30 PM, 12/12/23 Peggye JAYSON Linear, PT, DPT Physical Therapist - [facility]  Outpatient Physical Therapy- Main Campus [phone]

## 2023-12-16 ENCOUNTER — Ambulatory Visit: Payer: PPO | Admitting: Speech Pathology

## 2023-12-16 ENCOUNTER — Ambulatory Visit: Payer: PPO

## 2023-12-16 DIAGNOSIS — R41841 Cognitive communication deficit: Secondary | ICD-10-CM | POA: Diagnosis not present

## 2023-12-16 DIAGNOSIS — R2681 Unsteadiness on feet: Secondary | ICD-10-CM

## 2023-12-16 DIAGNOSIS — M6281 Muscle weakness (generalized): Secondary | ICD-10-CM

## 2023-12-16 DIAGNOSIS — R262 Difficulty in walking, not elsewhere classified: Secondary | ICD-10-CM

## 2023-12-16 NOTE — Therapy (Signed)
 OUTPATIENT PHYSICAL THERAPY TREATMENT   Patient Name: Eugene Martinez. MRN: [id number] DOB:November 11, 1946, 78 y.o., male Today's Date: 12/16/2023   PCP: Sadie Manna, MD REFERRING PROVIDER: Paich, Kaitlin, PA-C   END OF SESSION:  PT End of Session - 12/16/23 0843     Visit Number 6    Number of Visits 13    Date for PT Re-Evaluation 01/10/24    Authorization Type HTA    Progress Note Due on Visit 10    PT Start Time 0849    PT Stop Time 0929    PT Time Calculation (min) 40 min    Equipment Utilized During Treatment Gait belt    Activity Tolerance Patient tolerated treatment well;No increased pain    Behavior During Therapy WFL for tasks assessed/performed             Past Medical History:  Diagnosis Date   Allergic state    Amyloidosis (HCC)    Anemia    BPH (benign prostatic hyperplasia)    DDD (degenerative disc disease), lumbar    Dysplastic nevus 10/13/2013   Left posterior waistline paraspinal. Moderate to severe atypia, pattern borders on early evolving MIS, edge involved. Excised 03/31/2014, margins free.   Dysplastic nevus 12/19/2017   Right upper back medial. Mild atypia, lateral and deep margins involved.    Dysplastic nevus 11/28/2022   right prox lat thigh, Severe atypia, Excised 01/08/23   Dysrhythmia    Environmental and seasonal allergies    Gallop rhythm    GERD (gastroesophageal reflux disease)    Hypertension    Mild mitral regurgitation    a. by echo 10/2012   Pain    bil. 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 10/2012 Echo: EF 55-60%, severe LVH with speckling of myocardium -? amyloid. Mild MR, mod dil LA.   Past Surgical History:  Procedure Laterality Date   CIRCUMCISION  1984   COLONOSCOPY  03/2012   COLONOSCOPY WITH PROPOFOL  N/A 10/29/2017   Procedure: COLONOSCOPY WITH PROPOFOL ;  Surgeon: Gaylyn Gladis PENNER, MD;  Location: [facility] ENDOSCOPY;  Service: Endoscopy;  Laterality: N/A;   FOOT SURGERY     right foot    FOOT SURGERY Right 2017   LEG SURGERIES     HX.OF MULTIPLE SURGERIES FOR LEG LENGTH DUE TO POLIO   LUMBAR LAMINECTOMY/DECOMPRESSION MICRODISCECTOMY N/A 01/26/2019   Procedure: LUMBAR LAMINECTOMY/DECOMPRESSION MICRODISCECTOMY 1 LEVEL L3-4;  Surgeon: Bluford Standing, MD;  Location: [facility] ORS;  Service: Neurosurgery;  Laterality: N/A;   NECK SURGERY     PILONIDAL CYST / SINUS EXCISION     Patient Active Problem List   Diagnosis Date Noted   Anemia 08/01/2020   Elevated hemoglobin A1c 08/01/2020   Loss of memory 08/01/2020   Sesamoiditis 06/25/2019   S/P lumbar laminectomy 01/26/2019   Weakness of both lower limbs 10/24/2018   LVH (left ventricular hypertrophy) 08/29/2016   Allergy 02/20/2016   Amyloidosis (HCC) 02/20/2016   Thrombocytopenia (HCC) 11/23/2015   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   Weight loss, unintentional 06/29/2013   Airway hyperreactivity 05/07/2013   Benign fibroma of prostate 05/07/2013   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   Chronic diastolic heart failure (HCC) 11/18/2012   Proteinuria, Bence Jones 11/18/2012   Multiple allergies 10/29/2012   Hypertension 10/29/2012   RBBB (right bundle branch block with left anterior fascicular block) 10/29/2012   Proteinuria 10/29/2012    ONSET DATE: 11/06/2023 (MD referral)  REFERRING DIAG: G31.84 (  ICD-10-CM) - Mild cognitive impairment of uncertain or unknown etiology   THERAPY DIAG:  Unsteadiness on feet  Difficulty in walking, not elsewhere classified  Muscle weakness (generalized)  Rationale for Evaluation and Treatment: Rehabilitation  SUBJECTIVE:                                                                                                                                                                                             SUBJECTIVE STATEMENT: Pt reports no pain, he reports no falls, but spouse reports pt has stumbled some on steps. No medication changes. He has been performing HEP. Pt and spouse  reports no new concerns.    Pt accompanied by: significant other, [name]  PERTINENT HISTORY: Has been dx with MCI, fairly stable for the last 1-2 years.  PA suggested physical therapy.  Intermittent use of quad cane in the house. Pt has a new [employer] he purchased at end of December, has not used it much yet. Wife notices that R side gets weaker, this is the side that was affected by polio. 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 No  PRECAUTIONS: Fall; wears R AFO  RED FLAGS: None   WEIGHT BEARING RESTRICTIONS: No  FALLS: Has patient fallen in last 6 months? No *Wife reports fall 8 months ago and wife couldn't get him up*  LIVING ENVIRONMENT: Lives with: lives with their spouse Lives in: House/apartment Stairs:  1 step into home Has following equipment at home: Counselling psychologist and Environmental Consultant - 2 wheeled *On arts development officer for [employer] PLOF: Independent with household mobility with device and Independent with community mobility with device Enjoys yardwork, no longer does this  PATIENT GOALS: Pt's goals for therapy are to walk without falling.   OBJECTIVE:   TODAY'S TREATMENT:  DATE: 12/16/23     TE: Nustep strength/endurance/interval training Level 1 x 1.5 min  Level 3 x 1  min Level 4 x 2 min  Level 5 x 2 min  Level 1 x 1 min Cues from PT for consistent steps per min through varied resistance/with PT adjusting resistance level. Pt maintains SPM in 50s-60s.  STS 1x10 hands-free, 1x15 hands-free  Standing therex: at rail x 2 rounds of each  Hip abduction x 10 bil  Partial squat x 10  HS curl x 15 Standing hip ext 15x    NMR: gait belt donned and close CGA throughout  In // bars:  several minutes of the following with intermittent UE support  Gait FWD/BCKWD   Gait FWD/BCKWD with vertical head turns  Gait FWD/BCKWD with horizontal head turns  LTL gait/stepping each way  WBOS EC 2x30 sec  NBOS EC 4x30 sec    PATIENT EDUCATION: Education details: Pt educated throughout session about proper posture and technique with exercises. Improved exercise technique, movement at target joints, use of target muscles after min to mod verbal, visual, tactile cues.   Person educated: Patient and Spouse Education method: Explanation and Demonstration Education comprehension: verbalized understanding and returned demonstration  HOME EXERCISE PROGRAM: Access Code: [email or web address] URL: [email or web address] Date: 11/25/2023 Prepared by: [facility] - Outpatient  Rehab - [facility]  Exercises - Single Leg Stance with Support  - 1 x daily - 5 x weekly - 1 sets - 3 reps - 10 sec hold - Standing Romberg to 1/2 Tandem Stance  - 1 x daily - 5 x weekly - 1 sets - 3 reps - 15 sec hold  GOALS: Goals reviewed with patient? Yes  SHORT TERM GOALS: Target date: 12/27/2023  Pt will be supervision with HEP for improved balance, gait. Baseline: given on 12/16 Goal status: INITIAL  2.  Pt will improve TUG score to less than or equal to 15 sec for decreased fall risk. Baseline: 20.79 sec Goal status: INITIAL  3.  Pt will improve Berg score to at least 40/56 to decrease fall risk. Baseline: 35/56 Goal status: INITIAL  4.  Pt/wife will verbalize understanding of fall prevention in home environment.  Baseline:  Goal status: INITIAL   LONG TERM GOALS: Target date: 01/10/2024  Pt will be supervision with progression of HEP for improved balance, gait. Baseline:  Goal status: INITIAL  2.  Pt will improve gait velocity to at least 1.8 ft/sec for improved gait efficiency and safety. Baseline: 1.56 ft/sec Goal status: INITIAL  3.  Pt will improve Berg score to at least 45/56  to decrease fall risk. Baseline: 35/56 Goal status: INITIAL  4.  Pt will perform floor>stand transfer with min assist for safe fall recovery  Baseline:  Wife reports she was unable to get him up on his own with fall 8 months ago  Goal status:  INITIAL   ASSESSMENT:  CLINICAL IMPRESSION: Pt with excellent motivation to participate in session. He tolerates all interventions well without significant fatigue and without pain/discomfort. He was able to advance volume of reps completed with majority of therex. He struggled the most with EC balance interventions and interventions involving gait with head turns. Pt will benefit from skilled PT to address balance, strength, gait speed and endurance for improved functional mobility and decreased fall risk.  OBJECTIVE IMPAIRMENTS: Abnormal gait, decreased balance, decreased mobility, difficulty walking, decreased strength, and postural dysfunction.   ACTIVITY LIMITATIONS: standing, squatting, transfers, and locomotion level  PARTICIPATION LIMITATIONS: community  activity and church  PERSONAL FACTORS: 3+ comorbidities: see above PMH  are also affecting patient's functional outcome.   REHAB POTENTIAL: Good  CLINICAL DECISION MAKING: Evolving/moderate complexity  EVALUATION COMPLEXITY: Moderate  PLAN:  PT FREQUENCY: 1-2x/week  PT DURATION: 6 weeks plus eval visit  PLANNED INTERVENTIONS: 97110-Therapeutic exercises, 97530- Therapeutic activity, [id number]- Neuromuscular re-education, 97535- Self Care, 02859- Manual therapy, [phone]- Gait training, Patient/Family education, Balance training, and DME instructions  PLAN FOR NEXT SESSION:   Dynamic balance and gait training. BLE strengthening.    9:35 AM, 12/16/23 Darryle Patten PT, DPT   Physical Therapist - [facility]  Outpatient Physical Therapy- Main Campus [phone]

## 2023-12-16 NOTE — Therapy (Signed)
 OUTPATIENT SPEECH LANGUAGE PATHOLOGY  TREATMENT NOTE  Patient Name: Eugene Martinez. MRN: [id number] DOB:04-Apr-1946, 78 y.o., male Today's Date: 12/16/2023  PCP: Sadie Manna, MD REFERRING PROVIDER: Paich, Kaitlin, PA-C  END OF SESSION:  End of Session - 12/16/23 0936     Visit Number 7    Number of Visits 13    Date for SLP Re-Evaluation 01/31/24    Authorization Type [employer]    Progress Note Due on Visit 10    SLP Start Time 0930    SLP Stop Time  1015    SLP Time Calculation (min) 45 min    Activity Tolerance Patient tolerated treatment well              Past Medical History:  Diagnosis Date   Allergic state    Amyloidosis (HCC)    Anemia    BPH (benign prostatic hyperplasia)    DDD (degenerative disc disease), lumbar    Dysplastic nevus 10/13/2013   Left posterior waistline paraspinal. Moderate to severe atypia, pattern borders on early evolving MIS, edge involved. Excised 03/31/2014, margins free.   Dysplastic nevus 12/19/2017   Right upper back medial. Mild atypia, lateral and deep margins involved.    Dysplastic nevus 11/28/2022   right prox lat thigh, Severe atypia, Excised 01/08/23   Dysrhythmia    Environmental and seasonal allergies    Gallop rhythm    GERD (gastroesophageal reflux disease)    Hypertension    Mild mitral regurgitation    a. by echo 10/2012   Pain    bil. 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 10/2012 Echo: EF 55-60%, severe LVH with speckling of myocardium -? amyloid. Mild MR, mod dil LA.   Past Surgical History:  Procedure Laterality Date   CIRCUMCISION  1984   COLONOSCOPY  03/2012   COLONOSCOPY WITH PROPOFOL  N/A 10/29/2017   Procedure: COLONOSCOPY WITH PROPOFOL ;  Surgeon: Gaylyn Gladis PENNER, MD;  Location: [facility] ENDOSCOPY;  Service: Endoscopy;  Laterality: N/A;   FOOT SURGERY     right foot   FOOT SURGERY Right 2017   LEG SURGERIES     HX.OF MULTIPLE SURGERIES FOR LEG LENGTH DUE TO  POLIO   LUMBAR LAMINECTOMY/DECOMPRESSION MICRODISCECTOMY N/A 01/26/2019   Procedure: LUMBAR LAMINECTOMY/DECOMPRESSION MICRODISCECTOMY 1 LEVEL L3-4;  Surgeon: Bluford Standing, MD;  Location: [facility] ORS;  Service: Neurosurgery;  Laterality: N/A;   NECK SURGERY     PILONIDAL CYST / SINUS EXCISION     Patient Active Problem List   Diagnosis Date Noted   Anemia 08/01/2020   Elevated hemoglobin A1c 08/01/2020   Loss of memory 08/01/2020   Sesamoiditis 06/25/2019   S/P lumbar laminectomy 01/26/2019   Weakness of both lower limbs 10/24/2018   LVH (left ventricular hypertrophy) 08/29/2016   Allergy 02/20/2016   Amyloidosis (HCC) 02/20/2016   Thrombocytopenia (HCC) 11/23/2015   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   Weight loss, unintentional 06/29/2013   Airway hyperreactivity 05/07/2013   Benign fibroma of prostate 05/07/2013   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 PMH of chemotherapy (2014), and polio (1954). Other PMH Primary hypertension  Elevated hemoglobin A1c  Thrombocytopenia (CMS-HCC)  Post-polio syndrome (CMS-HCC)  Other amyloidosis (CMS/HHS-HCC)  Mild intermittent asthma without complication (HHS-HCC)  Chronic diastolic heart failure (CMS/HHS-HCC  PAIN:  Are you having pain? No  FALLS: Has patient fallen in last 6 months?  See PT evaluation for details  LIVING ENVIRONMENT: Lives with: lives with their spouse Lives in:  House/apartment  PLOF:  Level of assistance: Comment: needs assistance with tasks that involve memory Employment: Retired  PATIENT GOALS: improve memory skills  SUBJECTIVE STATEMENT: Both pt and his wife appeared frustrated with each other -  Pt accompanied by: his wife  OBJECTIVE:  TODAY'S TREATMENT:                                                                                                                                         DATE:  Skilled treatment session focused on pt's cognitive communication goals: SLP facilitated session by providing the following interventions:  Pt's wife reports that pt brought his memory book but left in the car. Pt asked multiple times where his memory book was after this information was given by his wife. While she reports that she is managing her frustration better, she continued to provide examples of memory loss by pt with statement, we know it, it is something that he already knew, it is psychological, we know it with extensive education provided that pt no longer knows the information that she was referring to. Fro example, she wrote a check to church and put it in pt's Bible with expectation that pt would remember it was there.    She also reported that pill organizer was place don counter as recommended but pt took wrong medicines. Requested they bring medicines in with pill organizer to help better plan strategies for medicine adherence.   Continued education provided on need to write information down for improved pt recall even when providing basic to-do lists.    PATIENT EDUCATION: Education details: see today's treatment above Person educated: Patient and Spouse Education method: Explanation, Demonstration, and Handouts Education comprehension: verbalized understanding and needs further education   GOALS: Goals reviewed with patient? Yes  SHORT TERM GOALS: Target date: 01/03/24  Pt will demo knowledge that he needs to use  practical compensation in a situation which has been difficult in the past, x2 sessions Baseline: Goal status: INITIAL  2.  Pt and/or wife will develop a medication administration system  Baseline:  Goal status: INITIAL  3.  Pt will make it on time to 100% of appointments in a 2-week period prior to 01/03/24 using compensations Baseline:  Goal status: INITIAL  4.  Pt will complete a financial task using a memory compensation system prior to 01/03/24  Baseline:  Goal status: INITIAL   LONG TERM GOALS: Target date: 01/31/24  Pt will improve PROM compared to initial administration Baseline:  Goal status: INITIAL  2.  Pt will successfully use a medication administration system for two weeks prior to 01/31/24 Baseline:  Goal status: INITIAL  3.  Pt will make it on time to 100% of appointments in a 2-week period  after 01/03/24 using compensations Baseline:  Goal status: INITIAL  4.  Pt will complete a financial responsibility using a memory compensation system, between two sessions Baseline:  Goal status: INITIAL   ASSESSMENT:  CLINICAL IMPRESSION: Patient is a 78 y.o. M  who was seen today for cognitive communication deficit. Pt presents with a moderate primarily amnestic cognitive impairment. Pt and his wife report improved understanding of dementia and his wife reports decreasing moments of frustration.   Continued education provided on memory loss and dementia. See the above treatment note for more details.   OBJECTIVE IMPAIRMENTS: include memory. These impairments are limiting patient from managing medications, managing appointments, managing finances, household responsibilities, ADLs/IADLs, and effectively communicating at home and in community. Factors affecting potential to achieve goals and functional outcome are ability to learn/carryover information.. Patient will benefit from skilled SLP services to address above impairments and improve overall function.  REHAB  POTENTIAL: Good  PLAN:  SLP FREQUENCY: 1-2x/week  SLP DURATION: 8 weeks  PLANNED INTERVENTIONS: 92507 Treatment of speech (30 or 45 min) , Environmental controls, Cognitive reorganization, Internal/external aids, Functional tasks, Multimodal communication approach, SLP instruction and feedback, Compensatory strategies, and Patient/family education   Noelle Sease B. Rubbie, M.S., CCC-SLP, Tree Surgeon Certified Brain Injury Specialist [facility]  [facility] Rehabilitation Services Office [phone] Ascom [phone] Fax [phone]

## 2023-12-17 ENCOUNTER — Telehealth: Payer: Self-pay

## 2023-12-17 NOTE — Telephone Encounter (Signed)
 PT returned spouse/caregiver's call via secure phone line. PT informed that pt is experiencing BLE weakness, a fall, and difficulty making it to the bathroom on time. PT instructs caregiver that it would be best to have pt seen soon by physician due to current symptoms. Spouse agreeable to plan, reports to PT she will contact pt's PCP first. Darryle Patten PT, DPT

## 2023-12-19 ENCOUNTER — Ambulatory Visit: Payer: PPO | Admitting: Speech Pathology

## 2023-12-19 ENCOUNTER — Ambulatory Visit: Payer: PPO | Admitting: Physical Therapy

## 2023-12-19 DIAGNOSIS — M6281 Muscle weakness (generalized): Secondary | ICD-10-CM

## 2023-12-19 DIAGNOSIS — R41841 Cognitive communication deficit: Secondary | ICD-10-CM | POA: Diagnosis not present

## 2023-12-19 DIAGNOSIS — R2689 Other abnormalities of gait and mobility: Secondary | ICD-10-CM

## 2023-12-19 DIAGNOSIS — R262 Difficulty in walking, not elsewhere classified: Secondary | ICD-10-CM

## 2023-12-19 DIAGNOSIS — R2681 Unsteadiness on feet: Secondary | ICD-10-CM

## 2023-12-19 NOTE — Therapy (Signed)
 OUTPATIENT PHYSICAL THERAPY TREATMENT   Patient Name: Eugene Martinez. MRN: [id number] DOB:1946-08-22, 78 y.o., male Today's Date: 12/19/2023   PCP: Eugene Manna, MD REFERRING PROVIDER: Paich, Kaitlin, PA-C   END OF SESSION:  PT End of Session - 12/19/23 1501     Visit Number 7    Number of Visits 13    Date for PT Re-Evaluation 01/10/24    Authorization Type HTA    Progress Note Due on Visit 10    PT Start Time 1400    PT Stop Time 1445    PT Time Calculation (min) 45 min    Equipment Utilized During Treatment Gait belt    Activity Tolerance Patient tolerated treatment well;No increased pain    Behavior During Therapy WFL for tasks assessed/performed              Past Medical History:  Diagnosis Date   Allergic state    Amyloidosis (HCC)    Anemia    BPH (benign prostatic hyperplasia)    DDD (degenerative disc disease), lumbar    Dysplastic nevus 10/13/2013   Left posterior waistline paraspinal. Moderate to severe atypia, pattern borders on early evolving MIS, edge involved. Excised 03/31/2014, margins free.   Dysplastic nevus 12/19/2017   Right upper back medial. Mild atypia, lateral and deep margins involved.    Dysplastic nevus 11/28/2022   right prox lat thigh, Severe atypia, Excised 01/08/23   Dysrhythmia    Environmental and seasonal allergies    Gallop rhythm    GERD (gastroesophageal reflux disease)    Hypertension    Mild mitral regurgitation    a. by echo 10/2012   Pain    bil. 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 10/2012 Echo: EF 55-60%, severe LVH with speckling of myocardium -? amyloid. Mild MR, mod dil LA.   Past Surgical History:  Procedure Laterality Date   CIRCUMCISION  1984   COLONOSCOPY  03/2012   COLONOSCOPY WITH PROPOFOL  N/A 10/29/2017   Procedure: COLONOSCOPY WITH PROPOFOL ;  Surgeon: Eugene Gladis PENNER, MD;  Location: [facility] ENDOSCOPY;  Service: Endoscopy;  Laterality: N/A;   FOOT SURGERY     right  foot   FOOT SURGERY Right 2017   LEG SURGERIES     HX.OF MULTIPLE SURGERIES FOR LEG LENGTH DUE TO POLIO   LUMBAR LAMINECTOMY/DECOMPRESSION MICRODISCECTOMY N/A 01/26/2019   Procedure: LUMBAR LAMINECTOMY/DECOMPRESSION MICRODISCECTOMY 1 LEVEL L3-4;  Surgeon: Eugene Standing, MD;  Location: [facility] ORS;  Service: Neurosurgery;  Laterality: N/A;   NECK SURGERY     PILONIDAL CYST / SINUS EXCISION     Patient Active Problem List   Diagnosis Date Noted   Anemia 08/01/2020   Elevated hemoglobin A1c 08/01/2020   Loss of memory 08/01/2020   Sesamoiditis 06/25/2019   S/P lumbar laminectomy 01/26/2019   Weakness of both lower limbs 10/24/2018   LVH (left ventricular hypertrophy) 08/29/2016   Allergy 02/20/2016   Amyloidosis (HCC) 02/20/2016   Thrombocytopenia (HCC) 11/23/2015   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   Weight loss, unintentional 06/29/2013   Airway hyperreactivity 05/07/2013   Benign fibroma of prostate 05/07/2013   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   Chronic diastolic heart failure (HCC) 11/18/2012   Proteinuria, Bence Jones 11/18/2012   Multiple allergies 10/29/2012   Hypertension 10/29/2012   RBBB (right bundle branch block with left anterior fascicular block) 10/29/2012   Proteinuria 10/29/2012    ONSET DATE: 11/06/2023 (MD referral)  REFERRING DIAG:  G31.84 (ICD-10-CM) - Mild cognitive impairment of uncertain or unknown etiology   THERAPY DIAG:  Unsteadiness on feet  Difficulty in walking, not elsewhere classified  Muscle weakness (generalized)  Other abnormalities of gait and mobility  Rationale for Evaluation and Treatment: Rehabilitation  SUBJECTIVE:                                                                                                                                                                                             SUBJECTIVE STATEMENT: Pt reports no pain, his wife did report 4 falls yesterdy and pt does not recll any of these. Pt caregiver  states his legs just seemed not to be working. States this is very out of the ordinary for the patient.   Pt accompanied by: significant other, Eugene Martinez  PERTINENT HISTORY: Has been dx with MCI, fairly stable for the last 1-2 years.  PA suggested physical therapy.  Intermittent use of quad cane in the house. Pt has a new [employer] he purchased at end of December, has not used it much yet. Wife notices that R side gets weaker, this is the side that was affected by polio. 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 No  PRECAUTIONS: Fall; wears R AFO  RED FLAGS: None   WEIGHT BEARING RESTRICTIONS: No  FALLS: Has patient fallen in last 6 months? No *Wife reports fall 8 months ago and wife couldn't get him up*  LIVING ENVIRONMENT: Lives with: lives with their spouse Lives in: House/apartment Stairs:  1 step into home Has following equipment at home: Counselling psychologist and Environmental Consultant - 2 wheeled *On arts development officer for [employer] PLOF: Independent with household mobility with device and Independent with community mobility with device Enjoys yardwork, no longer does this  PATIENT GOALS: Pt's goals for therapy are to walk without falling.   OBJECTIVE:   TODAY'S TREATMENT:  DATE: 12/19/23   BP 121/61 HR 78   TE: Nustep strength/endurance training Level 1 x 2 min level 4 x 2 min  Cues from PT for consistent steps per min through varied resistance/with PT adjusting resistance level. Pt maintains SPM in 50s-60s.  STS 1x8 hands-free Ambulation with RW x 320 ft  STS x 8 hands free   Seated glute press down into Blue TB, difficulty with power generation for this 2 x 10 bilaterally     Self care/ Home management -  instruction in potential cause for falls and instruction in proper way to transition from sit to stand to allow for more safe transitions. Most of his falls yesterday were all from transition from sit to / from stand. Pt caregiver also educated that she could potentially call the fire department in an incident where she is unable to get him up where she feels it is unsafe for her to attempt to help him transition from floor to seated or Martinez.  Patient verbalizes understanding of this.  PATIENT EDUCATION: Education details: Pt educated throughout session about proper posture and technique with exercises. Improved exercise technique, movement at target joints, use of target muscles after min to mod verbal, visual, tactile cues.   Person educated: Patient and Spouse Education method: Explanation and Demonstration Education comprehension: verbalized understanding and returned demonstration  HOME EXERCISE PROGRAM: Access Code: [email or web address] URL: [email or web address] Date: 11/25/2023 Prepared by: [facility] - [facility]  Exercises - Single Leg Stance with Support  - 1 x daily - 5 x weekly - 1 sets - 3 reps - 10 sec hold - Martinez Romberg to 1/2 Tandem Stance  - 1 x daily - 5 x weekly - 1 sets - 3 reps - 15 sec hold  GOALS: Goals reviewed with patient? Yes  SHORT TERM GOALS: Target date: 12/27/2023  Pt will be supervision with HEP for improved balance, gait. Baseline: given on 12/16 Goal status: INITIAL  2.  Pt will improve TUG score to less than or equal to 15 sec for decreased fall risk. Baseline: 20.79 sec Goal status: INITIAL  3.  Pt will improve Berg score to at least 40/56 to decrease fall risk. Baseline: 35/56 Goal status: INITIAL  4.  Pt/wife will verbalize understanding of fall prevention in home environment.  Baseline:  Goal status: INITIAL   LONG TERM GOALS: Target date: 01/10/2024  Pt will be supervision with progression of HEP  for improved balance, gait. Baseline:  Goal status: INITIAL  2.  Pt will improve gait velocity to at least 1.8 ft/sec for improved gait efficiency and safety. Baseline: 1.56 ft/sec Goal status: INITIAL  3.  Pt will improve Berg score to at least 45/56 to decrease fall risk. Baseline: 35/56 Goal status: INITIAL  4.  Pt will perform floor>stand transfer with min assist for safe fall recovery  Baseline:  Wife reports she was unable to get him up on his own with fall 8 months ago  Goal status:  INITIAL   ASSESSMENT:  CLINICAL IMPRESSION:  Pt presents following 4 falls yesterday. Pt has no momory of falls but pt caregiver very concerned.  Patient seems to do better this date having less difficulty with sit to stand transitions and no falls or stumbles during activities during today's session.  Did take session and easier level to ensure physical therapy did not cause excessive fatigue secondary to his diagnosis of postpolio syndrome.  Instructed patient and caregiver to continue to watch for  the symptoms and if they recur following that a session to seek further medical advice. Pt will continue to benefit from skilled physical therapy intervention to address impairments, improve QOL, and attain therapy goals.    OBJECTIVE IMPAIRMENTS: Abnormal gait, decreased balance, decreased mobility, difficulty walking, decreased strength, and postural dysfunction.   ACTIVITY LIMITATIONS: Martinez, squatting, transfers, and locomotion level  PARTICIPATION LIMITATIONS: community activity and church  PERSONAL FACTORS: 3+ comorbidities: see above PMH  are also affecting patient's functional outcome.   REHAB POTENTIAL: Good  CLINICAL DECISION MAKING: Evolving/moderate complexity  EVALUATION COMPLEXITY: Moderate  PLAN:  PT FREQUENCY: 1-2x/week  PT DURATION: 6 weeks plus eval visit  PLANNED INTERVENTIONS: 97110-Therapeutic exercises, 97530- Therapeutic activity, [id number]- Neuromuscular re-education,  97535- Self Care, [id number]- Manual therapy, [phone]- Gait training, Patient/Family education, Balance training, and DME instructions  PLAN FOR NEXT SESSION:   Dynamic balance and gait training. BLE strengthening.    3:03 PM, 12/19/23 Lonni KATHEE Gainer PT ,DPT Physical Therapist-   [facility]

## 2023-12-19 NOTE — Therapy (Signed)
 OUTPATIENT SPEECH LANGUAGE PATHOLOGY  TREATMENT NOTE  Patient Name: Eugene Martinez. MRN: [id number] DOB:1946/02/11, 78 y.o., male Today's Date: 12/19/2023  PCP: Sadie Manna, MD REFERRING PROVIDER: Paich, Kaitlin, PA-C  END OF SESSION:  End of Session - 12/19/23 1525     Visit Number 8    Number of Visits 13    Date for SLP Re-Evaluation 01/31/24    Authorization Type [employer]    Progress Note Due on Visit 10    SLP Start Time 1445    SLP Stop Time  1515    SLP Time Calculation (min) 30 min    Activity Tolerance Patient tolerated treatment well              Past Medical History:  Diagnosis Date   Allergic state    Amyloidosis (HCC)    Anemia    BPH (benign prostatic hyperplasia)    DDD (degenerative disc disease), lumbar    Dysplastic nevus 10/13/2013   Left posterior waistline paraspinal. Moderate to severe atypia, pattern borders on early evolving MIS, edge involved. Excised 03/31/2014, margins free.   Dysplastic nevus 12/19/2017   Right upper back medial. Mild atypia, lateral and deep margins involved.    Dysplastic nevus 11/28/2022   right prox lat thigh, Severe atypia, Excised 01/08/23   Dysrhythmia    Environmental and seasonal allergies    Gallop rhythm    GERD (gastroesophageal reflux disease)    Hypertension    Mild mitral regurgitation    a. by echo 10/2012   Pain    bil. 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 10/2012 Echo: EF 55-60%, severe LVH with speckling of myocardium -? amyloid. Mild MR, mod dil LA.   Past Surgical History:  Procedure Laterality Date   CIRCUMCISION  1984   COLONOSCOPY  03/2012   COLONOSCOPY WITH PROPOFOL  N/A 10/29/2017   Procedure: COLONOSCOPY WITH PROPOFOL ;  Surgeon: Gaylyn Gladis PENNER, MD;  Location: [facility] ENDOSCOPY;  Service: Endoscopy;  Laterality: N/A;   FOOT SURGERY     right foot   FOOT SURGERY Right 2017   LEG SURGERIES     HX.OF MULTIPLE SURGERIES FOR LEG LENGTH DUE TO  POLIO   LUMBAR LAMINECTOMY/DECOMPRESSION MICRODISCECTOMY N/A 01/26/2019   Procedure: LUMBAR LAMINECTOMY/DECOMPRESSION MICRODISCECTOMY 1 LEVEL L3-4;  Surgeon: Bluford Standing, MD;  Location: [facility] ORS;  Service: Neurosurgery;  Laterality: N/A;   NECK SURGERY     PILONIDAL CYST / SINUS EXCISION     Patient Active Problem List   Diagnosis Date Noted   Anemia 08/01/2020   Elevated hemoglobin A1c 08/01/2020   Loss of memory 08/01/2020   Sesamoiditis 06/25/2019   S/P lumbar laminectomy 01/26/2019   Weakness of both lower limbs 10/24/2018   LVH (left ventricular hypertrophy) 08/29/2016   Allergy 02/20/2016   Amyloidosis (HCC) 02/20/2016   Thrombocytopenia (HCC) 11/23/2015   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   Weight loss, unintentional 06/29/2013   Airway hyperreactivity 05/07/2013   Benign fibroma of prostate 05/07/2013   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 PMH of chemotherapy (2014), and polio (1954). Other PMH Primary hypertension  Elevated hemoglobin A1c  Thrombocytopenia (CMS-HCC)  Post-polio syndrome (CMS-HCC)  Other amyloidosis (CMS/HHS-HCC)  Mild intermittent asthma without complication (HHS-HCC)  Chronic diastolic heart failure (CMS/HHS-HCC  PAIN:  Are you having pain? No  FALLS: Has patient fallen in last 6 months?  See PT evaluation for details  LIVING ENVIRONMENT: Lives with: lives with their spouse Lives in:  House/apartment  PLOF:  Level of assistance: Comment: needs assistance with tasks that involve memory Employment: Retired  PATIENT GOALS: improve memory skills  SUBJECTIVE STATEMENT: Pt's wife reports falls when attempting to stand, see PT notes for details Pt accompanied by: his wife  OBJECTIVE:  TODAY'S TREATMENT:                                                                                                                                         DATE:  Skilled treatment session focused on pt's cognitive communication goals: SLP facilitated session by providing the following interventions:  Pt's wife reports increased falls over the last 2 days, see PT note for full details, pt's PCP aware  Pt's wife also compiled a 3-ring notebook to include pt's memory book, calendar and medication list, his wife was able to recall information that was provided by PT to improve safety of transfers and she had taken notes to describe his difficulty with standing. Continued education provided on memory loss and strategies to use external memory aides.    PATIENT EDUCATION: Education details: see today's treatment above Person educated: Patient and Spouse Education method: Explanation, Demonstration, and Handouts Education comprehension: verbalized understanding and needs further education   GOALS: Goals reviewed with patient? Yes  SHORT TERM GOALS: Target date: 01/03/24  Pt will demo knowledge that he needs to use practical compensation in a situation which has been difficult in the past, x2 sessions Baseline: Goal status: INITIAL  2.  Pt and/or wife will develop a medication administration system  Baseline:  Goal status: INITIAL  3.  Pt will make it on time to 100% of appointments in a 2-week period prior to 01/03/24 using compensations Baseline:  Goal status: INITIAL  4.  Pt will complete a financial task using a memory compensation system prior to 01/03/24 Baseline:  Goal status:  INITIAL   LONG TERM GOALS: Target date: 01/31/24  Pt will improve PROM compared to initial administration Baseline:  Goal status: INITIAL  2.  Pt will successfully use a medication administration system for two weeks prior to 01/31/24 Baseline:  Goal status: INITIAL  3.  Pt will make it on time to 100% of appointments in a 2-week period  after 01/03/24 using compensations Baseline:  Goal status: INITIAL  4.  Pt will complete a financial responsibility using a  memory compensation system, between two sessions Baseline:  Goal status: INITIAL   ASSESSMENT:  CLINICAL IMPRESSION: Patient is a 78 y.o. M  who was seen today for cognitive communication deficit. Pt presents with a moderate primarily amnestic cognitive impairment. Pt and his wife report improved understanding of dementia and his wife reports decreasing moments of frustration.   Continued education provided on memory loss and dementia. See the above treatment note for more details.   OBJECTIVE IMPAIRMENTS: include memory. These impairments are limiting patient from managing medications, managing appointments, managing finances, household responsibilities, ADLs/IADLs, and effectively communicating at home and in community. Factors affecting potential to achieve goals and functional outcome are ability to learn/carryover information.. Patient will benefit from skilled SLP services to address above impairments and improve overall function.  REHAB POTENTIAL: Good  PLAN:  SLP FREQUENCY: 1-2x/week  SLP DURATION: 8 weeks  PLANNED INTERVENTIONS: 92507 Treatment of speech (30 or 45 min) , Environmental controls, Cognitive reorganization, Internal/external aids, Functional tasks, Multimodal communication approach, SLP instruction and feedback, Compensatory strategies, and Patient/family education   Antonia Jicha B. Rubbie, M.S., CCC-SLP, Tree Surgeon Certified Brain Injury Specialist [facility]  [facility] Office [phone] Ascom [phone] Fax [phone]

## 2023-12-23 ENCOUNTER — Ambulatory Visit: Payer: PPO | Admitting: Physical Therapy

## 2023-12-23 ENCOUNTER — Ambulatory Visit: Payer: PPO | Admitting: Speech Pathology

## 2023-12-23 DIAGNOSIS — R262 Difficulty in walking, not elsewhere classified: Secondary | ICD-10-CM

## 2023-12-23 DIAGNOSIS — M6281 Muscle weakness (generalized): Secondary | ICD-10-CM

## 2023-12-23 DIAGNOSIS — R2689 Other abnormalities of gait and mobility: Secondary | ICD-10-CM

## 2023-12-23 DIAGNOSIS — R41841 Cognitive communication deficit: Secondary | ICD-10-CM

## 2023-12-23 DIAGNOSIS — R2681 Unsteadiness on feet: Secondary | ICD-10-CM

## 2023-12-23 NOTE — Therapy (Signed)
 OUTPATIENT PHYSICAL THERAPY TREATMENT   Patient Name: Eugene Martinez. MRN: [id number] DOB:1946-09-09, 78 y.o., male Today's Date: 12/23/2023   PCP: Sadie Manna, MD REFERRING PROVIDER: Paich, Kaitlin, PA-C   END OF SESSION:  PT End of Session - 12/23/23 1016     Visit Number 8    Number of Visits 13    Date for PT Re-Evaluation 01/10/24    Authorization Type H[employer]    Progress Note Due on Visit 10    PT Start Time 1015    PT Stop Time 1058    PT Time Calculation (min) 43 min    Equipment Utilized During Treatment Gait belt    Activity Tolerance Patient tolerated treatment well;No increased pain    Behavior During Therapy WFL for tasks assessed/performed               Past Medical History:  Diagnosis Date   Allergic state    Amyloidosis (HCC)    Anemia    BPH (benign prostatic hyperplasia)    DDD (degenerative disc disease), lumbar    Dysplastic nevus 10/13/2013   Left posterior waistline paraspinal. Moderate to severe atypia, pattern borders on early evolving MIS, edge involved. Excised 03/31/2014, margins free.   Dysplastic nevus 12/19/2017   Right upper back medial. Mild atypia, lateral and deep margins involved.    Dysplastic nevus 11/28/2022   right prox lat thigh, Severe atypia, Excised 01/08/23   Dysrhythmia    Environmental and seasonal allergies    Gallop rhythm    GERD (gastroesophageal reflux disease)    Hypertension    Mild mitral regurgitation    a. by echo 10/2012   Pain    bil. 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 10/2012 Echo: EF 55-60%, severe LVH with speckling of myocardium -? amyloid. Mild MR, mod dil LA.   Past Surgical History:  Procedure Laterality Date   CIRCUMCISION  1984   COLONOSCOPY  03/2012   COLONOSCOPY WITH PROPOFOL  N/A 10/29/2017   Procedure: COLONOSCOPY WITH PROPOFOL ;  Surgeon: Gaylyn Gladis PENNER, MD;  Location: [facility] ENDOSCOPY;  Service: Endoscopy;  Laterality: N/A;   FOOT SURGERY     right  foot   FOOT SURGERY Right 2017   LEG SURGERIES     HX.OF MULTIPLE SURGERIES FOR LEG LENGTH DUE TO POLIO   LUMBAR LAMINECTOMY/DECOMPRESSION MICRODISCECTOMY N/A 01/26/2019   Procedure: LUMBAR LAMINECTOMY/DECOMPRESSION MICRODISCECTOMY 1 LEVEL L3-4;  Surgeon: Bluford Standing, MD;  Location: [facility] ORS;  Service: Neurosurgery;  Laterality: N/A;   NECK SURGERY     PILONIDAL CYST / SINUS EXCISION     Patient Active Problem List   Diagnosis Date Noted   Anemia 08/01/2020   Elevated hemoglobin A1c 08/01/2020   Loss of memory 08/01/2020   Sesamoiditis 06/25/2019   S/P lumbar laminectomy 01/26/2019   Weakness of both lower limbs 10/24/2018   LVH (left ventricular hypertrophy) 08/29/2016   Allergy 02/20/2016   Amyloidosis (HCC) 02/20/2016   Thrombocytopenia (HCC) 11/23/2015   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   Weight loss, unintentional 06/29/2013   Airway hyperreactivity 05/07/2013   Benign fibroma of prostate 05/07/2013   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   Chronic diastolic heart failure (HCC) 11/18/2012   Proteinuria, Bence Jones 11/18/2012   Multiple allergies 10/29/2012   Hypertension 10/29/2012   RBBB (right bundle branch block with left anterior fascicular block) 10/29/2012   Proteinuria 10/29/2012    ONSET DATE: 11/06/2023 (MD referral)  REFERRING  DIAG: G31.84 (ICD-10-CM) - Mild cognitive impairment of uncertain or unknown etiology   THERAPY DIAG:  Unsteadiness on feet  Difficulty in walking, not elsewhere classified  Muscle weakness (generalized)  Other abnormalities of gait and mobility  Rationale for Evaluation and Treatment: Rehabilitation  SUBJECTIVE:                                                                                                                                                                                             SUBJECTIVE STATEMENT: Pt and caregiver report no new falls since last visit. No episode of weakness and improved transitions from  sit to stand.   Pt accompanied by: significant other, [name]  PERTINENT HISTORY: Has been dx with MCI, fairly stable for the last 1-2 years.  PA suggested physical therapy.  Intermittent use of quad cane in the house. Pt has a new [employer] he purchased at end of December, has not used it much yet. Wife notices that R side gets weaker, this is the side that was affected by polio. 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 No  PRECAUTIONS: Fall; wears R AFO  RED FLAGS: None   WEIGHT BEARING RESTRICTIONS: No  FALLS: Has patient fallen in last 6 months? No *Wife reports fall 8 months ago and wife couldn't get him up*  LIVING ENVIRONMENT: Lives with: lives with their spouse Lives in: House/apartment Stairs:  1 step into home Has following equipment at home: Counselling psychologist and Environmental Consultant - 2 wheeled *On arts development officer for [employer] PLOF: Independent with household mobility with device and Independent with community mobility with device Enjoys yardwork, no longer does this  PATIENT GOALS: Pt's goals for therapy are to walk without falling.   OBJECTIVE:   TODAY'S TREATMENT:  DATE: 12/23/23   TE: Ambulation with SPC around exercise machines x 70 ft, cues for sequencing with cane ( tends to use cane every 2-3 gait cycles) STS 1x8 hands-free HS curls RTB x 10 ea LE STS 1x8 hands-free HS curls RTB x 10 ea LE Ambulation with RW x 160  ft cues for sequencing with cane ( tends to use cane every 2-3 gait cycles) REST Ambulation with RW x 160 cues for sequencing with cane ( tends to use cane every 2-3 gait cycles)   Seated glute press down into Green TB,  2 x 10 bilaterally, cues for power generation   Seated  hip abduction into RTB 20 x 3 sec holds       PATIENT EDUCATION: Education details: Pt educated throughout session about proper posture and technique with exercises. Improved exercise technique, movement at target joints, use of target muscles after min to mod verbal, visual, tactile cues.   Person educated: Patient and Spouse Education method: Explanation and Demonstration Education comprehension: verbalized understanding and returned demonstration  HOME EXERCISE PROGRAM: Access Code: [email or web address] URL: [email or web address] Date: 11/25/2023 Prepared by: [facility] - Outpatient  Rehab - [facility]  Exercises - Single Leg Stance with Support  - 1 x daily - 5 x weekly - 1 sets - 3 reps - 10 sec hold - Standing Romberg to 1/2 Tandem Stance  - 1 x daily - 5 x weekly - 1 sets - 3 reps - 15 sec hold  GOALS: Goals reviewed with patient? Yes  SHORT TERM GOALS: Target date: 12/27/2023  Pt will be supervision with HEP for improved balance, gait. Baseline: given on 12/16 Goal status: INITIAL  2.  Pt will improve TUG score to less than or equal to 15 sec for decreased fall risk. Baseline: 20.79 sec Goal status: INITIAL  3.  Pt will improve Berg score to at least 40/56 to decrease fall risk. Baseline: 35/56 Goal status: INITIAL  4.  Pt/wife will verbalize understanding of fall prevention in home environment.  Baseline:  Goal status: INITIAL   LONG TERM GOALS: Target date: 01/10/2024  Pt will be supervision with progression of HEP for improved balance, gait. Baseline:  Goal status: INITIAL  2.  Pt will improve gait velocity to at least 1.8 ft/sec for improved gait efficiency and safety. Baseline: 1.56 ft/sec Goal status: INITIAL  3.  Pt will improve Berg score to at least 45/56 to decrease fall risk. Baseline: 35/56 Goal status: INITIAL  4.  Pt will perform floor>stand transfer with min assist for safe fall recovery  Baseline:  Wife reports she was unable to  get him up on his own with fall 8 months ago  Goal status:  INITIAL   ASSESSMENT:  CLINICAL IMPRESSION:  Continue with lower extremity strength and endurance training from last session.  Patient presents with a straight point tall cane today and shows decreased gait speed and balance when ambulating with this compared to rolling walker.  Patient does not use cane consistently with step through gait pattern but uses it every 2-3 gait cycles and did not respond consistently to cues in order to improve cane utilization consistency.  Patient may benefit from more instruction in safe use of assistive device and potential transition to another more safe assistive device depending on progress.  Patient still not completing exercises as per prior to his for fall day but will continue to progress to this but do not want to cause excessive fatigue increasing potential risk of falls  and days per following therapy.  Pt will continue to benefit from skilled physical therapy intervention to address impairments, improve QOL, and attain therapy goals.    OBJECTIVE IMPAIRMENTS: Abnormal gait, decreased balance, decreased mobility, difficulty walking, decreased strength, and postural dysfunction.   ACTIVITY LIMITATIONS: standing, squatting, transfers, and locomotion level  PARTICIPATION LIMITATIONS: community activity and church  PERSONAL FACTORS: 3+ comorbidities: see above PMH  are also affecting patient's functional outcome.   REHAB POTENTIAL: Good  CLINICAL DECISION MAKING: Evolving/moderate complexity  EVALUATION COMPLEXITY: Moderate  PLAN:  PT FREQUENCY: 1-2x/week  PT DURATION: 6 weeks plus eval visit  PLANNED INTERVENTIONS: 97110-Therapeutic exercises, 97530- Therapeutic activity, [id number]- Neuromuscular re-education, 97535- Self Care, 02859- Manual therapy, [id number]- Gait training, Patient/Family education, Balance training, and DME instructions  PLAN FOR NEXT SESSION:   Dynamic balance and gait  training. BLE strengthening.    10:17 AM, 12/23/23 Lonni KATHEE Gainer PT ,DPT Physical Therapist- [facility]

## 2023-12-23 NOTE — Therapy (Signed)
 OUTPATIENT SPEECH LANGUAGE PATHOLOGY  TREATMENT NOTE  Patient Name: Eugene Martinez. MRN: [id number] DOB:May 14, 1946, 78 y.o., male Today's Date: 12/23/2023  PCP: Sadie Manna, MD REFERRING PROVIDER: Paich, Kaitlin, PA-C  END OF SESSION:  End of Session - 12/23/23 1559     Visit Number 9    Number of Visits 13    Date for SLP Re-Evaluation 01/31/24    Authorization Type [employer]    Progress Note Due on Visit 10    SLP Start Time 0845    SLP Stop Time  0930    SLP Time Calculation (min) 45 min    Activity Tolerance Patient tolerated treatment well              Past Medical History:  Diagnosis Date   Allergic state    Amyloidosis (HCC)    Anemia    BPH (benign prostatic hyperplasia)    DDD (degenerative disc disease), lumbar    Dysplastic nevus 10/13/2013   Left posterior waistline paraspinal. Moderate to severe atypia, pattern borders on early evolving MIS, edge involved. Excised 03/31/2014, margins free.   Dysplastic nevus 12/19/2017   Right upper back medial. Mild atypia, lateral and deep margins involved.    Dysplastic nevus 11/28/2022   right prox lat thigh, Severe atypia, Excised 01/08/23   Dysrhythmia    Environmental and seasonal allergies    Gallop rhythm    GERD (gastroesophageal reflux disease)    Hypertension    Mild mitral regurgitation    a. by echo 10/2012   Pain    bil. 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 10/2012 Echo: EF 55-60%, severe LVH with speckling of myocardium -? amyloid. Mild MR, mod dil LA.   Past Surgical History:  Procedure Laterality Date   CIRCUMCISION  1984   COLONOSCOPY  03/2012   COLONOSCOPY WITH PROPOFOL  N/A 10/29/2017   Procedure: COLONOSCOPY WITH PROPOFOL ;  Surgeon: Gaylyn Gladis PENNER, MD;  Location: [facility] ENDOSCOPY;  Service: Endoscopy;  Laterality: N/A;   FOOT SURGERY     right foot   FOOT SURGERY Right 2017   LEG SURGERIES     HX.OF MULTIPLE SURGERIES FOR LEG LENGTH DUE TO  POLIO   LUMBAR LAMINECTOMY/DECOMPRESSION MICRODISCECTOMY N/A 01/26/2019   Procedure: LUMBAR LAMINECTOMY/DECOMPRESSION MICRODISCECTOMY 1 LEVEL L3-4;  Surgeon: Bluford Standing, MD;  Location: [facility] ORS;  Service: Neurosurgery;  Laterality: N/A;   NECK SURGERY     PILONIDAL CYST / SINUS EXCISION     Patient Active Problem List   Diagnosis Date Noted   Anemia 08/01/2020   Elevated hemoglobin A1c 08/01/2020   Loss of memory 08/01/2020   Sesamoiditis 06/25/2019   S/P lumbar laminectomy 01/26/2019   Weakness of both lower limbs 10/24/2018   LVH (left ventricular hypertrophy) 08/29/2016   Allergy 02/20/2016   Amyloidosis (HCC) 02/20/2016   Thrombocytopenia (HCC) 11/23/2015   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   Weight loss, unintentional 06/29/2013   Airway hyperreactivity 05/07/2013   Benign fibroma of prostate 05/07/2013   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 PMH of chemotherapy (2014), and polio (1954). Other PMH Primary hypertension  Elevated hemoglobin A1c  Thrombocytopenia (CMS-HCC)  Post-polio syndrome (CMS-HCC)  Other amyloidosis (CMS/HHS-HCC)  Mild intermittent asthma without complication (HHS-HCC)  Chronic diastolic heart failure (CMS/HHS-HCC  PAIN:  Are you having pain? No  FALLS: Has patient fallen in last 6 months?  See PT evaluation for details  LIVING ENVIRONMENT: Lives with: lives with their spouse Lives in:  House/apartment  PLOF:  Level of assistance: Comment: needs assistance with tasks that involve memory Employment: Retired  PATIENT GOALS: improve memory skills  SUBJECTIVE STATEMENT: Pt's wife sates that pt has not fallen any over the weekend Pt accompanied by: his wife  OBJECTIVE:  TODAY'S TREATMENT:                                                                                                                                         DATE:  Skilled treatment session focused on pt's cognitive communication goals. SLP facilitated session by providing the following interventions:  Pt recognized this writer this morning and eagerly greeted me. They report that pt had not had any further falls.   Skilled verbal and written education and instruction provided to pt's wife and pt on memory loss and specific ways to prompt pt to complete tasks. Such as substituting cues - instead of have you taken your medicines? Instruction provided in stating let's take our medicines to ensure that pt takes his medicines as he continues to respond yes without having completed tasks.   SLP further facilitated by printing a monthly calendar for January and February to improve pt orientation to DOW with instruction provided to pt's wife on providing information vs asking pt what day is it? As he is not able to answer d/t memory loss.  Pt voices frustration with wife she keeps asking me, when are you going to wash the dishes? Reminder set for Alexa to remind him at end of day to wash all dishes.  They both voice understanding.    PATIENT EDUCATION: Education details: see today's treatment above Person educated: Patient and Spouse Education method: Explanation, Demonstration, and Handouts Education comprehension: verbalized understanding and needs further education   GOALS: Goals reviewed with patient? Yes  SHORT TERM GOALS: Target date: 01/03/24  Pt will demo knowledge that he needs to use  practical compensation in a situation which has been difficult in the past, x2 sessions Baseline: Goal status: INITIAL  2.  Pt and/or wife will develop a medication administration system  Baseline:  Goal status: INITIAL  3.  Pt will make it on time to 100% of appointments in a 2-week period prior to 01/03/24 using compensations Baseline:  Goal status: INITIAL  4.  Pt will complete a financial task using a memory compensation system  prior to 01/03/24 Baseline:  Goal status: INITIAL   LONG TERM GOALS: Target date: 01/31/24  Pt will improve PROM compared to initial administration Baseline:  Goal status: INITIAL  2.  Pt will successfully use a medication administration system for two weeks prior to 01/31/24 Baseline:  Goal status: INITIAL  3.  Pt will make it on time to 100% of appointments in a 2-week period  after 01/03/24 using compensations Baseline:  Goal status: INITIAL  4.  Pt will complete a financial responsibility using a memory compensation system, between two sessions Baseline:  Goal status: INITIAL   ASSESSMENT:  CLINICAL IMPRESSION: Patient is a 78 y.o. M  who was seen today for cognitive communication deficit. Pt presents with a moderate primarily amnestic cognitive impairment. Pt and his wife report improved understanding of dementia and his wife reports decreasing moments of frustration.   Continued education provided on memory loss and dementia. See the above treatment note for more details.   OBJECTIVE IMPAIRMENTS: include memory. These impairments are limiting patient from managing medications, managing appointments, managing finances, household responsibilities, ADLs/IADLs, and effectively communicating at home and in community. Factors affecting potential to achieve goals and functional outcome are ability to learn/carryover information.. Patient will benefit from skilled SLP services to address above impairments and improve overall function.  REHAB  POTENTIAL: Good  PLAN:  SLP FREQUENCY: 1-2x/week  SLP DURATION: 8 weeks  PLANNED INTERVENTIONS: 92507 Treatment of speech (30 or 45 min) , Environmental controls, Cognitive reorganization, Internal/external aids, Functional tasks, Multimodal communication approach, SLP instruction and feedback, Compensatory strategies, and Patient/family education   Cobin Cadavid B. Rubbie, M.S., CCC-SLP, Tree Surgeon Certified Brain Injury Specialist [facility]  [facility] Rehabilitation Services Office [phone] Ascom [phone] Fax [phone]

## 2023-12-26 ENCOUNTER — Ambulatory Visit: Payer: PPO | Admitting: Speech Pathology

## 2023-12-26 ENCOUNTER — Ambulatory Visit: Payer: PPO | Admitting: Physical Therapy

## 2023-12-26 DIAGNOSIS — R41841 Cognitive communication deficit: Secondary | ICD-10-CM | POA: Diagnosis not present

## 2023-12-26 DIAGNOSIS — R262 Difficulty in walking, not elsewhere classified: Secondary | ICD-10-CM

## 2023-12-26 DIAGNOSIS — M6281 Muscle weakness (generalized): Secondary | ICD-10-CM

## 2023-12-26 DIAGNOSIS — R2681 Unsteadiness on feet: Secondary | ICD-10-CM

## 2023-12-26 DIAGNOSIS — R2689 Other abnormalities of gait and mobility: Secondary | ICD-10-CM

## 2023-12-26 NOTE — Therapy (Signed)
OUTPATIENT PHYSICAL THERAPY TREATMENT   Patient Name: Eugene Martinez. MRN: [id number] DOB:23-Sep-1946, 78 y.o., male Today's Date: 12/26/2023   PCP: Barbette Reichmann, MD REFERRING PROVIDER: Janice Coffin, PA-C   END OF SESSION:  PT End of Session - 12/26/23 1536     Visit Number 9    Number of Visits 13    Date for PT Re-Evaluation 01/10/24    Authorization Type HTA    Progress Note Due on Visit 10    PT Start Time 1535    PT Stop Time 1615    PT Time Calculation (min) 40 min    Equipment Utilized During Treatment Gait belt    Activity Tolerance Patient tolerated treatment well;No increased pain    Behavior During Therapy WFL for tasks assessed/performed               Past Medical History:  Diagnosis Date   Allergic state    Amyloidosis (HCC)    Anemia    BPH (benign prostatic hyperplasia)    DDD (degenerative disc disease), lumbar    Dysplastic nevus 10/13/2013   Left posterior waistline paraspinal. Moderate to severe atypia, pattern borders on early evolving MIS, edge involved. Excised 03/31/2014, margins free.   Dysplastic nevus 12/19/2017   Right upper back medial. Mild atypia, lateral and deep margins involved.    Dysplastic nevus 11/28/2022   right prox lat thigh, Severe atypia, Excised 01/08/23   Dysrhythmia    Environmental and seasonal allergies    Gallop rhythm    GERD (gastroesophageal reflux disease)    Hypertension    Mild mitral regurgitation    a. by echo 10/2012   Pain    bil. 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 10/2012 Echo: EF 55-60%, severe LVH with speckling of myocardium -? amyloid. Mild MR, mod dil LA.   Past Surgical History:  Procedure Laterality Date   CIRCUMCISION  1984   COLONOSCOPY  03/2012   COLONOSCOPY WITH PROPOFOL N/A 10/29/2017   Procedure: COLONOSCOPY WITH PROPOFOL;  Surgeon: Christena Deem, MD;  Location: [facility] ENDOSCOPY;  Service: Endoscopy;  Laterality: N/A;   FOOT SURGERY     right  foot   FOOT SURGERY Right 2017   LEG SURGERIES     HX.OF MULTIPLE SURGERIES FOR LEG LENGTH DUE TO POLIO   LUMBAR LAMINECTOMY/DECOMPRESSION MICRODISCECTOMY N/A 01/26/2019   Procedure: LUMBAR LAMINECTOMY/DECOMPRESSION MICRODISCECTOMY 1 LEVEL L3-4;  Surgeon: Lucy Chris, MD;  Location: [facility] ORS;  Service: Neurosurgery;  Laterality: N/A;   NECK SURGERY     PILONIDAL CYST / SINUS EXCISION     Patient Active Problem List   Diagnosis Date Noted   Anemia 08/01/2020   Elevated hemoglobin A1c 08/01/2020   Loss of memory 08/01/2020   Sesamoiditis 06/25/2019   S/P lumbar laminectomy 01/26/2019   Weakness of both lower limbs 10/24/2018   LVH (left ventricular hypertrophy) 08/29/2016   Allergy 02/20/2016   Amyloidosis (HCC) 02/20/2016   Thrombocytopenia (HCC) 11/23/2015   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   Weight loss, unintentional 06/29/2013   Airway hyperreactivity 05/07/2013   Benign fibroma of prostate 05/07/2013   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   Chronic diastolic heart failure (HCC) 11/18/2012   Proteinuria, Bence Jones 11/18/2012   Multiple allergies 10/29/2012   Hypertension 10/29/2012   RBBB (right bundle branch block with left anterior fascicular block) 10/29/2012   Proteinuria 10/29/2012    ONSET DATE: 11/06/2023 (MD referral)  REFERRING  DIAG: G31.84 (ICD-10-CM) - Mild cognitive impairment of uncertain or unknown etiology   THERAPY DIAG:  Cognitive communication deficit  Unsteadiness on feet  Difficulty in walking, not elsewhere classified  Muscle weakness (generalized)  Other abnormalities of gait and mobility  Rationale for Evaluation and Treatment: Rehabilitation  SUBJECTIVE:                                                                                                                                                                                             SUBJECTIVE STATEMENT:  Pt and caregiver report slide to floor leaning against wall in the  afternoon following last PT treatment. Required assist from nephew to return to standing. No other new issues or injury.     Pt accompanied by: significant other, [name]  PERTINENT HISTORY: Has been dx with MCI, fairly stable for the last 1-2 years.  PA suggested physical therapy.  Intermittent use of quad cane in the house. Pt has a new [employer] he purchased at end of December, has not used it much yet. Wife notices that R side gets weaker, this is the side that was affected by polio. 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 No  PRECAUTIONS: Fall; wears R AFO  RED FLAGS: None   WEIGHT BEARING RESTRICTIONS: No  FALLS: Has patient fallen in last 6 months? No *Wife reports fall 8 months ago and wife couldn't get him up*  LIVING ENVIRONMENT: Lives with: lives with their spouse Lives in: House/apartment Stairs:  1 step into home Has following equipment at home: Counselling psychologist and Environmental consultant - 2 wheeled *On Arts development officer for [employer] PLOF: Independent with household mobility with device and Independent with community mobility with device Enjoys yardwork, no longer does this  PATIENT GOALS: Pt's goals for therapy are to walk without falling.   OBJECTIVE:   TODAY'S TREATMENT:  DATE: 12/26/23    NMR:  Gait with QC 2 x 162ft with supervision assist. Min cues for improved step length .  Standing on airex pad.  Reciprocal Foot tap x 10 bil with UE support on rail x 8 bil without UE support  Lateral Foot tap x 8 bil with single UE support and x 8 bil without UE support  Standing in semitandem with 1 foot on pad and 1 foot on 6 inch step.  Forward/reverse gait. 69ft x 4  Side stepping R and L 11ft with  UE support x 43 bil and x 3 bil without UE support   CGA-min assist from PT throughout with min-mod assist intermittently on airex pad due to posterior LOB and very poor righting reactions. Cues for improved ankle strategy to correct LOB with poor carry over.   PATIENT EDUCATION: Education details: Pt educated throughout session about proper posture and technique with exercises. Improved exercise technique, movement at target joints, use of target muscles after min to mod verbal, visual, tactile cues.   Person educated: Patient and Spouse Education method: Explanation and Demonstration Education comprehension: verbalized understanding and returned demonstration  HOME EXERCISE PROGRAM: Access Code: [email or web address] URL: [email or web address] Date: 11/25/2023 Prepared by: [facility] - Outpatient  Rehab - [facility]  Exercises - Single Leg Stance with Support  - 1 x daily - 5 x weekly - 1 sets - 3 reps - 10 sec hold - Standing Romberg to 1/2 Tandem Stance  - 1 x daily - 5 x weekly - 1 sets - 3 reps - 15 sec hold  GOALS: Goals reviewed with patient? Yes  SHORT TERM GOALS: Target date: 12/27/2023  Pt will be supervision with HEP for improved balance, gait. Baseline: given on 12/16 Goal status: INITIAL  2.  Pt will improve TUG score to less than or equal to 15 sec for decreased fall risk. Baseline: 20.79 sec Goal status: INITIAL  3.  Pt will improve Berg score to at least 40/56 to decrease fall risk. Baseline: 35/56 Goal status: INITIAL  4.  Pt/wife will verbalize understanding of fall prevention in home environment.  Baseline:  Goal status: INITIAL   LONG TERM GOALS: Target date: 01/10/2024  Pt will be supervision with progression of HEP for improved balance, gait. Baseline:  Goal status: INITIAL  2.  Pt will improve gait velocity to at least 1.8 ft/sec for improved gait efficiency and safety. Baseline: 1.56 ft/sec Goal status: INITIAL  3.  Pt will improve  Berg score to at least 45/56 to decrease fall risk. Baseline: 35/56 Goal status: INITIAL  4.  Pt will perform floor>stand transfer with min assist for safe fall recovery  Baseline:  Wife reports she was unable to get him up on his own with fall 8 months ago  Goal status:  INITIAL   ASSESSMENT:  CLINICAL IMPRESSION:  Continue with dynamic balance training and gait training. Reduced intensity on this day given multiple recent falls following PT treatment, possibly from delayed recovery in setting of post polio syndrome. Noted to have poor righting reactions on unstable surface of airex pad with intermittent min-mod assist to prevent posterior LOB. Tolerated dynamic gait training well with no LOB.  Pt will continue to benefit from skilled physical therapy intervention to address impairments, improve QOL, and attain therapy goals.    OBJECTIVE IMPAIRMENTS: Abnormal gait, decreased balance, decreased mobility, difficulty walking, decreased strength, and postural dysfunction.   ACTIVITY LIMITATIONS: standing, squatting, transfers, and locomotion level  PARTICIPATION LIMITATIONS: community  activity and church  PERSONAL FACTORS: 3+ comorbidities: see above PMH  are also affecting patient's functional outcome.   REHAB POTENTIAL: Good  CLINICAL DECISION MAKING: Evolving/moderate complexity  EVALUATION COMPLEXITY: Moderate  PLAN:  PT FREQUENCY: 1-2x/week  PT DURATION: 6 weeks plus eval visit  PLANNED INTERVENTIONS: 97110-Therapeutic exercises, 97530- Therapeutic activity, [id number]- Neuromuscular re-education, 97535- Self Care, [id number]- Manual therapy, [phone]- Gait training, Patient/Family education, Balance training, and DME instructions  PLAN FOR NEXT SESSION:   Dynamic balance and gait training.  BLE strengthening with prolonged therapeutic rest breaks. Marland Kitchen    3:37 PM, 12/26/23 Golden Pop PT ,DPT Physical Therapist- [place]  [facility] [address]

## 2023-12-26 NOTE — Therapy (Signed)
OUTPATIENT SPEECH LANGUAGE PATHOLOGY  TREATMENT NOTE 10th VISIT PROGRESS NOTE   Patient Name: Eugene Martinez. MRN: [id number] DOB:05-29-1946, 78 y.o., male Today's Date: 12/26/2023  PCP: Barbette Reichmann, MD REFERRING PROVIDER: Janice Coffin, PA-C  Speech Therapy Progress Note  Dates of Reporting Period: 11/25/2023 to 12/26/2023  Objective: Patient has been seen for 10 speech therapy sessions this reporting period targeting his moderate amnestic cognitive impairment. Patient is making progress toward LTGs and met 4 STGs this reporting period. See skilled intervention, clinical impressions, and goals below for details.   END OF SESSION:  End of Session - 12/26/23 1430     Visit Number 10    Number of Visits 13    Date for SLP Re-Evaluation 01/31/24    Authorization Type [employer]    Progress Note Due on Visit 10    SLP Start Time 1445    SLP Stop Time  1530    SLP Time Calculation (min) 45 min    Activity Tolerance Patient tolerated treatment well              Past Medical History:  Diagnosis Date   Allergic state    Amyloidosis (HCC)    Anemia    BPH (benign prostatic hyperplasia)    DDD (degenerative disc disease), lumbar    Dysplastic nevus 10/13/2013   Left posterior waistline paraspinal. Moderate to severe atypia, pattern borders on early evolving MIS, edge involved. Excised 03/31/2014, margins free.   Dysplastic nevus 12/19/2017   Right upper back medial. Mild atypia, lateral and deep margins involved.    Dysplastic nevus 11/28/2022   right prox lat thigh, Severe atypia, Excised 01/08/23   Dysrhythmia    Environmental and seasonal allergies    Gallop rhythm    GERD (gastroesophageal reflux disease)    Hypertension    Mild mitral regurgitation    a. by echo 10/2012   Pain    bil. 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 10/2012 Echo: EF 55-60%, severe LVH with speckling of myocardium -? amyloid. Mild MR, mod dil LA.    Past Surgical History:  Procedure Laterality Date   CIRCUMCISION  1984   COLONOSCOPY  03/2012   COLONOSCOPY WITH PROPOFOL N/A 10/29/2017   Procedure: COLONOSCOPY WITH PROPOFOL;  Surgeon: Christena Deem, MD;  Location: [facility] ENDOSCOPY;  Service: Endoscopy;  Laterality: N/A;   FOOT SURGERY     right foot   FOOT SURGERY Right 2017   LEG SURGERIES     HX.OF MULTIPLE SURGERIES FOR LEG LENGTH DUE TO POLIO   LUMBAR LAMINECTOMY/DECOMPRESSION MICRODISCECTOMY N/A 01/26/2019   Procedure: LUMBAR LAMINECTOMY/DECOMPRESSION MICRODISCECTOMY 1 LEVEL L3-4;  Surgeon: Lucy Chris, MD;  Location: [facility] ORS;  Service: Neurosurgery;  Laterality: N/A;   NECK SURGERY     PILONIDAL CYST / SINUS EXCISION     Patient Active Problem List   Diagnosis Date Noted   Anemia 08/01/2020   Elevated hemoglobin A1c 08/01/2020   Loss of memory 08/01/2020   Sesamoiditis 06/25/2019   S/P lumbar laminectomy 01/26/2019   Weakness of both lower limbs 10/24/2018   LVH (left ventricular hypertrophy) 08/29/2016   Allergy 02/20/2016   Amyloidosis (HCC) 02/20/2016   Thrombocytopenia (HCC) 11/23/2015   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   Weight loss, unintentional 06/29/2013   Airway hyperreactivity 05/07/2013   Benign fibroma of prostate 05/07/2013   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 PMH of chemotherapy (2014), and polio (1954). Other PMH Primary  hypertension  Elevated hemoglobin A1c  Thrombocytopenia (CMS-HCC)  Post-polio syndrome (CMS-HCC)  Other amyloidosis (CMS/HHS-HCC)  Mild intermittent asthma without complication (HHS-HCC)  Chronic diastolic heart failure (CMS/HHS-HCC  PAIN:  Are you having pain? No  FALLS: Has patient fallen in last 6 months?  See PT evaluation for details  LIVING ENVIRONMENT: Lives with: lives with their spouse Lives in: House/apartment  PLOF:  Level of assistance: Comment: needs assistance with tasks that involve memory Employment: Retired  PATIENT GOALS: improve memory skills  SUBJECTIVE STATEMENT: "I think we are doing better" Pt accompanied by: his wife  OBJECTIVE:  TODAY'S TREATMENT:                                                                                                                                         DATE:  Skilled treatment session focused on pt's cognitive communication goals. SLP facilitated session by providing the following interventions:  Pt's wife reports fall following PT on the porch on Monday. Wife didn't see fall, found pt on porch floor.   SLP provided moderate assistance for caregiver support to change questions into statements to garner pt support instead of asking pt to recall information as he is not able to recall and pt becomes frustrated when he can't recall information.   PATIENT EDUCATION: Education details: see "today's treatment" above Person educated: Patient and Spouse Education method: Explanation, Demonstration, and Handouts Education comprehension: verbalized understanding and needs further education   GOALS: Goals reviewed with patient? Yes  SHORT TERM GOALS: Target date: 01/03/24 Updated 12/26/2023 Pt will demo knowledge that he needs to use practical compensation in a situation which has been difficult in the past, x2 sessions Baseline: Goal status: INITIAL: MET  2.  Pt and/or wife will develop a medication administration system   Baseline:  Goal status: INITIAL: MET  3.  Pt will make it on time to 100% of appointments in a 2-week period prior to 01/03/24 using compensations Baseline:  Goal status: INITIAL: MET  4.  Pt will complete a financial task using a memory compensation system prior to 01/03/24 Baseline:  Goal status: INITIAL: MET   LONG TERM GOALS: Target date: 01/31/24 Updated 12/26/2023 Pt will improve PROM compared to initial administration Baseline:  Goal status: INITIAL: progress made  2.  Pt will successfully use a medication administration system for two weeks prior  to 01/31/24 Baseline:  Goal status: INITIAL: MET  3.  Pt will make it on time to 100% of appointments in a 2-week period  after 01/03/24 using compensations Baseline:  Goal status: INITIAL: MET  4.  Pt will complete a financial responsibility using a memory compensation system, between two sessions Baseline:  Goal status: INITIAL: progress made   ASSESSMENT:  CLINICAL IMPRESSION: Patient is a 78 y.o. M  who was seen today for cognitive communication deficit. Pt presents with a moderate primarily amnestic cognitive impairment. Pt and his wife report improved understanding of dementia and his wife reports decreasing moments of frustration.   Continued education provided on memory loss and dementia with pt and his wife making progress towards STG and LTGs. See the above treatment note for more details.   OBJECTIVE IMPAIRMENTS: include memory. These impairments are limiting patient from managing medications, managing appointments, managing finances, household responsibilities, ADLs/IADLs, and effectively communicating at home and in community. Factors affecting potential to achieve goals and functional outcome are ability to learn/carryover information.. Patient will benefit from skilled SLP services to address above impairments and improve overall function.  REHAB POTENTIAL: Good  PLAN:  SLP FREQUENCY: 1-2x/week  SLP  DURATION: 8 weeks  PLANNED INTERVENTIONS: 92507 Treatment of speech (30 or 45 min) , Environmental controls, Cognitive reorganization, Internal/external aids, Functional tasks, Multimodal communication approach, SLP instruction and feedback, Compensatory strategies, and Patient/family education   Caleb Prigmore B. Dreama Saa, M.S., CCC-SLP, Tree surgeon Certified Brain Injury Specialist [facility]  [facility] Office [phone] Ascom [phone] Fax [phone]

## 2023-12-26 NOTE — Therapy (Signed)
OUTPATIENT PHYSICAL THERAPY TREATMENT/ Physical Therapy Progress Note   Dates of reporting period  11/25/23   to   12/30/23     Patient Name: Eugene Martinez. MRN: [id number] DOB:1946-07-19, 78 y.o., male Today's Date: 12/30/2023   PCP: Barbette Reichmann, MD REFERRING PROVIDER: Janice Coffin, PA-C   END OF SESSION:  PT End of Session - 12/30/23 1114     Visit Number 10    Number of Visits 13    Date for PT Re-Evaluation 01/10/24    Authorization Type [employer]    Progress Note Due on Visit 10    PT Start Time 1146    PT Stop Time 1229    PT Time Calculation (min) 43 min    Equipment Utilized During Treatment Gait belt    Activity Tolerance Patient tolerated treatment well;No increased pain    Behavior During Therapy WFL for tasks assessed/performed                Past Medical History:  Diagnosis Date   Allergic state    Amyloidosis (HCC)    Anemia    BPH (benign prostatic hyperplasia)    DDD (degenerative disc disease), lumbar    Dysplastic nevus 10/13/2013   Left posterior waistline paraspinal. Moderate to severe atypia, pattern borders on early evolving MIS, edge involved. Excised 03/31/2014, margins free.   Dysplastic nevus 12/19/2017   Right upper back medial. Mild atypia, lateral and deep margins involved.    Dysplastic nevus 11/28/2022   right prox lat thigh, Severe atypia, Excised 01/08/23   Dysrhythmia    Environmental and seasonal allergies    Gallop rhythm    GERD (gastroesophageal reflux disease)    Hypertension    Mild mitral regurgitation    a. by echo 10/2012   Pain    bil. 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 10/2012 Echo: EF 55-60%, severe LVH with speckling of myocardium -? amyloid. Mild MR, mod dil LA.   Past Surgical History:  Procedure Laterality Date   CIRCUMCISION  1984   COLONOSCOPY  03/2012   COLONOSCOPY WITH PROPOFOL N/A 10/29/2017   Procedure: COLONOSCOPY WITH PROPOFOL;  Surgeon: Christena Deem,  MD;  Location: [facility] ENDOSCOPY;  Service: Endoscopy;  Laterality: N/A;   FOOT SURGERY     right foot   FOOT SURGERY Right 2017   LEG SURGERIES     HX.OF MULTIPLE SURGERIES FOR LEG LENGTH DUE TO POLIO   LUMBAR LAMINECTOMY/DECOMPRESSION MICRODISCECTOMY N/A 01/26/2019   Procedure: LUMBAR LAMINECTOMY/DECOMPRESSION MICRODISCECTOMY 1 LEVEL L3-4;  Surgeon: Lucy Chris, MD;  Location: [facility] ORS;  Service: Neurosurgery;  Laterality: N/A;   NECK SURGERY     PILONIDAL CYST / SINUS EXCISION     Patient Active Problem List   Diagnosis Date Noted   Anemia 08/01/2020   Elevated hemoglobin A1c 08/01/2020   Loss of memory 08/01/2020   Sesamoiditis 06/25/2019   S/P lumbar laminectomy 01/26/2019   Weakness of both lower limbs 10/24/2018   LVH (left ventricular hypertrophy) 08/29/2016   Allergy 02/20/2016   Amyloidosis (HCC) 02/20/2016   Thrombocytopenia (HCC) 11/23/2015   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   Weight loss, unintentional 06/29/2013   Airway hyperreactivity 05/07/2013   Benign fibroma of prostate 05/07/2013   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   Chronic diastolic heart failure (HCC) 11/18/2012   Proteinuria, Bence Jones 11/18/2012   Multiple allergies 10/29/2012   Hypertension 10/29/2012   RBBB (right bundle branch  block with left anterior fascicular block) 10/29/2012   Proteinuria 10/29/2012    ONSET DATE: 11/06/2023 (MD referral)  REFERRING DIAG: G31.84 (ICD-10-CM) - 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 Has been better about  drinking electrolytes.   Pt accompanied by: significant other, [name]  PERTINENT HISTORY: Has been dx with MCI, fairly stable for the last 1-2 years.  PA suggested physical therapy.  Intermittent use of quad cane in the house. Pt has a new [employer] he purchased at end of December, has not used it much yet. Wife notices that R side gets weaker, this is the side that was affected by polio. 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 No  PRECAUTIONS: Fall; wears R AFO  RED FLAGS: None   WEIGHT BEARING RESTRICTIONS: No  FALLS: Has patient fallen in last 6 months? No *Wife reports fall 8 months ago and wife couldn't get him up*  LIVING ENVIRONMENT: Lives with: lives with their spouse Lives in: House/apartment Stairs:  1 step into home Has following equipment at home: Counselling psychologist and Environmental consultant - 2 wheeled *On Arts development officer for [employer] PLOF: Independent with household mobility with device and Independent with community mobility with device Enjoys yardwork, no longer does this  PATIENT GOALS: Pt's goals for therapy are to walk without falling.   OBJECTIVE:   TODAY'S TREATMENT:  DATE: 12/30/23 Physical therapy treatment session today consisted of completing assessment of goals and administration of testing as demonstrated and documented in flow sheet, treatment, and goals section of this note. Addition treatments may be found below.    Treatment: Wall posture 1x 60 seconds Wall march challenging to keep extension 10x STS Contract relax RLE 15x against PT leg GTB hamstring curl 15x GTB abduction 15x    PATIENT EDUCATION: Education details: Pt educated throughout session about proper posture and  technique with exercises. Improved exercise technique, movement at target joints, use of target muscles after min to mod verbal, visual, tactile cues.   Person educated: Patient and Spouse Education method: Explanation and Demonstration Education comprehension: verbalized understanding and returned demonstration  HOME EXERCISE PROGRAM: Access Code: [email or web address] URL: [email or web address] Date: 11/25/2023 Prepared by: [facility] [place] - Outpatient  Rehab - [facility]  Exercises - Single Leg Stance with Support  - 1 x daily - 5 x weekly - 1 sets - 3 reps - 10 sec hold - Standing Romberg to 1/2 Tandem Stance  - 1 x daily - 5 x weekly - 1 sets - 3 reps - 15 sec hold  GOALS: Goals reviewed with patient? Yes  SHORT TERM GOALS: Target date: 12/27/2023  Pt will be supervision with HEP for improved balance, gait. Baseline: given on 12/16 1/20: intermittent compliance Goal status: Ongoing  2.  Pt will improve TUG score to less than or equal to 15 sec for decreased fall risk. Baseline: 20.79 sec 1/20: 19 seconds no AD  Goal status: ongoing   3.  Pt will improve Berg score to at least 40/56 to decrease fall risk. Baseline: 35/56 1/20: 38/56 Goal status: Ongoing  4.  Pt/wife will verbalize understanding of fall prevention in home environment.  Baseline: 1/20: fall prevention steps taken Goal status:MET   LONG TERM GOALS: Target date: 01/10/2024  Pt will be supervision with progression of HEP for improved balance, gait. Baseline: 1/20: wife reports intermittent compliance  Goal status: ongoing  2.  Pt will improve gait velocity to at least >1.0 m/s for improved gait efficiency and safety during 10 MWT Baseline: 1.56 ft/sec 1/20: 0.58 m/s  Goal status: partially Met  3.  Pt will improve Berg score to at least 45/56 to decrease fall risk. Baseline: 35/56 1/20: 38/56  Goal status: Ongoing  4.  Pt will perform floor>stand transfer with min assist for safe fall  recovery  Baseline:  Wife reports she was unable to get him up on his own with fall 8 months ago 1/20: requires assistance for getting up after a fall.   Goal status: ongoing   ASSESSMENT:  CLINICAL IMPRESSION:  Patient's goals demonstrate improvement towards functional goals. He is ambulating more quickly and is more steady as can be seen in long term and short term goals.  Patient's condition has the potential to improve in response to therapy. Maximum improvement is yet to be obtained. The anticipated improvement is attainable and reasonable in a generally predictable time.   Pt will continue to benefit from skilled physical therapy intervention to address impairments, improve QOL, and attain therapy goals.    OBJECTIVE IMPAIRMENTS: Abnormal gait, decreased balance, decreased mobility, difficulty walking, decreased strength, and postural dysfunction.   ACTIVITY LIMITATIONS: standing, squatting, transfers, and locomotion level  PARTICIPATION LIMITATIONS: community activity and church  PERSONAL FACTORS: 3+ comorbidities: see above PMH  are also affecting patient's functional outcome.   REHAB POTENTIAL: Good  CLINICAL DECISION MAKING: Evolving/moderate complexity  EVALUATION  COMPLEXITY: Moderate  PLAN:  PT FREQUENCY: 1-2x/week  PT DURATION: 6 weeks plus eval visit  PLANNED INTERVENTIONS: 97110-Therapeutic exercises, 97530- Therapeutic activity, [id number]- Neuromuscular re-education, 97535- Self Care, [id number]- Manual therapy, [id number]- Gait training, Patient/Family education, Balance training, and DME instructions  PLAN FOR NEXT SESSION:   Dynamic balance and gait training. BLE strengthening.    2:08 PM, 12/30/23 Precious Bard PT ,DPT Physical Therapist- [place]  [facility]

## 2023-12-30 ENCOUNTER — Ambulatory Visit: Payer: PPO

## 2023-12-30 ENCOUNTER — Ambulatory Visit: Payer: PPO | Admitting: Speech Pathology

## 2023-12-30 DIAGNOSIS — R262 Difficulty in walking, not elsewhere classified: Secondary | ICD-10-CM

## 2023-12-30 DIAGNOSIS — M6281 Muscle weakness (generalized): Secondary | ICD-10-CM

## 2023-12-30 DIAGNOSIS — R41841 Cognitive communication deficit: Secondary | ICD-10-CM | POA: Diagnosis not present

## 2023-12-30 DIAGNOSIS — R2681 Unsteadiness on feet: Secondary | ICD-10-CM

## 2023-12-30 NOTE — Therapy (Signed)
OUTPATIENT SPEECH LANGUAGE PATHOLOGY  TREATMENT NOTE   Patient Name: Eugene Martinez. MRN: [id number] DOB:[birth year], 78 y.o., male Today's Date: 12/30/2023  PCP: Barbette Reichmann, MD REFERRING PROVIDER: Janice Coffin, PA-C    END OF SESSION:  End of Session - 12/30/23 1109     Visit Number 11    Number of Visits 13    Date for SLP Re-Evaluation 01/31/24    Authorization Type [employer]    Progress Note Due on Visit 20    SLP Start Time 1100    SLP Stop Time  1145    SLP Time Calculation (min) 45 min    Activity Tolerance Patient tolerated treatment well              Past Medical History:  Diagnosis Date   Allergic state    Amyloidosis (HCC)    Anemia    BPH (benign prostatic hyperplasia)    DDD (degenerative disc disease), lumbar    Dysplastic nevus 10/13/2013   Left posterior waistline paraspinal. Moderate to severe atypia, pattern borders on early evolving MIS, edge involved. Excised 03/31/2014, margins free.   Dysplastic nevus 12/19/2017   Right upper back medial. Mild atypia, lateral and deep margins involved.    Dysplastic nevus 11/28/2022   right prox lat thigh, Severe atypia, Excised 01/08/23   Dysrhythmia    Environmental and seasonal allergies    Gallop rhythm    GERD (gastroesophageal reflux disease)    Hypertension    Mild mitral regurgitation    a. by echo 10/2012   Pain    bil. 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 10/2012 Echo: EF 55-60%, severe LVH with speckling of myocardium -? amyloid. Mild MR, mod dil LA.   Past Surgical History:  Procedure Laterality Date   CIRCUMCISION  1984   COLONOSCOPY  03/2012   COLONOSCOPY WITH PROPOFOL N/A 10/29/2017   Procedure: COLONOSCOPY WITH PROPOFOL;  Surgeon: Christena Deem, MD;  Location: [facility] ENDOSCOPY;  Service: Endoscopy;  Laterality: N/A;   FOOT SURGERY     right foot   FOOT SURGERY Right 2017   LEG SURGERIES     HX.OF MULTIPLE SURGERIES FOR LEG LENGTH  DUE TO POLIO   LUMBAR LAMINECTOMY/DECOMPRESSION MICRODISCECTOMY N/A 01/26/2019   Procedure: LUMBAR LAMINECTOMY/DECOMPRESSION MICRODISCECTOMY 1 LEVEL L3-4;  Surgeon: Lucy Chris, MD;  Location: [facility] ORS;  Service: Neurosurgery;  Laterality: N/A;   NECK SURGERY     PILONIDAL CYST / SINUS EXCISION     Patient Active Problem List   Diagnosis Date Noted   Anemia 08/01/2020   Elevated hemoglobin A1c 08/01/2020   Loss of memory 08/01/2020   Sesamoiditis 06/25/2019   S/P lumbar laminectomy 01/26/2019   Weakness of both lower limbs 10/24/2018   LVH (left ventricular hypertrophy) 08/29/2016   Allergy 02/20/2016   Amyloidosis (HCC) 02/20/2016   Thrombocytopenia (HCC) 11/23/2015   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   Weight loss, unintentional 06/29/2013   Airway hyperreactivity 05/07/2013   Benign fibroma of prostate 05/07/2013   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 PMH of chemotherapy (2014), and polio (1954). Other PMH Primary hypertension  Elevated hemoglobin A1c  Thrombocytopenia (CMS-HCC)  Post-polio syndrome (CMS-HCC)  Other amyloidosis (CMS/HHS-HCC)  Mild intermittent asthma without complication (HHS-HCC)  Chronic diastolic heart failure (CMS/HHS-HCC  PAIN:  Are you having pain? No  FALLS: Has patient fallen in last 6 months?  See PT evaluation for details  LIVING ENVIRONMENT: Lives with: lives with their  spouse Lives in: House/apartment  PLOF:  Level of assistance: Comment: needs assistance with tasks that involve memory Employment: Retired  PATIENT GOALS: improve memory skills  SUBJECTIVE STATEMENT: They report having several really good days Pt accompanied by: his wife  OBJECTIVE:  TODAY'S TREATMENT:                                                                                                                                         DATE:  Skilled treatment session focused on pt's cognitive communication goals. SLP facilitated session by providing the following interventions:  Pt and his wife report good progress towards bladder training, use of memory book and use of statement rather than questions for information giving. His wife mentions that she instructed pt to remain sedentary while she ran to the store only to return to find him sweeping and performing tasks around the house. Would recommend that pt accompany her to run errands or they begin utilizing family for increase supervision given the severity of pt's memory impairment.   PATIENT EDUCATION: Education details: see "today's treatment" above Person educated: Patient and Spouse Education method: Explanation, Demonstration, and Handouts Education comprehension: verbalized understanding and needs further education   GOALS: Goals reviewed with patient? Yes  SHORT TERM GOALS: Target date: 01/03/24 Updated 12/26/2023 Pt will demo knowledge that he needs to use practical compensation in a situation which has been difficult in the past, x2 sessions Baseline: Goal status: INITIAL: MET  2.  Pt and/or wife will develop a medication administration system  Baseline:  Goal status: INITIAL: MET  3.  Pt will make it on time to 100% of appointments in a 2-week period prior to 01/03/24 using compensations Baseline:  Goal status: INITIAL: MET  4.  Pt will complete a financial task using a memory compensation system prior to  01/03/24 Baseline:  Goal status: INITIAL: MET   LONG TERM GOALS: Target date: 01/31/24 Updated 12/26/2023 Pt will improve PROM compared to initial administration Baseline:  Goal status: INITIAL: progress made  2.  Pt will successfully use a medication administration system for two weeks prior to 01/31/24 Baseline:  Goal status: INITIAL: MET  3.  Pt will make it on time to 100% of appointments in a 2-week period  after 01/03/24 using compensations Baseline:  Goal status: INITIAL: MET  4.  Pt will complete  a financial responsibility using a memory compensation system, between two sessions Baseline:  Goal status: INITIAL: progress made   ASSESSMENT:  CLINICAL IMPRESSION: Patient is a 78 y.o. M  who was seen today for cognitive communication deficit. Pt presents with a moderate primarily amnestic cognitive impairment. Pt and his wife report improved understanding of dementia and his wife reports decreasing moments of frustration.   Continued education provided on memory loss and dementia with recommendations provided for increased supervision. See the above treatment note for more details.   OBJECTIVE IMPAIRMENTS: include memory. These impairments are limiting patient from managing medications, managing appointments, managing finances, household responsibilities, ADLs/IADLs, and effectively communicating at home and in community. Factors affecting potential to achieve goals and functional outcome are ability to learn/carryover information.. Patient will benefit from skilled SLP services to address above impairments and improve overall function.  REHAB POTENTIAL: Good  PLAN:  SLP FREQUENCY: 1-2x/week  SLP DURATION: 8 weeks  PLANNED INTERVENTIONS: 92507 Treatment of speech (30 or 45 min) , Environmental controls, Cognitive reorganization, Internal/external aids, Functional tasks, Multimodal communication approach, SLP instruction and feedback, Compensatory strategies, and  Patient/family education   Suriya Kovarik B. Dreama Saa, M.S., CCC-SLP, Tree surgeon Certified Brain Injury Specialist [facility]  [facility] Rehabilitation Services Office [phone] Ascom [phone] Fax [phone]

## 2024-01-02 ENCOUNTER — Ambulatory Visit: Payer: PPO | Admitting: Speech Pathology

## 2024-01-02 ENCOUNTER — Ambulatory Visit: Payer: PPO | Admitting: Physical Therapy

## 2024-01-02 DIAGNOSIS — R41841 Cognitive communication deficit: Secondary | ICD-10-CM

## 2024-01-02 DIAGNOSIS — R2681 Unsteadiness on feet: Secondary | ICD-10-CM

## 2024-01-02 DIAGNOSIS — M6281 Muscle weakness (generalized): Secondary | ICD-10-CM

## 2024-01-02 DIAGNOSIS — R262 Difficulty in walking, not elsewhere classified: Secondary | ICD-10-CM

## 2024-01-02 DIAGNOSIS — R2689 Other abnormalities of gait and mobility: Secondary | ICD-10-CM

## 2024-01-02 NOTE — Therapy (Signed)
OUTPATIENT SPEECH LANGUAGE PATHOLOGY  TREATMENT NOTE   Patient Name: Eugene Martinez. MRN: [id number] DOB:Jan 18, 1946, 78 y.o., male Today's Date: 01/02/2024  PCP: Barbette Reichmann, MD REFERRING PROVIDER: Janice Coffin, PA-C    END OF SESSION:  End of Session - 01/02/24 1328     Visit Number 12    Number of Visits 13    Date for SLP Re-Evaluation 01/31/24    Authorization Type [employer]    Progress Note Due on Visit 20    SLP Start Time 1100    SLP Stop Time  1145    SLP Time Calculation (min) 45 min    Activity Tolerance Patient tolerated treatment well              Past Medical History:  Diagnosis Date   Allergic state    Amyloidosis (HCC)    Anemia    BPH (benign prostatic hyperplasia)    DDD (degenerative disc disease), lumbar    Dysplastic nevus 10/13/2013   Left posterior waistline paraspinal. Moderate to severe atypia, pattern borders on early evolving MIS, edge involved. Excised 03/31/2014, margins free.   Dysplastic nevus 12/19/2017   Right upper back medial. Mild atypia, lateral and deep margins involved.    Dysplastic nevus 11/28/2022   right prox lat thigh, Severe atypia, Excised 01/08/23   Dysrhythmia    Environmental and seasonal allergies    Gallop rhythm    GERD (gastroesophageal reflux disease)    Hypertension    Mild mitral regurgitation    a. by echo 10/2012   Pain    bil. 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 10/2012 Echo: EF 55-60%, severe LVH with speckling of myocardium -? amyloid. Mild MR, mod dil LA.   Past Surgical History:  Procedure Laterality Date   CIRCUMCISION  1984   COLONOSCOPY  03/2012   COLONOSCOPY WITH PROPOFOL N/A 10/29/2017   Procedure: COLONOSCOPY WITH PROPOFOL;  Surgeon: Christena Deem, MD;  Location: [facility] ENDOSCOPY;  Service: Endoscopy;  Laterality: N/A;   FOOT SURGERY     right foot   FOOT SURGERY Right 2017   LEG SURGERIES     HX.OF MULTIPLE SURGERIES FOR LEG LENGTH  DUE TO POLIO   LUMBAR LAMINECTOMY/DECOMPRESSION MICRODISCECTOMY N/A 01/26/2019   Procedure: LUMBAR LAMINECTOMY/DECOMPRESSION MICRODISCECTOMY 1 LEVEL L3-4;  Surgeon: Lucy Chris, MD;  Location: [facility] ORS;  Service: Neurosurgery;  Laterality: N/A;   NECK SURGERY     PILONIDAL CYST / SINUS EXCISION     Patient Active Problem List   Diagnosis Date Noted   Anemia 08/01/2020   Elevated hemoglobin A1c 08/01/2020   Loss of memory 08/01/2020   Sesamoiditis 06/25/2019   S/P lumbar laminectomy 01/26/2019   Weakness of both lower limbs 10/24/2018   LVH (left ventricular hypertrophy) 08/29/2016   Allergy 02/20/2016   Amyloidosis (HCC) 02/20/2016   Thrombocytopenia (HCC) 11/23/2015   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   Weight loss, unintentional 06/29/2013   Airway hyperreactivity 05/07/2013   Benign fibroma of prostate 05/07/2013   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 PMH of chemotherapy (2014), and polio (1954). Other PMH Primary hypertension  Elevated hemoglobin A1c  Thrombocytopenia (CMS-HCC)  Post-polio syndrome (CMS-HCC)  Other amyloidosis (CMS/HHS-HCC)  Mild intermittent asthma without complication (HHS-HCC)  Chronic diastolic heart failure (CMS/HHS-HCC  PAIN:  Are you having pain? No  FALLS: Has patient fallen in last 6 months?  See PT evaluation for details  LIVING ENVIRONMENT: Lives with: lives with their  spouse Lives in: House/apartment  PLOF:  Level of assistance: Comment: needs assistance with tasks that involve memory Employment: Retired  PATIENT GOALS: improve memory skills  SUBJECTIVE STATEMENT: "Yesterday he was really weak" Pt accompanied by: his wife  OBJECTIVE:  TODAY'S TREATMENT:                                                                                                                                         DATE:  Skilled treatment session focused on pt's cognitive communication goals. SLP facilitated session by providing the following interventions:  Continued skilled education provided to pt and his wife on fluctuating cognitive and physical abilities, ways to compensate for decrease in ability. His wife able to demonstrate teach back x 4. Pt also reports that he is engaging more with his memory book and bladder training has been successful in reducing leakage.   PATIENT EDUCATION: Education details: see "today's treatment" above Person educated: Patient and Spouse Education method: Explanation, Demonstration, and Handouts Education comprehension: verbalized understanding and needs further education   GOALS: Goals reviewed with patient? Yes  SHORT TERM GOALS: Target date: 01/03/24 Updated 12/26/2023 Pt will demo knowledge that he needs to use practical compensation in a situation which has been difficult in the past, x2 sessions Baseline: Goal status: INITIAL: MET  2.  Pt and/or wife will develop a medication administration system  Baseline:  Goal status: INITIAL: MET  3.  Pt will make it on time to 100% of appointments in a 2-week period prior to 01/03/24 using compensations Baseline:  Goal status: INITIAL: MET  4.  Pt will complete a financial task using a memory compensation system prior to 01/03/24 Baseline:  Goal status: INITIAL: MET   LONG TERM GOALS: Target date: 01/31/24 Updated 12/26/2023 Pt will improve PROM compared to initial  administration Baseline:  Goal status: INITIAL: progress made  2.  Pt will successfully use a medication administration system for two weeks prior to 01/31/24 Baseline:  Goal status: INITIAL: MET  3.  Pt will make it on time to 100% of appointments in a 2-week period  after 01/03/24 using compensations Baseline:  Goal status: INITIAL: MET  4.  Pt will complete a financial responsibility using a memory compensation system, between two sessions Baseline:  Goal status: INITIAL: progress made   ASSESSMENT:  CLINICAL IMPRESSION: Patient is a  78 y.o. M  who was seen today for cognitive communication deficit. Pt presents with a moderate primarily amnestic cognitive impairment. Pt and his wife report improved understanding of dementia and his wife reports decreasing moments of frustration.   Pt and his wife continue to receptive education and readily implement strategies to aide in increasing pt's functional independence.  See the above treatment note for more details.   OBJECTIVE IMPAIRMENTS: include memory. These impairments are limiting patient from managing medications, managing appointments, managing finances, household responsibilities, ADLs/IADLs, and effectively communicating at home and in community. Factors affecting potential to achieve goals and functional outcome are ability to learn/carryover information.. Patient will benefit from skilled SLP services to address above impairments and improve overall function.  REHAB POTENTIAL: Good  PLAN:  SLP FREQUENCY: 1-2x/week  SLP DURATION: 8 weeks  PLANNED INTERVENTIONS: 92507 Treatment of speech (30 or 45 min) , Environmental controls, Cognitive reorganization, Internal/external aids, Functional tasks, Multimodal communication approach, SLP instruction and feedback, Compensatory strategies, and Patient/family education   Leodis Alcocer B. Dreama Saa, M.S., CCC-SLP, Tree surgeon Certified Brain Injury Specialist [facility]   [facility] Rehabilitation Services Office [phone] Ascom [phone] Fax [phone]

## 2024-01-02 NOTE — Therapy (Signed)
OUTPATIENT PHYSICAL THERAPY TREATMENT   Patient Name: Eugene Martinez. MRN: [id number] DOB:[birth year], 78 y.o., male Today's Date: 01/02/2024   PCP: Eugene Reichmann, MD REFERRING PROVIDER: Janice Coffin, PA-C   END OF SESSION:  PT End of Session - 01/02/24 1151     Visit Number 11    Number of Visits 13    Date for PT Re-Evaluation 01/10/24    Authorization Type HTA    Progress Note Due on Visit 20    PT Start Time 1149    PT Stop Time 1228    PT Time Calculation (min) 39 min    Equipment Utilized During Treatment Gait belt    Activity Tolerance Patient tolerated treatment well;No increased pain    Behavior During Therapy WFL for tasks assessed/performed                 Past Medical History:  Diagnosis Date   Allergic state    Amyloidosis (HCC)    Anemia    BPH (benign prostatic hyperplasia)    DDD (degenerative disc disease), lumbar    Dysplastic nevus 10/13/2013   Left posterior waistline paraspinal. Moderate to severe atypia, pattern borders on early evolving MIS, edge involved. Excised 03/31/2014, margins free.   Dysplastic nevus 12/19/2017   Right upper back medial. Mild atypia, lateral and deep margins involved.    Dysplastic nevus 11/28/2022   right prox lat thigh, Severe atypia, Excised 01/08/23   Dysrhythmia    Environmental and seasonal allergies    Gallop rhythm    GERD (gastroesophageal reflux disease)    Hypertension    Mild mitral regurgitation    a. by echo 10/2012   Pain    bil. 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 10/2012 Echo: EF 55-60%, severe LVH with speckling of myocardium -? amyloid. Mild MR, mod dil LA.   Past Surgical History:  Procedure Laterality Date   CIRCUMCISION  1984   COLONOSCOPY  03/2012   COLONOSCOPY WITH PROPOFOL N/A 10/29/2017   Procedure: COLONOSCOPY WITH PROPOFOL;  Surgeon: Christena Deem, MD;  Location: [facility] ENDOSCOPY;  Service: Endoscopy;  Laterality: N/A;   FOOT SURGERY      right foot   FOOT SURGERY Right 2017   LEG SURGERIES     HX.OF MULTIPLE SURGERIES FOR LEG LENGTH DUE TO POLIO   LUMBAR LAMINECTOMY/DECOMPRESSION MICRODISCECTOMY N/A 01/26/2019   Procedure: LUMBAR LAMINECTOMY/DECOMPRESSION MICRODISCECTOMY 1 LEVEL L3-4;  Surgeon: Lucy Chris, MD;  Location: [facility] ORS;  Service: Neurosurgery;  Laterality: N/A;   NECK SURGERY     PILONIDAL CYST / SINUS EXCISION     Patient Active Problem List   Diagnosis Date Noted   Anemia 08/01/2020   Elevated hemoglobin A1c 08/01/2020   Loss of memory 08/01/2020   Sesamoiditis 06/25/2019   S/P lumbar laminectomy 01/26/2019   Weakness of both lower limbs 10/24/2018   LVH (left ventricular hypertrophy) 08/29/2016   Allergy 02/20/2016   Amyloidosis (HCC) 02/20/2016   Thrombocytopenia (HCC) 11/23/2015   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   Weight loss, unintentional 06/29/2013   Airway hyperreactivity 05/07/2013   Benign fibroma of prostate 05/07/2013   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   Chronic diastolic heart failure (HCC) 11/18/2012   Proteinuria, Bence Jones 11/18/2012   Multiple allergies 10/29/2012   Hypertension 10/29/2012   RBBB (right bundle branch block with left anterior fascicular block) 10/29/2012   Proteinuria 10/29/2012    ONSET DATE: 11/06/2023 (MD referral)  REFERRING DIAG: G31.84 (ICD-10-CM) - Mild cognitive impairment of uncertain or unknown etiology   THERAPY DIAG:  Unsteadiness on feet  Difficulty in walking, not elsewhere classified  Muscle weakness (generalized)  Other abnormalities of gait and mobility  Rationale for Evaluation and Treatment: Rehabilitation  SUBJECTIVE:                                                                                                                                                                                             SUBJECTIVE STATEMENT: Patient presents with wife. No falls since last session but was feeling weak on the day following the  last session again.   Pt accompanied by: significant other, [name]  PERTINENT HISTORY: Has been dx with MCI, fairly stable for the last 1-2 years.  PA suggested physical therapy.  Intermittent use of quad cane in the house. Pt has a new [employer] he purchased at end of December, has not used it much yet. Wife notices that R side gets weaker, this is the side that was affected by polio. 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 No  PRECAUTIONS: Fall; wears R AFO  RED FLAGS: None   WEIGHT BEARING RESTRICTIONS: No  FALLS: Has patient fallen in last 6 months? No *Wife reports fall 8 months ago and wife couldn't get him up*  LIVING ENVIRONMENT: Lives with: lives with their spouse Lives in: House/apartment Stairs:  1 step into home Has following equipment at home: Counselling psychologist and Environmental consultant - 2 wheeled *On Arts development officer for [employer] PLOF: Independent with household mobility with device and Independent with community mobility with device Enjoys yardwork, no longer does this  PATIENT GOALS: Pt's goals for therapy are to walk without falling.   OBJECTIVE:   TODAY'S TREATMENT:  DATE: 01/02/24  TODAY'S TREATMENT:                                                                                                                              DATE: 12/26/23              TA, working on walking mechanics for improved ambulatory safety and efficiency  Gait with QC 2 x 161ft with supervision assist. Min cues for improved step length .  Standing on airex pad.  Step training with cane. Step over cane with LLE and R quad cane x 10 then attempted 100 ft walking with this patter, small short steps and consistently  moving cane too far infront of BOS despite cues  Trial of Rollator x 170 ft, good form, min cues for proximity to walker.  Forward/reverse gait with rollator . 20 ftea with 2 x 180 degree turns with cues for proper turning     CGA-min assist from PT throughout. Pt does not respond well to practice or cues with gait with quad cane showing no carryover at end of session without cues.      PATIENT EDUCATION: Education details: Pt educated throughout session about proper posture and technique with exercises. Improved exercise technique, movement at target joints, use of target muscles after min to mod verbal, visual, tactile cues.   Person educated: Patient and Spouse Education method: Explanation and Demonstration Education comprehension: verbalized understanding and returned demonstration  HOME EXERCISE PROGRAM: Access Code: [email or web address] URL: [email or web address] Date: 11/25/2023 Prepared by: [facility] - Outpatient  Rehab - [facility]  Exercises - Single Leg Stance with Support  - 1 x daily - 5 x weekly - 1 sets - 3 reps - 10 sec hold - Standing Romberg to 1/2 Tandem Stance  - 1 x daily - 5 x weekly - 1 sets - 3 reps - 15 sec hold  GOALS: Goals reviewed with patient? Yes  SHORT TERM GOALS: Target date: 12/27/2023  Pt will be supervision with HEP for improved balance, gait. Baseline: given on 12/16 1/20: intermittent compliance Goal status: Ongoing  2.  Pt will improve TUG score to less than or equal to 15 sec for decreased fall risk. Baseline: 20.79 sec 1/20: 19 seconds no AD  Goal status: ongoing   3.  Pt will improve Berg score to at least 40/56 to decrease fall risk. Baseline: 35/56 1/20: 38/56 Goal status: Ongoing  4.  Pt/wife will verbalize understanding of fall prevention in home environment.  Baseline: 1/20: fall prevention steps taken Goal status:MET   LONG TERM GOALS: Target date: 01/10/2024  Pt will be supervision with progression of HEP for  improved balance, gait. Baseline: 1/20: wife reports intermittent compliance  Goal status: ongoing  2.  Pt will improve gait velocity to at least >1.0 m/s for improved gait efficiency and safety during 10 MWT Baseline: 1.56 ft/sec 1/20: 0.58 m/s  Goal status: partially Met  3.  Pt will improve  Berg score to at least 45/56 to decrease fall risk. Baseline: 35/56 1/20: 38/56  Goal status: Ongoing  4.  Pt will perform floor>stand transfer with min assist for safe fall recovery  Baseline:  Wife reports she was unable to get him up on his own with fall 8 months ago 1/20: requires assistance for getting up after a fall.   Goal status: ongoing   ASSESSMENT:  CLINICAL IMPRESSION:  Pt trial with rollator this date and showed improved sequencing and gait mechanics compared to cane usage. Pt and caregiver recommenced rollator due to increased ease of use and option for seated rest when fatigues. Pt patter with quad cane continues to be improper and cues are not helpful for carryover following gait training therapeutic activities.  Pt will continue to benefit from skilled physical therapy intervention to address impairments, improve QOL, and attain therapy goals.    OBJECTIVE IMPAIRMENTS: Abnormal gait, decreased balance, decreased mobility, difficulty walking, decreased strength, and postural dysfunction.   ACTIVITY LIMITATIONS: standing, squatting, transfers, and locomotion level  PARTICIPATION LIMITATIONS: community activity and church  PERSONAL FACTORS: 3+ comorbidities: see above PMH  are also affecting patient's functional outcome.   REHAB POTENTIAL: Good  CLINICAL DECISION MAKING: Evolving/moderate complexity  EVALUATION COMPLEXITY: Moderate  PLAN:  PT FREQUENCY: 1-2x/week  PT DURATION: 6 weeks plus eval visit  PLANNED INTERVENTIONS: 97110-Therapeutic exercises, 97530- Therapeutic activity, [id number]- Neuromuscular re-education, 97535- Self Care, 16109- Manual therapy, [phone]- Gait  training, Patient/Family education, Balance training, and DME instructions  PLAN FOR NEXT SESSION:   Dynamic balance and gait training. BLE strengthening.    11:51 AM, 01/02/24 Norman Herrlich PT ,DPT Physical Therapist- [facility]

## 2024-01-06 ENCOUNTER — Ambulatory Visit: Payer: PPO

## 2024-01-06 ENCOUNTER — Ambulatory Visit: Payer: PPO | Admitting: Speech Pathology

## 2024-01-06 DIAGNOSIS — R278 Other lack of coordination: Secondary | ICD-10-CM

## 2024-01-06 DIAGNOSIS — M6281 Muscle weakness (generalized): Secondary | ICD-10-CM

## 2024-01-06 DIAGNOSIS — R41841 Cognitive communication deficit: Secondary | ICD-10-CM | POA: Diagnosis not present

## 2024-01-06 DIAGNOSIS — R262 Difficulty in walking, not elsewhere classified: Secondary | ICD-10-CM

## 2024-01-06 DIAGNOSIS — R2681 Unsteadiness on feet: Secondary | ICD-10-CM

## 2024-01-06 NOTE — Therapy (Signed)
OUTPATIENT SPEECH LANGUAGE PATHOLOGY  TREATMENT NOTE   Patient Name: Eugene Martinez. MRN: [id number] DOB:Jun 10, 1946, 78 y.o., male Today's Date: 01/06/2024  PCP: Barbette Reichmann, MD REFERRING PROVIDER: Janice Coffin, PA-C    END OF SESSION:  End of Session - 01/06/24 1103     Visit Number 13    Number of Visits 29    Date for SLP Re-Evaluation 03/02/24    Authorization Type [employer]    Progress Note Due on Visit 20    SLP Start Time 1100    SLP Stop Time  1145    SLP Time Calculation (min) 45 min    Activity Tolerance Patient tolerated treatment well              Past Medical History:  Diagnosis Date   Allergic state    Amyloidosis (HCC)    Anemia    BPH (benign prostatic hyperplasia)    DDD (degenerative disc disease), lumbar    Dysplastic nevus 10/13/2013   Left posterior waistline paraspinal. Moderate to severe atypia, pattern borders on early evolving MIS, edge involved. Excised 03/31/2014, margins free.   Dysplastic nevus 12/19/2017   Right upper back medial. Mild atypia, lateral and deep margins involved.    Dysplastic nevus 11/28/2022   right prox lat thigh, Severe atypia, Excised 01/08/23   Dysrhythmia    Environmental and seasonal allergies    Gallop rhythm    GERD (gastroesophageal reflux disease)    Hypertension    Mild mitral regurgitation    a. by echo 10/2012   Pain    bil. 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 10/2012 Echo: EF 55-60%, severe LVH with speckling of myocardium -? amyloid. Mild MR, mod dil LA.   Past Surgical History:  Procedure Laterality Date   CIRCUMCISION  1984   COLONOSCOPY  03/2012   COLONOSCOPY WITH PROPOFOL N/A 10/29/2017   Procedure: COLONOSCOPY WITH PROPOFOL;  Surgeon: Christena Deem, MD;  Location: [facility] END[facility]COPY;  Service: Endoscopy;  Laterality: N/A;   FOOT SURGERY     right foot   FOOT SURGERY Right 2017   LEG SURGERIES     HX.OF MULTIPLE SURGERIES FOR LEG LENGTH  DUE TO POLIO   LUMBAR LAMINECTOMY/DECOMPRESSION MICRODISCECTOMY N/A 01/26/2019   Procedure: LUMBAR LAMINECTOMY/DECOMPRESSION MICRODISCECTOMY 1 LEVEL L3-4;  Surgeon: Lucy Chris, MD;  Location: [facility] ORS;  Service: Neurosurgery;  Laterality: N/A;   NECK SURGERY     PILONIDAL CYST / SINUS EXCISION     Patient Active Problem List   Diagnosis Date Noted   Anemia 08/01/2020   Elevated hemoglobin A1c 08/01/2020   Loss of memory 08/01/2020   Sesamoiditis 06/25/2019   S/P lumbar laminectomy 01/26/2019   Weakness of both lower limbs 10/24/2018   LVH (left ventricular hypertrophy) 08/29/2016   Allergy 02/20/2016   Amyloidosis (HCC) 02/20/2016   Thrombocytopenia (HCC) 11/23/2015   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   Weight loss, unintentional 06/29/2013   Airway hyperreactivity 05/07/2013   Benign fibroma of prostate 05/07/2013   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 PMH of chemotherapy (2014), and polio (1954). Other PMH Primary hypertension  Elevated hemoglobin A1c  Thrombocytopenia (CMS-HCC)  Post-polio syndrome (CMS-HCC)  Other amyloidosis (CMS/HHS-HCC)  Mild intermittent asthma without complication (HHS-HCC)  Chronic diastolic heart failure (CMS/HHS-HCC  PAIN:  Are you having pain? No  FALLS: Has patient fallen in last 6 months?  See PT evaluation for details  LIVING ENVIRONMENT: Lives with: lives with their  spouse Lives in: House/apartment  PLOF:  Level of assistance: Comment: needs assistance with tasks that involve memory Employment: Retired  PATIENT GOALS: improve memory skills  SUBJECTIVE STATEMENT: "It was not a good weekend" reported by his wife, pt unable to recall Pt accompanied by: his wife  OBJECTIVE:  TODAY'S TREATMENT:                                                                                                                                         DATE:  Skilled treatment session focused on pt's cognitive communication goals. SLP facilitated session by providing the following interventions:  Pt's wife reports pt's "memory is shorter and shorter" increased difficulty notes putting on (sequencing his depends and underwear, reports that he put his memory book in the closet as he is not able to recall need to use it, not always wearing his hearing aids so it is difficult for him to hear her. With SLP questions, pt doesn't have recall of need to wear hearing aids or use memory book and he also reports that he didn't put his hearing aids back in when cued by his wife.  Education provided on need to recruit help within the home such as their daughters and written information provide don caregiver support groups for dementia.   PATIENT EDUCATION: Education details: see "today's treatment" above Person educated: Patient and Spouse Education method: Explanation, Demonstration, and Handouts Education comprehension: verbalized understanding and needs further education   GOALS: Goals reviewed with patient? Yes  SHORT TERM GOALS: Target date: 10 sessions Updated 12/26/2023 Updated 01/06/2024 Pt will demo knowledge that he needs to use practical compensation in a situation which has been difficult in the past, x2 sessions Baseline: Goal status: INITIAL: MET  2.  Pt and/or wife will develop a medication administration system  Baseline:  Goal status: INITIAL: MET  3.  Pt will  make it on time to 100% of appointments in a 2-week period prior to 01/03/24 using compensations Baseline:  Goal status: INITIAL: MET  4.  Pt will complete a financial task using a memory compensation system prior to 01/03/24 Baseline:  Goal status: INITIAL: MET   LONG TERM GOALS: Target date: 03/02/2024 Updated 12/26/2023 Updated 01/06/2024 Pt will improve PROM compared to initial administration Baseline:  Goal status: INITIAL: progress made: progress made  2.  Pt will  successfully use a medication administration system for two weeks prior to 01/31/24 Baseline:  Goal status: INITIAL: MET  3.  Pt will make it on time to 100% of appointments in a 2-week period  after 01/03/24 using compensations Baseline:  Goal status: INITIAL: MET  4.  Pt will complete a financial responsibility using a memory compensation system, between two sessions Baseline:  Goal status: INITIAL: progress made: MET   ASSESSMENT:  CLINICAL IMPRESSION: Patient is a 78 y.o. M  who was seen today for cognitive communication deficit. Pt presents with a moderate primarily amnestic cognitive impairment. Pt and his wife report improved understanding of dementia and his wife reports decreasing moments of frustration.   Re-certification for services requested to complete education on memory loss, safety, ways to improve functional independence and reduce caregiver burden.   See the above treatment note for more details.   OBJECTIVE IMPAIRMENTS: include memory. These impairments are limiting patient from managing medications, managing appointments, managing finances, household responsibilities, ADLs/IADLs, and effectively communicating at home and in community. Factors affecting potential to achieve goals and functional outcome are ability to learn/carryover information.. Patient will benefit from skilled SLP services to address above impairments and improve overall function.  REHAB POTENTIAL: Good  PLAN:  SLP  FREQUENCY: 1-2x/week  SLP DURATION: 8 weeks  PLANNED INTERVENTIONS: 92507 Treatment of speech (30 or 45 min) , Environmental controls, Cognitive reorganization, Internal/external aids, Functional tasks, Multimodal communication approach, SLP instruction and feedback, Compensatory strategies, and Patient/family education   Dawood Spitler B. Dreama Saa, M.S., CCC-SLP, Tree surgeon Certified Brain Injury Specialist [facility]  [facility] Office [phone] Ascom [phone] Fax [phone]

## 2024-01-06 NOTE — Therapy (Signed)
OUTPATIENT PHYSICAL THERAPY TREATMENT   Patient Name: Eugene Martinez. MRN: [id number] DOB:[birth year], 78 y.o., male Today's Date: 01/06/2024   PCP: Barbette Reichmann, MD REFERRING PROVIDER: Janice Coffin, PA-C   END OF SESSION:  PT End of Session - 01/06/24 1033     Visit Number 12    Number of Visits 13    Date for PT Re-Evaluation 01/10/24    Authorization Type HTA    Progress Note Due on Visit 20    PT Start Time 1033    PT Stop Time 1059    PT Time Calculation (min) 26 min    Equipment Utilized During Treatment Gait belt    Activity Tolerance Patient tolerated treatment well;No increased pain    Behavior During Therapy WFL for tasks assessed/performed                  Past Medical History:  Diagnosis Date   Allergic state    Amyloidosis (HCC)    Anemia    BPH (benign prostatic hyperplasia)    DDD (degenerative disc disease), lumbar    Dysplastic nevus 10/13/2013   Left posterior waistline paraspinal. Moderate to severe atypia, pattern borders on early evolving MIS, edge involved. Excised 03/31/2014, margins free.   Dysplastic nevus 12/19/2017   Right upper back medial. Mild atypia, lateral and deep margins involved.    Dysplastic nevus 11/28/2022   right prox lat thigh, Severe atypia, Excised 01/08/23   Dysrhythmia    Environmental and seasonal allergies    Gallop rhythm    GERD (gastroesophageal reflux disease)    Hypertension    Mild mitral regurgitation    a. by echo 10/2012   Pain    bil. 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 10/2012 Echo: EF 55-60%, severe LVH with speckling of myocardium -? amyloid. Mild MR, mod dil LA.   Past Surgical History:  Procedure Laterality Date   CIRCUMCISION  1984   COLONOSCOPY  03/2012   COLONOSCOPY WITH PROPOFOL N/A 10/29/2017   Procedure: COLONOSCOPY WITH PROPOFOL;  Surgeon: Christena Deem, MD;  Location: [facility] ENDOSCOPY;  Service: Endoscopy;  Laterality: N/A;   FOOT SURGERY      right foot   FOOT SURGERY Right 2017   LEG SURGERIES     HX.OF MULTIPLE SURGERIES FOR LEG LENGTH DUE TO POLIO   LUMBAR LAMINECTOMY/DECOMPRESSION MICRODISCECTOMY N/A 01/26/2019   Procedure: LUMBAR LAMINECTOMY/DECOMPRESSION MICRODISCECTOMY 1 LEVEL L3-4;  Surgeon: Lucy Chris, MD;  Location: [facility] ORS;  Service: Neurosurgery;  Laterality: N/A;   NECK SURGERY     PILONIDAL CYST / SINUS EXCISION     Patient Active Problem List   Diagnosis Date Noted   Anemia 08/01/2020   Elevated hemoglobin A1c 08/01/2020   Loss of memory 08/01/2020   Sesamoiditis 06/25/2019   S/P lumbar laminectomy 01/26/2019   Weakness of both lower limbs 10/24/2018   LVH (left ventricular hypertrophy) 08/29/2016   Allergy 02/20/2016   Amyloidosis (HCC) 02/20/2016   Thrombocytopenia (HCC) 11/23/2015   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   Weight loss, unintentional 06/29/2013   Airway hyperreactivity 05/07/2013   Benign fibroma of prostate 05/07/2013   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   Chronic diastolic heart failure (HCC) 11/18/2012   Proteinuria, Bence Jones 11/18/2012   Multiple allergies 10/29/2012   Hypertension 10/29/2012   RBBB (right bundle branch block with left anterior fascicular block) 10/29/2012   Proteinuria 10/29/2012    ONSET DATE: 11/06/2023 (MD  referral)  REFERRING DIAG: G31.84 (ICD-10-CM) - Mild cognitive impairment of uncertain or unknown etiology   THERAPY DIAG:  Difficulty in walking, not elsewhere classified  Muscle weakness (generalized)  Unsteadiness on feet  Other lack of coordination  Rationale for Evaluation and Treatment: Rehabilitation  SUBJECTIVE:                                                                                                                                                                                             SUBJECTIVE STATEMENT: Pt presents at wrong appt time, seen as soon as possible in this available slot. Pt reports no recent falls, no  aches/pains. Still using his QC, feels he might  fall sometimes.  Pt spouse says they are getting rollator today.  Pt accompanied by: significant other, [name]  PERTINENT HISTORY: Has been dx with MCI, fairly stable for the last 1-2 years.  PA suggested physical therapy.  Intermittent use of quad cane in the house. Pt has a new [employer] he purchased at end of December, has not used it much yet. Wife notices that R side gets weaker, this is the side that was affected by polio. 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 No  PRECAUTIONS: Fall; wears R AFO  RED FLAGS: None   WEIGHT BEARING RESTRICTIONS: No  FALLS: Has patient fallen in last 6 months? No *Wife reports fall 8 months ago and wife couldn't get him up*  LIVING ENVIRONMENT: Lives with: lives with their spouse Lives in: House/apartment Stairs:  1 step into home Has following equipment at home: Counselling psychologist and Environmental consultant - 2 wheeled *On Arts development officer for [employer] at [employer] PLOF: Independent with household mobility with device and Independent with community mobility with device Enjoys yardwork, no longer does this  PATIENT GOALS: Pt's goals for therapy are to walk without falling.   OBJECTIVE:   TODAY'S TREATMENT:  DATE: 01/06/24  TA: Practice with Rollator 2 x 148 cuing for proximity to AD, cuing for increased LLE step-length, able to intermittently correct. Pt states, "I feel so much steadier."   Forward/reverse gait with rollator 4x20 ft  Obstacle negotiation with rollator x 2 rounds around 5 cones - knocks into cone 1x, almost his cones 4x  Reviewed importance of HEP   TE:   STS: 2x6 hands-free. Rates easy    PATIENT  EDUCATION: Education details: Pt educated throughout session about proper posture and technique with exercises. Improved exercise technique, movement at target joints, use of target muscles after min to mod verbal, visual, tactile cues.   Person educated: Patient and Spouse Education method: Explanation and Demonstration Education comprehension: verbalized understanding and returned demonstration  HOME EXERCISE PROGRAM: Access Code: [email or web address] URL: [email or web address] Date: 11/25/2023 Prepared by: [facility] - Outpatient  Rehab - [facility]  Exercises - Single Leg Stance with Support  - 1 x daily - 5 x weekly - 1 sets - 3 reps - 10 sec hold - Standing Romberg to 1/2 Tandem Stance  - 1 x daily - 5 x weekly - 1 sets - 3 reps - 15 sec hold  GOALS: Goals reviewed with patient? Yes  SHORT TERM GOALS: Target date: 12/27/2023  Pt will be supervision with HEP for improved balance, gait. Baseline: given on 12/16 1/20: intermittent compliance Goal status: Ongoing  2.  Pt will improve TUG score to less than or equal to 15 sec for decreased fall risk. Baseline: 20.79 sec 1/20: 19 seconds no AD  Goal status: ongoing   3.  Pt will improve Berg score to at least 40/56 to decrease fall risk. Baseline: 35/56 1/20: 38/56 Goal status: Ongoing  4.  Pt/wife will verbalize understanding of fall prevention in home environment.  Baseline: 1/20: fall prevention steps taken Goal status:MET   LONG TERM GOALS: Target date: 01/10/2024  Pt will be supervision with progression of HEP for improved balance, gait. Baseline: 1/20: wife reports intermittent compliance  Goal status: ongoing  2.  Pt will improve gait velocity to at least >1.0 m/s for improved gait efficiency and safety during 10 MWT Baseline: 1.56 ft/sec 1/20: 0.58 m/s  Goal status: partially Met  3.  Pt will improve Berg score to at least 45/56 to decrease fall risk. Baseline: 35/56 1/20: 38/56  Goal status:  Ongoing  4.  Pt will perform floor>stand transfer with min assist for safe fall recovery  Baseline:  Wife reports she was unable to get him up on his own with fall 8 months ago 1/20: requires assistance for getting up after a fall.   Goal status: ongoing   ASSESSMENT:  CLINICAL IMPRESSION:  Pt arrives wrong appt time, session limited as a result. Continued review of correct and safe use of rollator. Pt notes improved sense of steadiness in session. He'd benefit from further obstacle negotiation practice. Pt will continue to benefit from skilled physical therapy intervention to address impairments, improve QOL, and attain therapy goals.    OBJECTIVE IMPAIRMENTS: Abnormal gait, decreased balance, decreased mobility, difficulty walking, decreased strength, and postural dysfunction.   ACTIVITY LIMITATIONS: standing, squatting, transfers, and locomotion level  PARTICIPATION LIMITATIONS: community activity and church  PERSONAL FACTORS: 3+ comorbidities: see above PMH  are also affecting patient's functional outcome.   REHAB POTENTIAL: Good  CLINICAL DECISION MAKING: Evolving/moderate complexity  EVALUATION COMPLEXITY: Moderate  PLAN:  PT FREQUENCY: 1-2x/week  PT DURATION: 6 weeks plus eval visit  PLANNED INTERVENTIONS: 97110-Therapeutic  exercises, 97530- Therapeutic activity, [id number]- Neuromuscular re-education, [phone]- Self Care, [id number]- Manual therapy, [phone]- Gait training, Patient/Family education, Balance training, and DME instructions  PLAN FOR NEXT SESSION:   Dynamic balance and gait training. BLE strengthening.    11:09 AM, 01/06/24 Baird Kay PT ,DPT Physical Therapist- [place]  [facility]

## 2024-01-09 ENCOUNTER — Ambulatory Visit: Payer: PPO | Admitting: Physical Therapy

## 2024-01-09 ENCOUNTER — Ambulatory Visit: Payer: PPO | Admitting: Speech Pathology

## 2024-01-09 DIAGNOSIS — R278 Other lack of coordination: Secondary | ICD-10-CM

## 2024-01-09 DIAGNOSIS — R262 Difficulty in walking, not elsewhere classified: Secondary | ICD-10-CM

## 2024-01-09 DIAGNOSIS — R41841 Cognitive communication deficit: Secondary | ICD-10-CM

## 2024-01-09 DIAGNOSIS — M6281 Muscle weakness (generalized): Secondary | ICD-10-CM

## 2024-01-09 DIAGNOSIS — R2689 Other abnormalities of gait and mobility: Secondary | ICD-10-CM

## 2024-01-09 DIAGNOSIS — R2681 Unsteadiness on feet: Secondary | ICD-10-CM

## 2024-01-09 NOTE — Therapy (Signed)
OUTPATIENT PHYSICAL THERAPY TREATMENT/D/C summary    Patient Name: Eugene Martinez. MRN: [id number] DOB:23-Feb-1946, 78 y.o., male Today's Date: 01/09/2024   PCP: Barbette Reichmann, MD REFERRING PROVIDER: Janice Coffin, PA-C   END OF SESSION:  PT End of Session - 01/09/24 1535     Visit Number 13    Number of Visits 13    Date for PT Re-Evaluation 01/10/24    Authorization Type HTA    Progress Note Due on Visit 20    PT Start Time 1532    PT Stop Time 1615    PT Time Calculation (min) 43 min    Equipment Utilized During Treatment Gait belt    Activity Tolerance Patient tolerated treatment well;No increased pain    Behavior During Therapy WFL for tasks assessed/performed                  Past Medical History:  Diagnosis Date   Allergic state    Amyloidosis (HCC)    Anemia    BPH (benign prostatic hyperplasia)    DDD (degenerative disc disease), lumbar    Dysplastic nevus 10/13/2013   Left posterior waistline paraspinal. Moderate to severe atypia, pattern borders on early evolving MIS, edge involved. Excised 03/31/2014, margins free.   Dysplastic nevus 12/19/2017   Right upper back medial. Mild atypia, lateral and deep margins involved.    Dysplastic nevus 11/28/2022   right prox lat thigh, Severe atypia, Excised 01/08/23   Dysrhythmia    Environmental and seasonal allergies    Gallop rhythm    GERD (gastroesophageal reflux disease)    Hypertension    Mild mitral regurgitation    a. by echo 10/2012   Pain    bil. 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 10/2012 Echo: EF 55-60%, severe LVH with speckling of myocardium -? amyloid. Mild MR, mod dil LA.   Past Surgical History:  Procedure Laterality Date   CIRCUMCISION  1984   COLONOSCOPY  03/2012   COLONOSCOPY WITH PROPOFOL N/A 10/29/2017   Procedure: COLONOSCOPY WITH PROPOFOL;  Surgeon: Christena Deem, MD;  Location: [facility] ENDOSCOPY;  Service: Endoscopy;  Laterality: N/A;   FOOT  SURGERY     right foot   FOOT SURGERY Right 2017   LEG SURGERIES     HX.OF MULTIPLE SURGERIES FOR LEG LENGTH DUE TO POLIO   LUMBAR LAMINECTOMY/DECOMPRESSION MICRODISCECTOMY N/A 01/26/2019   Procedure: LUMBAR LAMINECTOMY/DECOMPRESSION MICRODISCECTOMY 1 LEVEL L3-4;  Surgeon: Lucy Chris, MD;  Location: [facility] ORS;  Service: Neurosurgery;  Laterality: N/A;   NECK SURGERY     PILONIDAL CYST / SINUS EXCISION     Patient Active Problem List   Diagnosis Date Noted   Anemia 08/01/2020   Elevated hemoglobin A1c 08/01/2020   Loss of memory 08/01/2020   Sesamoiditis 06/25/2019   S/P lumbar laminectomy 01/26/2019   Weakness of both lower limbs 10/24/2018   LVH (left ventricular hypertrophy) 08/29/2016   Allergy 02/20/2016   Amyloidosis (HCC) 02/20/2016   Thrombocytopenia (HCC) 11/23/2015   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   Weight loss, unintentional 06/29/2013   Airway hyperreactivity 05/07/2013   Benign fibroma of prostate 05/07/2013   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   Chronic diastolic heart failure (HCC) 11/18/2012   Proteinuria, Bence Jones 11/18/2012   Multiple allergies 10/29/2012   Hypertension 10/29/2012   RBBB (right bundle branch block with left anterior fascicular block) 10/29/2012   Proteinuria 10/29/2012    ONSET DATE:  11/06/2023 (MD referral)  REFERRING DIAG: G31.84 (ICD-10-CM) - Mild 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 Noted to have new rollator.  Questions about rollator brake management. States that they believe additional PT is not required as pt has been moving better with new rollator and he believes that he will be more consistent with HEP.   Pt accompanied by: significant other, [name]  PERTINENT HISTORY: Has been dx with MCI, fairly stable for the last 1-2 years.  PA suggested physical therapy.  Intermittent use of quad cane in the house. Pt has a new [employer] he purchased at end of December, has not used it much yet. Wife notices that R side gets weaker, this is the side that was affected by polio. 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 No  PRECAUTIONS: Fall; wears R AFO  RED FLAGS: None   WEIGHT BEARING RESTRICTIONS: No  FALLS: Has patient fallen in last 6 months? No *Wife reports fall 8 months ago and wife couldn't get him up*  LIVING ENVIRONMENT: Lives with: lives with their spouse Lives in: House/apartment Stairs:  1 step into home Has following equipment at home: Counselling psychologist and Environmental consultant - 2 wheeled *On Arts development officer for [employer] at [employer] PLOF: Independent with household mobility with device and Independent with community mobility with device Enjoys yardwork, no longer does this  PATIENT GOALS: Pt's goals for therapy are to walk without falling.   OBJECTIVE:   See goal assessment.   TODAY'S TREATMENT:  DATE: 01/09/24  Gait with rollator x 124ft, improved gait speed and step length, but still noted to drag the RLE.  Additional gait without AD x 91ft with CGA, noted to have decreased step length, increased shuffle and significantly reduced speed.   Goal assessment completed for d/c. See below for details    PATIENT  EDUCATION: Education details: Pt educated throughout session about proper posture and technique with exercises. Improved exercise technique, movement at target joints, use of target muscles after min to mod verbal, visual, tactile cues.   Person educated: Patient and Spouse Education method: Explanation and Demonstration Education comprehension: verbalized understanding and returned demonstration  HOME EXERCISE PROGRAM: Access Code: [email or web address] URL: [email or web address] Date: 11/25/2023 Prepared by: [facility] - Outpatient  Rehab - [facility]  Exercises - Single Leg Stance with Support  - 1 x daily - 5 x weekly - 1 sets - 3 reps - 10 sec hold - Standing Romberg to 1/2 Tandem Stance  - 1 x daily - 5 x weekly - 1 sets - 3 reps - 15 sec hold  GOALS: Goals reviewed with patient? Yes  SHORT TERM GOALS: Target date: 12/27/2023  Pt will be supervision with HEP for improved balance, gait. Baseline: given on 12/16 1/20: intermittent compliance 1/30: improved compliance per wife  Goal status: MET  2.  Pt will improve TUG score to less than or equal to 15 sec for decreased fall risk. Baseline: 20.79 sec 1/20: 19 seconds no AD  1/30 14.93 with rollator; 17.6sec  Goal status:  MET with rollator   3.  Pt will improve Berg score to at least 40/56 to decrease fall risk. Baseline: 35/56 1/20: 38/56 1/30: 41/56 Goal status: MET  4.  Pt/wife will verbalize understanding of fall prevention in home environment.  Baseline: 1/20: fall prevention steps taken Goal status: MET   LONG TERM GOALS: Target date: 01/10/2024  Pt will be supervision with progression of HEP for improved balance, gait. Baseline: 1/20: wife reports intermittent compliance  1/30: wife reports improved compliance Goal status: MET   2.  Pt will improve gait velocity to at least >1.0 m/s for improved gait efficiency and safety during 10 MWT Baseline: 1.56 ft/sec 1/20: 0.58 m/s  1/30: 0.644m/s with rollator   Goal status: Ongoing   3.  Pt will improve Berg score to at least 45/56 to decrease fall risk. Baseline: 35/56 1/20: 38/56  1/30: 41/56 Goal status: Ongoing  4.  Pt will perform floor>stand transfer with min assist for safe fall recovery  Baseline:  Wife reports she was unable to get him up on his own with fall 8 months ago 1/20: requires assistance for getting up after a fall.   1/30: able to recover from Qped with CGA and min-mod cues for technique. .   Goal status: MET    ASSESSMENT:  CLINICAL IMPRESSION:  PT instructed pt in re-assessment of goals at end of current certification. Pt and wife report feeling that with HEP and new rollator additional PT will not be required, so they are comfortable to d/c today. Pt demonstrates mild improvement in balance with increased Berg and TUG with significant improvement in TUG with rollator. Pt noted to be able to complete  floor Qped to standing with CGA for safety and only min-mod cues for technique to return to standing from half kneeling. Due to improved balance and pt's family's belief that additional PT will not be required at this time, will d/c from PT services.  OBJECTIVE IMPAIRMENTS: Abnormal gait, decreased balance, decreased mobility, difficulty walking, decreased strength, and postural dysfunction.   ACTIVITY LIMITATIONS: standing, squatting, transfers, and locomotion level  PARTICIPATION LIMITATIONS: community activity and church  PERSONAL FACTORS: 3+ comorbidities: see above PMH  are also affecting patient's functional outcome.   REHAB POTENTIAL: Good  CLINICAL DECISION MAKING: Evolving/moderate complexity  EVALUATION COMPLEXITY: Moderate  PLAN:  PT FREQUENCY: 1-2x/week  PT DURATION: 6 weeks plus eval visit  PLANNED INTERVENTIONS: 97110-Therapeutic exercises, 97530- Therapeutic activity, 97112- Neuromuscular re-education, 97535- Self Care, 16109- Manual therapy, [phone]- Gait training, Patient/Family education,  Balance training, and DME instructions  PLAN FOR NEXT SESSION:  N/a    3:47 PM, 01/09/24 Golden Pop PT ,DPT Physical Therapist- [place]  [facility]

## 2024-01-09 NOTE — Therapy (Signed)
OUTPATIENT SPEECH LANGUAGE PATHOLOGY  TREATMENT NOTE   Patient Name: Eugene Martinez. MRN: [id number] DOB:[birth year], 78 y.o., male Today's Date: 01/09/2024  PCP: Barbette Reichmann, MD REFERRING PROVIDER: Janice Coffin, PA-C    END OF SESSION:  End of Session - 01/09/24 1451     Visit Number 14    Number of Visits 29    Date for SLP Re-Evaluation 03/02/24    Authorization Type [employer]    Progress Note Due on Visit 20    SLP Start Time 1445    SLP Stop Time  1530    SLP Time Calculation (min) 45 min    Activity Tolerance Patient tolerated treatment well              Past Medical History:  Diagnosis Date   Allergic state    Amyloidosis (HCC)    Anemia    BPH (benign prostatic hyperplasia)    DDD (degenerative disc disease), lumbar    Dysplastic nevus 10/13/2013   Left posterior waistline paraspinal. Moderate to severe atypia, pattern borders on early evolving MIS, edge involved. Excised 03/31/2014, margins free.   Dysplastic nevus 12/19/2017   Right upper back medial. Mild atypia, lateral and deep margins involved.    Dysplastic nevus 11/28/2022   right prox lat thigh, Severe atypia, Excised 01/08/23   Dysrhythmia    Environmental and seasonal allergies    Gallop rhythm    GERD (gastroesophageal reflux disease)    Hypertension    Mild mitral regurgitation    a. by echo 10/2012   Pain    bil. 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 10/2012 Echo: EF 55-60%, severe LVH with speckling of myocardium -? amyloid. Mild MR, mod dil LA.   Past Surgical History:  Procedure Laterality Date   CIRCUMCISION  1984   COLONOSCOPY  03/2012   COLONOSCOPY WITH PROPOFOL N/A 10/29/2017   Procedure: COLONOSCOPY WITH PROPOFOL;  Surgeon: Christena Deem, MD;  Location: [facility] ENDOSCOPY;  Service: Endoscopy;  Laterality: N/A;   FOOT SURGERY     right foot   FOOT SURGERY Right 2017   LEG SURGERIES     HX.OF MULTIPLE SURGERIES FOR LEG LENGTH  DUE TO POLIO   LUMBAR LAMINECTOMY/DECOMPRESSION MICRODISCECTOMY N/A 01/26/2019   Procedure: LUMBAR LAMINECTOMY/DECOMPRESSION MICRODISCECTOMY 1 LEVEL L3-4;  Surgeon: Lucy Chris, MD;  Location: [facility] ORS;  Service: Neurosurgery;  Laterality: N/A;   NECK SURGERY     PILONIDAL CYST / SINUS EXCISION     Patient Active Problem List   Diagnosis Date Noted   Anemia 08/01/2020   Elevated hemoglobin A1c 08/01/2020   Loss of memory 08/01/2020   Sesamoiditis 06/25/2019   S/P lumbar laminectomy 01/26/2019   Weakness of both lower limbs 10/24/2018   LVH (left ventricular hypertrophy) 08/29/2016   Allergy 02/20/2016   Amyloidosis (HCC) 02/20/2016   Thrombocytopenia (HCC) 11/23/2015   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   Weight loss, unintentional 06/29/2013   Airway hyperreactivity 05/07/2013   Benign fibroma of prostate 05/07/2013   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 PMH of chemotherapy (2014), and polio (1954). Other PMH Primary hypertension  Elevated hemoglobin A1c  Thrombocytopenia (CMS-HCC)  Post-polio syndrome (CMS-HCC)  Other amyloidosis (CMS/HHS-HCC)  Mild intermittent asthma without complication (HHS-HCC)  Chronic diastolic heart failure (CMS/HHS-HCC  PAIN:  Are you having pain? No  FALLS: Has patient fallen in last 6 months?  See PT evaluation for details  LIVING ENVIRONMENT: Lives with: lives with their  spouse Lives in: House/apartment  PLOF:  Level of assistance: Comment: needs assistance with tasks that involve memory Employment: Retired  PATIENT GOALS: improve memory skills  SUBJECTIVE STATEMENT: Pt's wife brought in some information from previously discussed Teepa Snow Environmental education officer) on dementia Pt accompanied by: his wife  OBJECTIVE:  TODAY'S TREATMENT:                                                                                                                                         DATE:  Skilled treatment session focused on pt's cognitive communication goals. SLP facilitated session by providing the following interventions:  Pt and his wife report that it had been a "good week" - pt's wife reports increased conversation, mobility with new rolling walker, continued eagerness to perform household tasks.   PATIENT EDUCATION: Education details: see "today's treatment" above Person educated: Patient and Spouse Education method: Explanation, Demonstration, and Handouts Education comprehension: verbalized understanding and needs further education   GOALS: Goals reviewed with patient? Yes  SHORT TERM GOALS: Target date: 10 sessions Updated 12/26/2023 Updated 01/06/2024 Pt will demo knowledge that he needs to use practical compensation in a situation which has been difficult in the past, x2 sessions Baseline: Goal status: INITIAL: MET  2.  Pt and/or wife will develop a medication administration system  Baseline:  Goal status: INITIAL: MET  3.  Pt will make it on time to 100% of appointments in a 2-week period prior to 01/03/24 using compensations Baseline:  Goal status: INITIAL: MET  4.  Pt will complete a financial task using a memory compensation system prior to 01/03/24 Baseline:  Goal status: INITIAL: MET   LONG TERM GOALS: Target date: 03/02/2024 Updated 12/26/2023 Updated 01/06/2024 Pt will improve PROM compared to initial administration Baseline:  Goal status:  INITIAL: progress made: progress made  2.  Pt will successfully use a medication administration system for two weeks prior to 01/31/24 Baseline:  Goal status: INITIAL: MET  3.  Pt will make it on time to 100% of appointments in a 2-week period  after 01/03/24 using compensations Baseline:  Goal status: INITIAL: MET  4.  Pt will complete a financial responsibility using a memory compensation system, between two sessions Baseline:  Goal status: INITIAL: progress made: MET   ASSESSMENT:  CLINICAL IMPRESSION: Patient is a 78 y.o. M  who was  seen today for cognitive communication deficit. Pt presents with a moderate primarily amnestic cognitive impairment. Pt and his wife report improved understanding of dementia and his wife reports decreasing moments of frustration.    See the above treatment note for more details.   OBJECTIVE IMPAIRMENTS: include memory. These impairments are limiting patient from managing medications, managing appointments, managing finances, household responsibilities, ADLs/IADLs, and effectively communicating at home and in community. Factors affecting potential to achieve goals and functional outcome are ability to learn/carryover information.. Patient will benefit from skilled SLP services to address above impairments and improve overall function.  REHAB POTENTIAL: Good  PLAN:  SLP FREQUENCY: 1-2x/week  SLP DURATION: 8 weeks  PLANNED INTERVENTIONS: 92507 Treatment of speech (30 or 45 min) , Environmental controls, Cognitive reorganization, Internal/external aids, Functional tasks, Multimodal communication approach, SLP instruction and feedback, Compensatory strategies, and Patient/family education   Colsen Modi B. Dreama Saa, M.S., CCC-SLP, Tree surgeon Certified Brain Injury Specialist [facility]  [facility] Rehabilitation Services Office [phone] Ascom [phone] Fax [phone]

## 2024-01-13 ENCOUNTER — Ambulatory Visit: Payer: PPO | Attending: Student | Admitting: Speech Pathology

## 2024-01-13 ENCOUNTER — Ambulatory Visit: Payer: PPO

## 2024-01-13 DIAGNOSIS — R41841 Cognitive communication deficit: Secondary | ICD-10-CM | POA: Insufficient documentation

## 2024-01-13 NOTE — Therapy (Signed)
OUTPATIENT SPEECH LANGUAGE PATHOLOGY  TREATMENT NOTE DISCHARGE SUMMARY   Patient Name: Eugene Martinez. MRN: [id number] DOB:[birth year], 78 y.o., male Today's Date: 01/13/2024  PCP: Barbette Reichmann, MD REFERRING PROVIDER: Janice Coffin, PA-C    END OF SESSION:  End of Session - 01/13/24 1540     Visit Number 15    Number of Visits 29    Date for SLP Re-Evaluation 03/02/24    Authorization Type [employer]    Progress Note Due on Visit 20    SLP Start Time 1110    SLP Stop Time  1150    SLP Time Calculation (min) 40 min    Activity Tolerance Patient tolerated treatment well              Past Medical History:  Diagnosis Date   Allergic state    Amyloidosis (HCC)    Anemia    BPH (benign prostatic hyperplasia)    DDD (degenerative disc disease), lumbar    Dysplastic nevus 10/13/2013   Left posterior waistline paraspinal. Moderate to severe atypia, pattern borders on early evolving MIS, edge involved. Excised 03/31/2014, margins free.   Dysplastic nevus 12/19/2017   Right upper back medial. Mild atypia, lateral and deep margins involved.    Dysplastic nevus 11/28/2022   right prox lat thigh, Severe atypia, Excised 01/08/23   Dysrhythmia    Environmental and seasonal allergies    Gallop rhythm    GERD (gastroesophageal reflux disease)    Hypertension    Mild mitral regurgitation    a. by echo 10/2012   Pain    bil. 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 10/2012 Echo: EF 55-60%, severe LVH with speckling of myocardium -? amyloid. Mild MR, mod dil LA.   Past Surgical History:  Procedure Laterality Date   CIRCUMCISION  1984   COLONOSCOPY  03/2012   COLONOSCOPY WITH PROPOFOL N/A 10/29/2017   Procedure: COLONOSCOPY WITH PROPOFOL;  Surgeon: Christena Deem, MD;  Location: [facility] ENDOSCOPY;  Service: Endoscopy;  Laterality: N/A;   FOOT SURGERY     right foot   FOOT SURGERY Right 2017   LEG SURGERIES     HX.OF MULTIPLE  SURGERIES FOR LEG LENGTH DUE TO POLIO   LUMBAR LAMINECTOMY/DECOMPRESSION MICRODISCECTOMY N/A 01/26/2019   Procedure: LUMBAR LAMINECTOMY/DECOMPRESSION MICRODISCECTOMY 1 LEVEL L3-4;  Surgeon: Lucy Chris, MD;  Location: [facility] ORS;  Service: Neurosurgery;  Laterality: N/A;   NECK SURGERY     PILONIDAL CYST / SINUS EXCISION     Patient Active Problem List   Diagnosis Date Noted   Anemia 08/01/2020   Elevated hemoglobin A1c 08/01/2020   Loss of memory 08/01/2020   Sesamoiditis 06/25/2019   S/P lumbar laminectomy 01/26/2019   Weakness of both lower limbs 10/24/2018   LVH (left ventricular hypertrophy) 08/29/2016   Allergy 02/20/2016   Amyloidosis (HCC) 02/20/2016   Thrombocytopenia (HCC) 11/23/2015   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   Weight loss, unintentional 06/29/2013   Airway hyperreactivity 05/07/2013   Benign fibroma of prostate 05/07/2013   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 PMH of chemotherapy (2014), and polio (1954). Other PMH Primary hypertension  Elevated hemoglobin A1c  Thrombocytopenia (CMS-HCC)  Post-polio syndrome (CMS-HCC)  Other amyloidosis (CMS/HHS-HCC)  Mild intermittent asthma without complication (HHS-HCC)  Chronic diastolic heart failure (CMS/HHS-HCC  PAIN:  Are you having pain? No  FALLS: Has patient fallen in last 6 months?  See PT evaluation for details  LIVING ENVIRONMENT: Lives with:  lives with their spouse Lives in: House/apartment  PLOF:  Level of assistance: Comment: needs assistance with tasks that involve memory Employment: Retired  PATIENT GOALS: improve memory skills  SUBJECTIVE STATEMENT: Pt's wife brought in some information from previously discussed Teepa Snow Environmental education officer) on dementia Pt accompanied by: his wife  OBJECTIVE:  TODAY'S TREATMENT:                                                                                                                                         DATE:  Skilled treatment session focused on pt's cognitive communication goals. SLP facilitated session by providing the following interventions:  Pt and his wife report that it had been a "good week" - pt's wife reports increased conversation, mobility with new rolling walker, continued eagerness to perform household tasks.   PATIENT EDUCATION: Education details: see "today's treatment" above Person educated: Patient and Spouse Education method: Explanation, Demonstration, and Handouts Education comprehension: verbalized understanding and needs further education   GOALS: Goals reviewed with patient? Yes  SHORT TERM GOALS: Target date: 10 sessions Updated 12/26/2023 Updated 01/06/2024 Pt will demo knowledge that he needs to use practical compensation in a situation which has been difficult in the past, x2 sessions Baseline: Goal status: INITIAL: MET  2.  Pt and/or wife will develop a medication administration system  Baseline:  Goal status: INITIAL: MET  3.  Pt will make it on time to 100% of appointments in a 2-week period prior to 01/03/24 using compensations Baseline:  Goal status: INITIAL: MET  4.  Pt will complete a financial task using a memory compensation system prior to 01/03/24 Baseline:  Goal status: INITIAL: MET   LONG TERM GOALS: Target date: 03/02/2024 Updated 12/26/2023 Updated 01/06/2024 Pt will improve PROM compared to initial  administration Baseline:  Goal status: INITIAL: progress made: progress made  2.  Pt will successfully use a medication administration system for two weeks prior to 01/31/24 Baseline:  Goal status: INITIAL: MET  3.  Pt will make it on time to 100% of appointments in a 2-week period  after 01/03/24 using compensations Baseline:  Goal status: INITIAL: MET  4.  Pt will complete a financial responsibility using a memory compensation system, between two sessions Baseline:  Goal status: INITIAL: progress made: MET   ASSESSMENT:  CLINICAL IMPRESSION: Patient is a 78 y.o. M  who was seen today for cognitive communication deficit. Pt presents with a moderate primarily amnestic cognitive impairment. Pt and his wife report improved understanding of dementia and his wife reports decreasing moments of frustration.    See the above treatment note for more details.   OBJECTIVE IMPAIRMENTS: include memory. These impairments are limiting patient from managing medications, managing appointments, managing finances, household responsibilities, ADLs/IADLs, and effectively communicating at home and in community. Factors affecting potential to achieve goals and functional outcome are ability to learn/carryover information.. Patient will benefit from skilled SLP services to address above impairments and improve overall function.  REHAB POTENTIAL: Good  PLAN:  SLP FREQUENCY: 1-2x/week  SLP DURATION: 8 weeks  PLANNED INTERVENTIONS: 92507 Treatment of speech (30 or 45 min) , Environmental controls, Cognitive reorganization, Internal/external aids, Functional tasks, Multimodal communication approach, SLP instruction and feedback, Compensatory strategies, and Patient/family education   Florene Brill B. Dreama Saa, M.S., CCC-SLP, Tree surgeon Certified Brain Injury Specialist [facility]  [facility] Rehabilitation Services Office [phone] Ascom [phone] Fax  [phone]

## 2024-01-16 ENCOUNTER — Ambulatory Visit: Payer: PPO | Admitting: Physical Therapy

## 2024-01-16 ENCOUNTER — Ambulatory Visit: Payer: PPO | Admitting: Speech Pathology

## 2024-01-20 ENCOUNTER — Ambulatory Visit: Payer: PPO

## 2024-01-20 ENCOUNTER — Encounter: Payer: PPO | Admitting: Speech Pathology

## 2024-01-23 ENCOUNTER — Encounter: Payer: PPO | Admitting: Speech Pathology

## 2024-01-23 ENCOUNTER — Ambulatory Visit: Payer: PPO | Admitting: Physical Therapy

## 2024-01-27 ENCOUNTER — Encounter: Payer: PPO | Admitting: Speech Pathology

## 2024-01-27 ENCOUNTER — Ambulatory Visit: Payer: PPO | Admitting: Physical Therapy

## 2024-01-30 ENCOUNTER — Ambulatory Visit: Payer: PPO | Admitting: Physical Therapy

## 2024-01-30 ENCOUNTER — Encounter: Payer: PPO | Admitting: Speech Pathology

## 2024-02-03 ENCOUNTER — Ambulatory Visit: Payer: PPO | Admitting: Physical Therapy

## 2024-02-03 ENCOUNTER — Encounter: Payer: PPO | Admitting: Speech Pathology

## 2024-02-06 ENCOUNTER — Ambulatory Visit: Payer: PPO | Admitting: Physical Therapy

## 2024-02-06 ENCOUNTER — Encounter: Payer: PPO | Admitting: Speech Pathology

## 2024-02-10 ENCOUNTER — Ambulatory Visit: Payer: PPO | Admitting: Physical Therapy

## 2024-02-10 ENCOUNTER — Encounter: Payer: PPO | Admitting: Speech Pathology

## 2024-02-11 DIAGNOSIS — R8290 Unspecified abnormal findings in urine: Secondary | ICD-10-CM | POA: Diagnosis not present

## 2024-02-11 DIAGNOSIS — I10 Essential (primary) hypertension: Secondary | ICD-10-CM | POA: Diagnosis not present

## 2024-02-11 DIAGNOSIS — R7309 Other abnormal glucose: Secondary | ICD-10-CM | POA: Diagnosis not present

## 2024-02-11 DIAGNOSIS — D696 Thrombocytopenia, unspecified: Secondary | ICD-10-CM | POA: Diagnosis not present

## 2024-02-11 DIAGNOSIS — D6489 Other specified anemias: Secondary | ICD-10-CM | POA: Diagnosis not present

## 2024-02-11 DIAGNOSIS — D649 Anemia, unspecified: Secondary | ICD-10-CM | POA: Diagnosis not present

## 2024-02-13 ENCOUNTER — Encounter: Payer: PPO | Admitting: Speech Pathology

## 2024-02-13 ENCOUNTER — Ambulatory Visit: Payer: PPO | Admitting: Physical Therapy

## 2024-02-17 ENCOUNTER — Encounter: Payer: Self-pay | Admitting: Podiatry

## 2024-02-17 ENCOUNTER — Ambulatory Visit: Payer: PPO | Admitting: Podiatry

## 2024-02-17 DIAGNOSIS — M7751 Other enthesopathy of right foot: Secondary | ICD-10-CM

## 2024-02-17 DIAGNOSIS — M79676 Pain in unspecified toe(s): Secondary | ICD-10-CM

## 2024-02-17 DIAGNOSIS — D2371 Other benign neoplasm of skin of right lower limb, including hip: Secondary | ICD-10-CM

## 2024-02-17 DIAGNOSIS — B351 Tinea unguium: Secondary | ICD-10-CM

## 2024-02-17 NOTE — Progress Notes (Signed)
 He presents today for chief complaint of painful toenails and calluses bilaterally.  Objective: Pulses are palpable.  No calluses visualized.  Dropfoot right side toenails are long thick yellow dystrophic with mycotic  Assessment: Pain and secondary to onychomycosis.  Plan: Debridement of toenails 1 through 5 bilateral.

## 2024-02-18 DIAGNOSIS — R7309 Other abnormal glucose: Secondary | ICD-10-CM | POA: Diagnosis not present

## 2024-02-18 DIAGNOSIS — F3340 Major depressive disorder, recurrent, in remission, unspecified: Secondary | ICD-10-CM | POA: Diagnosis not present

## 2024-02-18 DIAGNOSIS — E859 Amyloidosis, unspecified: Secondary | ICD-10-CM | POA: Diagnosis not present

## 2024-02-18 DIAGNOSIS — I10 Essential (primary) hypertension: Secondary | ICD-10-CM | POA: Diagnosis not present

## 2024-02-18 DIAGNOSIS — D649 Anemia, unspecified: Secondary | ICD-10-CM | POA: Diagnosis not present

## 2024-02-18 DIAGNOSIS — Z0000 Encounter for general adult medical examination without abnormal findings: Secondary | ICD-10-CM | POA: Diagnosis not present

## 2024-02-18 DIAGNOSIS — G14 Postpolio syndrome: Secondary | ICD-10-CM | POA: Diagnosis not present

## 2024-02-18 DIAGNOSIS — R262 Difficulty in walking, not elsewhere classified: Secondary | ICD-10-CM | POA: Diagnosis not present

## 2024-02-18 DIAGNOSIS — N390 Urinary tract infection, site not specified: Secondary | ICD-10-CM | POA: Diagnosis not present

## 2024-02-18 DIAGNOSIS — I5032 Chronic diastolic (congestive) heart failure: Secondary | ICD-10-CM | POA: Diagnosis not present

## 2024-02-18 DIAGNOSIS — B952 Enterococcus as the cause of diseases classified elsewhere: Secondary | ICD-10-CM | POA: Diagnosis not present

## 2024-02-18 DIAGNOSIS — R413 Other amnesia: Secondary | ICD-10-CM | POA: Diagnosis not present

## 2024-02-20 ENCOUNTER — Ambulatory Visit: Payer: PPO | Admitting: Physical Therapy

## 2024-02-20 ENCOUNTER — Encounter: Payer: PPO | Admitting: Speech Pathology

## 2024-02-24 ENCOUNTER — Ambulatory Visit: Payer: PPO | Admitting: Physical Therapy

## 2024-02-24 ENCOUNTER — Encounter: Payer: PPO | Admitting: Speech Pathology

## 2024-02-24 DIAGNOSIS — D649 Anemia, unspecified: Secondary | ICD-10-CM | POA: Diagnosis not present

## 2024-02-24 DIAGNOSIS — Z1211 Encounter for screening for malignant neoplasm of colon: Secondary | ICD-10-CM | POA: Diagnosis not present

## 2024-02-27 ENCOUNTER — Ambulatory Visit: Payer: PPO | Admitting: Physical Therapy

## 2024-02-27 ENCOUNTER — Encounter: Payer: PPO | Admitting: Speech Pathology

## 2024-03-02 ENCOUNTER — Encounter: Payer: PPO | Admitting: Speech Pathology

## 2024-03-02 ENCOUNTER — Ambulatory Visit: Payer: PPO | Admitting: Physical Therapy

## 2024-03-05 ENCOUNTER — Encounter: Payer: PPO | Admitting: Speech Pathology

## 2024-03-05 ENCOUNTER — Ambulatory Visit: Payer: PPO | Admitting: Physical Therapy

## 2024-03-09 ENCOUNTER — Encounter: Payer: PPO | Admitting: Speech Pathology

## 2024-03-09 ENCOUNTER — Ambulatory Visit: Payer: PPO | Admitting: Physical Therapy

## 2024-03-12 ENCOUNTER — Encounter: Payer: PPO | Admitting: Speech Pathology

## 2024-03-12 ENCOUNTER — Ambulatory Visit: Payer: PPO | Admitting: Physical Therapy

## 2024-03-16 ENCOUNTER — Encounter: Payer: PPO | Admitting: Speech Pathology

## 2024-03-16 ENCOUNTER — Ambulatory Visit: Payer: PPO | Admitting: Physical Therapy

## 2024-03-19 ENCOUNTER — Encounter: Payer: PPO | Admitting: Speech Pathology

## 2024-03-19 ENCOUNTER — Ambulatory Visit: Payer: PPO | Admitting: Physical Therapy

## 2024-03-23 ENCOUNTER — Other Ambulatory Visit: Payer: PPO

## 2024-03-23 ENCOUNTER — Other Ambulatory Visit: Payer: Self-pay

## 2024-03-23 ENCOUNTER — Encounter: Payer: PPO | Admitting: Speech Pathology

## 2024-03-23 ENCOUNTER — Ambulatory Visit: Payer: PPO | Admitting: Physical Therapy

## 2024-03-23 ENCOUNTER — Ambulatory Visit: Payer: PPO | Admitting: Oncology

## 2024-03-26 ENCOUNTER — Encounter: Payer: PPO | Admitting: Speech Pathology

## 2024-03-26 ENCOUNTER — Ambulatory Visit: Payer: PPO | Admitting: Physical Therapy

## 2024-03-30 ENCOUNTER — Ambulatory Visit: Payer: PPO | Admitting: Physical Therapy

## 2024-03-30 ENCOUNTER — Encounter: Payer: PPO | Admitting: Speech Pathology

## 2024-04-02 ENCOUNTER — Encounter: Payer: PPO | Admitting: Speech Pathology

## 2024-04-02 ENCOUNTER — Ambulatory Visit: Payer: PPO | Admitting: Physical Therapy

## 2024-04-06 ENCOUNTER — Encounter: Payer: PPO | Admitting: Speech Pathology

## 2024-04-06 ENCOUNTER — Ambulatory Visit: Payer: PPO | Admitting: Physical Therapy

## 2024-04-08 ENCOUNTER — Inpatient Hospital Stay: Attending: Oncology

## 2024-04-08 ENCOUNTER — Inpatient Hospital Stay: Admitting: Oncology

## 2024-04-08 VITALS — BP 131/64 | HR 87 | Temp 98.2°F | Resp 18 | Ht 67.0 in | Wt 162.0 lb

## 2024-04-08 DIAGNOSIS — E8581 Light chain (AL) amyloidosis: Secondary | ICD-10-CM

## 2024-04-08 DIAGNOSIS — D696 Thrombocytopenia, unspecified: Secondary | ICD-10-CM | POA: Diagnosis not present

## 2024-04-08 DIAGNOSIS — I429 Cardiomyopathy, unspecified: Secondary | ICD-10-CM | POA: Insufficient documentation

## 2024-04-08 DIAGNOSIS — E859 Amyloidosis, unspecified: Secondary | ICD-10-CM | POA: Diagnosis not present

## 2024-04-08 LAB — CMP (CANCER CENTER ONLY)
ALT: 23 U/L (ref 0–44)
AST: 29 U/L (ref 15–41)
Albumin: 4.5 g/dL (ref 3.5–5.0)
Alkaline Phosphatase: 62 U/L (ref 38–126)
Anion gap: 11 (ref 5–15)
BUN: 25 mg/dL — ABNORMAL HIGH (ref 8–23)
CO2: 24 mmol/L (ref 22–32)
Calcium: 9.6 mg/dL (ref 8.9–10.3)
Chloride: 103 mmol/L (ref 98–111)
Creatinine: 0.98 mg/dL (ref 0.61–1.24)
GFR, Estimated: >60 mL/min (ref >60)
Glucose, Bld: 103 mg/dL — ABNORMAL HIGH (ref 70–99)
Potassium: 4.4 mmol/L (ref 3.5–5.1)
Sodium: 139 mmol/L (ref 135–145)
Total Bilirubin: 0.5 mg/dL (ref 0.0–1.2)
Total Protein: 6.8 g/dL (ref 6.5–8.1)

## 2024-04-08 LAB — CBC WITH DIFFERENTIAL (CANCER CENTER ONLY)
Abs Immature Granulocytes: 0.01 10*3/uL (ref 0.00–0.07)
Basophils Absolute: 0 10*3/uL (ref 0.0–0.1)
Basophils Relative: 1 %
Eosinophils Absolute: 0.1 10*3/uL (ref 0.0–0.5)
Eosinophils Relative: 2 %
HCT: 36.8 % — ABNORMAL LOW (ref 39.0–52.0)
Hemoglobin: 12.7 g/dL — ABNORMAL LOW (ref 13.0–17.0)
Immature Granulocytes: 0 %
Lymphocytes Relative: 20 %
Lymphs Abs: 1.2 10*3/uL (ref 0.7–4.0)
MCH: 30.8 pg (ref 26.0–34.0)
MCHC: 34.5 g/dL (ref 30.0–36.0)
MCV: 89.1 fL (ref 80.0–100.0)
Monocytes Absolute: 0.9 10*3/uL (ref 0.1–1.0)
Monocytes Relative: 14 %
Neutro Abs: 3.8 10*3/uL (ref 1.7–7.7)
Neutrophils Relative %: 63 %
Platelet Count: 137 10*3/uL — ABNORMAL LOW (ref 150–400)
RBC: 4.13 MIL/uL — ABNORMAL LOW (ref 4.22–5.81)
RDW: 13.6 % (ref 11.5–15.5)
WBC Count: 6 10*3/uL (ref 4.0–10.5)
nRBC: 0 % (ref 0.0–0.2)

## 2024-04-08 NOTE — Progress Notes (Signed)
  [facility] OFFICE PROGRESS NOTE   Diagnosis: Amyloidosis  INTERVAL HISTORY:   [name] returns as scheduled.  He is here with his wife.  She reports he got lost when driving.  He is no longer driving.  No pain.  No other complaint.  Objective:  Vital signs in last 24 hours:  Blood pressure 131/64, pulse 87, temperature 98.2 F (36.8 C), temperature source Temporal, resp. rate 18, height 5\' 7"  (1.702 m), weight 162 lb (73.5 kg), SpO2 100%.  Lymphatics: No cervical, supraclavicular, axillary, or inguinal nodes Resp: Lungs clear bilaterally Cardio: Regular rate and rhythm GI: No hepatosplenomegaly Vascular: No leg edema  Skin: No ecchymoses  Portacath/PICC-without erythema  Lab Results:  Lab Results  Component Value Date   WBC 6.0 04/08/2024   HGB 12.7 (L) 04/08/2024   HCT 36.8 (L) 04/08/2024   MCV 89.1 04/08/2024   PLT 137 (L) 04/08/2024   NEUTROABS 3.8 04/08/2024    CMP  Lab Results  Component Value Date   NA 139 04/08/2024   K 4.4 04/08/2024   CL 103 04/08/2024   CO2 24 04/08/2024   GLUCOSE 103 (H) 04/08/2024   BUN 25 (H) 04/08/2024   CREATININE 0.98 04/08/2024   CALCIUM  9.6 04/08/2024   PROT 6.8 04/08/2024   ALBUMIN 4.5 04/08/2024   AST 29 04/08/2024   ALT 23 04/08/2024   ALKPHOS 62 04/08/2024   BILITOT 0.5 04/08/2024   GFRNONAA >60 04/08/2024   GFRAA >60 01/21/2020     Medications: I have reviewed the patient's current medications.   Assessment/Plan: AL Amyloidosis, lambda light chain, diagnosed in December 2013-most recently treated with every 2 week Velcade /Decadron , last given 03/19/2014   Status post autologous stem cell collection at [facility] 04/06/2014 Urine protein improved and serum light chains stable 05/08/2016 Urine protein stable, light chains stable 07/22/2018 Urine protein stable, light chains stable 01/19/2019 Urine protein stable, light chains stable 07/20/2019 Urine protein stable, light chains stable  09/19/2020 Urine protein stable, light chains stable 03/19/2021 Urine protein stable, light chains stable 09/22/2021 Urine protein stable, light chains stable 03/22/2022 Urine protein stable, light chains stable 06/20/2023 2. 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 Followed by cardiology, echocardiogram September 2015 with an LVEF of 65-70% and severe LVH, October 2022-severe LVH with LVEF 60-65% 4. 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  Zoster rash December 2016 6.  Memory loss 7.  COVID-19 infection July 2022     Disposition: Mr Eugene Martinez appears stable.  There is no clinical evidence for progression of the amyloidosis.  He will return for an office and lab visit in 1 year.  We will follow-up on the 24-hour urine from today.  He is scheduled to see cardiology in October.    Coni Deep, MD  04/08/2024  11:24 AM

## 2024-04-09 ENCOUNTER — Ambulatory Visit: Payer: PPO | Admitting: Physical Therapy

## 2024-04-09 ENCOUNTER — Encounter: Payer: PPO | Admitting: Speech Pathology

## 2024-04-09 LAB — KAPPA/LAMBDA LIGHT CHAINS
Kappa free light chain: 20.9 mg/L — ABNORMAL HIGH (ref 3.3–19.4)
Kappa, lambda light chain ratio: 0.95 (ref 0.26–1.65)
Lambda free light chains: 21.9 mg/L (ref 5.7–26.3)

## 2024-04-13 ENCOUNTER — Ambulatory Visit: Payer: PPO | Admitting: Physical Therapy

## 2024-04-13 ENCOUNTER — Encounter: Payer: PPO | Admitting: Speech Pathology

## 2024-04-16 ENCOUNTER — Encounter: Payer: PPO | Admitting: Speech Pathology

## 2024-04-16 ENCOUNTER — Ambulatory Visit: Payer: PPO | Admitting: Physical Therapy

## 2024-04-20 ENCOUNTER — Encounter: Payer: PPO | Admitting: Speech Pathology

## 2024-04-20 ENCOUNTER — Ambulatory Visit: Payer: PPO | Admitting: Physical Therapy

## 2024-04-21 ENCOUNTER — Ambulatory Visit: Payer: Self-pay | Admitting: Oncology

## 2024-04-21 LAB — UIFE/LIGHT CHAINS/TP QN, 24-HR UR
FR KAPPA LT CH,24HR: 8.12 mg/(24.h)
FR LAMBDA LT CH,24HR: 2.35 mg/(24.h)
Free Kappa Lt Chains,Ur: 5.8 mg/L (ref 1.17–86.46)
Free Kappa/Lambda Ratio: 3.45 (ref 1.83–14.26)
Free Lambda Lt Chains,Ur: 1.68 mg/L (ref 0.27–15.21)
Total Protein, Urine-Ur/day: 172 mg/(24.h) — ABNORMAL HIGH (ref 30–150)
Total Protein, Urine: 12.3 mg/dL
Total Volume: 1400

## 2024-04-22 DIAGNOSIS — R269 Unspecified abnormalities of gait and mobility: Secondary | ICD-10-CM | POA: Diagnosis not present

## 2024-04-22 DIAGNOSIS — F03A0 Unspecified dementia, mild, without behavioral disturbance, psychotic disturbance, mood disturbance, and anxiety: Secondary | ICD-10-CM | POA: Diagnosis not present

## 2024-04-22 DIAGNOSIS — G14 Postpolio syndrome: Secondary | ICD-10-CM | POA: Diagnosis not present

## 2024-04-23 ENCOUNTER — Encounter: Payer: PPO | Admitting: Speech Pathology

## 2024-04-23 ENCOUNTER — Ambulatory Visit: Payer: PPO | Admitting: Physical Therapy

## 2024-04-27 ENCOUNTER — Ambulatory Visit: Payer: PPO | Admitting: Physical Therapy

## 2024-04-27 ENCOUNTER — Encounter: Payer: PPO | Admitting: Speech Pathology

## 2024-04-30 ENCOUNTER — Encounter: Payer: PPO | Admitting: Speech Pathology

## 2024-04-30 ENCOUNTER — Ambulatory Visit: Payer: PPO | Admitting: Physical Therapy

## 2024-05-07 ENCOUNTER — Ambulatory Visit: Payer: PPO | Admitting: Physical Therapy

## 2024-05-07 ENCOUNTER — Encounter: Payer: PPO | Admitting: Speech Pathology

## 2024-05-11 ENCOUNTER — Encounter: Payer: PPO | Admitting: Speech Pathology

## 2024-05-11 ENCOUNTER — Ambulatory Visit: Payer: PPO | Admitting: Physical Therapy

## 2024-05-11 NOTE — Therapy (Signed)
 OUTPATIENT PHYSICAL THERAPY NEURO EVALUATION   Patient Name: Eugene Martinez. MRN: [id number] DOB:January 26, 1946, 78 y.o., male Today's Date: 05/12/2024   PCP: Antonio Baumgarten, MD  REFERRING PROVIDER: Caffaro, Kaitlin T, PA-C   END OF SESSION:  PT End of Session - 05/12/24 1153     Visit Number 1    Number of Visits 24    Date for PT Re-Evaluation 08/04/24    Progress Note Due on Visit 10    PT Start Time 1108    PT Stop Time 1147    PT Time Calculation (min) 39 min    Equipment Utilized During Treatment Gait belt    Activity Tolerance Patient tolerated treatment well    Behavior During Therapy WFL for tasks assessed/performed             Past Medical History:  Diagnosis Date   Allergic state    Amyloidosis (HCC)    Anemia    BPH (benign prostatic hyperplasia)    DDD (degenerative disc disease), lumbar    Dysplastic nevus 10/13/2013   Left posterior waistline paraspinal. Moderate to severe atypia, pattern borders on early evolving MIS, edge involved. Excised 03/31/2014, margins free.   Dysplastic nevus 12/19/2017   Right upper back medial. Mild atypia, lateral and deep margins involved.    Dysplastic nevus 11/28/2022   right prox lat thigh, Severe atypia, Excised 01/08/23   Dysrhythmia    Environmental and seasonal allergies    Gallop rhythm    GERD (gastroesophageal reflux disease)    Hypertension    Mild mitral regurgitation    a. by echo 10/2012   Pain    bil. 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 10/2012 Echo: EF 55-60%, severe LVH with speckling of myocardium -? amyloid. Mild MR, mod dil LA.   Past Surgical History:  Procedure Laterality Date   CIRCUMCISION  1984   COLONOSCOPY  03/2012   COLONOSCOPY WITH PROPOFOL  N/A 10/29/2017   Procedure: COLONOSCOPY WITH PROPOFOL ;  Surgeon: Deveron Fly, MD;  Location: [facility] ENDOSCOPY;  Service: Endoscopy;  Laterality: N/A;   FOOT SURGERY     right foot   FOOT SURGERY Right 2017    LEG SURGERIES     HX.OF MULTIPLE SURGERIES FOR LEG LENGTH DUE TO POLIO   LUMBAR LAMINECTOMY/DECOMPRESSION MICRODISCECTOMY N/A 01/26/2019   Procedure: LUMBAR LAMINECTOMY/DECOMPRESSION MICRODISCECTOMY 1 LEVEL L3-4;  Surgeon: Berta Brittle, MD;  Location: [facility] ORS;  Service: Neurosurgery;  Laterality: N/A;   NECK SURGERY     PILONIDAL CYST / SINUS EXCISION     Patient Active Problem List   Diagnosis Date Noted   Anemia 08/01/2020   Elevated hemoglobin A1c 08/01/2020   Loss of memory 08/01/2020   Sesamoiditis 06/25/2019   S/P lumbar laminectomy 01/26/2019   Weakness of both lower limbs 10/24/2018   LVH (left ventricular hypertrophy) 08/29/2016   Allergy 02/20/2016   Amyloidosis (HCC) 02/20/2016   Thrombocytopenia (HCC) 11/23/2015   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   Weight loss, unintentional 06/29/2013   Airway hyperreactivity 05/07/2013   Benign fibroma of prostate 05/07/2013   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   Chronic diastolic heart failure (HCC) 11/18/2012   Proteinuria, Bence Jones 11/18/2012   Multiple allergies 10/29/2012   Hypertension 10/29/2012   RBBB (right bundle branch block with left anterior fascicular block) 10/29/2012   Proteinuria 10/29/2012    ONSET DATE: 04/22/24  REFERRING DIAG: R26.9 (ICD-10-CM) - Altered gait   THERAPY  DIAG:  Difficulty in walking, not elsewhere classified  Unsteadiness on feet  Other abnormalities of gait and mobility  Rationale for Evaluation and Treatment: Rehabilitation  SUBJECTIVE:                                                                                                                                                                                             SUBJECTIVE STATEMENT: Pt wife report he has been inconsistent with exercise since discharge. She also assist with all of subjective. Pt has been wall and furniture surfing to get around home. Pt had one pseudo fall in last 6 months; he went on a 4's into the  recycling can but could not get up. Pt has not been following trough with his exercise. Pt has been using the cane almost all of the time outside of the house in comparison to rollator.  Patient and caregiver want to work on general strength and mobility. Pt caregiver has some concerns with getting rollator into and out of the car.  As well as getting patient into and out of the car.  Pt accompanied by: significant other  PERTINENT HISTORY: . 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 No  PRECAUTIONS: Fall  RED FLAGS: None   WEIGHT BEARING RESTRICTIONS: No  FALLS: Has patient fallen in last 6 months? No fall but went to ground intentionally and could not get up   LIVING ENVIRONMENT: LIVING ENVIRONMENT: Lives with: lives with their spouse Lives in: House/apartment Stairs: 1 step into home Has following equipment at home: Retail banker - 2 wheeled *On Arts development officer for [employer] at [employer]  PLOF: Independent with household mobility with device and Independent with community mobility with device  PATIENT GOALS: Improve balance and mobility   OBJECTIVE:  Note: Objective measures were completed at Evaluation unless otherwise noted.   COGNITION: Overall cognitive status: Impaired and Mild cogintive impairment     POSTURE: rounded shoulders, forward head, and increased thoracic kyphosis  LOWER EXTREMITY ROM:   WNL for tasks assessed    LOWER EXTREMITY MMT:    MMT Right Eval Left Eval  Hip flexion 4 4  Hip extension    Hip abduction 4+ 4+  Hip adduction 4+ 4+  Hip internal rotation    Hip external rotation    Knee flexion 4- 4-  Knee extension 4 4  Ankle dorsiflexion -   Ankle plantarflexion -   Ankle inversion    Ankle eversion    (Blank rows =  not tested)  BED MOBILITY:  Not tested  TRANSFERS: Sit to  stand: Complete Independence and on a few reps noted instability in standing  Assistive device utilized: None     Stand to sit: Complete Independence  Assistive device utilized: None      GAIT: Findings: Gait Characteristics: With cane pt does not use in classic 2 or 3 point pattern but uses sporadically, decreased step length- Right, and decreased step length- Left, Distance walked: 30 ft, Assistive device utilized:Single point cane, Level of assistance: CGA, and Comments: Describing gait with SPC in R UE   FUNCTIONAL TESTS:  5 times sit to stand: 15.55 sec, imbalance on a few Timed up and go (TUG): 22.62 sec with cane  6 minute walk test: Test visit 2  10 meter walk test: 22.67 sec with cane, 13.6 sec with rollator   Berg Balance Scale: Test visit 2    PATIENT SURVEYS:  LEFS  Extreme difficulty/unable (0), Quite a bit of difficulty (1), Moderate difficulty (2), Little difficulty (3), No difficulty (4) Survey date:  WIFE FILLS OUT 05/12/24  Any of your usual work, housework or school activities 3  2. Usual hobbies, recreational or sporting activities 2  3. Getting into/out of the bath 4  4. Walking between rooms 3  5. Putting on socks/shoes 2  6. Squatting  1  7. Lifting an object, like a bag of groceries from the floor 3  8. Performing light activities around your home 3  9. Performing heavy activities around your home 2  10. Getting into/out of a car 1  11. Walking 2 blocks 2  12. Walking 1 mile 1  13. Going up/down 10 stairs (1 flight) 1  14. Standing for 1 hour 2  15.  sitting for 1 hour 4  16. Running on even ground 1  17. Running on uneven ground 1  18. Making sharp turns while running fast 1  19. Hopping  0  20. Rolling over in bed 2  Score total:  40                                                                                                                                 TREATMENT DATE:05/12/24   SELF CARE  Patient instructed in plan of care, findings for  evaluation, and ways of physical therapy may improve their function and quality of life.     PATIENT EDUCATION: Education details: POC Person educated: Patient Education method: Explanation Education comprehension: verbalized understanding   HOME EXERCISE PROGRAM: Establish visit 2    GOALS: Goals reviewed with patient? Yes  SHORT TERM GOALS: Target date: 06/09/2024       Patient will be independent in home exercise program to improve strength/mobility for better functional independence with ADLs. Baseline: No HEP currently  Goal status: INITIAL  LONG TERM GOALS: Target date: 08/04/2024  1.  Patient will complete five times sit to stand test in <  15 seconds without LOB indicating an increased LE strength and improved balance. Baseline: 15.55 sec, imbalance on a few reps requiring CGA for safety Goal status: INITIAL  2.  Patient will improve LEFS score to 50   to demonstrate statistically significant improvement in mobility and quality of life as it relates to their LE strength and mobility.  Baseline: 40 Goal status: INITIAL   3.  Patient will increase Berg Balance score by > 6 points to demonstrate decreased fall risk during functional activities. Baseline: Test visit 2  Goal status: INITIAL   4.   Patient will reduce timed up and go to <11 seconds to reduce fall risk and demonstrate improved transfer/gait ability. Baseline: 22.62 sec with SPC  Goal status: INITIAL  5.   Patient will increase 10 meter walk test to >1.96m/s as to improve gait speed for better community ambulation and to reduce fall risk. Baseline: .44 m/s w/ SPC, .73 m/s with rollator Goal status: INITIAL  6.   Patient will increase six minute walk test distance by 150 ft or greater for progression to community ambulator and improve gait ability Baseline: Test visit 2  Goal status: INITIAL      ASSESSMENT:  CLINICAL IMPRESSION:  Patient is a 78 year old male who presents to physical  therapy for treatment of decreased mobility and imbalance.  Patient's wife's been encouraging him to use a rollator patient has mostly been using cane.  With functional testing this date patient's mobility with a cane with significantly impaired compared to rollator although both demonstrated areas of impairment.  Patient's gait speed is well below age-matched norms particularly with a cane versus rollator.  Based on patient's testing patient is at increased risk factor for falls will continue to assess patient's endurance and balance next session as well as integrate new and updated home exercise program and initiate plan of care.  She will benefit physical therapy to improve his balance, strength, mobility, and quality of life.  OBJECTIVE IMPAIRMENTS: Abnormal gait, decreased activity tolerance, decreased balance, decreased endurance, decreased knowledge of condition, decreased knowledge of use of DME, and decreased strength.   ACTIVITY LIMITATIONS: standing, squatting, stairs, transfers, and locomotion level  PARTICIPATION LIMITATIONS: meal prep, shopping, and community activity  PERSONAL FACTORS: Age, Behavior pattern, and 3+ comorbidities: HTN, Chronic Diastolic Heart Failure, post polio,  are also affecting patient's functional outcome.   REHAB POTENTIAL: Good  CLINICAL DECISION MAKING: Evolving/moderate complexity  EVALUATION COMPLEXITY: Moderate  PLAN:  PT FREQUENCY: 2x/week  PT DURATION: 12 weeks  PLANNED INTERVENTIONS: 97750- Physical Performance Testing, 97110-Therapeutic exercises, 97530- Therapeutic activity, [id number]- Neuromuscular re-education, 97535- Self Care, [id number]- Manual therapy, and [id number]- Gait training  PLAN FOR NEXT SESSION: 6 MWT, BERG, alter HEP ( mini squats instead of STS), assess mobility for car transfers    Edwina Gram, PT 05/12/2024, 11:55 AM

## 2024-05-12 ENCOUNTER — Encounter: Payer: Self-pay | Admitting: Physical Therapy

## 2024-05-12 ENCOUNTER — Ambulatory Visit: Admitting: Physical Therapy

## 2024-05-12 DIAGNOSIS — R2689 Other abnormalities of gait and mobility: Secondary | ICD-10-CM | POA: Insufficient documentation

## 2024-05-12 DIAGNOSIS — M6281 Muscle weakness (generalized): Secondary | ICD-10-CM | POA: Diagnosis not present

## 2024-05-12 DIAGNOSIS — R262 Difficulty in walking, not elsewhere classified: Secondary | ICD-10-CM | POA: Diagnosis not present

## 2024-05-12 DIAGNOSIS — R2681 Unsteadiness on feet: Secondary | ICD-10-CM | POA: Insufficient documentation

## 2024-05-12 DIAGNOSIS — R278 Other lack of coordination: Secondary | ICD-10-CM | POA: Diagnosis not present

## 2024-05-14 ENCOUNTER — Ambulatory Visit: Payer: PPO | Admitting: Physical Therapy

## 2024-05-14 ENCOUNTER — Ambulatory Visit: Admitting: Physical Therapy

## 2024-05-14 ENCOUNTER — Encounter: Payer: PPO | Admitting: Speech Pathology

## 2024-05-14 DIAGNOSIS — R262 Difficulty in walking, not elsewhere classified: Secondary | ICD-10-CM

## 2024-05-14 DIAGNOSIS — R2681 Unsteadiness on feet: Secondary | ICD-10-CM

## 2024-05-14 DIAGNOSIS — R2689 Other abnormalities of gait and mobility: Secondary | ICD-10-CM

## 2024-05-14 NOTE — Therapy (Signed)
 OUTPATIENT PHYSICAL THERAPY NEURO TREATMENT   Patient Name: Eugene Martinez. MRN: [id number] DOB:[birth year], 78 y.o., male Today's Date: 05/14/2024   PCP: Antonio Baumgarten, MD  REFERRING PROVIDER: Caffaro, Kaitlin T, PA-C   END OF SESSION:  PT End of Session - 05/14/24 1645     Visit Number 2    Number of Visits 24    Date for PT Re-Evaluation 08/04/24    Progress Note Due on Visit 10    PT Start Time 1617    PT Stop Time 1657    PT Time Calculation (min) 40 min    Equipment Utilized During Treatment Gait belt    Activity Tolerance Patient tolerated treatment well    Behavior During Therapy WFL for tasks assessed/performed              Past Medical History:  Diagnosis Date   Allergic state    Amyloidosis (HCC)    Anemia    BPH (benign prostatic hyperplasia)    DDD (degenerative disc disease), lumbar    Dysplastic nevus 10/13/2013   Left posterior waistline paraspinal. Moderate to severe atypia, pattern borders on early evolving MIS, edge involved. Excised 03/31/2014, margins free.   Dysplastic nevus 12/19/2017   Right upper back medial. Mild atypia, lateral and deep margins involved.    Dysplastic nevus 11/28/2022   right prox lat thigh, Severe atypia, Excised 01/08/23   Dysrhythmia    Environmental and seasonal allergies    Gallop rhythm    GERD (gastroesophageal reflux disease)    Hypertension    Mild mitral regurgitation    a. by echo 10/2012   Pain    bil. 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 10/2012 Echo: EF 55-60%, severe LVH with speckling of myocardium -? amyloid. Mild MR, mod dil LA.   Past Surgical History:  Procedure Laterality Date   CIRCUMCISION  1984   COLONOSCOPY  03/2012   COLONOSCOPY WITH PROPOFOL  N/A 10/29/2017   Procedure: COLONOSCOPY WITH PROPOFOL ;  Surgeon: Deveron Fly, MD;  Location: [facility] ENDOSCOPY;  Service: Endoscopy;  Laterality: N/A;   FOOT SURGERY     right foot   FOOT SURGERY Right 2017    LEG SURGERIES     HX.OF MULTIPLE SURGERIES FOR LEG LENGTH DUE TO POLIO   LUMBAR LAMINECTOMY/DECOMPRESSION MICRODISCECTOMY N/A 01/26/2019   Procedure: LUMBAR LAMINECTOMY/DECOMPRESSION MICRODISCECTOMY 1 LEVEL L3-4;  Surgeon: Berta Brittle, MD;  Location: [facility] ORS;  Service: Neurosurgery;  Laterality: N/A;   NECK SURGERY     PILONIDAL CYST / SINUS EXCISION     Patient Active Problem List   Diagnosis Date Noted   Anemia 08/01/2020   Elevated hemoglobin A1c 08/01/2020   Loss of memory 08/01/2020   Sesamoiditis 06/25/2019   S/P lumbar laminectomy 01/26/2019   Weakness of both lower limbs 10/24/2018   LVH (left ventricular hypertrophy) 08/29/2016   Allergy 02/20/2016   Amyloidosis (HCC) 02/20/2016   Thrombocytopenia (HCC) 11/23/2015   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  Rationale for Evaluation and Treatment: Rehabilitation  SUBJECTIVE:                                                                                                                                                                                             SUBJECTIVE STATEMENT:  Pt and caregiver report increased use of rollator since last visit and his gait speed improved as a result. No falls or LOB. Pt completed HEP yesterday as well.     From Eval:Pt wife report he has been inconsistent with exercise since discharge. She also assist with all of subjective. Pt has been wall and furniture surfing to get  around home. Pt had one pseudo fall in last 6 months; he went on a 4's into the recycling can but could not get up. Pt has not been following trough with his exercise. Pt has been using the cane almost all of the time outside of the house in comparison to rollator.  Patient and caregiver want to work on general strength and mobility. Pt caregiver has some concerns with getting rollator into and out of the car.  As well as getting patient into and out of the car.  Pt accompanied by: significant other  PERTINENT HISTORY: . 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 No  PRECAUTIONS: Fall  RED FLAGS: None   WEIGHT BEARING RESTRICTIONS: No  FALLS: Has patient fallen in last 6 months? No fall but went to ground intentionally and could not get up   LIVING ENVIRONMENT: LIVING ENVIRONMENT: Lives with: lives with their spouse Lives in: House/apartment Stairs: 1 step into home Has following equipment at home: Retail banker - 2 wheeled *On Arts development officer for [place] [employer] at [employer]  PLOF: Independent with household mobility with device and Independent with community mobility with device  PATIENT GOALS: Improve balance and mobility   OBJECTIVE:  Note: Objective measures were completed at Evaluation unless otherwise noted.   COGNITION: Overall cognitive status: Impaired and Mild cogintive impairment     POSTURE: rounded shoulders, forward head, and increased thoracic kyphosis  LOWER EXTREMITY ROM:   WNL for tasks assessed    LOWER EXTREMITY MMT:    MMT Right Eval Left Eval  Hip flexion 4 4  Hip extension    Hip abduction 4+ 4+  Hip adduction 4+ 4+  Hip internal rotation    Hip external rotation    Knee flexion 4- 4-  Knee extension 4 4  Ankle dorsiflexion -   Ankle plantarflexion -   Ankle inversion    Ankle eversion     (Blank rows = not tested)  BED MOBILITY:  Not tested  TRANSFERS: Sit to stand: Complete Independence and on a few reps noted instability in standing  Assistive device utilized: None     Stand to sit: Complete Independence  Assistive device utilized: None      GAIT: Findings: Gait Characteristics: With cane pt does not use in classic 2 or 3 point pattern but uses sporadically, decreased step length- Right, and decreased step length- Left, Distance walked: 30 ft, Assistive device utilized:Single point cane, Level of assistance: CGA, and Comments: Describing gait with SPC in R UE   FUNCTIONAL TESTS:  5 times sit to stand: 15.55 sec, imbalance on a few Timed up and go (TUG): 22.62 sec with cane  6 minute walk test: Test visit 2  10 meter walk test: 22.67 sec with cane, 13.6 sec with rollator   Berg Balance Scale: Test visit 2    PATIENT SURVEYS:  LEFS  Extreme difficulty/unable (0), Quite a bit of difficulty (1), Moderate difficulty (2), Little difficulty (3), No difficulty (4) Survey date:  WIFE FILLS OUT 05/12/24  Any of your usual work, housework or school activities 3  2. Usual hobbies, recreational or sporting activities 2  3. Getting into/out of the bath 4  4. Walking between rooms 3  5. Putting on socks/shoes 2  6. Squatting  1  7. Lifting an object, like a bag of groceries from the floor 3  8. Performing light activities around your home 3  9. Performing heavy activities around your home 2  10. Getting into/out of a car 1  11. Walking 2 blocks 2  12. Walking 1 mile 1  13. Going up/down 10 stairs (1 flight) 1  14. Standing for 1 hour 2  15.  sitting for 1 hour 4  16. Running on even ground 1  17. Running on uneven ground 1  18. Making sharp turns while running fast 1  19. Hopping  0  20. Rolling over in bed 2  Score total:  40                                                                                                                                 TREATMENT  DATE:05/14/24   PHYSICAL PERFORMANCE Patient demonstrates increased fall risk as noted by score of  38 /56 on Berg Balance Scale.  (<36= high risk for falls, close to 100%; 37-45 significant >80%; 46-51 moderate >50%; 52-55 lower >25%)  [facility] PT Assessment - 05/14/24 0001       Standardized Balance Assessment   Standardized Balance Assessment Berg Balance Test (P)    Simultaneous filing. User may not have seen previous data.     Berg Balance Test   Sit to Stand Able to stand without using hands and stabilize independently  Standing Unsupported Able to stand safely 2 minutes    Sitting with Back Unsupported but Feet Supported on Floor or Stool Able to sit safely and securely 2 minutes    Stand to Sit Sits safely with minimal use of hands    Transfers Able to transfer safely, definite need of hands    Standing Unsupported with Eyes Closed Able to stand 10 seconds with supervision    Standing Unsupported with Feet Together Able to place feet together independently and stand for 1 minute with supervision    From Standing, Reach Forward with Outstretched Arm Can reach forward >5 cm safely (2")    From Standing Position, Pick up Object from Floor Able to pick up shoe, needs supervision    From Standing Position, Turn to Look Behind Over each Shoulder Turn sideways only but maintains balance    Turn 360 Degrees Needs close supervision or verbal cueing    Standing Unsupported, Alternately Place Feet on Step/Stool Able to complete >2 steps/needs minimal assist    Standing Unsupported, One Foot in Front Able to take small step independently and hold 30 seconds    Standing on One Leg Able to lift leg independently and hold equal to or more than 3 seconds    Total Score 38            6 Min Walk Test:  Instructed patient to ambulate as quickly and as safely as possible for 6 minutes using LRAD. Patient was allowed to take standing rest breaks without stopping the test, but if the patient  required a sitting rest break the clock would be stopped and the test would be over.  Results: 750 feet using a 4WW with CGA. Pt shows increased flexed posture and shuffling as well as decreased LLE foot clearance throughout. Results indicate that the patient has reduced endurance with ambulation compared to age matched norms.  Age Matched Norms (in meters): 38-69 yo M: 24 F: 75, 27-79 yo M: 12 F: 471, 31-89 yo M: 417 F: 392 MDC: 58.21 meters (190.98 feet) or 50 meters (ANPTA Core Set of Outcome Measures for Adults with Neurologic Conditions, 2018)  TA- To improve functional movements patterns for everyday tasks   Seated hurdle step over for simulation of getting in and out of car hip movement 2 x 10 ea LE  - cues for proper performance Mini squat at support bar with YTB around knees to improve gluteal activation and hip alignment x 10, x 12   PATIENT EDUCATION: Education details: Pt educated throughout session about proper posture and technique with exercises. Improved exercise technique, movement at target joints, use of target muscles after min to mod verbal, visual, tactile cues. Person educated: Patient Education method: Explanation Education comprehension: verbalized understanding   HOME EXERCISE PROGRAM: Verbally added mini squat at bar to replace STS. No new handout provided, has handout from previous PT    GOALS: Goals reviewed with patient? Yes  SHORT TERM GOALS: Target date: 06/09/2024       Patient will be independent in home exercise program to improve strength/mobility for better functional independence with ADLs. Baseline: No HEP currently  Goal status: INITIAL  LONG TERM GOALS: Target date: 08/04/2024  1.  Patient will complete five times sit to stand test in < 15 seconds without LOB indicating an increased LE strength and improved balance. Baseline: 15.55 sec, imbalance on a few reps requiring CGA for safety Goal status: INITIAL  2.  Patient will improve  LEFS score to  50   to demonstrate statistically significant improvement in mobility and quality of life as it relates to their LE strength and mobility.  Baseline: 40 Goal status: INITIAL   3.  Patient will increase Berg Balance score by > 6 points to demonstrate decreased fall risk during functional activities. Baseline: 38 Goal status: INITIAL   4.   Patient will reduce timed up and go to <11 seconds to reduce fall risk and demonstrate improved transfer/gait ability. Baseline: 22.62 sec with SPC  Goal status: INITIAL  5.   Patient will increase 10 meter walk test to >1.19m/s as to improve gait speed for better community ambulation and to reduce fall risk. Baseline: .44 m/s w/ SPC, .73 m/s with rollator Goal status: INITIAL  6.   Patient will increase six minute walk test distance by 150 ft or greater for progression to community ambulator and improve gait ability Baseline: 750 ft, increased shuffling style gait from minute 2 and on as well as increased trunk flexion from this point on as well.  Goal status: INITIAL      ASSESSMENT:  CLINICAL IMPRESSION:  Patient presents with good motivation for completion of physical therapy activities.  Patient has started using rollator more at home in the community since his initial evaluation in him and his wife had noticed an improvement in his gait quality and gait speed and his ability to keep up with her with these activities. Patient demonstrates significant balance deficits as evidenced by Randye Buttner balance test indicating increased risk for falls and patient also demonstrates decreased community based ambulation capacity as evidenced by 6-minute walk test results. Pt required cues for proper performance of exercises but improved with practice. Pt will continue to benefit from skilled physical therapy intervention to address impairments, improve QOL, and attain therapy goals.    OBJECTIVE IMPAIRMENTS: Abnormal gait, decreased activity  tolerance, decreased balance, decreased endurance, decreased knowledge of condition, decreased knowledge of use of DME, and decreased strength.   ACTIVITY LIMITATIONS: standing, squatting, stairs, transfers, and locomotion level  PARTICIPATION LIMITATIONS: meal prep, shopping, and community activity  PERSONAL FACTORS: Age, Behavior pattern, and 3+ comorbidities: HTN, Chronic Diastolic Heart Failure, post polio,  are also affecting patient's functional outcome.   REHAB POTENTIAL: Good  CLINICAL DECISION MAKING: Evolving/moderate complexity  EVALUATION COMPLEXITY: Moderate  PLAN:  PT FREQUENCY: 2x/week  PT DURATION: 12 weeks  PLANNED INTERVENTIONS: 97750- Physical Performance Testing, 97110-Therapeutic exercises, 97530- Therapeutic activity, [id number]- Neuromuscular re-education, 97535- Self Care, 16109- Manual therapy, and 97116- Gait training  PLAN FOR NEXT SESSION: 6 MWT, BERG, alter HEP ( mini squats instead of STS), assess mobility for car transfers    Edwina Gram, PT 05/14/2024, 4:45 PM

## 2024-05-18 ENCOUNTER — Ambulatory Visit

## 2024-05-18 ENCOUNTER — Ambulatory Visit: Payer: PPO | Admitting: Physical Therapy

## 2024-05-18 ENCOUNTER — Encounter: Payer: PPO | Admitting: Speech Pathology

## 2024-05-18 DIAGNOSIS — R262 Difficulty in walking, not elsewhere classified: Secondary | ICD-10-CM

## 2024-05-18 DIAGNOSIS — R278 Other lack of coordination: Secondary | ICD-10-CM

## 2024-05-18 DIAGNOSIS — R2681 Unsteadiness on feet: Secondary | ICD-10-CM

## 2024-05-18 DIAGNOSIS — R2689 Other abnormalities of gait and mobility: Secondary | ICD-10-CM

## 2024-05-18 DIAGNOSIS — M6281 Muscle weakness (generalized): Secondary | ICD-10-CM

## 2024-05-18 NOTE — Therapy (Signed)
 OUTPATIENT PHYSICAL THERAPY NEURO TREATMENT   Patient Name: Eugene Martinez. MRN: [id number] DOB:04/15/1946, 78 y.o., male Today's Date: 05/18/2024   PCP: Antonio Baumgarten, MD  REFERRING PROVIDER: Caffaro, Kaitlin T, PA-C   END OF SESSION:  PT End of Session - 05/18/24 1023     Visit Number 3    Number of Visits 24    Date for PT Re-Evaluation 08/04/24    PT Start Time 1145    PT Stop Time 1230    PT Time Calculation (min) 45 min    Equipment Utilized During Treatment Gait belt    Activity Tolerance Patient tolerated treatment well    Behavior During Therapy WFL for tasks assessed/performed               Past Medical History:  Diagnosis Date   Allergic state    Amyloidosis (HCC)    Anemia    BPH (benign prostatic hyperplasia)    DDD (degenerative disc disease), lumbar    Dysplastic nevus 10/13/2013   Left posterior waistline paraspinal. Moderate to severe atypia, pattern borders on early evolving MIS, edge involved. Excised 03/31/2014, margins free.   Dysplastic nevus 12/19/2017   Right upper back medial. Mild atypia, lateral and deep margins involved.    Dysplastic nevus 11/28/2022   right prox lat thigh, Severe atypia, Excised 01/08/23   Dysrhythmia    Environmental and seasonal allergies    Gallop rhythm    GERD (gastroesophageal reflux disease)    Hypertension    Mild mitral regurgitation    a. by echo 10/2012   Pain    bil. 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 10/2012 Echo: EF 55-60%, severe LVH with speckling of myocardium -? amyloid. Mild MR, mod dil LA.   Past Surgical History:  Procedure Laterality Date   CIRCUMCISION  1984   COLONOSCOPY  03/2012   COLONOSCOPY WITH PROPOFOL  N/A 10/29/2017   Procedure: COLONOSCOPY WITH PROPOFOL ;  Surgeon: Deveron Fly, MD;  Location: [facility] ENDOSCOPY;  Service: Endoscopy;  Laterality: N/A;   FOOT SURGERY     right foot   FOOT SURGERY Right 2017   LEG SURGERIES     HX.OF MULTIPLE  SURGERIES FOR LEG LENGTH DUE TO POLIO   LUMBAR LAMINECTOMY/DECOMPRESSION MICRODISCECTOMY N/A 01/26/2019   Procedure: LUMBAR LAMINECTOMY/DECOMPRESSION MICRODISCECTOMY 1 LEVEL L3-4;  Surgeon: Berta Brittle, MD;  Location: [facility] ORS;  Service: Neurosurgery;  Laterality: N/A;   NECK SURGERY     PILONIDAL CYST / SINUS EXCISION     Patient Active Problem List   Diagnosis Date Noted   Anemia 08/01/2020   Elevated hemoglobin A1c 08/01/2020   Loss of memory 08/01/2020   Sesamoiditis 06/25/2019   S/P lumbar laminectomy 01/26/2019   Weakness of both lower limbs 10/24/2018   LVH (left ventricular hypertrophy) 08/29/2016   Allergy 02/20/2016   Amyloidosis (HCC) 02/20/2016   Thrombocytopenia (HCC) 11/23/2015   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   Weight loss, unintentional 06/29/2013   Airway hyperreactivity 05/07/2013   Benign fibroma of prostate 05/07/2013   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   Chronic diastolic heart failure (HCC) 11/18/2012   Proteinuria, Bence Jones 11/18/2012   Multiple allergies 10/29/2012   Hypertension 10/29/2012   RBBB (right bundle branch block with left anterior fascicular block) 10/29/2012   Proteinuria 10/29/2012    ONSET DATE: 04/22/24  REFERRING DIAG: R26.9 (ICD-10-CM) - Altered gait   THERAPY DIAG:  Difficulty in walking, not elsewhere  classified  Other abnormalities of gait and mobility  Muscle weakness (generalized)  Unsteadiness on feet  Other lack of coordination  Rationale for Evaluation and Treatment: Rehabilitation  SUBJECTIVE:                                                                                                                                                                                             SUBJECTIVE STATEMENT:  Pt reports he is feeling good today, motivated and ready for PT. Wife reports he has been inconsistent with HEP over the weekend. Pt denies any pain today.    From Eval:Pt wife report he has been  inconsistent with exercise since discharge. She also assist with all of subjective. Pt has been wall and furniture surfing to get around home. Pt had one pseudo fall in last 6 months; he went on a 4's into the recycling can but could not get up. Pt has not been following trough with his exercise. Pt has been using the cane almost all of the time outside of the house in comparison to rollator.  Patient and caregiver want to work on general strength and mobility. Pt caregiver has some concerns with getting rollator into and out of the car.  As well as getting patient into and out of the car.  Pt accompanied by: significant other  PERTINENT HISTORY: . 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 No  PRECAUTIONS: Fall  RED FLAGS: None   WEIGHT BEARING RESTRICTIONS: No  FALLS: Has patient fallen in last 6 months? 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   COGNITION: Overall cognitive status: Impaired and Mild cogintive impairment     POSTURE: rounded shoulders, forward head, and increased thoracic kyphosis  LOWER EXTREMITY ROM:   WNL for tasks assessed    LOWER EXTREMITY MMT:    MMT Right Eval Left Eval  Hip flexion 4 4  Hip extension    Hip abduction 4+ 4+  Hip adduction 4+ 4+  Hip internal rotation    Hip external rotation  Knee flexion 4- 4-  Knee extension 4 4  Ankle dorsiflexion -   Ankle plantarflexion -   Ankle inversion    Ankle eversion    (Blank rows = not tested)  BED MOBILITY:  Not tested  TRANSFERS: Sit to stand: Complete Independence and on a few reps noted instability in standing  Assistive device utilized: None     Stand to sit: Complete Independence  Assistive device utilized: None      GAIT: Findings: Gait Characteristics: With cane pt does not use in classic 2 or 3 point pattern but uses sporadically, decreased step length- Right, and decreased step length- Left, Distance walked: 30 ft, Assistive device utilized:Single point cane, Level of assistance: CGA, and Comments: Describing gait with SPC in R UE   FUNCTIONAL TESTS:  5 times sit to stand: 15.55 sec, imbalance on a few Timed up and go (TUG): 22.62 sec with cane  6 minute walk test: Test visit 2  10 meter walk test: 22.67 sec with cane, 13.6 sec with rollator   Berg Balance Scale: Test visit 2    PATIENT SURVEYS:  LEFS  Extreme difficulty/unable (0), Quite a bit of difficulty (1), Moderate difficulty (2), Little difficulty (3), No difficulty (4) Survey date:  WIFE FILLS OUT 05/12/24  Any of your usual work, housework or school activities 3  2. Usual hobbies, recreational or sporting activities 2  3. Getting into/out of the bath 4  4. Walking between rooms 3  5. Putting on socks/shoes 2  6. Squatting  1  7. Lifting an object, like a bag of groceries from the floor 3  8. Performing light activities around your home 3  9. Performing heavy activities around your home 2  10. Getting into/out of a car 1  11. Walking 2 blocks 2  12. Walking 1 mile 1  13. Going up/down 10 stairs (1 flight) 1  14. Standing for 1 hour 2  15.  sitting for 1 hour 4  16. Running on even ground 1  17. Running on uneven ground 1  18. Making sharp turns while running fast 1  19. Hopping  0  20. Rolling over in bed 2  Score total:  40                                                                                                                                  TREATMENT DATE: 05/18/24   TA- To improve functional movements patterns for everyday tasks   Seated hurdle step over for simulation of getting in and out of car hip movement 2 x 10 ea LE   - cues for proper performance   2.5# AW 1st set, 5# AW 2nd and 3rd set.    Mini squat at support bar with RTB around knees to improve gluteal activation and hip alignment 3x10  With Heavy BUE support patient was able to perform a full squat. Cues given to  reduce UE support and depth of squat to increase quad activation.   TE- To improve strength, endurance, mobility, and function of specific targeted muscle groups or improve joint range of motion or improve muscle flexibility   HS flexion with BTB - 2x10   LAQ BLE 7.5# AW - 3x10     Gait training  450 ft with 4UJ. Pt noted to shuffle feet often during gait. Verbal and visual cues provided for increasing step length and gaze orientation. Fair carryover after cues. CGA.       PATIENT EDUCATION: Education details: Pt educated throughout session about proper posture and technique with exercises. Improved exercise technique, movement at target joints, use of target muscles after min to mod verbal, visual, tactile cues. Person educated: Patient Education method: Explanation Education comprehension: verbalized understanding   HOME EXERCISE PROGRAM: Verbally added mini squat at bar to replace STS. No new handout provided, has handout from previous PT  Verbally added HS flexion with BTB to HEP. No new handout provided, has handout from previous PT    GOALS: Goals reviewed with patient? Yes  SHORT TERM GOALS: Target date: 06/09/2024       Patient will be independent in home exercise program to improve strength/mobility for better functional independence with ADLs. Baseline: No HEP currently  Goal status: INITIAL  LONG TERM  GOALS: Target date: 08/04/2024  1.  Patient will complete five times sit to stand test in < 15 seconds without LOB indicating an increased LE strength and improved balance. Baseline: 15.55 sec, imbalance on a few reps requiring CGA for safety Goal status: INITIAL  2.  Patient will improve LEFS score to 50   to demonstrate statistically significant improvement in mobility and quality of life as it relates to their LE strength and mobility.  Baseline: 40 Goal status: INITIAL   3.  Patient will increase Berg Balance score by > 6 points to demonstrate decreased fall risk during functional activities. Baseline: 38 Goal status: INITIAL   4.   Patient will reduce timed up and go to <11 seconds to reduce fall risk and demonstrate improved transfer/gait ability. Baseline: 22.62 sec with SPC  Goal status: INITIAL  5.   Patient will increase 10 meter walk test to >1.14m/s as to improve gait speed for better community ambulation and to reduce fall risk. Baseline: .44 m/s w/ SPC, .73 m/s with rollator Goal status: INITIAL  6.   Patient will increase six minute walk test distance by 150 ft or greater for progression to community ambulator and improve gait ability Baseline: 750 ft, increased shuffling style gait from minute 2 and on as well as increased trunk flexion from this point on as well.  Goal status: INITIAL      ASSESSMENT:  CLINICAL IMPRESSION: Pt presents to clinic in good spirits motivated for PT activities. Pt wife reports inconsistency with HEP over the weekend, but pt has been using rollator more since initial eval. Pt demos good quad and hip flexor strength as evidenced by ease of LAQ and hip flexor intervention, but required constant verbal and tactile cues for form and technique. Pt displays shuffled gait with occasional downward gaze c 4WW. Verbal and visual cues to increase step length and pick up knees during swing phase provided with fair carryover. Pt also displayed limited  hip extension during gait which may impact pt's gait speed which relates to falls risk. Heavy emphasis next session on foot clearance and hip extension to improve gait next session. Pt will  continue to benefit from skilled physical therapy intervention to address impairments, improve QOL, and attain therapy goals.    OBJECTIVE IMPAIRMENTS: Abnormal gait, decreased activity tolerance, decreased balance, decreased endurance, decreased knowledge of condition, decreased knowledge of use of DME, and decreased strength.   ACTIVITY LIMITATIONS: standing, squatting, stairs, transfers, and locomotion level  PARTICIPATION LIMITATIONS: meal prep, shopping, and community activity  PERSONAL FACTORS: Age, Behavior pattern, and 3+ comorbidities: HTN, Chronic Diastolic Heart Failure, post polio,  are also affecting patient's functional outcome.   REHAB POTENTIAL: Good  CLINICAL DECISION MAKING: Evolving/moderate complexity  EVALUATION COMPLEXITY: Moderate  PLAN:  PT FREQUENCY: 2x/week  PT DURATION: 12 weeks  PLANNED INTERVENTIONS: 97750- Physical Performance Testing, 97110-Therapeutic exercises, 97530- Therapeutic activity, [id number]- Neuromuscular re-education, 97535- Self Care, [id number]- Manual therapy, and 97116- Gait training  PLAN FOR NEXT SESSION: Progress Hip flexor intervention, consider leg press for strengthening, target hip abductors and extensors.    [place], SPT  [place]. Fairly IV, PT, DPT Physical Therapist- [place]  [facility] 05/18/2024, 4:17 PM

## 2024-05-20 ENCOUNTER — Ambulatory Visit: Admitting: Podiatry

## 2024-05-20 ENCOUNTER — Encounter: Payer: Self-pay | Admitting: Podiatry

## 2024-05-20 ENCOUNTER — Ambulatory Visit

## 2024-05-20 DIAGNOSIS — M79676 Pain in unspecified toe(s): Secondary | ICD-10-CM

## 2024-05-20 DIAGNOSIS — R262 Difficulty in walking, not elsewhere classified: Secondary | ICD-10-CM | POA: Diagnosis not present

## 2024-05-20 DIAGNOSIS — B351 Tinea unguium: Secondary | ICD-10-CM

## 2024-05-20 DIAGNOSIS — M7751 Other enthesopathy of right foot: Secondary | ICD-10-CM

## 2024-05-20 DIAGNOSIS — R2689 Other abnormalities of gait and mobility: Secondary | ICD-10-CM

## 2024-05-20 DIAGNOSIS — R2681 Unsteadiness on feet: Secondary | ICD-10-CM

## 2024-05-20 DIAGNOSIS — D2371 Other benign neoplasm of skin of right lower limb, including hip: Secondary | ICD-10-CM

## 2024-05-20 DIAGNOSIS — R278 Other lack of coordination: Secondary | ICD-10-CM

## 2024-05-20 DIAGNOSIS — M6281 Muscle weakness (generalized): Secondary | ICD-10-CM

## 2024-05-20 NOTE — Therapy (Signed)
 OUTPATIENT PHYSICAL THERAPY NEURO TREATMENT   Patient Name: Eugene Martinez. MRN: [id number] DOB:02/25/1946, 78 y.o., male Today's Date: 05/21/2024   PCP: Antonio Baumgarten, MD  REFERRING PROVIDER: Caffaro, Kaitlin T, PA-C   END OF SESSION:  PT End of Session - 05/20/24 1722     Visit Number 4    Number of Visits 25    Date for PT Re-Evaluation 08/04/24    Progress Note Due on Visit 10    PT Start Time 1618    PT Stop Time 1650    PT Time Calculation (min) 32 min    Equipment Utilized During Treatment Gait belt    Activity Tolerance Patient tolerated treatment well    Behavior During Therapy WFL for tasks assessed/performed             Past Medical History:  Diagnosis Date   Allergic state    Amyloidosis (HCC)    Anemia    BPH (benign prostatic hyperplasia)    DDD (degenerative disc disease), lumbar    Dysplastic nevus 10/13/2013   Left posterior waistline paraspinal. Moderate to severe atypia, pattern borders on early evolving MIS, edge involved. Excised 03/31/2014, margins free.   Dysplastic nevus 12/19/2017   Right upper back medial. Mild atypia, lateral and deep margins involved.    Dysplastic nevus 11/28/2022   right prox lat thigh, Severe atypia, Excised 01/08/23   Dysrhythmia    Environmental and seasonal allergies    Gallop rhythm    GERD (gastroesophageal reflux disease)    Hypertension    Mild mitral regurgitation    a. by echo 10/2012   Pain    bil. 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 10/2012 Echo: EF 55-60%, severe LVH with speckling of myocardium -? amyloid. Mild MR, mod dil LA.   Past Surgical History:  Procedure Laterality Date   CIRCUMCISION  1984   COLONOSCOPY  03/2012   COLONOSCOPY WITH PROPOFOL  N/A 10/29/2017   Procedure: COLONOSCOPY WITH PROPOFOL ;  Surgeon: Deveron Fly, MD;  Location: [facility];  Service: Endoscopy;  Laterality: N/A;   FOOT SURGERY     right foot   FOOT SURGERY Right 2017    LEG SURGERIES     HX.OF MULTIPLE SURGERIES FOR LEG LENGTH DUE TO POLIO   LUMBAR LAMINECTOMY/DECOMPRESSION MICRODISCECTOMY N/A 01/26/2019   Procedure: LUMBAR LAMINECTOMY/DECOMPRESSION MICRODISCECTOMY 1 LEVEL L3-4;  Surgeon: Berta Brittle, MD;  Location: [facility];  Service: Neurosurgery;  Laterality: N/A;   NECK SURGERY     PILONIDAL CYST / SINUS EXCISION     Patient Active Problem List   Diagnosis Date Noted   Ambulatory dysfunction 10/22/2023   Prediabetes 02/05/2023   Anemia 08/01/2020   Elevated hemoglobin A1c 08/01/2020   Loss of memory 08/01/2020   Sesamoiditis 06/25/2019   S/P lumbar laminectomy 01/26/2019   Weakness of both lower limbs 10/24/2018   LVH (left ventricular hypertrophy) 08/29/2016   Allergy 02/20/2016   Amyloidosis (HCC) 02/20/2016   Thrombocytopenia (HCC) 11/23/2015   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   Airway hyperreactivity 05/07/2013   Benign fibroma of prostate 05/07/2013   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   Chronic diastolic heart failure (HCC) 11/18/2012   Proteinuria, Bence Jones 11/18/2012   Multiple allergies 10/29/2012   Hypertension 10/29/2012   RBBB (right bundle branch block with left anterior fascicular block) 10/29/2012   Proteinuria 10/29/2012    ONSET DATE: 04/22/24  REFERRING DIAG: R26.9 (ICD-10-CM) - Altered gait  THERAPY DIAG:  Difficulty in walking, not elsewhere classified  Other abnormalities of gait and mobility  Muscle weakness (generalized)  Unsteadiness on feet  Other lack of coordination  Rationale for Evaluation and Treatment: Rehabilitation  SUBJECTIVE:                                                                                                                                                                                             SUBJECTIVE STATEMENT:  Pt reports he is feeling good today and is in no pain. Has not had any falls since last visit. Reports that he has been intermittent with his HEP d/t his  grandchildren visiting.     From Eval:Pt wife report he has been inconsistent with exercise since discharge. She also assist with all of subjective. Pt has been wall and furniture surfing to get around home. Pt had one pseudo fall in last 6 months; he went on a 4's into the recycling can but could not get up. Pt has not been following trough with his exercise. Pt has been using the cane almost all of the time outside of the house in comparison to rollator.  Patient and caregiver want to work on general strength and mobility. Pt caregiver has some concerns with getting rollator into and out of the car.  As well as getting patient into and out of the car.  Pt accompanied by: significant other  PERTINENT HISTORY: . 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 No  PRECAUTIONS: Fall  RED FLAGS: None   WEIGHT BEARING RESTRICTIONS: No  FALLS: Has patient fallen in last 6 months? No fall but went to ground intentionally and could not get up   LIVING ENVIRONMENT: LIVING ENVIRONMENT: Lives with: lives with their spouse Lives in: House/apartment Stairs: 1 step into home Has following equipment at home: Retail banker - 2 wheeled *On Arts development officer for [employer] at [employer]  PLOF: Independent with household mobility with device and Independent with community mobility with device  PATIENT GOALS: Improve balance and mobility   OBJECTIVE:  Note: Objective measures were completed at Evaluation unless otherwise noted.   COGNITION: Overall cognitive status: Impaired and Mild cogintive impairment     POSTURE: rounded shoulders, forward head, and increased thoracic kyphosis  LOWER EXTREMITY ROM:   WNL for tasks assessed    LOWER EXTREMITY MMT:    MMT Right Eval Left Eval  Hip flexion 4 4  Hip extension    Hip abduction 4+ 4+  Hip  adduction 4+ 4+  Hip internal rotation    Hip external rotation    Knee flexion 4- 4-  Knee extension 4 4  Ankle dorsiflexion -   Ankle plantarflexion -   Ankle inversion    Ankle eversion    (Blank rows = not tested)  BED MOBILITY:  Not tested  TRANSFERS: Sit to stand: Complete Independence and on a few reps noted instability in standing  Assistive device utilized: None     Stand to sit: Complete Independence  Assistive device utilized: None      GAIT: Findings: Gait Characteristics: With cane pt does not use in classic 2 or 3 point pattern but uses sporadically, decreased step length- Right, and decreased step length- Left, Distance walked: 30 ft, Assistive device utilized:Single point cane, Level of assistance: CGA, and Comments: Describing gait with SPC in R UE   FUNCTIONAL TESTS:  5 times sit to stand: 15.55 sec, imbalance on a few Timed up and go (TUG): 22.62 sec with cane  6 minute walk test: Test visit 2  10 meter walk test: 22.67 sec with cane, 13.6 sec with rollator   Berg Balance Scale: Test visit 2    PATIENT SURVEYS:  LEFS  Extreme difficulty/unable (0), Quite a bit of difficulty (1), Moderate difficulty (2), Little difficulty (3), No difficulty (4) Survey date:  WIFE FILLS OUT 05/12/24  Any of your usual work, housework or school activities 3  2. Usual hobbies, recreational or sporting activities 2  3. Getting into/out of the bath 4  4. Walking between rooms 3  5. Putting on socks/shoes 2  6. Squatting  1  7. Lifting an object, like a bag of groceries from the floor 3  8. Performing light activities around your home 3  9. Performing heavy activities around your home 2  10. Getting into/out of a car 1  11. Walking 2 blocks 2  12. Walking 1 mile 1  13. Going up/down 10 stairs (1 flight) 1  14. Standing for 1 hour 2  15.  sitting for 1 hour 4  16. Running on even ground 1  17. Running on uneven ground 1  18. Making sharp turns while running fast 1  19.  Hopping  0  20. Rolling over in bed 2  Score total:  40                                                                                                                                 TREATMENT DATE: 05/21/24   Pt stated they had to leave by 4:50pm to get car from [place]. These times and treatments reflect that constraint.  TA- To improve functional movements patterns for everyday tasks   Standing Donkey kicks at bar 2x10 5# AW- Constant use of verbal, visual and tactile cueing to achieve proper technique. BUE support.   STS with 5KG medicine ball 3x10 - verbal cueing needed for glute  activation on stand and to keep ball close to chest   Lunges in // bars 3 laps (back and forth = 1 lap) - Visual and verbal cuing for big step length and lowering pt to ground. Pt was inconsistent with lunges and would perform them after each step 70% of the time.   TE- To improve strength, endurance, mobility, and function of specific targeted muscle groups or improve joint range of motion or improve muscle flexibility   HS flexion with BTB - 2x10 - Verbal cue to achieve max HS flexion during concentric phase.    Discussed possible strategies to assist in improving HEP compliance.   PATIENT EDUCATION: Education details: Pt educated throughout session about proper posture and technique with exercises. Improved exercise technique, movement at target joints, use of target muscles after min to mod verbal, visual, tactile cues. Person educated: Patient Education method: Explanation Education comprehension: verbalized understanding   HOME EXERCISE PROGRAM: Verbally added mini squat at bar to replace STS. No new handout provided, has handout from previous PT  Verbally added HS flexion with BTB to HEP. No new handout provided, has handout from previous PT    GOALS: Goals reviewed with patient? Yes  SHORT TERM GOALS: Target date: 06/09/2024       Patient will be independent in home exercise program to  improve strength/mobility for better functional independence with ADLs. Baseline: No HEP currently  Goal status: INITIAL  LONG TERM GOALS: Target date: 08/04/2024  1.  Patient will complete five times sit to stand test in < 15 seconds without LOB indicating an increased LE strength and improved balance. Baseline: 15.55 sec, imbalance on a few reps requiring CGA for safety Goal status: INITIAL  2.  Patient will improve LEFS score to 50   to demonstrate statistically significant improvement in mobility and quality of life as it relates to their LE strength and mobility.  Baseline: 40 Goal status: INITIAL   3.  Patient will increase Berg Balance score by > 6 points to demonstrate decreased fall risk during functional activities. Baseline: 38 Goal status: INITIAL   4.   Patient will reduce timed up and go to <11 seconds to reduce fall risk and demonstrate improved transfer/gait ability. Baseline: 22.62 sec with SPC  Goal status: INITIAL  5.   Patient will increase 10 meter walk test to >1.22m/s as to improve gait speed for better community ambulation and to reduce fall risk. Baseline: .44 m/s w/ SPC, .73 m/s with rollator Goal status: INITIAL  6.   Patient will increase six minute walk test distance by 150 ft or greater for progression to community ambulator and improve gait ability Baseline: 750 ft, increased shuffling style gait from minute 2 and on as well as increased trunk flexion from this point on as well.  Goal status: INITIAL      ASSESSMENT:  CLINICAL IMPRESSION: Pt presents to clinic in good spirits motivated for PT activities. Pt wife reports inconsistency with HEP. Pt reports it has been difficult to find the time for HEP with grandchildren visiting. Pt demos good quad and hip flexor strength as evidenced by Lunges in parallel bars and weighted sit to stands, but required constant verbal and tactile cues for form and technique. Pt struggles with complex movements, and  requires extra time and cueing to consistently perform an intervention d/t limited carryover from cueing and instructions. Pt will benefit from simple movements or tasks that target hip extensors/flexors and dorsiflexors to aid in foot clearance during  gait and reduce tendency to shuffle during ambulation.   OBJECTIVE IMPAIRMENTS: Abnormal gait, decreased activity tolerance, decreased balance, decreased endurance, decreased knowledge of condition, decreased knowledge of use of DME, and decreased strength.   ACTIVITY LIMITATIONS: standing, squatting, stairs, transfers, and locomotion level  PARTICIPATION LIMITATIONS: meal prep, shopping, and community activity  PERSONAL FACTORS: Age, Behavior pattern, and 3+ comorbidities: HTN, Chronic Diastolic Heart Failure, post polio,  are also affecting patient's functional outcome.   REHAB POTENTIAL: Good  CLINICAL DECISION MAKING: Evolving/moderate complexity  EVALUATION COMPLEXITY: Moderate  PLAN:  PT FREQUENCY: 2x/week  PT DURATION: 12 weeks  PLANNED INTERVENTIONS: 97750- Physical Performance Testing, 97110-Therapeutic exercises, 97530- Therapeutic activity, [id number]- Neuromuscular re-education, 97535- Self Care, 16109- Manual therapy, and 97116- Gait training  PLAN FOR NEXT SESSION: Progress Hip flexor intervention, consider leg press for strengthening, target hip abductors and extensors.    [place], SPT  [place]. Fairly IV, PT, DPT Physical Therapist- [place]  [facility] 05/21/2024, 8:16 AM

## 2024-05-20 NOTE — Progress Notes (Signed)
 He presents today for chief complaint of painful toenails and calluses bilaterally.  Objective: Pulses are palpable.  No calluses visualized.  Dropfoot right side toenails are long thick yellow dystrophic with mycotic.  Benign skin lesion none ulcer to the plantar aspect of the foot bilaterally  Assessment: Pain and secondary to onychomycosis.  Plan: Debridement of toenails 1 through 5 bilateral.  Also debrided benign skin lesions.

## 2024-05-21 ENCOUNTER — Encounter: Payer: PPO | Admitting: Speech Pathology

## 2024-05-21 ENCOUNTER — Ambulatory Visit: Payer: PPO | Admitting: Physical Therapy

## 2024-05-25 ENCOUNTER — Encounter: Payer: PPO | Admitting: Speech Pathology

## 2024-05-25 ENCOUNTER — Encounter: Payer: Self-pay | Admitting: Physical Therapy

## 2024-05-25 ENCOUNTER — Ambulatory Visit: Admitting: Physical Therapy

## 2024-05-25 ENCOUNTER — Ambulatory Visit: Payer: PPO | Admitting: Physical Therapy

## 2024-05-25 DIAGNOSIS — R2681 Unsteadiness on feet: Secondary | ICD-10-CM

## 2024-05-25 DIAGNOSIS — R278 Other lack of coordination: Secondary | ICD-10-CM

## 2024-05-25 DIAGNOSIS — R262 Difficulty in walking, not elsewhere classified: Secondary | ICD-10-CM

## 2024-05-25 DIAGNOSIS — R2689 Other abnormalities of gait and mobility: Secondary | ICD-10-CM

## 2024-05-25 DIAGNOSIS — M6281 Muscle weakness (generalized): Secondary | ICD-10-CM

## 2024-05-25 NOTE — Therapy (Signed)
 OUTPATIENT PHYSICAL THERAPY NEURO TREATMENT   Patient Name: Eugene Martinez. MRN: [id number] DOB:Jul 01, 1946, 78 y.o., male Today's Date: 05/25/2024   PCP: Antonio Baumgarten, MD  REFERRING PROVIDER: Caffaro, Kaitlin T, PA-C   END OF SESSION:  PT End of Session - 05/25/24 1201     Visit Number 5    Number of Visits 25    Date for PT Re-Evaluation 08/04/24    Progress Note Due on Visit 10    PT Start Time 1020    PT Stop Time 1102    PT Time Calculation (min) 42 min    Equipment Utilized During Treatment Gait belt    Activity Tolerance Patient tolerated treatment well    Behavior During Therapy WFL for tasks assessed/performed              Past Medical History:  Diagnosis Date   Allergic state    Amyloidosis (HCC)    Anemia    BPH (benign prostatic hyperplasia)    DDD (degenerative disc disease), lumbar    Dysplastic nevus 10/13/2013   Left posterior waistline paraspinal. Moderate to severe atypia, pattern borders on early evolving MIS, edge involved. Excised 03/31/2014, margins free.   Dysplastic nevus 12/19/2017   Right upper back medial. Mild atypia, lateral and deep margins involved.    Dysplastic nevus 11/28/2022   right prox lat thigh, Severe atypia, Excised 01/08/23   Dysrhythmia    Environmental and seasonal allergies    Gallop rhythm    GERD (gastroesophageal reflux disease)    Hypertension    Mild mitral regurgitation    a. by echo 10/2012   Pain    bil. 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 10/2012 Echo: EF 55-60%, severe LVH with speckling of myocardium -? amyloid. Mild MR, mod dil LA.   Past Surgical History:  Procedure Laterality Date   CIRCUMCISION  1984   COLONOSCOPY  03/2012   COLONOSCOPY WITH PROPOFOL  N/A 10/29/2017   Procedure: COLONOSCOPY WITH PROPOFOL ;  Surgeon: Deveron Fly, MD;  Location: [facility] ENDOSCOPY;  Service: Endoscopy;  Laterality: N/A;   FOOT SURGERY     right foot   FOOT SURGERY Right 2017    LEG SURGERIES     HX.OF MULTIPLE SURGERIES FOR LEG LENGTH DUE TO POLIO   LUMBAR LAMINECTOMY/DECOMPRESSION MICRODISCECTOMY N/A 01/26/2019   Procedure: LUMBAR LAMINECTOMY/DECOMPRESSION MICRODISCECTOMY 1 LEVEL L3-4;  Surgeon: Berta Brittle, MD;  Location: [facility] ORS;  Service: Neurosurgery;  Laterality: N/A;   NECK SURGERY     PILONIDAL CYST / SINUS EXCISION     Patient Active Problem List   Diagnosis Date Noted   Ambulatory dysfunction 10/22/2023   Prediabetes 02/05/2023   Anemia 08/01/2020   Elevated hemoglobin A1c 08/01/2020   Loss of memory 08/01/2020   Sesamoiditis 06/25/2019   S/P lumbar laminectomy 01/26/2019   Weakness of both lower limbs 10/24/2018   LVH (left ventricular hypertrophy) 08/29/2016   Allergy 02/20/2016   Amyloidosis (HCC) 02/20/2016   Thrombocytopenia (HCC) 11/23/2015   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   Airway hyperreactivity 05/07/2013   Benign fibroma of prostate 05/07/2013   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   Chronic diastolic heart failure (HCC) 11/18/2012   Proteinuria, Bence Jones 11/18/2012   Multiple allergies 10/29/2012   Hypertension 10/29/2012   RBBB (right bundle branch block with left anterior fascicular block) 10/29/2012   Proteinuria 10/29/2012    ONSET DATE: 04/22/24  REFERRING DIAG: R26.9 (ICD-10-CM) - Altered  gait   THERAPY DIAG:  Difficulty in walking, not elsewhere classified  Muscle weakness (generalized)  Other lack of coordination  Other abnormalities of gait and mobility  Unsteadiness on feet  Rationale for Evaluation and Treatment: Rehabilitation  SUBJECTIVE:                                                                                                                                                                                             SUBJECTIVE STATEMENT:  Pt reports he is feeling good today and is in no pain. Has not had any falls since last visit. Reports that he has been getting better with HEP but is  still performing interventions intermittently. Pt states he wore 5# AW all day on Saturday, reports he had a great struggle with LAQ while weights were donned. Pt has exhibited no such struggle in clinic.     From Eval:Pt wife report he has been inconsistent with exercise since discharge. She also assist with all of subjective. Pt has been wall and furniture surfing to get around home. Pt had one pseudo fall in last 6 months; he went on a 4's into the recycling can but could not get up. Pt has not been following trough with his exercise. Pt has been using the cane almost all of the time outside of the house in comparison to rollator.  Patient and caregiver want to work on general strength and mobility. Pt caregiver has some concerns with getting rollator into and out of the car.  As well as getting patient into and out of the car.  Pt accompanied by: significant other  PERTINENT HISTORY: . 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 No  PRECAUTIONS: Fall  RED FLAGS: None   WEIGHT BEARING RESTRICTIONS: No  FALLS: Has patient fallen in last 6 months? No fall but went to ground intentionally and could not get up   LIVING ENVIRONMENT: LIVING ENVIRONMENT: Lives with: lives with their spouse Lives in: House/apartment Stairs: 1 step into home Has following equipment at home: Retail banker - 2 wheeled *On Arts development officer for  [employer]  PLOF: Independent with household mobility with device and Independent with community mobility with device  PATIENT GOALS: Improve balance and mobility   OBJECTIVE:  Note: Objective measures were completed at Evaluation unless otherwise noted.   COGNITION: Overall cognitive status: Impaired and Mild cogintive impairment     POSTURE: rounded shoulders, forward head, and increased thoracic  kyphosis  LOWER EXTREMITY ROM:  WNL for tasks assessed    LOWER EXTREMITY MMT:    MMT Right Eval Left Eval  Hip flexion 4 4  Hip extension    Hip abduction 4+ 4+  Hip adduction 4+ 4+  Hip internal rotation    Hip external rotation    Knee flexion 4- 4-  Knee extension 4 4  Ankle dorsiflexion -   Ankle plantarflexion -   Ankle inversion    Ankle eversion    (Blank rows = not tested)  BED MOBILITY:  Not tested  TRANSFERS: Sit to stand: Complete Independence and on a few reps noted instability in standing  Assistive device utilized: None     Stand to sit: Complete Independence  Assistive device utilized: None      GAIT: Findings: Gait Characteristics: With cane pt does not use in classic 2 or 3 point pattern but uses sporadically, decreased step length- Right, and decreased step length- Left, Distance walked: 30 ft, Assistive device utilized:Single point cane, Level of assistance: CGA, and Comments: Describing gait with SPC in R UE   FUNCTIONAL TESTS:  5 times sit to stand: 15.55 sec, imbalance on a few Timed up and go (TUG): 22.62 sec with cane  6 minute walk test: Test visit 2  10 meter walk test: 22.67 sec with cane, 13.6 sec with rollator   Berg Balance Scale: Test visit 2    PATIENT SURVEYS:  LEFS  Extreme difficulty/unable (0), Quite a bit of difficulty (1), Moderate difficulty (2), Little difficulty (3), No difficulty (4) Survey date:  WIFE FILLS OUT 05/12/24  Any of your usual work, housework or school activities 3  2. Usual hobbies, recreational or sporting activities 2  3. Getting into/out of the bath 4  4. Walking between rooms 3  5. Putting on socks/shoes 2  6. Squatting  1  7. Lifting an object, like a bag of groceries from the floor 3  8. Performing light activities around your home 3  9. Performing heavy activities around your home 2  10. Getting into/out of a car 1  11. Walking 2 blocks 2  12. Walking 1 mile 1  13. Going up/down 10 stairs (1  flight) 1  14. Standing for 1 hour 2  15.  sitting for 1 hour 4  16. Running on even ground 1  17. Running on uneven ground 1  18. Making sharp turns while running fast 1  19. Hopping  0  20. Rolling over in bed 2  Score total:  40                                                                                                                                 TREATMENT DATE: 05/25/24   Discussed recent verbal additions to HEP [employer] kicks), and educated patient on amount of time to wear ankle weights while at home. Reviewed STS in HEP.   TA- To improve functional movements patterns for  everyday tasks    STS with 5KG medicine ball 3x10 - verbal cueing needed for glute activation on stand and to keep ball close to chest pt tends to perform overhead press with 5KG ball.   Lunges in // bars 3 laps (back and forth = 1 lap) - Visual and verbal cuing for big step length and lowering pt to ground. Pt was inconsistent with lunges and would perform them after each step 70% of the time. Pt performs better with cueing at each step.  Side steps in // bars with RTB around distal femur w/ squats w/ each step. BUE support on rail but cued not to push down through hands too much. 3 laps (back and forth 1-2 mini-squats with each step). VC for wide step-lengths during squats   TE- To improve strength, endurance, mobility, and function of specific targeted muscle groups or improve joint range of motion or improve muscle flexibility   HS flexion with BTB - 2x10 - Verbal cue to achieve max HS flexion during concentric phase.    Discussed possible strategies to assist in improving HEP compliance.   Gait Training  5# AW around BLE. 450 feet around clinic using rollator. VC given to take a high step with his R foot to increase step length and reduce shuffle gait when fatigued. Noted less R foot drag this session.   PATIENT EDUCATION: Education details: Pt educated throughout session about proper posture and  technique with exercises. Improved exercise technique, movement at target joints, use of target muscles after min to mod verbal, visual, tactile cues. Person educated: Patient Education method: Explanation Education comprehension: verbalized understanding   HOME EXERCISE PROGRAM: Verbally added mini squat at bar to replace STS. No new handout provided, has handout from previous PT  Verbally added HS flexion with BTB to HEP. No new handout provided, has handout from previous PT    GOALS: Goals reviewed with patient? Yes  SHORT TERM GOALS: Target date: 06/09/2024       Patient will be independent in home exercise program to improve strength/mobility for better functional independence with ADLs. Baseline: No HEP currently  Goal status: INITIAL  LONG TERM GOALS: Target date: 08/04/2024  1.  Patient will complete five times sit to stand test in < 15 seconds without LOB indicating an increased LE strength and improved balance. Baseline: 15.55 sec, imbalance on a few reps requiring CGA for safety Goal status: INITIAL  2.  Patient will improve LEFS score to 50   to demonstrate statistically significant improvement in mobility and quality of life as it relates to their LE strength and mobility.  Baseline: 40 Goal status: INITIAL   3.  Patient will increase Berg Balance score by > 6 points to demonstrate decreased fall risk during functional activities. Baseline: 38 Goal status: INITIAL   4.   Patient will reduce timed up and go to <11 seconds to reduce fall risk and demonstrate improved transfer/gait ability. Baseline: 22.62 sec with SPC  Goal status: INITIAL  5.   Patient will increase 10 meter walk test to >1.56m/s as to improve gait speed for better community ambulation and to reduce fall risk. Baseline: .44 m/s w/ SPC, .73 m/s with rollator Goal status: INITIAL  6.   Patient will increase six minute walk test distance by 150 ft or greater for progression to community ambulator  and improve gait ability Baseline: 750 ft, increased shuffling style gait from minute 2 and on as well as increased trunk flexion from this  point on as well.  Goal status: INITIAL      ASSESSMENT:  CLINICAL IMPRESSION: Pt presents to clinic in good spirits motivated for PT activities. He reports he wore 5# AW all day Saturday, and that he thinks 5# may be too much as he was unable to perform LAQ while AW donned. PT reminded patient that in the clinic he had no struggle with 5# LAQ, and that he was unable to perform them likely d/t fatigue stemming from all day usage of AW. Pt instructed pt not to wear weights all day. Pt with noted decreased shuffling of R foot during prolonged bouts of gait, which reduces risk of falls. Pt would benefit from continued LE strengthening to further reduce shuffle gait and aid in foot clearance during endurance activities.     OBJECTIVE IMPAIRMENTS: Abnormal gait, decreased activity tolerance, decreased balance, decreased endurance, decreased knowledge of condition, decreased knowledge of use of DME, and decreased strength.   ACTIVITY LIMITATIONS: standing, squatting, stairs, transfers, and locomotion level  PARTICIPATION LIMITATIONS: meal prep, shopping, and community activity  PERSONAL FACTORS: Age, Behavior pattern, and 3+ comorbidities: HTN, Chronic Diastolic Heart Failure, post polio,  are also affecting patient's functional outcome.   REHAB POTENTIAL: Good  CLINICAL DECISION MAKING: Evolving/moderate complexity  EVALUATION COMPLEXITY: Moderate  PLAN:  PT FREQUENCY: 2x/week  PT DURATION: 12 weeks  PLANNED INTERVENTIONS: 97750- Physical Performance Testing, 97110-Therapeutic exercises, 97530- Therapeutic activity, [id number]- Neuromuscular re-education, 97535- Self Care, [id number]- Manual therapy, and [id number]- Gait training  PLAN FOR NEXT SESSION: Progress Hip flexor intervention, consider leg press for strengthening, target hip abductors and extensors.  Initiate static balance interventions reflective of BERG deficits    Barba Levin, SPT  Physical Therapist- [facility]  [facility] 05/25/2024, 3:52 PM

## 2024-05-28 ENCOUNTER — Ambulatory Visit: Payer: PPO | Admitting: Physical Therapy

## 2024-05-28 ENCOUNTER — Ambulatory Visit: Admitting: Physical Therapy

## 2024-05-28 ENCOUNTER — Encounter: Payer: PPO | Admitting: Speech Pathology

## 2024-05-28 ENCOUNTER — Encounter: Payer: Self-pay | Admitting: Physical Therapy

## 2024-05-28 DIAGNOSIS — R262 Difficulty in walking, not elsewhere classified: Secondary | ICD-10-CM

## 2024-05-28 DIAGNOSIS — R2681 Unsteadiness on feet: Secondary | ICD-10-CM

## 2024-05-28 DIAGNOSIS — R278 Other lack of coordination: Secondary | ICD-10-CM

## 2024-05-28 DIAGNOSIS — R2689 Other abnormalities of gait and mobility: Secondary | ICD-10-CM

## 2024-05-28 DIAGNOSIS — M6281 Muscle weakness (generalized): Secondary | ICD-10-CM

## 2024-05-28 NOTE — Therapy (Signed)
 OUTPATIENT PHYSICAL THERAPY NEURO TREATMENT   Patient Name: Eugene Martinez. MRN: [id number] DOB:14-Oct-1946, 78 y.o., male Today's Date: 05/28/2024   PCP: Antonio Baumgarten, MD  REFERRING PROVIDER: Caffaro, Kaitlin T, PA-C   END OF SESSION:  PT End of Session - 05/28/24 1149     Visit Number 6    Number of Visits 25    Date for PT Re-Evaluation 08/04/24    Progress Note Due on Visit 10    PT Start Time 1143    PT Stop Time 1227    PT Time Calculation (min) 44 min    Equipment Utilized During Treatment Gait belt    Activity Tolerance Patient tolerated treatment well    Behavior During Therapy WFL for tasks assessed/performed               Past Medical History:  Diagnosis Date   Allergic state    Amyloidosis (HCC)    Anemia    BPH (benign prostatic hyperplasia)    DDD (degenerative disc disease), lumbar    Dysplastic nevus 10/13/2013   Left posterior waistline paraspinal. Moderate to severe atypia, pattern borders on early evolving MIS, edge involved. Excised 03/31/2014, margins free.   Dysplastic nevus 12/19/2017   Right upper back medial. Mild atypia, lateral and deep margins involved.    Dysplastic nevus 11/28/2022   right prox lat thigh, Severe atypia, Excised 01/08/23   Dysrhythmia    Environmental and seasonal allergies    Gallop rhythm    GERD (gastroesophageal reflux disease)    Hypertension    Mild mitral regurgitation    a. by echo 10/2012   Pain    bil. 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 10/2012 Echo: EF 55-60%, severe LVH with speckling of myocardium -? amyloid. Mild MR, mod dil LA.   Past Surgical History:  Procedure Laterality Date   CIRCUMCISION  1984   COLONOSCOPY  03/2012   COLONOSCOPY WITH PROPOFOL  N/A 10/29/2017   Procedure: COLONOSCOPY WITH PROPOFOL ;  Surgeon: Deveron Fly, MD;  Location[facility] ENDOSCOPY;  Service: Endoscopy;  Laterality: N/A;   FOOT SURGERY     right foot   FOOT SURGERY Right 2017    LEG SURGERIES     HX.OF MULTIPLE SURGERIES FOR LEG LENGTH DUE TO POLIO   LUMBAR LAMINECTOMY/DECOMPRESSION MICRODISCECTOMY N/A 01/26/2019   Procedure: LUMBAR LAMINECTOMY/DECOMPRESSION MICRODISCECTOMY 1 LEVEL L3-4;  Surgeon: Berta Brittle, MD;  Location: [facility] ORS;  Service: Neurosurgery;  Laterality: N/A;   NECK SURGERY     PILONIDAL CYST / SINUS EXCISION     Patient Active Problem List   Diagnosis Date Noted   Ambulatory dysfunction 10/22/2023   Prediabetes 02/05/2023   Anemia 08/01/2020   Elevated hemoglobin A1c 08/01/2020   Loss of memory 08/01/2020   Sesamoiditis 06/25/2019   S/P lumbar laminectomy 01/26/2019   Weakness of both lower limbs 10/24/2018   LVH (left ventricular hypertrophy) 08/29/2016   Allergy 02/20/2016   Amyloidosis (HCC) 02/20/2016   Thrombocytopenia (HCC) 11/23/2015   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   Airway hyperreactivity 05/07/2013   Benign fibroma of prostate 05/07/2013   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   Chronic diastolic heart failure (HCC) 11/18/2012   Proteinuria, Bence Jones 11/18/2012   Multiple allergies 10/29/2012   Hypertension 10/29/2012   RBBB (right bundle branch block with left anterior fascicular block) 10/29/2012   Proteinuria 10/29/2012    ONSET DATE: 04/22/24  REFERRING DIAG: R26.9 (ICD-10-CM) -  Altered gait   THERAPY DIAG:  Difficulty in walking, not elsewhere classified  Muscle weakness (generalized)  Other lack of coordination  Other abnormalities of gait and mobility  Unsteadiness on feet  Rationale for Evaluation and Treatment: Rehabilitation  SUBJECTIVE:                                                                                                                                                                                             SUBJECTIVE STATEMENT:  Pt reports he is feeling good today and is in no pain. Has not had any falls since last visit.    From Eval:Pt wife report he has been inconsistent  with exercise since discharge. She also assist with all of subjective. Pt has been wall and furniture surfing to get around home. Pt had one pseudo fall in last 6 months; he went on a 4's into the recycling can but could not get up. Pt has not been following trough with his exercise. Pt has been using the cane almost all of the time outside of the house in comparison to rollator.  Patient and caregiver want to work on general strength and mobility. Pt caregiver has some concerns with getting rollator into and out of the car.  As well as getting patient into and out of the car.  Pt accompanied by: significant other  PERTINENT HISTORY: . 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 No  PRECAUTIONS: Fall  RED FLAGS: None   WEIGHT BEARING RESTRICTIONS: No  FALLS: Has patient fallen in last 6 months? No fall but went to ground intentionally and could not get up   LIVING ENVIRONMENT: LIVING ENVIRONMENT: Lives with: lives with their spouse Lives in: House/apartment Stairs: 1 step into home Has following equipment at home: Retail banker - 2 wheeled *On Arts development officer for [employer] at [employer]  PLOF: Independent with household mobility with device and Independent with community mobility with device  PATIENT GOALS: Improve balance and mobility   OBJECTIVE:  Note: Objective measures were completed at Evaluation unless otherwise noted.   COGNITION: Overall cognitive status: Impaired and Mild cogintive impairment     POSTURE: rounded shoulders, forward head, and increased thoracic kyphosis  LOWER EXTREMITY ROM:   WNL for tasks assessed    LOWER EXTREMITY MMT:    MMT Right Eval Left Eval  Hip flexion 4 4  Hip extension    Hip abduction 4+ 4+  Hip adduction 4+ 4+  Hip internal rotation  Hip external rotation    Knee flexion 4-  4-  Knee extension 4 4  Ankle dorsiflexion -   Ankle plantarflexion -   Ankle inversion    Ankle eversion    (Blank rows = not tested)  BED MOBILITY:  Not tested  TRANSFERS: Sit to stand: Complete Independence and on a few reps noted instability in standing  Assistive device utilized: None     Stand to sit: Complete Independence  Assistive device utilized: None      GAIT: Findings: Gait Characteristics: With cane pt does not use in classic 2 or 3 point pattern but uses sporadically, decreased step length- Right, and decreased step length- Left, Distance walked: 30 ft, Assistive device utilized:Single point cane, Level of assistance: CGA, and Comments: Describing gait with SPC in R UE   FUNCTIONAL TESTS:  5 times sit to stand: 15.55 sec, imbalance on a few Timed up and go (TUG): 22.62 sec with cane  6 minute walk test: Test visit 2  10 meter walk test: 22.67 sec with cane, 13.6 sec with rollator   Berg Balance Scale: Test visit 2    PATIENT SURVEYS:  LEFS  Extreme difficulty/unable (0), Quite a bit of difficulty (1), Moderate difficulty (2), Little difficulty (3), No difficulty (4) Survey date:  WIFE FILLS OUT 05/12/24  Any of your usual work, housework or school activities 3  2. Usual hobbies, recreational or sporting activities 2  3. Getting into/out of the bath 4  4. Walking between rooms 3  5. Putting on socks/shoes 2  6. Squatting  1  7. Lifting an object, like a bag of groceries from the floor 3  8. Performing light activities around your home 3  9. Performing heavy activities around your home 2  10. Getting into/out of a car 1  11. Walking 2 blocks 2  12. Walking 1 mile 1  13. Going up/down 10 stairs (1 flight) 1  14. Standing for 1 hour 2  15.  sitting for 1 hour 4  16. Running on even ground 1  17. Running on uneven ground 1  18. Making sharp turns while running fast 1  19. Hopping  0  20. Rolling over in bed 2  Score total:  40                                                                                                                                  TREATMENT DATE: 05/28/24    TA- To improve functional movements patterns for everyday tasks    STS with 5KG medicine ball 3x10 - verbal cueing needed for glute activation on stand and to keep ball close to chest pt tends to perform overhead press with 5KG ball.   Lunges in // bars 3 laps (back and forth = 1 lap) - Visual and verbal cuing for big step length and lowering pt to ground. Pt was inconsistent with lunges and would  perform them after each step 70% of the time. Pt performs better with cueing at each step. 4 Half foam hurdles placed in // bars to provide external cue to take big step before lunge, also help aid foot clearance during gait.    Side steps in // bars with RTB around distal femur w/ squats w/ each step. BUE support on rail but cued not to push down through hands too much. 3 laps (back and forth 1-2 mini-squats with each step). VC for wide step-lengths during squats  - Visibly fatigued after 2 laps, required seated rest break which he usually does not.  TE- To improve strength, endurance, mobility, and function of specific targeted muscle groups or improve joint range of motion or improve muscle flexibility     Leg Press 2x10 - 25# VC for slow eccentric control and not to lock legs on extension. 2 min rest between sets.  PATIENT EDUCATION: Education details: Pt educated throughout session about proper posture and technique with exercises. Improved exercise technique, movement at target joints, use of target muscles after min to mod verbal, visual, tactile cues. Person educated: Patient Education method: Explanation Education comprehension: verbalized understanding   HOME EXERCISE PROGRAM: Verbally added mini squat at bar to replace STS. No new handout provided, has handout from previous PT  Verbally added HS flexion with BTB to HEP. No new handout provided, has handout from  previous PT    GOALS: Goals reviewed with patient? Yes  SHORT TERM GOALS: Target date: 06/09/2024       Patient will be independent in home exercise program to improve strength/mobility for better functional independence with ADLs. Baseline: No HEP currently  Goal status: INITIAL  LONG TERM GOALS: Target date: 08/04/2024  1.  Patient will complete five times sit to stand test in < 15 seconds without LOB indicating an increased LE strength and improved balance. Baseline: 15.55 sec, imbalance on a few reps requiring CGA for safety Goal status: INITIAL  2.  Patient will improve LEFS score to 50   to demonstrate statistically significant improvement in mobility and quality of life as it relates to their LE strength and mobility.  Baseline: 40 Goal status: INITIAL   3.  Patient will increase Berg Balance score by > 6 points to demonstrate decreased fall risk during functional activities. Baseline: 38 Goal status: INITIAL   4.   Patient will reduce timed up and go to <11 seconds to reduce fall risk and demonstrate improved transfer/gait ability. Baseline: 22.62 sec with SPC  Goal status: INITIAL  5.   Patient will increase 10 meter walk test to >1.63m/s as to improve gait speed for better community ambulation and to reduce fall risk. Baseline: .44 m/s w/ SPC, .73 m/s with rollator Goal status: INITIAL  6.   Patient will increase six minute walk test distance by 150 ft or greater for progression to community ambulator and improve gait ability Baseline: 750 ft, increased shuffling style gait from minute 2 and on as well as increased trunk flexion from this point on as well.  Goal status: INITIAL      ASSESSMENT:  CLINICAL IMPRESSION: Pt presents to clinic in good spirits and motivated for PT. In the lobby pt was seated in chair with his feet propped up on his rollator which is unusual for him. Patient was pleasant and agreeable but a bit less conversant that usual. He required  multiple seated rest breaks today (x4) which is unusual for him considering there was no  progression of his exercises today. Continues to demo great strength AEB lunges and // bar side stepping. Pt will continue to benefit from skilled physical therapy intervention to address impairments, improve QOL, and attain therapy goals.     OBJECTIVE IMPAIRMENTS: Abnormal gait, decreased activity tolerance, decreased balance, decreased endurance, decreased knowledge of condition, decreased knowledge of use of DME, and decreased strength.   ACTIVITY LIMITATIONS: standing, squatting, stairs, transfers, and locomotion level  PARTICIPATION LIMITATIONS: meal prep, shopping, and community activity  PERSONAL FACTORS: Age, Behavior pattern, and 3+ comorbidities: HTN, Chronic Diastolic Heart Failure, post polio,  are also affecting patient's functional outcome.   REHAB POTENTIAL: Good  CLINICAL DECISION MAKING: Evolving/moderate complexity  EVALUATION COMPLEXITY: Moderate  PLAN:  PT FREQUENCY: 2x/week  PT DURATION: 12 weeks  PLANNED INTERVENTIONS: 97750- Physical Performance Testing, 97110-Therapeutic exercises, 97530- Therapeutic activity, [id number]- Neuromuscular re-education, 97535- Self Care, 95621- Manual therapy, and [id number]- Gait training  PLAN FOR NEXT SESSION: Progress Hip flexor intervention, endurance and extensors. Initiate static balance interventions reflective of BERG deficits    Barba Levin, SPT  Physical Therapist- [facility]  [facility] 05/28/2024, 11:50 AM

## 2024-06-01 ENCOUNTER — Encounter: Payer: PPO | Admitting: Speech Pathology

## 2024-06-01 ENCOUNTER — Ambulatory Visit: Admitting: Physical Therapy

## 2024-06-01 ENCOUNTER — Ambulatory Visit: Payer: PPO | Admitting: Physical Therapy

## 2024-06-01 DIAGNOSIS — R262 Difficulty in walking, not elsewhere classified: Secondary | ICD-10-CM

## 2024-06-01 DIAGNOSIS — R2681 Unsteadiness on feet: Secondary | ICD-10-CM

## 2024-06-01 DIAGNOSIS — M6281 Muscle weakness (generalized): Secondary | ICD-10-CM

## 2024-06-01 DIAGNOSIS — R278 Other lack of coordination: Secondary | ICD-10-CM

## 2024-06-01 DIAGNOSIS — R2689 Other abnormalities of gait and mobility: Secondary | ICD-10-CM

## 2024-06-01 NOTE — Therapy (Signed)
 OUTPATIENT PHYSICAL THERAPY NEURO TREATMENT   Patient Name: Eugene Martinez. MRN: [id number] DOB:06-20-1946, 78 y.o., male Today's Date: 06/01/2024   PCP: Sadie Manna, MD  REFERRING PROVIDER: Caffaro, Kaitlin T, PA-C   END OF SESSION:  PT End of Session - 06/01/24 1158     Visit Number 7    Number of Visits 25    Date for PT Re-Evaluation 08/04/24    Progress Note Due on Visit 10    PT Start Time 1152    PT Stop Time 1230    PT Time Calculation (min) 38 min    Equipment Utilized During Treatment Gait belt    Activity Tolerance Patient tolerated treatment well    Behavior During Therapy WFL for tasks assessed/performed               Past Medical History:  Diagnosis Date   Allergic state    Amyloidosis (HCC)    Anemia    BPH (benign prostatic hyperplasia)    DDD (degenerative disc disease), lumbar    Dysplastic nevus 10/13/2013   Left posterior waistline paraspinal. Moderate to severe atypia, pattern borders on early evolving MIS, edge involved. Excised 03/31/2014, margins free.   Dysplastic nevus 12/19/2017   Right upper back medial. Mild atypia, lateral and deep margins involved.    Dysplastic nevus 11/28/2022   right prox lat thigh, Severe atypia, Excised 01/08/23   Dysrhythmia    Environmental and seasonal allergies    Gallop rhythm    GERD (gastroesophageal reflux disease)    Hypertension    Mild mitral regurgitation    a. by echo 10/2012   Pain    bil. 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 10/2012 Echo: EF 55-60%, severe LVH with speckling of myocardium -? amyloid. Mild MR, mod dil LA.   Past Surgical History:  Procedure Laterality Date   CIRCUMCISION  1984   COLONOSCOPY  03/2012   COLONOSCOPY WITH PROPOFOL  N/A 10/29/2017   Procedure: COLONOSCOPY WITH PROPOFOL ;  Surgeon: Gaylyn Gladis PENNER, MD;  Location: [facility] ENDOSCOPY;  Service: Endoscopy;  Laterality: N/A;   FOOT SURGERY     right foot   FOOT SURGERY Right 2017    LEG SURGERIES     HX.OF MULTIPLE SURGERIES FOR LEG LENGTH DUE TO POLIO   LUMBAR LAMINECTOMY/DECOMPRESSION MICRODISCECTOMY N/A 01/26/2019   Procedure: LUMBAR LAMINECTOMY/DECOMPRESSION MICRODISCECTOMY 1 LEVEL L3-4;  Surgeon: Bluford Standing, MD;  Location: [facility] ORS;  Service: Neurosurgery;  Laterality: N/A;   NECK SURGERY     PILONIDAL CYST / SINUS EXCISION     Patient Active Problem List   Diagnosis Date Noted   Ambulatory dysfunction 10/22/2023   Prediabetes 02/05/2023   Anemia 08/01/2020   Elevated hemoglobin A1c 08/01/2020   Loss of memory 08/01/2020   Sesamoiditis 06/25/2019   S/P lumbar laminectomy 01/26/2019   Weakness of both lower limbs 10/24/2018   LVH (left ventricular hypertrophy) 08/29/2016   Allergy 02/20/2016   Amyloidosis (HCC) 02/20/2016   Thrombocytopenia (HCC) 11/23/2015   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   Airway hyperreactivity 05/07/2013   Benign fibroma of prostate 05/07/2013   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   Chronic diastolic heart failure (HCC) 11/18/2012   Proteinuria, Bence Jones 11/18/2012   Multiple allergies 10/29/2012   Hypertension 10/29/2012   RBBB (right bundle branch block with left anterior fascicular block) 10/29/2012   Proteinuria 10/29/2012    ONSET DATE: 04/22/24  REFERRING DIAG: R26.9 (ICD-10-CM) -  Altered gait   THERAPY DIAG:  Difficulty in walking, not elsewhere classified  Muscle weakness (generalized)  Other lack of coordination  Other abnormalities of gait and mobility  Unsteadiness on feet  Rationale for Evaluation and Treatment: Rehabilitation  SUBJECTIVE:                                                                                                                                                                                             SUBJECTIVE STATEMENT:  Pt reports he is feeling good today and is in no pain. Has not had any falls since last visit. His wife encouraged him to do some exercise since last  session.    From Eval:Pt wife report he has been inconsistent with exercise since discharge. She also assist with all of subjective. Pt has been wall and furniture surfing to get around home. Pt had one pseudo fall in last 6 months; he went on a 4's into the recycling can but could not get up. Pt has not been following trough with his exercise. Pt has been using the cane almost all of the time outside of the house in comparison to rollator.  Patient and caregiver want to work on general strength and mobility. Pt caregiver has some concerns with getting rollator into and out of the car.  As well as getting patient into and out of the car.  Pt accompanied by: significant other  PERTINENT HISTORY: . 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 No  PRECAUTIONS: Fall  RED FLAGS: None   WEIGHT BEARING RESTRICTIONS: No  FALLS: Has patient fallen in last 6 months? 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   COGNITION: Overall cognitive status: Impaired and Mild cogintive impairment     POSTURE: rounded shoulders, forward head, and increased thoracic kyphosis  LOWER EXTREMITY ROM:   WNL for tasks assessed    LOWER EXTREMITY MMT:    MMT Right Eval Left Eval  Hip flexion 4 4  Hip extension    Hip abduction 4+ 4+  Hip adduction 4+ 4+  Hip  internal rotation    Hip external rotation    Knee flexion 4- 4-  Knee extension 4 4  Ankle dorsiflexion -   Ankle plantarflexion -   Ankle inversion    Ankle eversion    (Blank rows = not tested)  BED MOBILITY:  Not tested  TRANSFERS: Sit to stand: Complete Independence and on a few reps noted instability in standing  Assistive device utilized: None     Stand to sit: Complete Independence  Assistive device utilized: None      GAIT: Findings: Gait Characteristics: With cane pt does not use in classic 2 or 3 point pattern but uses sporadically, decreased step length- Right, and decreased step length- Left, Distance walked: 30 ft, Assistive device utilized:Single point cane, Level of assistance: CGA, and Comments: Describing gait with SPC in R UE   FUNCTIONAL TESTS:  5 times sit to stand: 15.55 sec, imbalance on a few Timed up and go (TUG): 22.62 sec with cane  6 minute walk test: Test visit 2  10 meter walk test: 22.67 sec with cane, 13.6 sec with rollator   Berg Balance Scale: Test visit 2    PATIENT SURVEYS:  LEFS  Extreme difficulty/unable (0), Quite a bit of difficulty (1), Moderate difficulty (2), Little difficulty (3), No difficulty (4) Survey date:  WIFE FILLS OUT 05/12/24  Any of your usual work, housework or school activities 3  2. Usual hobbies, recreational or sporting activities 2  3. Getting into/out of the bath 4  4. Walking between rooms 3  5. Putting on socks/shoes 2  6. Squatting  1  7. Lifting an object, like a bag of groceries from the floor 3  8. Performing light activities around your home 3  9. Performing heavy activities around your home 2  10. Getting into/out of a car 1  11. Walking 2 blocks 2  12. Walking 1 mile 1  13. Going up/down 10 stairs (1 flight) 1  14. Standing for 1 hour 2  15.  sitting for 1 hour 4  16. Running on even ground 1  17. Running on uneven ground 1  18. Making sharp turns while running fast 1  19. Hopping  0  20. Rolling  over in bed 2  Score total:  40                                                                                                                                 TREATMENT DATE: 06/01/24    TA- To improve functional movements patterns for everyday tasks   STS with 5KG medicine ball 3x10 - verbal cueing needed for glute activation on stand and to keep ball close to chest pt tends to perform overhead press with 5KG ball.   Side steps in // bars with BTB around proimak femur w/ squats w/ each step. BUE support on rail but cued not to  push down through hands too much. 3 laps (back and forth 1-2 mini-squats with each step). VC for wide step-lengths during squats  and to squat in wider stance compared to narrow stance  -fatigue noted following although pt reports he is not tired.   TE- To improve strength, endurance, mobility, and function of specific targeted muscle groups or improve joint range of motion or improve muscle flexibility     Sing leg Leg Press 3x10 ea LE - 25# VC for slow eccentric control and not to lock legs on extension.   -tried double leg press but pt using LLE as primary mover so transitioned to single leg to ensure adequate RLE strengthening   NMR: To facilitate reeducation of movement, balance, posture, coordination, and/or proprioception/kinesthetic sense.  Balance obstacle course with 90 degree turns and weaving between cones for practice with his 4 WW and 5# AW donned x 4-5 min   Unless otherwise stated, CGA was provided and gait belt donned in order to ensure pt safety   PATIENT EDUCATION: Education details: Pt educated throughout session about proper posture and technique with exercises. Improved exercise technique, movement at target joints, use of target muscles after min to mod verbal, visual, tactile cues. Person educated: Patient Education method: Explanation Education comprehension: verbalized understanding   HOME EXERCISE PROGRAM: Verbally added mini  squat at bar to replace STS. No new handout provided, has handout from previous PT  Verbally added HS flexion with BTB to HEP. No new handout provided, has handout from previous PT    GOALS: Goals reviewed with patient? Yes  SHORT TERM GOALS: Target date: 06/09/2024       Patient will be independent in home exercise program to improve strength/mobility for better functional independence with ADLs. Baseline: No HEP currently  Goal status: INITIAL  LONG TERM GOALS: Target date: 08/04/2024  1.  Patient will complete five times sit to stand test in < 15 seconds without LOB indicating an increased LE strength and improved balance. Baseline: 15.55 sec, imbalance on a few reps requiring CGA for safety Goal status: INITIAL  2.  Patient will improve LEFS score to 50   to demonstrate statistically significant improvement in mobility and quality of life as it relates to their LE strength and mobility.  Baseline: 40 Goal status: INITIAL   3.  Patient will increase Berg Balance score by > 6 points to demonstrate decreased fall risk during functional activities. Baseline: 38 Goal status: INITIAL   4.   Patient will reduce timed up and go to <11 seconds to reduce fall risk and demonstrate improved transfer/gait ability. Baseline: 22.62 sec with SPC  Goal status: INITIAL  5.   Patient will increase 10 meter walk test to >1.36m/s as to improve gait speed for better community ambulation and to reduce fall risk. Baseline: .44 m/s w/ SPC, .73 m/s with rollator Goal status: INITIAL  6.   Patient will increase six minute walk test distance by 150 ft or greater for progression to community ambulator and improve gait ability Baseline: 750 ft, increased shuffling style gait from minute 2 and on as well as increased trunk flexion from this point on as well.  Goal status: INITIAL      ASSESSMENT:  CLINICAL IMPRESSION: Pt presents to clinic in good spirits and motivated for PT. Pt continues with  LE strength and power training with functional movement patterns to promote improved physical function and improved stability. Pt challenged with resisted gait with walker with obstacle course with  no over LOB but had continued shortened step length. Will continue to progress in future sessions as pt is able. Pt will continue to benefit from skilled physical therapy intervention to address impairments, improve QOL, and attain therapy goals.     OBJECTIVE IMPAIRMENTS: Abnormal gait, decreased activity tolerance, decreased balance, decreased endurance, decreased knowledge of condition, decreased knowledge of use of DME, and decreased strength.   ACTIVITY LIMITATIONS: standing, squatting, stairs, transfers, and locomotion level  PARTICIPATION LIMITATIONS: meal prep, shopping, and community activity  PERSONAL FACTORS: Age, Behavior pattern, and 3+ comorbidities: HTN, Chronic Diastolic Heart Failure, post polio,  are also affecting patient's functional outcome.   REHAB POTENTIAL: Good  CLINICAL DECISION MAKING: Evolving/moderate complexity  EVALUATION COMPLEXITY: Moderate  PLAN:  PT FREQUENCY: 2x/week  PT DURATION: 12 weeks  PLANNED INTERVENTIONS: 97750- Physical Performance Testing, 97110-Therapeutic exercises, 97530- Therapeutic activity, [id number]- Neuromuscular re-education, 97535- Self Care, [id number]- Manual therapy, and [id number]- Gait training  PLAN FOR NEXT SESSION: Progress Hip flexor intervention, endurance and extensors. Initiate static balance interventions reflective of BERG deficits    Note: Portions of this document were prepared using Dragon voice recognition software and although reviewed may contain unintentional dictation errors in syntax, grammar, or spelling.  Lonni KATHEE Gainer PT ,DPT Physical Therapist- [place]  [facility]    Physical Therapist- [facility]  [facility] 06/01/2024, 11:59 AM

## 2024-06-04 ENCOUNTER — Ambulatory Visit: Admitting: Physical Therapy

## 2024-06-04 ENCOUNTER — Encounter: Payer: PPO | Admitting: Speech Pathology

## 2024-06-04 ENCOUNTER — Ambulatory Visit: Payer: PPO | Admitting: Physical Therapy

## 2024-06-04 DIAGNOSIS — R278 Other lack of coordination: Secondary | ICD-10-CM

## 2024-06-04 DIAGNOSIS — R262 Difficulty in walking, not elsewhere classified: Secondary | ICD-10-CM

## 2024-06-04 DIAGNOSIS — R2689 Other abnormalities of gait and mobility: Secondary | ICD-10-CM

## 2024-06-04 DIAGNOSIS — M6281 Muscle weakness (generalized): Secondary | ICD-10-CM

## 2024-06-04 DIAGNOSIS — R2681 Unsteadiness on feet: Secondary | ICD-10-CM

## 2024-06-04 NOTE — Therapy (Signed)
 OUTPATIENT PHYSICAL THERAPY NEURO TREATMENT   Patient Name: Eugene Martinez. MRN: [id number] DOB:[birth year], 78 y.o., male Today's Date: 06/04/2024   PCP: Sadie Manna, MD  REFERRING PROVIDER: Caffaro, Kaitlin T, PA-C   END OF SESSION:  PT End of Session - 06/04/24 1403     Visit Number 8    Number of Visits 25    Date for PT Re-Evaluation 08/04/24    Progress Note Due on Visit 10    PT Start Time 1405    PT Stop Time 1445    PT Time Calculation (min) 40 min    Equipment Utilized During Treatment Gait belt    Activity Tolerance Patient tolerated treatment well    Behavior During Therapy WFL for tasks assessed/performed               Past Medical History:  Diagnosis Date   Allergic state    Amyloidosis (HCC)    Anemia    BPH (benign prostatic hyperplasia)    DDD (degenerative disc disease), lumbar    Dysplastic nevus 10/13/2013   Left posterior waistline paraspinal. Moderate to severe atypia, pattern borders on early evolving MIS, edge involved. Excised 03/31/2014, margins free.   Dysplastic nevus 12/19/2017   Right upper back medial. Mild atypia, lateral and deep margins involved.    Dysplastic nevus 11/28/2022   right prox lat thigh, Severe atypia, Excised 01/08/23   Dysrhythmia    Environmental and seasonal allergies    Gallop rhythm    GERD (gastroesophageal reflux disease)    Hypertension    Mild mitral regurgitation    a. by echo 10/2012   Pain    bil. 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 10/2012 Echo: EF 55-60%, severe LVH with speckling of myocardium -? amyloid. Mild MR, mod dil LA.   Past Surgical History:  Procedure Laterality Date   CIRCUMCISION  1984   COLONOSCOPY  03/2012   COLONOSCOPY WITH PROPOFOL  N/A 10/29/2017   Procedure: COLONOSCOPY WITH PROPOFOL ;  Surgeon: Gaylyn Gladis PENNER, MD;  Location: [facility] ENDOSCOPY;  Service: Endoscopy;  Laterality: N/A;   FOOT SURGERY     right foot   FOOT SURGERY Right 2017    LEG SURGERIES     HX.OF MULTIPLE SURGERIES FOR LEG LENGTH DUE TO POLIO   LUMBAR LAMINECTOMY/DECOMPRESSION MICRODISCECTOMY N/A 01/26/2019   Procedure: LUMBAR LAMINECTOMY/DECOMPRESSION MICRODISCECTOMY 1 LEVEL L3-4;  Surgeon: Bluford Standing, MD;  Location: [facility] ORS;  Service: Neurosurgery;  Laterality: N/A;   NECK SURGERY     PILONIDAL CYST / SINUS EXCISION     Patient Active Problem List   Diagnosis Date Noted   Ambulatory dysfunction 10/22/2023   Prediabetes 02/05/2023   Anemia 08/01/2020   Elevated hemoglobin A1c 08/01/2020   Loss of memory 08/01/2020   Sesamoiditis 06/25/2019   S/P lumbar laminectomy 01/26/2019   Weakness of both lower limbs 10/24/2018   LVH (left ventricular hypertrophy) 08/29/2016   Allergy 02/20/2016   Amyloidosis (HCC) 02/20/2016   Thrombocytopenia (HCC) 11/23/2015   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   Airway hyperreactivity 05/07/2013   Benign fibroma of prostate 05/07/2013   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   Chronic diastolic heart failure (HCC) 11/18/2012   Proteinuria, Bence Jones 11/18/2012   Multiple allergies 10/29/2012   Hypertension 10/29/2012   RBBB (right bundle branch block with left anterior fascicular block) 10/29/2012   Proteinuria 10/29/2012    ONSET DATE: 04/22/24  REFERRING DIAG: R26.9 (ICD-10-CM) -  Altered gait   THERAPY DIAG:  Difficulty in walking, not elsewhere classified  Muscle weakness (generalized)  Other lack of coordination  Other abnormalities of gait and mobility  Unsteadiness on feet  Rationale for Evaluation and Treatment: Rehabilitation  SUBJECTIVE:                                                                                                                                                                                             SUBJECTIVE STATEMENT:  Pt reports he is feeling good today and is in no pain. Has not had any falls since last visit. Questions about proper squat technique for  HEP    From Eval:Pt wife report he has been inconsistent with exercise since discharge. She also assist with all of subjective. Pt has been wall and furniture surfing to get around home. Pt had one pseudo fall in last 6 months; he went on a 4's into the recycling can but could not get up. Pt has not been following trough with his exercise. Pt has been using the cane almost all of the time outside of the house in comparison to rollator.  Patient and caregiver want to work on general strength and mobility. Pt caregiver has some concerns with getting rollator into and out of the car.  As well as getting patient into and out of the car.  Pt accompanied by: significant other  PERTINENT HISTORY: . 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 No  PRECAUTIONS: Fall  RED FLAGS: None   WEIGHT BEARING RESTRICTIONS: No  FALLS: Has patient fallen in last 6 months? No fall but went to ground intentionally and could not get up   LIVING ENVIRONMENT: LIVING ENVIRONMENT: Lives with: lives with their spouse Lives in: House/apartment Stairs: 1 step into home Has following equipment at home: Retail banker - 2 wheeled *On Arts development officer for [employer]  PLOF: Independent with household mobility with device and Independent with community mobility with device  PATIENT GOALS: Improve balance and mobility   OBJECTIVE:  Note: Objective measures were completed at Evaluation unless otherwise noted.   COGNITION: Overall cognitive status: Impaired and Mild cogintive impairment     POSTURE: rounded shoulders, forward head, and increased thoracic kyphosis  LOWER EXTREMITY ROM:   WNL for tasks assessed    LOWER EXTREMITY MMT:    MMT Right Eval Left Eval  Hip flexion 4 4  Hip extension    Hip abduction 4+ 4+  Hip adduction 4+  4+  Hip  internal rotation    Hip external rotation    Knee flexion 4- 4-  Knee extension 4 4  Ankle dorsiflexion -   Ankle plantarflexion -   Ankle inversion    Ankle eversion    (Blank rows = not tested)  BED MOBILITY:  Not tested  TRANSFERS: Sit to stand: Complete Independence and on a few reps noted instability in standing  Assistive device utilized: None     Stand to sit: Complete Independence  Assistive device utilized: None      GAIT: Findings: Gait Characteristics: With cane pt does not use in classic 2 or 3 point pattern but uses sporadically, decreased step length- Right, and decreased step length- Left, Distance walked: 30 ft, Assistive device utilized:Single point cane, Level of assistance: CGA, and Comments: Describing gait with SPC in R UE   FUNCTIONAL TESTS:  5 times sit to stand: 15.55 sec, imbalance on a few Timed up and go (TUG): 22.62 sec with cane  6 minute walk test: Test visit 2  10 meter walk test: 22.67 sec with cane, 13.6 sec with rollator   Berg Balance Scale: Test visit 2    PATIENT SURVEYS:  LEFS  Extreme difficulty/unable (0), Quite a bit of difficulty (1), Moderate difficulty (2), Little difficulty (3), No difficulty (4) Survey date:  WIFE FILLS OUT 05/12/24  Any of your usual work, housework or school activities 3  2. Usual hobbies, recreational or sporting activities 2  3. Getting into/out of the bath 4  4. Walking between rooms 3  5. Putting on socks/shoes 2  6. Squatting  1  7. Lifting an object, like a bag of groceries from the floor 3  8. Performing light activities around your home 3  9. Performing heavy activities around your home 2  10. Getting into/out of a car 1  11. Walking 2 blocks 2  12. Walking 1 mile 1  13. Going up/down 10 stairs (1 flight) 1  14. Standing for 1 hour 2  15.  sitting for 1 hour 4  16. Running on even ground 1  17. Running on uneven ground 1  18. Making sharp turns while running fast 1  19. Hopping  0  20. Rolling  over in bed 2  Score total:  40                                                                                                                                 TREATMENT DATE: 06/04/24    TA- To improve functional movements patterns for everyday tasks  At rail:  Sit<>stand 3x8 no UE support  March x 12  Side stepping at rail 69ftx 5  Forward lunge x 8. Instruction for exercise technique to improved step length, but reduce lunge depth.  Side lunge x10 bil with instruction for improved hip hinge and single knee flexion to proper depth  Gait with 4WW x 153ft  with cues for symmetry on BLE.   NMR for improved motor control and righting reactions  Airex pad;  Normal BOS 3 x 30 sec without UE Support   Placing magnets on board 3 x5 for color sorting perform with BUE.  1 LE on pad 2 x 25 sec Min assist intermittently on airex pad      Unless otherwise stated, CGA was provided and gait belt donned in order to ensure pt safety   PATIENT EDUCATION: Education details: Pt educated throughout session about proper posture and technique with exercises. Improved exercise technique, movement at target joints, use of target muscles after min to mod verbal, visual, tactile cues. Person educated: Patient Education method: Explanation Education comprehension: verbalized understanding   HOME EXERCISE PROGRAM: Verbally added mini squat at bar to replace STS. No new handout provided, has handout from previous PT  Verbally added HS flexion with BTB to HEP. No new handout provided, has handout from previous PT    GOALS: Goals reviewed with patient? Yes  SHORT TERM GOALS: Target date: 06/09/2024       Patient will be independent in home exercise program to improve strength/mobility for better functional independence with ADLs. Baseline: No HEP currently  Goal status: INITIAL  LONG TERM GOALS: Target date: 08/04/2024  1.  Patient will complete five times sit to stand test in < 15 seconds  without LOB indicating an increased LE strength and improved balance. Baseline: 15.55 sec, imbalance on a few reps requiring CGA for safety Goal status: INITIAL  2.  Patient will improve LEFS score to 50   to demonstrate statistically significant improvement in mobility and quality of life as it relates to their LE strength and mobility.  Baseline: 40 Goal status: INITIAL   3.  Patient will increase Berg Balance score by > 6 points to demonstrate decreased fall risk during functional activities. Baseline: 38 Goal status: INITIAL   4.   Patient will reduce timed up and go to <11 seconds to reduce fall risk and demonstrate improved transfer/gait ability. Baseline: 22.62 sec with SPC  Goal status: INITIAL  5.   Patient will increase 10 meter walk test to >1.71m/s as to improve gait speed for better community ambulation and to reduce fall risk. Baseline: .44 m/s w/ SPC, .73 m/s with rollator Goal status: INITIAL  6.   Patient will increase six minute walk test distance by 150 ft or greater for progression to community ambulator and improve gait ability Baseline: 750 ft, increased shuffling style gait from minute 2 and on as well as increased trunk flexion from this point on as well.  Goal status: INITIAL      ASSESSMENT:  CLINICAL IMPRESSION: Pt presents to clinic in good spirits and motivated for PT. Pt continues with LE strength and power training with functional movement patterns to promote improved physical function and improved stability. Education provided for improved exercise technique for safety when performing at home. Difficulty with COM control on unlevel surface without UE support, will need to continue to address use of ankle and hip strategy to reduce fall risk. Will continue to progress in future sessions as pt is able. Pt will continue to benefit from skilled physical therapy intervention to address impairments, improve QOL, and attain therapy goals.     OBJECTIVE  IMPAIRMENTS: Abnormal gait, decreased activity tolerance, decreased balance, decreased endurance, decreased knowledge of condition, decreased knowledge of use of DME, and decreased strength.   ACTIVITY LIMITATIONS: standing, squatting, stairs, transfers, and locomotion  level  PARTICIPATION LIMITATIONS: meal prep, shopping, and community activity  PERSONAL FACTORS: Age, Behavior pattern, and 3+ comorbidities: HTN, Chronic Diastolic Heart Failure, post polio,  are also affecting patient's functional outcome.   REHAB POTENTIAL: Good  CLINICAL DECISION MAKING: Evolving/moderate complexity  EVALUATION COMPLEXITY: Moderate  PLAN:  PT FREQUENCY: 2x/week  PT DURATION: 12 weeks  PLANNED INTERVENTIONS: 97750- Physical Performance Testing, 97110-Therapeutic exercises, 97530- Therapeutic activity, [id number]- Neuromuscular re-education, 97535- Self Care, [id number]- Manual therapy, and [id number]- Gait training  PLAN FOR NEXT SESSION:    Progress Hip flexor intervention, endurance and extensors. Initiate static balance interventions reflective of BERG deficits   Massie FORBES Dollar PT ,DPT Physical Therapist- [place]  [facility]    Physical Therapist- [facility] 06/04/2024, 2:03 PM

## 2024-06-08 ENCOUNTER — Encounter: Payer: PPO | Admitting: Speech Pathology

## 2024-06-08 ENCOUNTER — Ambulatory Visit: Payer: PPO | Admitting: Physical Therapy

## 2024-06-10 ENCOUNTER — Ambulatory Visit: Admitting: Physical Therapy

## 2024-06-10 ENCOUNTER — Encounter: Payer: Self-pay | Admitting: Physical Therapy

## 2024-06-10 DIAGNOSIS — M6281 Muscle weakness (generalized): Secondary | ICD-10-CM | POA: Insufficient documentation

## 2024-06-10 DIAGNOSIS — R2689 Other abnormalities of gait and mobility: Secondary | ICD-10-CM | POA: Insufficient documentation

## 2024-06-10 DIAGNOSIS — R2681 Unsteadiness on feet: Secondary | ICD-10-CM | POA: Diagnosis not present

## 2024-06-10 DIAGNOSIS — R262 Difficulty in walking, not elsewhere classified: Secondary | ICD-10-CM | POA: Diagnosis not present

## 2024-06-10 DIAGNOSIS — R278 Other lack of coordination: Secondary | ICD-10-CM | POA: Insufficient documentation

## 2024-06-10 NOTE — Therapy (Signed)
 OUTPATIENT PHYSICAL THERAPY NEURO TREATMENT   Patient Name: Eugene Martinez. MRN: [id number] DOB:17-Dec-1945, 78 y.o., male Today's Date: 06/10/2024   PCP: Sadie Manna, MD  REFERRING PROVIDER: Caffaro, Kaitlin T, PA-C   END OF SESSION:  PT End of Session - 06/10/24 1412     Visit Number 9    Number of Visits 25    Date for PT Re-Evaluation 08/04/24    PT Start Time 1405    PT Stop Time 1445    PT Time Calculation (min) 40 min    Equipment Utilized During Treatment Gait belt    Activity Tolerance Patient tolerated treatment well    Behavior During Therapy WFL for tasks assessed/performed                Past Medical History:  Diagnosis Date   Allergic state    Amyloidosis (HCC)    Anemia    BPH (benign prostatic hyperplasia)    DDD (degenerative disc disease), lumbar    Dysplastic nevus 10/13/2013   Left posterior waistline paraspinal. Moderate to severe atypia, pattern borders on early evolving MIS, edge involved. Excised 03/31/2014, margins free.   Dysplastic nevus 12/19/2017   Right upper back medial. Mild atypia, lateral and deep margins involved.    Dysplastic nevus 11/28/2022   right prox lat thigh, Severe atypia, Excised 01/08/23   Dysrhythmia    Environmental and seasonal allergies    Gallop rhythm    GERD (gastroesophageal reflux disease)    Hypertension    Mild mitral regurgitation    a. by echo 10/2012   Pain    bil. 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 10/2012 Echo: EF 55-60%, severe LVH with speckling of myocardium -? amyloid. Mild MR, mod dil LA.   Past Surgical History:  Procedure Laterality Date   CIRCUMCISION  1984   COLONOSCOPY  03/2012   COLONOSCOPY WITH PROPOFOL  N/A 10/29/2017   Procedure: COLONOSCOPY WITH PROPOFOL ;  Surgeon: Gaylyn Gladis PENNER, MD;  Location: [facility] ENDOSCOPY;  Service: Endoscopy;  Laterality: N/A;   FOOT SURGERY     right foot   FOOT SURGERY Right 2017   LEG SURGERIES     HX.OF  MULTIPLE SURGERIES FOR LEG LENGTH DUE TO POLIO   LUMBAR LAMINECTOMY/DECOMPRESSION MICRODISCECTOMY N/A 01/26/2019   Procedure: LUMBAR LAMINECTOMY/DECOMPRESSION MICRODISCECTOMY 1 LEVEL L3-4;  Surgeon: Bluford Standing, MD;  Location: [facility] ORS;  Service: Neurosurgery;  Laterality: N/A;   NECK SURGERY     PILONIDAL CYST / SINUS EXCISION     Patient Active Problem List   Diagnosis Date Noted   Ambulatory dysfunction 10/22/2023   Prediabetes 02/05/2023   Anemia 08/01/2020   Elevated hemoglobin A1c 08/01/2020   Loss of memory 08/01/2020   Sesamoiditis 06/25/2019   S/P lumbar laminectomy 01/26/2019   Weakness of both lower limbs 10/24/2018   LVH (left ventricular hypertrophy) 08/29/2016   Allergy 02/20/2016   Amyloidosis (HCC) 02/20/2016   Thrombocytopenia (HCC) 11/23/2015   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   Airway hyperreactivity 05/07/2013   Benign fibroma of prostate 05/07/2013   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   Chronic diastolic heart failure (HCC) 11/18/2012   Proteinuria, Bence Jones 11/18/2012   Multiple allergies 10/29/2012   Hypertension 10/29/2012   RBBB (right bundle branch block with left anterior fascicular block) 10/29/2012   Proteinuria 10/29/2012    ONSET DATE: 04/22/24  REFERRING DIAG: R26.9 (ICD-10-CM) - Altered gait   THERAPY DIAG:  Difficulty  in walking, not elsewhere classified  Muscle weakness (generalized)  Other abnormalities of gait and mobility  Unsteadiness on feet  Rationale for Evaluation and Treatment: Rehabilitation  SUBJECTIVE:                                                                                                                                                                                             SUBJECTIVE STATEMENT:  Pt reports feeling good and thinking he is getting better. Has been trying to do his HEP more often. Citing he exercised 2 times since last Thursday.    From Eval:Pt wife report he has been inconsistent  with exercise since discharge. She also assist with all of subjective. Pt has been wall and furniture surfing to get around home. Pt had one pseudo fall in last 6 months; he went on a 4's into the recycling can but could not get up. Pt has not been following trough with his exercise. Pt has been using the cane almost all of the time outside of the house in comparison to rollator.  Patient and caregiver want to work on general strength and mobility. Pt caregiver has some concerns with getting rollator into and out of the car.  As well as getting patient into and out of the car.  Pt accompanied by: significant other  PERTINENT HISTORY: . 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 No  PRECAUTIONS: Fall  RED FLAGS: None   WEIGHT BEARING RESTRICTIONS: No  FALLS: Has patient fallen in last 6 months? 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   COGNITION: Overall cognitive status: Impaired and Mild cogintive impairment     POSTURE: rounded shoulders, forward head, and increased thoracic kyphosis  LOWER EXTREMITY ROM:   WNL for tasks assessed    LOWER EXTREMITY MMT:    MMT Right Eval Left Eval  Hip flexion 4 4  Hip extension    Hip abduction 4+ 4+  Hip adduction 4+ 4+  Hip internal rotation    Hip external rotation  Knee flexion 4-  4-  Knee extension 4 4  Ankle dorsiflexion -   Ankle plantarflexion -   Ankle inversion    Ankle eversion    (Blank rows = not tested)  BED MOBILITY:  Not tested  TRANSFERS: Sit to stand: Complete Independence and on a few reps noted instability in standing  Assistive device utilized: None     Stand to sit: Complete Independence  Assistive device utilized: None      GAIT: Findings: Gait Characteristics: With cane pt does not use in classic 2 or 3 point pattern but uses sporadically, decreased step length- Right, and decreased step length- Left, Distance walked: 30 ft, Assistive device utilized:Single point cane, Level of assistance: CGA, and Comments: Describing gait with SPC in R UE   FUNCTIONAL TESTS:  5 times sit to stand: 15.55 sec, imbalance on a few Timed up and go (TUG): 22.62 sec with cane  6 minute walk test: Test visit 2  10 meter walk test: 22.67 sec with cane, 13.6 sec with rollator   Berg Balance Scale: Test visit 2    PATIENT SURVEYS:  LEFS  Extreme difficulty/unable (0), Quite a bit of difficulty (1), Moderate difficulty (2), Little difficulty (3), No difficulty (4) Survey date:  WIFE FILLS OUT 05/12/24  Any of your usual work, housework or school activities 3  2. Usual hobbies, recreational or sporting activities 2  3. Getting into/out of the bath 4  4. Walking between rooms 3  5. Putting on socks/shoes 2  6. Squatting  1  7. Lifting an object, like a bag of groceries from the floor 3  8. Performing light activities around your home 3  9. Performing heavy activities around your home 2  10. Getting into/out of a car 1  11. Walking 2 blocks 2  12. Walking 1 mile 1  13. Going up/down 10 stairs (1 flight) 1  14. Standing for 1 hour 2  15.  sitting for 1 hour 4  16. Running on even ground 1  17. Running on uneven ground 1  18. Making sharp turns while running fast 1  19. Hopping  0  20. Rolling over in bed 2  Score total:  40                                                                                                                                  TREATMENT DATE: 06/10/24     TA- To improve functional movements patterns for everyday tasks   Sit<>stand 3x10 holding 5kg med ball   Forward lunge 3x 6 BLE at safety bar. Instruction for exercise technique to improved step length, but reduce lunge depth. - 1 LOB while coming from a sit to a stand, displaying significant arm swing and lateral lean. Pt was able to maintain S UE support to prevent fall along with minA from SPT.   TE- To improve strength, endurance, mobility, and function  of specific targeted muscle groups or improve joint range of motion or improve muscle flexibility  SL leg press - 2x10, 30# on L 25# on R. SPT assist w eccentric control on R LE by using hips to control eccentric phase   DL leg press 1x8 - 44#      Gait Training   Gait with 4# AW - 128ft. VC for high step height.    Unless otherwise stated, CGA was provided and gait belt donned in order to ensure pt safety   PATIENT EDUCATION: Education details: Pt educated throughout session about proper posture and technique with exercises. Improved exercise technique, movement at target joints, use of target muscles after min to mod verbal, visual, tactile cues. Person educated: Patient Education method: Explanation Education comprehension: verbalized understanding   HOME EXERCISE PROGRAM: Verbally added mini squat at bar to replace STS. No new handout provided, has handout from previous PT  Verbally added HS flexion with BTB to HEP. No new handout provided, has handout from previous PT    GOALS: Goals reviewed with patient? Yes  SHORT TERM GOALS: Target date: 06/09/2024       Patient will be independent in home exercise program to improve strength/mobility for better functional independence with ADLs. Baseline: No HEP currently  Goal status: INITIAL  LONG TERM GOALS: Target date: 08/04/2024  1.   Patient will complete five times sit to stand test in < 15 seconds without LOB indicating an increased LE strength and improved balance. Baseline: 15.55 sec, imbalance on a few reps requiring CGA for safety Goal status: INITIAL  2.  Patient will improve LEFS score to 50   to demonstrate statistically significant improvement in mobility and quality of life as it relates to their LE strength and mobility.  Baseline: 40 Goal status: INITIAL   3.  Patient will increase Berg Balance score by > 6 points to demonstrate decreased fall risk during functional activities. Baseline: 38 Goal status: INITIAL   4.   Patient will reduce timed up and go to <11 seconds to reduce fall risk and demonstrate improved transfer/gait ability. Baseline: 22.62 sec with SPC  Goal status: INITIAL  5.   Patient will increase 10 meter walk test to >1.52m/s as to improve gait speed for better community ambulation and to reduce fall risk. Baseline: .44 m/s w/ SPC, .73 m/s with rollator Goal status: INITIAL  6.   Patient will increase six minute walk test distance by 150 ft or greater for progression to community ambulator and improve gait ability Baseline: 750 ft, increased shuffling style gait from minute 2 and on as well as increased trunk flexion from this point on as well.  Goal status: INITIAL      ASSESSMENT:  CLINICAL IMPRESSION: Pt presents to clinic in good spirits and motivated for PT. Pt continues with LE strength and power training with functional movement patterns to promote improved physical function and improved stability. Difficulty coming from a lunge to standing position c S UE support. Exhibited 1 LOB while coming from a lunge to a stand. Pt was able to maintain S UE support and SPT provided minA to prevent fall. Will continue to progress in future sessions as pt is able. Pt will continue to benefit from skilled physical therapy intervention to address impairments, improve QOL, and attain therapy  goals.     OBJECTIVE IMPAIRMENTS: Abnormal gait, decreased activity tolerance, decreased balance, decreased endurance, decreased knowledge of condition, decreased knowledge of use of DME, and decreased strength.  ACTIVITY LIMITATIONS: standing, squatting, stairs, transfers, and locomotion level  PARTICIPATION LIMITATIONS: meal prep, shopping, and community activity  PERSONAL FACTORS: Age, Behavior pattern, and 3+ comorbidities: HTN, Chronic Diastolic Heart Failure, post polio,  are also affecting patient's functional outcome.   REHAB POTENTIAL: Good  CLINICAL DECISION MAKING: Evolving/moderate complexity  EVALUATION COMPLEXITY: Moderate  PLAN:  PT FREQUENCY: 2x/week  PT DURATION: 12 weeks  PLANNED INTERVENTIONS: 97750- Physical Performance Testing, 97110-Therapeutic exercises, 97530- Therapeutic activity, [id number]- Neuromuscular re-education, 97535- Self Care, [id number]- Manual therapy, and [id number]- Gait training  PLAN FOR NEXT SESSION:   Progress Hip flexor intervention, endurance and extensors. Initiate static balance interventions reflective of BERG deficits   Casilda Human, SPT   [place]  [facility] 06/10/2024, 4:50 PM

## 2024-06-11 ENCOUNTER — Encounter: Payer: PPO | Admitting: Speech Pathology

## 2024-06-11 ENCOUNTER — Ambulatory Visit: Payer: PPO | Admitting: Physical Therapy

## 2024-06-15 ENCOUNTER — Encounter: Payer: PPO | Admitting: Speech Pathology

## 2024-06-15 ENCOUNTER — Encounter: Payer: Self-pay | Admitting: Physical Therapy

## 2024-06-15 ENCOUNTER — Ambulatory Visit: Admitting: Physical Therapy

## 2024-06-15 ENCOUNTER — Ambulatory Visit: Payer: PPO | Admitting: Physical Therapy

## 2024-06-15 DIAGNOSIS — R2681 Unsteadiness on feet: Secondary | ICD-10-CM

## 2024-06-15 DIAGNOSIS — R2689 Other abnormalities of gait and mobility: Secondary | ICD-10-CM

## 2024-06-15 DIAGNOSIS — M6281 Muscle weakness (generalized): Secondary | ICD-10-CM

## 2024-06-15 DIAGNOSIS — R262 Difficulty in walking, not elsewhere classified: Secondary | ICD-10-CM | POA: Diagnosis not present

## 2024-06-15 NOTE — Therapy (Signed)
 OUTPATIENT PHYSICAL THERAPY NEURO PROGRESS NOTE 05/14/24-06/15/24   Patient Name: Eugene Martinez. MRN: [id number] DOB:1946-02-28, 78 y.o., male Today's Date: 06/15/2024   PCP: Sadie Manna, MD  REFERRING PROVIDER: Caffaro, Kaitlin T, PA-C   END OF SESSION:  PT End of Session - 06/15/24 1116     Visit Number 10    Number of Visits 25    Date for PT Re-Evaluation 08/04/24    Progress Note Due on Visit 10    PT Start Time 1018    PT Stop Time 1057    PT Time Calculation (min) 39 min    Equipment Utilized During Treatment Gait belt    Activity Tolerance Patient tolerated treatment well    Behavior During Therapy WFL for tasks assessed/performed                 Past Medical History:  Diagnosis Date   Allergic state    Amyloidosis (HCC)    Anemia    BPH (benign prostatic hyperplasia)    DDD (degenerative disc disease), lumbar    Dysplastic nevus 10/13/2013   Left posterior waistline paraspinal. Moderate to severe atypia, pattern borders on early evolving MIS, edge involved. Excised 03/31/2014, margins free.   Dysplastic nevus 12/19/2017   Right upper back medial. Mild atypia, lateral and deep margins involved.    Dysplastic nevus 11/28/2022   right prox lat thigh, Severe atypia, Excised 01/08/23   Dysrhythmia    Environmental and seasonal allergies    Gallop rhythm    GERD (gastroesophageal reflux disease)    Hypertension    Mild mitral regurgitation    a. by echo 10/2012   Pain    bil. 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 10/2012 Echo: EF 55-60%, severe LVH with speckling of myocardium -? amyloid. Mild MR, mod dil LA.   Past Surgical History:  Procedure Laterality Date   CIRCUMCISION  1984   COLONOSCOPY  03/2012   COLONOSCOPY WITH PROPOFOL  N/A 10/29/2017   Procedure: COLONOSCOPY WITH PROPOFOL ;  Surgeon: Gaylyn Gladis PENNER, MD;  Location: [facility];  Service: Endoscopy;  Laterality: N/A;   FOOT SURGERY     right foot    FOOT SURGERY Right 2017   LEG SURGERIES     HX.OF MULTIPLE SURGERIES FOR LEG LENGTH DUE TO POLIO   LUMBAR LAMINECTOMY/DECOMPRESSION MICRODISCECTOMY N/A 01/26/2019   Procedure: LUMBAR LAMINECTOMY/DECOMPRESSION MICRODISCECTOMY 1 LEVEL L3-4;  Surgeon: Bluford Standing, MD;  Location: [facility] ORS;  Service: Neurosurgery;  Laterality: N/A;   NECK SURGERY     PILONIDAL CYST / SINUS EXCISION     Patient Active Problem List   Diagnosis Date Noted   Ambulatory dysfunction 10/22/2023   Prediabetes 02/05/2023   Anemia 08/01/2020   Elevated hemoglobin A1c 08/01/2020   Loss of memory 08/01/2020   Sesamoiditis 06/25/2019   S/P lumbar laminectomy 01/26/2019   Weakness of both lower limbs 10/24/2018   LVH (left ventricular hypertrophy) 08/29/2016   Allergy 02/20/2016   Amyloidosis (HCC) 02/20/2016   Thrombocytopenia (HCC) 11/23/2015   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   Airway hyperreactivity 05/07/2013   Benign fibroma of prostate 05/07/2013   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   Chronic diastolic heart failure (HCC) 11/18/2012   Proteinuria, Bence Jones 11/18/2012   Multiple allergies 10/29/2012   Hypertension 10/29/2012   RBBB (right bundle branch block with left anterior fascicular block) 10/29/2012   Proteinuria 10/29/2012    ONSET DATE: 04/22/24  REFERRING  DIAG: R26.9 (ICD-10-CM) - Altered gait   THERAPY DIAG:  Difficulty in walking, not elsewhere classified  Muscle weakness (generalized)  Unsteadiness on feet  Other abnormalities of gait and mobility  Rationale for Evaluation and Treatment: Rehabilitation  SUBJECTIVE:                                                                                                                                                                                             SUBJECTIVE STATEMENT:   Pt reports he is doing good, no changes from last visit. Pt reports doing his HEP 3 days a week.    From Eval:Pt wife report he has been inconsistent  with exercise since discharge. She also assist with all of subjective. Pt has been wall and furniture surfing to get around home. Pt had one pseudo fall in last 6 months; he went on a 4's into the recycling can but could not get up. Pt has not been following trough with his exercise. Pt has been using the cane almost all of the time outside of the house in comparison to rollator.  Patient and caregiver want to work on general strength and mobility. Pt caregiver has some concerns with getting rollator into and out of the car.  As well as getting patient into and out of the car.  Pt accompanied by: significant other  PERTINENT HISTORY: . 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 No  PRECAUTIONS: Fall  RED FLAGS: None   WEIGHT BEARING RESTRICTIONS: No  FALLS: Has patient fallen in last 6 months? No fall but went to ground intentionally and could not get up   LIVING ENVIRONMENT: LIVING ENVIRONMENT: Lives with: lives with their spouse Lives in: House/apartment Stairs: 1 step into home Has following equipment at home: Retail banker - 2 wheeled *On Arts development officer for [employer]  PLOF: Independent with household mobility with device and Independent with community mobility with device  PATIENT GOALS: Improve balance and mobility   OBJECTIVE:  Note: Objective measures were completed at Evaluation unless otherwise noted.   COGNITION: Overall cognitive status: Impaired and Mild cogintive impairment     POSTURE: rounded shoulders, forward head, and increased thoracic kyphosis  LOWER EXTREMITY ROM:   WNL for tasks assessed    LOWER EXTREMITY MMT:    MMT Right Eval Left Eval  Hip flexion 4 4  Hip extension    Hip abduction 4+ 4+  Hip adduction 4+ 4+  Hip internal rotation  Hip external rotation    Knee flexion 4-  4-  Knee extension 4 4  Ankle dorsiflexion -   Ankle plantarflexion -   Ankle inversion    Ankle eversion    (Blank rows = not tested)  BED MOBILITY:  Not tested  TRANSFERS: Sit to stand: Complete Independence and on a few reps noted instability in standing  Assistive device utilized: None     Stand to sit: Complete Independence  Assistive device utilized: None      GAIT: Findings: Gait Characteristics: With cane pt does not use in classic 2 or 3 point pattern but uses sporadically, decreased step length- Right, and decreased step length- Left, Distance walked: 30 ft, Assistive device utilized:Single point cane, Level of assistance: CGA, and Comments: Describing gait with SPC in R UE   FUNCTIONAL TESTS:  5 times sit to stand: 15.55 sec, imbalance on a few Timed up and go (TUG): 22.62 sec with cane  6 minute walk test: Test visit 2  10 meter walk test: 22.67 sec with cane, 13.6 sec with rollator   Berg Balance Scale: Test visit 2    PATIENT SURVEYS:  LEFS  Extreme difficulty/unable (0), Quite a bit of difficulty (1), Moderate difficulty (2), Little difficulty (3), No difficulty (4) Survey date:  WIFE FILLS OUT 05/12/24  Any of your usual work, housework or school activities 3  2. Usual hobbies, recreational or sporting activities 2  3. Getting into/out of the bath 4  4. Walking between rooms 3  5. Putting on socks/shoes 2  6. Squatting  1  7. Lifting an object, like a bag of groceries from the floor 3  8. Performing light activities around your home 3  9. Performing heavy activities around your home 2  10. Getting into/out of a car 1  11. Walking 2 blocks 2  12. Walking 1 mile 1  13. Going up/down 10 stairs (1 flight) 1  14. Standing for 1 hour 2  15.  sitting for 1 hour 4  16. Running on even ground 1  17. Running on uneven ground 1  18. Making sharp turns while running fast 1  19. Hopping  0  20. Rolling over in bed 2  Score total:  40                                                                                                                                  TREATMENT DATE: 06/15/24   Physical therapy treatment session today consisted of completing assessment of goals and administration of testing as demonstrated and documented in flow sheet, treatment, and goals section of this note. Addition treatments may be found below.    PHYSICAL PERFORMANCE  LEFS  - 42/80  Five times Sit to Stand Test (FTSS)  TIME: 14.11 sec  Cut off scores indicative of increased fall risk: >12 sec CVA, >16 sec PD, >13 sec vestibular (ANPTA Core Set of  Outcome Measures for Adults with Neurologic Conditions, 2018)  PT instructed pt in TUG: 18.3 sec ( >13.5 sec indicates increased fall risk)   10 Meter Walk Test: Patient instructed to walk 10 meters (32.8 ft) as quickly and as safely as possible at their normal speed Results: 0.75 m/s (13.4 seconds )  Cut off scores:   Household Ambulator  < 0.4 m/s  Limited Community Ambulator  0.4 - 0.8 m/s  [employer]  > 0.8 m/s  Increased fall risk  < 1.65m/s  Crossing a Street  >1.65m/s  MCID 0.05 m/s (small), 0.13 m/s (moderate), 0.06 m/s (significant)  (ANPTA Core Set of Outcome Measures for Adults with Neurologic Conditions, 2018)     6 Min Walk Test:  Instructed patient to ambulate as quickly and as safely as possible for 6 minutes using LRAD. Patient was allowed to take standing rest breaks without stopping the test, but if the patient required a sitting rest break the clock would be stopped and the test would be over.  Results: 750 feet using a 4ww with sba. Results indicate that the patient has reduced endurance with ambulation compared to age matched norms.  Age Matched Norms (in meters): 6-69 yo M: 43 F: 39, 29-79 yo M: 62 F: 471, 19-89 yo M: 417 F: 392 MDC: 58.21 meters (190.98 feet) or 50 meters (ANPTA Core Set of Outcome Measures for Adults with Neurologic Conditions, 2018)     PATIENT  EDUCATION: Education details: Pt educated throughout session about proper posture and technique with exercises. Improved exercise technique, movement at target joints, use of target muscles after min to mod verbal, visual, tactile cues. Person educated: Patient Education method: Explanation Education comprehension: verbalized understanding   HOME EXERCISE PROGRAM: Verbally added mini squat at bar to replace STS. No new handout provided, has handout from previous PT  Verbally added HS flexion with BTB to HEP. No new handout provided, has handout from previous PT    GOALS: Goals reviewed with patient? Yes  SHORT TERM GOALS: Target date: 06/09/2024       Patient will be independent in home exercise program to improve strength/mobility for better functional independence with ADLs. Baseline: No HEP currently  Goal status: INITIAL  LONG TERM GOALS: Target date: 08/04/2024  1.  Patient will complete five times sit to stand test in < 15 seconds without LOB indicating an increased LE strength and improved balance. Baseline: 15.55 sec, imbalance on a few reps requiring CGA for safety, 7/7: 14.11 Goal status: MET  2.  Patient will improve LEFS score to 50   to demonstrate statistically significant improvement in mobility and quality of life as it relates to their LE strength and mobility.  Baseline: 40, 06/15/24: 42 Goal status: ONGOING   3.  Patient will increase Berg Balance score by > 6 points to demonstrate decreased fall risk during functional activities. Baseline: 38 Goal status: INITIAL   4.   Patient will reduce timed up and go to <11 seconds to reduce fall risk and demonstrate improved transfer/gait ability. Baseline: 22.62 sec with SPC  Goal status: 7/7: 18.30 c 4WW  5.   Patient will increase 10 meter walk test to >1.63m/s as to improve gait speed for better community ambulation and to reduce fall risk. Baseline: .44 m/s w/ SPC, .73 m/s with rollator 06/15/24: 0.75 m/s Goal  status: INITIAL  6.   Patient will increase six minute walk test distance by 150 ft or greater for progression to community ambulator  and improve gait ability Baseline: 750 ft, increased shuffling style gait from minute 2 and on as well as increased trunk flexion from this point on as well. 06/15/24: 717ft Goal status: Ongoing      ASSESSMENT:  CLINICAL IMPRESSION:  Pt presents to clinic today for progress note. Pt has made good progress with strength and power deficits AEB meeting his 5xSTS goal. Pt has increased gait speed but could benefit from additional gait training to increase step length to further increase speed, reducing his risk of falls. Pt will benefit from endurance training to reduce shuffling when fatigued. AEB his tendency to shuffle during the end of his . Pt will continue to benefit from skilled physical therapy intervention to address impairments, improve QOL, and attain therapy goals.      OBJECTIVE IMPAIRMENTS: Abnormal gait, decreased activity tolerance, decreased balance, decreased endurance, decreased knowledge of condition, decreased knowledge of use of DME, and decreased strength.   ACTIVITY LIMITATIONS: standing, squatting, stairs, transfers, and locomotion level  PARTICIPATION LIMITATIONS: meal prep, shopping, and community activity  PERSONAL FACTORS: Age, Behavior pattern, and 3+ comorbidities: HTN, Chronic Diastolic Heart Failure, post polio,  are also affecting patient's functional outcome.   REHAB POTENTIAL: Good  CLINICAL DECISION MAKING: Evolving/moderate complexity  EVALUATION COMPLEXITY: Moderate  PLAN:  PT FREQUENCY: 2x/week  PT DURATION: 12 weeks  PLANNED INTERVENTIONS: 97750- Physical Performance Testing, 97110-Therapeutic exercises, 97530- Therapeutic activity, 97112- Neuromuscular re-education, 97535- Self Care, [id number]- Manual therapy, and [id number]- Gait training  PLAN FOR NEXT SESSION:   Progress Hip extensors and endurance training,  Step length, BERG   Casilda Human, SPT   [place]  [facility] 06/15/2024, 11:18 AM

## 2024-06-17 ENCOUNTER — Encounter: Payer: Self-pay | Admitting: Physical Therapy

## 2024-06-17 ENCOUNTER — Ambulatory Visit: Admitting: Physical Therapy

## 2024-06-17 DIAGNOSIS — M6281 Muscle weakness (generalized): Secondary | ICD-10-CM

## 2024-06-17 DIAGNOSIS — R2689 Other abnormalities of gait and mobility: Secondary | ICD-10-CM

## 2024-06-17 DIAGNOSIS — R262 Difficulty in walking, not elsewhere classified: Secondary | ICD-10-CM

## 2024-06-17 DIAGNOSIS — R2681 Unsteadiness on feet: Secondary | ICD-10-CM

## 2024-06-17 NOTE — Therapy (Unsigned)
 OUTPATIENT PHYSICAL THERAPY NEURO TREATMENT   Patient Name: Eugene Martinez. MRN: [id number] DOB:October 20, 1946, 78 y.o., male Today's Date: 06/17/2024   PCP: Sadie Manna, MD  REFERRING PROVIDER: Abe Erlinda DASEN, PA-C   END OF SESSION:  PT End of Session - 06/17/24 1446     Visit Number 11    Number of Visits 25    Date for PT Re-Evaluation 08/04/24    Progress Note Due on Visit 20    PT Start Time 1447    PT Stop Time 1530    PT Time Calculation (min) 43 min    Equipment Utilized During Treatment Gait belt    Activity Tolerance Patient tolerated treatment well    Behavior During Therapy WFL for tasks assessed/performed                Past Medical History:  Diagnosis Date   Allergic state    Amyloidosis (HCC)    Anemia    BPH (benign prostatic hyperplasia)    DDD (degenerative disc disease), lumbar    Dysplastic nevus 10/13/2013   Left posterior waistline paraspinal. Moderate to severe atypia, pattern borders on early evolving MIS, edge involved. Excised 03/31/2014, margins free.   Dysplastic nevus 12/19/2017   Right upper back medial. Mild atypia, lateral and deep margins involved.    Dysplastic nevus 11/28/2022   right prox lat thigh, Severe atypia, Excised 01/08/23   Dysrhythmia    Environmental and seasonal allergies    Gallop rhythm    GERD (gastroesophageal reflux disease)    Hypertension    Mild mitral regurgitation    a. by echo 10/2012   Pain    bil. 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 10/2012 Echo: EF 55-60%, severe LVH with speckling of myocardium -? amyloid. Mild MR, mod dil LA.   Past Surgical History:  Procedure Laterality Date   CIRCUMCISION  1984   COLONOSCOPY  03/2012   COLONOSCOPY WITH PROPOFOL  N/A 10/29/2017   Procedure: COLONOSCOPY WITH PROPOFOL ;  Surgeon: Gaylyn Gladis PENNER, MD;  Location: [facility] ENDOSCOPY;  Service: Endoscopy;  Laterality: N/A;   FOOT SURGERY     right foot   FOOT SURGERY Right  2017   LEG SURGERIES     HX.OF MULTIPLE SURGERIES FOR LEG LENGTH DUE TO POLIO   LUMBAR LAMINECTOMY/DECOMPRESSION MICRODISCECTOMY N/A 01/26/2019   Procedure: LUMBAR LAMINECTOMY/DECOMPRESSION MICRODISCECTOMY 1 LEVEL L3-4;  Surgeon: Bluford Standing, MD;  Location: [facility] ORS;  Service: Neurosurgery;  Laterality: N/A;   NECK SURGERY     PILONIDAL CYST / SINUS EXCISION     Patient Active Problem List   Diagnosis Date Noted   Ambulatory dysfunction 10/22/2023   Prediabetes 02/05/2023   Anemia 08/01/2020   Elevated hemoglobin A1c 08/01/2020   Loss of memory 08/01/2020   Sesamoiditis 06/25/2019   S/P lumbar laminectomy 01/26/2019   Weakness of both lower limbs 10/24/2018   LVH (left ventricular hypertrophy) 08/29/2016   Allergy 02/20/2016   Amyloidosis (HCC) 02/20/2016   Thrombocytopenia (HCC) 11/23/2015   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   Airway hyperreactivity 05/07/2013   Benign fibroma of prostate 05/07/2013   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   Chronic diastolic heart failure (HCC) 11/18/2012   Proteinuria, Bence Jones 11/18/2012   Multiple allergies 10/29/2012   Hypertension 10/29/2012   RBBB (right bundle branch block with left anterior fascicular block) 10/29/2012   Proteinuria 10/29/2012    ONSET DATE: 04/22/24  REFERRING DIAG: R26.9 (ICD-10-CM) -  Altered gait   THERAPY DIAG:  Difficulty in walking, not elsewhere classified  Unsteadiness on feet  Other abnormalities of gait and mobility  Muscle weakness (generalized)  Rationale for Evaluation and Treatment: Rehabilitation  SUBJECTIVE:                                                                                                                                                                                             SUBJECTIVE STATEMENT:  Pt's wife reports he had a fall today and Monday. Claims he fell getting out of the bed, but that was mostly him dropping down slowly off the bed. Today, she was unable to  witness his fall, but claims pt was taking his rollator out of the car and fell as she was taking groceries in the house. Pt does not remember the fall, and cites the heat as a likely reason for why he fell.    From Eval:Pt wife report he has been inconsistent with exercise since discharge. She also assist with all of subjective. Pt has been wall and furniture surfing to get around home. Pt had one pseudo fall in last 6 months; he went on a 4's into the recycling can but could not get up. Pt has not been following trough with his exercise. Pt has been using the cane almost all of the time outside of the house in comparison to rollator.  Patient and caregiver want to work on general strength and mobility. Pt caregiver has some concerns with getting rollator into and out of the car.  As well as getting patient into and out of the car.  Pt accompanied by: significant other  PERTINENT HISTORY: . 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 No  PRECAUTIONS: Fall  RED FLAGS: None   WEIGHT BEARING RESTRICTIONS: No  FALLS: Has patient fallen in last 6 months? No fall but went to ground intentionally and could not get up   LIVING ENVIRONMENT: LIVING ENVIRONMENT: Lives with: lives with their spouse Lives in: House/apartment Stairs: 1 step into home Has following equipment at home: Retail banker - 2 wheeled *On Arts development officer for [employer] at [employer]  PLOF: Independent with household mobility with device and Independent with community mobility with device  PATIENT GOALS: Improve balance and mobility   OBJECTIVE:  Note: Objective measures were completed at Evaluation unless otherwise noted.   COGNITION: Overall cognitive status: Impaired and Mild cogintive impairment     POSTURE: rounded shoulders, forward head, and increased thoracic  kyphosis  LOWER EXTREMITY ROM:   WNL for tasks assessed    LOWER EXTREMITY MMT:    MMT Right Eval Left Eval  Hip flexion 4 4  Hip extension    Hip abduction 4+ 4+  Hip adduction 4+ 4+  Hip internal rotation    Hip external rotation    Knee flexion 4- 4-  Knee extension 4 4  Ankle dorsiflexion -   Ankle plantarflexion -   Ankle inversion    Ankle eversion    (Blank rows = not tested)  BED MOBILITY:  Not tested  TRANSFERS: Sit to stand: Complete Independence and on a few reps noted instability in standing  Assistive device utilized: None     Stand to sit: Complete Independence  Assistive device utilized: None      GAIT: Findings: Gait Characteristics: With cane pt does not use in classic 2 or 3 point pattern but uses sporadically, decreased step length- Right, and decreased step length- Left, Distance walked: 30 ft, Assistive device utilized:Single point cane, Level of assistance: CGA, and Comments: Describing gait with SPC in R UE   FUNCTIONAL TESTS:  5 times sit to stand: 15.55 sec, imbalance on a few Timed up and go (TUG): 22.62 sec with cane  6 minute walk test: Test visit 2  10 meter walk test: 22.67 sec with cane, 13.6 sec with rollator   Berg Balance Scale: Test visit 2    PATIENT SURVEYS:  LEFS  Extreme difficulty/unable (0), Quite a bit of difficulty (1), Moderate difficulty (2), Little difficulty (3), No difficulty (4) Survey date:  WIFE FILLS OUT 05/12/24  Any of your usual work, housework or school activities 3  2. Usual hobbies, recreational or sporting activities 2  3. Getting into/out of the bath 4  4. Walking between rooms 3  5. Putting on socks/shoes 2  6. Squatting  1  7. Lifting an object, like a bag of groceries from the floor 3  8. Performing light activities around your home 3  9. Performing heavy activities around your home 2  10. Getting into/out of a car 1  11. Walking 2 blocks 2  12. Walking 1 mile 1  13. Going up/down 10 stairs (1  flight) 1  14. Standing for 1 hour 2  15.  sitting for 1 hour 4  16. Running on even ground 1  17. Running on uneven ground 1  18. Making sharp turns while running fast 1  19. Hopping  0  20. Rolling over in bed 2  Score total:  40                                                                                                                                 TREATMENT DATE: 06/17/24    Self-Care    Educated pt's wife to keep an eye on him the next few days and report any unusual behavior d/t his performance today in  PT following his fall. Suggested that they also keep better track of their water intake to prevent dehydration which can cause enhanced muscle weakness, decreased BP, and increase risk of falls.   Physical Performance   Patient demonstrates increased fall risk as noted by score of  38 /56 on Berg Balance Scale.  (<36= high risk for falls, close to 100%; 37-45 significant >80%; 46-51 moderate >50%; 52-55 lower >25%)   Pt performance on Berg today likely limited by fall he had earlier in the day.    [facility] PT Assessment - 06/17/24 0001       Berg Balance Test   Sit to Stand Able to stand without using hands and stabilize independently    Standing Unsupported Able to stand safely 2 minutes    Sitting with Back Unsupported but Feet Supported on Floor or Stool Able to sit safely and securely 2 minutes    Stand to Sit Sits safely with minimal use of hands    Transfers Able to transfer safely, minor use of hands    Standing Unsupported with Eyes Closed Able to stand 3 seconds    Standing Unsupported with Feet Together Able to place feet together independently and stand for 1 minute with supervision    From Standing, Reach Forward with Outstretched Arm Can reach forward >12 cm safely (5)    From Standing Position, Pick up Object from Floor Able to pick up shoe, needs supervision    From Standing Position, Turn to Look Behind Over each Shoulder Looks behind one side only/other  side shows less weight shift    Turn 360 Degrees Able to turn 360 degrees safely but slowly    Standing Unsupported, Alternately Place Feet on Step/Stool Able to complete >2 steps/needs minimal assist    Standing Unsupported, One Foot in Front Needs help to step but can hold 15 seconds    Standing on One Leg Unable to try or needs assist to prevent fall    Total Score 38    Berg comment: Fall today before visit.            TA- To improve functional movements patterns for everyday tasks   Sit<>stand 2x8 holding 5kg med ball - Pt struggled with glute clearance today and would require multiple attempts to complete his STS which he usually completes with ease. Also displayed retropulsion with STS and required Min A from SPT to avoid falling backwards.    Gait Training   Gait with 4# AW - 156ft. VC for high step height.   Unless otherwise stated, CGA was provided and gait belt donned in order to ensure pt safety  Treatment times reflect completion of BBS as well as patients tolerance for activities today given his fall and busy day. PATIENT EDUCATION: Education details: Pt educated throughout session about proper posture and technique with exercises. Improved exercise technique, movement at target joints, use of target muscles after min to mod verbal, visual, tactile cues. Person educated: Patient Education method: Explanation Education comprehension: verbalized understanding   HOME EXERCISE PROGRAM: Verbally added mini squat at bar to replace STS. No new handout provided, has handout from previous PT  Verbally added HS flexion with BTB to HEP. No new handout provided, has handout from previous PT    GOALS: Goals reviewed with patient? Yes  SHORT TERM GOALS: Target date: 06/09/2024   Patient will be independent in home exercise program to improve strength/mobility for better functional independence with ADLs. Baseline: No HEP currently  Goal  status: INITIAL  LONG TERM GOALS:  Target date: 08/04/2024  1.  Patient will complete five times sit to stand test in < 15 seconds without LOB indicating an increased LE strength and improved balance. Baseline: 15.55 sec, imbalance on a few reps requiring CGA for safety Goal status: INITIAL  2.  Patient will improve LEFS score to 50   to demonstrate statistically significant improvement in mobility and quality of life as it relates to their LE strength and mobility.  Baseline: 40 Goal status: INITIAL   3.  Patient will increase Berg Balance score by > 6 points to demonstrate decreased fall risk during functional activities. Baseline: 38 Goal status: INITIAL   4.   Patient will reduce timed up and go to <11 seconds to reduce fall risk and demonstrate improved transfer/gait ability. Baseline: 22.62 sec with SPC  Goal status: INITIAL  5.   Patient will increase 10 meter walk test to >1.55m/s as to improve gait speed for better community ambulation and to reduce fall risk. Baseline: .44 m/s w/ SPC, .73 m/s with rollator Goal status: INITIAL  6.   Patient will increase six minute walk test distance by 150 ft or greater for progression to community ambulator and improve gait ability Baseline: 750 ft, increased shuffling style gait from minute 2 and on as well as increased trunk flexion from this point on as well.  Goal status: INITIAL      ASSESSMENT:  CLINICAL IMPRESSION: Pt presents to clinic in good spirits and motivated for PT. Pt had a fall today while taking the rollator out his trunk. Pt does not remember the fall and does not think he hit his head, no visual signs of head trauma noted on pt and pt reports falling in grass. His wife did not see the fall. Pt's performance in therapy today regressed significantly from his norm AEB his inability to complete the number of STS that he normally can, and requiring multiple attempts to clear glutes from the seat. This could also be related to time of appointment ( in  afternoon compared to normal AM appointments) or due to a worse than average day with his demential. Pt may have been feeling sore after his fall, coupled with what he and his wife describe as an emotionally long and tiring day which led to this performance. Pt will continue to benefit from skilled physical therapy intervention to address impairments, improve QOL, and attain therapy goals.     OBJECTIVE IMPAIRMENTS: Abnormal gait, decreased activity tolerance, decreased balance, decreased endurance, decreased knowledge of condition, decreased knowledge of use of DME, and decreased strength.   ACTIVITY LIMITATIONS: standing, squatting, stairs, transfers, and locomotion level  PARTICIPATION LIMITATIONS: meal prep, shopping, and community activity  PERSONAL FACTORS: Age, Behavior pattern, and 3+ comorbidities: HTN, Chronic Diastolic Heart Failure, post polio,  are also affecting patient's functional outcome.   REHAB POTENTIAL: Good  CLINICAL DECISION MAKING: Evolving/moderate complexity  EVALUATION COMPLEXITY: Moderate  PLAN:  PT FREQUENCY: 2x/week  PT DURATION: 12 weeks  PLANNED INTERVENTIONS: 97750- Physical Performance Testing, 97110-Therapeutic exercises, 97530- Therapeutic activity, [id number]- Neuromuscular re-education, 97535- Self Care, [id number]- Manual therapy, and [id number]- Gait training  PLAN FOR NEXT SESSION:   Progress Hip flexor intervention, endurance and extensors. Initiate static balance interventions reflective of BERG deficits   Casilda Human, SPT   [place]  [facility] 06/17/2024, 2:47 PM

## 2024-06-18 ENCOUNTER — Ambulatory Visit: Admitting: Physical Therapy

## 2024-06-18 ENCOUNTER — Ambulatory Visit: Payer: PPO | Admitting: Physical Therapy

## 2024-06-18 ENCOUNTER — Encounter: Payer: PPO | Admitting: Speech Pathology

## 2024-06-22 ENCOUNTER — Ambulatory Visit: Payer: PPO | Admitting: Physical Therapy

## 2024-06-22 ENCOUNTER — Encounter: Payer: PPO | Admitting: Speech Pathology

## 2024-06-23 ENCOUNTER — Encounter: Payer: Self-pay | Admitting: Physical Therapy

## 2024-06-23 ENCOUNTER — Ambulatory Visit: Admitting: Physical Therapy

## 2024-06-23 DIAGNOSIS — R2681 Unsteadiness on feet: Secondary | ICD-10-CM

## 2024-06-23 DIAGNOSIS — R262 Difficulty in walking, not elsewhere classified: Secondary | ICD-10-CM | POA: Diagnosis not present

## 2024-06-23 DIAGNOSIS — R2689 Other abnormalities of gait and mobility: Secondary | ICD-10-CM

## 2024-06-23 DIAGNOSIS — M6281 Muscle weakness (generalized): Secondary | ICD-10-CM

## 2024-06-23 NOTE — Therapy (Signed)
 OUTPATIENT PHYSICAL THERAPY NEURO TREATMENT   Patient Name: Eugene Martinez. MRN: [id number] DOB:1946/07/04, 78 y.o., male Today's Date: 06/23/2024   PCP: Sadie Manna, MD  REFERRING PROVIDER: Caffaro, Kaitlin T, PA-C   END OF SESSION:  PT End of Session - 06/23/24 1425     Visit Number 12    Number of Visits 25    Date for PT Re-Evaluation 08/04/24    PT Start Time 1615    PT Stop Time 1700    PT Time Calculation (min) 45 min    Equipment Utilized During Treatment Gait belt    Activity Tolerance Patient tolerated treatment well    Behavior During Therapy WFL for tasks assessed/performed                Past Medical History:  Diagnosis Date   Allergic state    Amyloidosis (HCC)    Anemia    BPH (benign prostatic hyperplasia)    DDD (degenerative disc disease), lumbar    Dysplastic nevus 10/13/2013   Left posterior waistline paraspinal. Moderate to severe atypia, pattern borders on early evolving MIS, edge involved. Excised 03/31/2014, margins free.   Dysplastic nevus 12/19/2017   Right upper back medial. Mild atypia, lateral and deep margins involved.    Dysplastic nevus 11/28/2022   right prox lat thigh, Severe atypia, Excised 01/08/23   Dysrhythmia    Environmental and seasonal allergies    Gallop rhythm    GERD (gastroesophageal reflux disease)    Hypertension    Mild mitral regurgitation    a. by echo 10/2012   Pain    bil. 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 10/2012 Echo: EF 55-60%, severe LVH with speckling of myocardium -? amyloid. Mild MR, mod dil LA.   Past Surgical History:  Procedure Laterality Date   CIRCUMCISION  1984   COLONOSCOPY  03/2012   COLONOSCOPY WITH PROPOFOL  N/A 10/29/2017   Procedure: COLONOSCOPY WITH PROPOFOL ;  Surgeon: Gaylyn Gladis PENNER, MD;  Location: [facility] ENDOSCOPY;  Service: Endoscopy;  Laterality: N/A;   FOOT SURGERY     right foot   FOOT SURGERY Right 2017   LEG SURGERIES     HX.OF  MULTIPLE SURGERIES FOR LEG LENGTH DUE TO POLIO   LUMBAR LAMINECTOMY/DECOMPRESSION MICRODISCECTOMY N/A 01/26/2019   Procedure: LUMBAR LAMINECTOMY/DECOMPRESSION MICRODISCECTOMY 1 LEVEL L3-4;  Surgeon: Bluford Standing, MD;  Location: [facility] ORS;  Service: Neurosurgery;  Laterality: N/A;   NECK SURGERY     PILONIDAL CYST / SINUS EXCISION     Patient Active Problem List   Diagnosis Date Noted   Ambulatory dysfunction 10/22/2023   Prediabetes 02/05/2023   Anemia 08/01/2020   Elevated hemoglobin A1c 08/01/2020   Loss of memory 08/01/2020   Sesamoiditis 06/25/2019   S/P lumbar laminectomy 01/26/2019   Weakness of both lower limbs 10/24/2018   LVH (left ventricular hypertrophy) 08/29/2016   Allergy 02/20/2016   Amyloidosis (HCC) 02/20/2016   Thrombocytopenia (HCC) 11/23/2015   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   Airway hyperreactivity 05/07/2013   Benign fibroma of prostate 05/07/2013   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   Chronic diastolic heart failure (HCC) 11/18/2012   Proteinuria, Bence Jones 11/18/2012   Multiple allergies 10/29/2012   Hypertension 10/29/2012   RBBB (right bundle branch block with left anterior fascicular block) 10/29/2012   Proteinuria 10/29/2012    ONSET DATE: 04/22/24  REFERRING DIAG: R26.9 (ICD-10-CM) - Altered gait   THERAPY DIAG:  Difficulty  in walking, not elsewhere classified  Unsteadiness on feet  Muscle weakness (generalized)  Other abnormalities of gait and mobility  Rationale for Evaluation and Treatment: Rehabilitation  SUBJECTIVE:                                                                                                                                                                                             SUBJECTIVE STATEMENT:  Pt reports he is doing good, but complains of the heat. No other falls since last visit.    From Eval:Pt wife report he has been inconsistent with exercise since discharge. She also assist with all of  subjective. Pt has been wall and furniture surfing to get around home. Pt had one pseudo fall in last 6 months; he went on a 4's into the recycling can but could not get up. Pt has not been following trough with his exercise. Pt has been using the cane almost all of the time outside of the house in comparison to rollator.  Patient and caregiver want to work on general strength and mobility. Pt caregiver has some concerns with getting rollator into and out of the car.  As well as getting patient into and out of the car.  Pt accompanied by: significant other  PERTINENT HISTORY: . 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 No  PRECAUTIONS: Fall  RED FLAGS: None   WEIGHT BEARING RESTRICTIONS: No  FALLS: Has patient fallen in last 6 months? 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   COGNITION: Overall cognitive status: Impaired and Mild cogintive impairment     POSTURE: rounded shoulders, forward head, and increased thoracic kyphosis  LOWER EXTREMITY ROM:   WNL for tasks assessed    LOWER EXTREMITY MMT:    MMT Right Eval Left Eval  Hip flexion 4 4  Hip extension    Hip abduction 4+ 4+  Hip adduction 4+ 4+  Hip internal rotation    Hip external rotation    Knee flexion 4- 4-  Knee extension 4 4  Ankle dorsiflexion -   Ankle  plantarflexion -   Ankle inversion    Ankle eversion    (Blank rows = not tested)  BED MOBILITY:  Not tested  TRANSFERS: Sit to stand: Complete Independence and on a few reps noted instability in standing  Assistive device utilized: None     Stand to sit: Complete Independence  Assistive device utilized: None      GAIT: Findings: Gait Characteristics: With cane pt does not use in classic 2 or 3 point pattern but uses sporadically, decreased step length- Right, and decreased step length- Left, Distance walked: 30 ft, Assistive device utilized:Single point cane, Level of assistance: CGA, and Comments: Describing gait with SPC in R UE   FUNCTIONAL TESTS:  5 times sit to stand: 15.55 sec, imbalance on a few Timed up and go (TUG): 22.62 sec with cane  6 minute walk test: Test visit 2  10 meter walk test: 22.67 sec with cane, 13.6 sec with rollator   Berg Balance Scale: Test visit 2    PATIENT SURVEYS:  LEFS  Extreme difficulty/unable (0), Quite a bit of difficulty (1), Moderate difficulty (2), Little difficulty (3), No difficulty (4) Survey date:  WIFE FILLS OUT 05/12/24  Any of your usual work, housework or school activities 3  2. Usual hobbies, recreational or sporting activities 2  3. Getting into/out of the bath 4  4. Walking between rooms 3  5. Putting on socks/shoes 2  6. Squatting  1  7. Lifting an object, like a bag of groceries from the floor 3  8. Performing light activities around your home 3  9. Performing heavy activities around your home 2  10. Getting into/out of a car 1  11. Walking 2 blocks 2  12. Walking 1 mile 1  13. Going up/down 10 stairs (1 flight) 1  14. Standing for 1 hour 2  15.  sitting for 1 hour 4  16. Running on even ground 1  17. Running on uneven ground 1  18. Making sharp turns while running fast 1  19. Hopping  0  20. Rolling over in bed 2  Score total:  40                                                                                                                                  TREATMENT DATE: 06/23/24   TA- To improve functional movements patterns for everyday tasks   Sit<>stand 2x8 holding 5kg med ball - CGA   Standing marches 10# AW B UE support. VC for march height   Gait Training   Gait with 10# AW - 6 min total w seated breaks provided every 2 minutes or every prolonged shuffle from pt. VC for high step height.   // Bar - step ladder c step to gait pattern. B UE support. 2 laps  // bar - step ladder with hurdles and foam rollers. Step to gait.  B UE support. Cues for step height.  Unless otherwise stated, CGA was provided and gait belt donned in order to ensure pt safety   PATIENT EDUCATION: Education details: Pt educated throughout session about proper posture and technique with exercises. Improved exercise technique, movement at target joints, use of target muscles after min to mod verbal, visual, tactile cues. Person educated: Patient Education method: Explanation Education comprehension: verbalized understanding   HOME EXERCISE PROGRAM: Verbally added mini squat at bar to replace STS. No new handout provided, has handout from previous PT  Verbally added HS flexion with BTB to HEP. No new handout provided, has handout from previous PT    GOALS: Goals reviewed with patient? Yes  SHORT TERM GOALS: Target date: 06/09/2024   Patient will be independent in home exercise program to improve strength/mobility for better functional independence with ADLs. Baseline: No HEP currently  Goal status: INITIAL  LONG TERM GOALS: Target date: 08/04/2024  1.  Patient will complete five times sit to stand test in < 15 seconds without LOB indicating an increased LE strength and improved balance. Baseline: 15.55 sec, imbalance on a few reps requiring CGA for safety Goal status: INITIAL  2.  Patient will improve LEFS score to 50   to demonstrate statistically significant improvement in mobility and quality of life as  it relates to their LE strength and mobility.  Baseline: 40 Goal status: INITIAL   3.  Patient will increase Berg Balance score by > 6 points to demonstrate decreased fall risk during functional activities. Baseline: 38 Goal status: INITIAL   4.   Patient will reduce timed up and go to <11 seconds to reduce fall risk and demonstrate improved transfer/gait ability. Baseline: 22.62 sec with SPC  Goal status: INITIAL  5.   Patient will increase 10 meter walk test to >1.72m/s as to improve gait speed for better community ambulation and to reduce fall risk. Baseline: .44 m/s w/ SPC, .73 m/s with rollator Goal status: INITIAL  6.   Patient will increase six minute walk test distance by 150 ft or greater for progression to community ambulator and improve gait ability Baseline: 750 ft, increased shuffling style gait from minute 2 and on as well as increased trunk flexion from this point on as well.  Goal status: INITIAL      ASSESSMENT:  CLINICAL IMPRESSION:  Pt presents to clinic in good spirits and motivated for PT. Pt displayed good strength with STS today, did not exhibit struggles from last session. Pt demo'd great step length and height w step to gait and carryover during gait training with 10# AW on. Pt will continue to benefit from skilled physical therapy intervention to address impairments, improve QOL, and attain therapy goals.       OBJECTIVE IMPAIRMENTS: Abnormal gait, decreased activity tolerance, decreased balance, decreased endurance, decreased knowledge of condition, decreased knowledge of use of DME, and decreased strength.   ACTIVITY LIMITATIONS: standing, squatting, stairs, transfers, and locomotion level  PARTICIPATION LIMITATIONS: meal prep, shopping, and community activity  PERSONAL FACTORS: Age, Behavior pattern, and 3+ comorbidities: HTN, Chronic Diastolic Heart Failure, post polio,  are also affecting patient's functional outcome.   REHAB POTENTIAL:  Good  CLINICAL DECISION MAKING: Evolving/moderate complexity  EVALUATION COMPLEXITY: Moderate  PLAN:  PT FREQUENCY: 2x/week  PT DURATION: 12 weeks  PLANNED INTERVENTIONS: 97750- Physical Performance Testing, 97110-Therapeutic exercises, 97530- Therapeutic activity, [id number]- Neuromuscular re-education, 97535- Self Care, [id number]- Manual therapy, and [id number]- Gait training  PLAN FOR NEXT SESSION:  Progress Hip flexor intervention, endurance and extensors. Initiate static balance interventions reflective of BERG deficits   Casilda Human, SPT    [facility] 06/23/2024, 2:26 PM

## 2024-06-24 ENCOUNTER — Ambulatory Visit: Admitting: Physical Therapy

## 2024-06-25 ENCOUNTER — Ambulatory Visit: Payer: PPO | Admitting: Physical Therapy

## 2024-06-25 ENCOUNTER — Encounter: Payer: PPO | Admitting: Speech Pathology

## 2024-06-26 ENCOUNTER — Ambulatory Visit: Admitting: Physical Therapy

## 2024-06-26 DIAGNOSIS — R2681 Unsteadiness on feet: Secondary | ICD-10-CM

## 2024-06-26 DIAGNOSIS — R262 Difficulty in walking, not elsewhere classified: Secondary | ICD-10-CM | POA: Diagnosis not present

## 2024-06-26 DIAGNOSIS — M6281 Muscle weakness (generalized): Secondary | ICD-10-CM

## 2024-06-26 DIAGNOSIS — R2689 Other abnormalities of gait and mobility: Secondary | ICD-10-CM

## 2024-06-26 NOTE — Therapy (Signed)
 OUTPATIENT PHYSICAL THERAPY NEURO TREATMENT   Patient Name: Eugene Martinez. MRN: [id number] DOB:May 04, 1946, 78 y.o., male Today's Date: 06/26/2024   PCP: Sadie Manna, MD  REFERRING PROVIDER: Caffaro, Kaitlin T, PA-C   END OF SESSION:  PT End of Session - 06/26/24 1026     Visit Number 13    Number of Visits 25    Date for PT Re-Evaluation 08/04/24    Progress Note Due on Visit 20    PT Start Time 1018    PT Stop Time 1058    PT Time Calculation (min) 40 min    Equipment Utilized During Treatment Gait belt    Activity Tolerance Patient tolerated treatment well    Behavior During Therapy WFL for tasks assessed/performed                Past Medical History:  Diagnosis Date   Allergic state    Amyloidosis (HCC)    Anemia    BPH (benign prostatic hyperplasia)    DDD (degenerative disc disease), lumbar    Dysplastic nevus 10/13/2013   Left posterior waistline paraspinal. Moderate to severe atypia, pattern borders on early evolving MIS, edge involved. Excised 03/31/2014, margins free.   Dysplastic nevus 12/19/2017   Right upper back medial. Mild atypia, lateral and deep margins involved.    Dysplastic nevus 11/28/2022   right prox lat thigh, Severe atypia, Excised 01/08/23   Dysrhythmia    Environmental and seasonal allergies    Gallop rhythm    GERD (gastroesophageal reflux disease)    Hypertension    Mild mitral regurgitation    a. by echo 10/2012   Pain    bil. 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 10/2012 Echo: EF 55-60%, severe LVH with speckling of myocardium -? amyloid. Mild MR, mod dil LA.   Past Surgical History:  Procedure Laterality Date   CIRCUMCISION  1984   COLONOSCOPY  03/2012   COLONOSCOPY WITH PROPOFOL  N/A 10/29/2017   Procedure: COLONOSCOPY WITH PROPOFOL ;  Surgeon: Gaylyn Gladis PENNER, MD;  Location: [facility];  Service: Endoscopy;  Laterality: N/A;   FOOT SURGERY     right foot   FOOT SURGERY Right  2017   LEG SURGERIES     HX.OF MULTIPLE SURGERIES FOR LEG LENGTH DUE TO POLIO   LUMBAR LAMINECTOMY/DECOMPRESSION MICRODISCECTOMY N/A 01/26/2019   Procedure: LUMBAR LAMINECTOMY/DECOMPRESSION MICRODISCECTOMY 1 LEVEL L3-4;  Surgeon: Bluford Standing, MD;  Location: [facility] ORS;  Service: Neurosurgery;  Laterality: N/A;   NECK SURGERY     PILONIDAL CYST / SINUS EXCISION     Patient Active Problem List   Diagnosis Date Noted   Ambulatory dysfunction 10/22/2023   Prediabetes 02/05/2023   Anemia 08/01/2020   Elevated hemoglobin A1c 08/01/2020   Loss of memory 08/01/2020   Sesamoiditis 06/25/2019   S/P lumbar laminectomy 01/26/2019   Weakness of both lower limbs 10/24/2018   LVH (left ventricular hypertrophy) 08/29/2016   Allergy 02/20/2016   Amyloidosis (HCC) 02/20/2016   Thrombocytopenia (HCC) 11/23/2015   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   Airway hyperreactivity 05/07/2013   Benign fibroma of prostate 05/07/2013   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   Chronic diastolic heart failure (HCC) 11/18/2012   Proteinuria, Bence Jones 11/18/2012   Multiple allergies 10/29/2012   Hypertension 10/29/2012   RBBB (right bundle branch block with left anterior fascicular block) 10/29/2012   Proteinuria 10/29/2012    ONSET DATE: 04/22/24  REFERRING DIAG: R26.9 (ICD-10-CM) -  Altered gait   THERAPY DIAG:  Difficulty in walking, not elsewhere classified  Unsteadiness on feet  Muscle weakness (generalized)  Other abnormalities of gait and mobility  Rationale for Evaluation and Treatment: Rehabilitation  SUBJECTIVE:                                                                                                                                                                                             SUBJECTIVE STATEMENT:  Pt reports he is doing good, but complains of the heat. No other falls since last visit.    From Eval:Pt wife report he has been inconsistent with exercise since discharge.  She also assist with all of subjective. Pt has been wall and furniture surfing to get around home. Pt had one pseudo fall in last 6 months; he went on a 4's into the recycling can but could not get up. Pt has not been following trough with his exercise. Pt has been using the cane almost all of the time outside of the house in comparison to rollator.  Patient and caregiver want to work on general strength and mobility. Pt caregiver has some concerns with getting rollator into and out of the car.  As well as getting patient into and out of the car.  Pt accompanied by: significant other  PERTINENT HISTORY: . 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 No  PRECAUTIONS: Fall  RED FLAGS: None   WEIGHT BEARING RESTRICTIONS: No  FALLS: Has patient fallen in last 6 months? 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   COGNITION: Overall cognitive status: Impaired and Mild cogintive impairment     POSTURE: rounded shoulders, forward head, and increased thoracic kyphosis  LOWER EXTREMITY ROM:   WNL for tasks assessed    LOWER EXTREMITY MMT:    MMT Right Eval Left Eval  Hip flexion 4 4  Hip extension    Hip abduction 4+ 4+  Hip adduction 4+ 4+  Hip internal rotation    Hip external rotation    Knee  flexion 4- 4-  Knee extension 4 4  Ankle  dorsiflexion -   Ankle plantarflexion -   Ankle inversion    Ankle eversion    (Blank rows = not tested)  BED MOBILITY:  Not tested  TRANSFERS: Sit to stand: Complete Independence and on a few reps noted instability in standing  Assistive device utilized: None     Stand to sit: Complete Independence  Assistive device utilized: None      GAIT: Findings: Gait Characteristics: With cane pt does not use in classic 2 or 3 point pattern but uses sporadically, decreased step length- Right, and decreased step length- Left, Distance walked: 30 ft, Assistive device utilized:Single point cane, Level of assistance: CGA, and Comments: Describing gait with SPC in R UE   FUNCTIONAL TESTS:  5 times sit to stand: 15.55 sec, imbalance on a few Timed up and go (TUG): 22.62 sec with cane  6 minute walk test: Test visit 2  10 meter walk test: 22.67 sec with cane, 13.6 sec with rollator   Berg Balance Scale: Test visit 2    PATIENT SURVEYS:  LEFS  Extreme difficulty/unable (0), Quite a bit of difficulty (1), Moderate difficulty (2), Little difficulty (3), No difficulty (4) Survey date:  WIFE FILLS OUT 05/12/24  Any of your usual work, housework or school activities 3  2. Usual hobbies, recreational or sporting activities 2  3. Getting into/out of the bath 4  4. Walking between rooms 3  5. Putting on socks/shoes 2  6. Squatting  1  7. Lifting an object, like a bag of groceries from the floor 3  8. Performing light activities around your home 3  9. Performing heavy activities around your home 2  10. Getting into/out of a car 1  11. Walking 2 blocks 2  12. Walking 1 mile 1  13. Going up/down 10 stairs (1 flight) 1  14. Standing for 1 hour 2  15.  sitting for 1 hour 4  16. Running on even ground 1  17. Running on uneven ground 1  18. Making sharp turns while running fast 1  19. Hopping  0  20. Rolling over in bed 2  Score total:  40                                                                                                                                  TREATMENT DATE: 06/26/24   TA- To improve functional movements patterns for everyday tasks   Sit<>stand 2x10 holding 3kg med ball - CGA   Step ups 2 x 10 ea LE, heavy cues for sequencing to start  Standing side step over hurdle 2 x 10 ea LE   Standing marches 5# AW B UE support. 2 x 15 reps VC for march height  Gait with 4WW 2 x 320 ft with 5# AW   TE- To improve strength, endurance, mobility, and function of specific targeted  muscle groups or improve joint range of motion or improve muscle flexibility  LAQ 2 x 10 ea LE with 5# AW    PATIENT EDUCATION: Education details: Pt educated throughout session about proper posture and technique with exercises. Improved exercise technique, movement at target joints, use of target muscles after min to mod verbal, visual, tactile cues. Person educated: Patient Education method: Explanation Education comprehension: verbalized understanding   HOME EXERCISE PROGRAM: Verbally added mini squat at bar to replace STS. No new handout provided, has handout from previous PT  Verbally added HS flexion with BTB to HEP. No new handout provided, has handout from previous PT    GOALS: Goals reviewed with patient? Yes  SHORT TERM GOALS: Target date: 06/09/2024   Patient will be independent in home exercise program to improve strength/mobility for better functional independence with ADLs. Baseline: No HEP currently  Goal status: INITIAL  LONG TERM GOALS: Target date: 08/04/2024  1.  Patient will complete five times sit to stand test in < 15 seconds without LOB indicating an increased LE strength and improved balance. Baseline: 15.55 sec, imbalance on a few reps requiring CGA for safety Goal status: INITIAL  2.  Patient will improve LEFS score to 50   to demonstrate statistically significant improvement in mobility and quality of life as it relates to their LE strength and mobility.   Baseline: 40 Goal status: INITIAL   3.  Patient will increase Berg Balance score by > 6 points to demonstrate decreased fall risk during functional activities. Baseline: 38 Goal status: INITIAL   4.   Patient will reduce timed up and go to <11 seconds to reduce fall risk and demonstrate improved transfer/gait ability. Baseline: 22.62 sec with SPC  Goal status: INITIAL  5.   Patient will increase 10 meter walk test to >1.36m/s as to improve gait speed for better community ambulation and to reduce fall risk. Baseline: .44 m/s w/ SPC, .73 m/s with rollator Goal status: INITIAL  6.   Patient will increase six minute walk test distance by 150 ft or greater for progression to community ambulator and improve gait ability Baseline: 750 ft, increased shuffling style gait from minute 2 and on as well as increased trunk flexion from this point on as well.  Goal status: INITIAL      ASSESSMENT:  CLINICAL IMPRESSION:  Pt presents to clinic in good spirits and motivated for PT. Pt progresses with functional movement training this date. Pt has no overt LOB but has difficulties with transfers when not using UE for balance. Pt challenged with stair training and does well but requires cues for sequencing. Will continue to challenge functional strength training in future sessions. Pt will continue to benefit from skilled physical therapy intervention to address impairments, improve QOL, and attain therapy goals.      OBJECTIVE IMPAIRMENTS: Abnormal gait, decreased activity tolerance, decreased balance, decreased endurance, decreased knowledge of condition, decreased knowledge of use of DME, and decreased strength.   ACTIVITY LIMITATIONS: standing, squatting, stairs, transfers, and locomotion level  PARTICIPATION LIMITATIONS: meal prep, shopping, and community activity  PERSONAL FACTORS: Age, Behavior pattern, and 3+ comorbidities: HTN, Chronic Diastolic Heart Failure, post polio,  are also  affecting patient's functional outcome.   REHAB POTENTIAL: Good  CLINICAL DECISION MAKING: Evolving/moderate complexity  EVALUATION COMPLEXITY: Moderate  PLAN:  PT FREQUENCY: 2x/week  PT DURATION: 12 weeks  PLANNED INTERVENTIONS: 97750- Physical Performance Testing, 97110-Therapeutic exercises, 97530- Therapeutic activity, [id number]- Neuromuscular re-education, 97535- Self Care, [id number]- Manual therapy,  and [id number]- Gait training  PLAN FOR NEXT SESSION:   Progress Hip flexor intervention, endurance and extensors. Initiate static balance interventions reflective of BERG deficits   Note: Portions of this document were prepared using Dragon voice recognition software and although reviewed may contain unintentional dictation errors in syntax, grammar, or spelling.  Lonni KATHEE Gainer PT ,DPT Physical Therapist- [facility]   06/26/2024, 10:27 AM

## 2024-06-29 ENCOUNTER — Encounter: Payer: PPO | Admitting: Speech Pathology

## 2024-06-29 ENCOUNTER — Ambulatory Visit: Payer: PPO | Admitting: Physical Therapy

## 2024-06-30 ENCOUNTER — Ambulatory Visit: Admitting: Physical Therapy

## 2024-06-30 ENCOUNTER — Encounter: Payer: Self-pay | Admitting: Physical Therapy

## 2024-06-30 DIAGNOSIS — M6281 Muscle weakness (generalized): Secondary | ICD-10-CM

## 2024-06-30 DIAGNOSIS — R262 Difficulty in walking, not elsewhere classified: Secondary | ICD-10-CM

## 2024-06-30 DIAGNOSIS — R2681 Unsteadiness on feet: Secondary | ICD-10-CM

## 2024-06-30 DIAGNOSIS — R2689 Other abnormalities of gait and mobility: Secondary | ICD-10-CM

## 2024-06-30 NOTE — Therapy (Signed)
 OUTPATIENT PHYSICAL THERAPY NEURO TREATMENT   Patient Name: Eugene Martinez. MRN: [id number] DOB:19-Sep-1946, 78 y.o., male Today's Date: 06/30/2024   PCP: Sadie Manna, MD  REFERRING PROVIDER: Caffaro, Kaitlin T, PA-C   END OF SESSION:  PT End of Session - 06/30/24 1402     Visit Number 14    Number of Visits 25    Date for PT Re-Evaluation 08/04/24    Progress Note Due on Visit 20    PT Start Time 1400    PT Stop Time 1445    PT Time Calculation (min) 45 min    Equipment Utilized During Treatment Gait belt    Activity Tolerance Patient tolerated treatment well    Behavior During Therapy WFL for tasks assessed/performed                Past Medical History:  Diagnosis Date   Allergic state    Amyloidosis (HCC)    Anemia    BPH (benign prostatic hyperplasia)    DDD (degenerative disc disease), lumbar    Dysplastic nevus 10/13/2013   Left posterior waistline paraspinal. Moderate to severe atypia, pattern borders on early evolving MIS, edge involved. Excised 03/31/2014, margins free.   Dysplastic nevus 12/19/2017   Right upper back medial. Mild atypia, lateral and deep margins involved.    Dysplastic nevus 11/28/2022   right prox lat thigh, Severe atypia, Excised 01/08/23   Dysrhythmia    Environmental and seasonal allergies    Gallop rhythm    GERD (gastroesophageal reflux disease)    Hypertension    Mild mitral regurgitation    a. by echo 10/2012   Pain    bil. 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 10/2012 Echo: EF 55-60%, severe LVH with speckling of myocardium -? amyloid. Mild MR, mod dil LA.   Past Surgical History:  Procedure Laterality Date   CIRCUMCISION  1984   COLONOSCOPY  03/2012   COLONOSCOPY WITH PROPOFOL  N/A 10/29/2017   Procedure: COLONOSCOPY WITH PROPOFOL ;  Surgeon: Gaylyn Gladis PENNER, MD;  Location: [facility] ENDOSCOPY;  Service: Endoscopy;  Laterality: N/A;   FOOT SURGERY     right foot   FOOT SURGERY Right  2017   LEG SURGERIES     HX.OF MULTIPLE SURGERIES FOR LEG LENGTH DUE TO POLIO   LUMBAR LAMINECTOMY/DECOMPRESSION MICRODISCECTOMY N/A 01/26/2019   Procedure: LUMBAR LAMINECTOMY/DECOMPRESSION MICRODISCECTOMY 1 LEVEL L3-4;  Surgeon: Bluford Standing, MD;  Location: [facility] ORS;  Service: Neurosurgery;  Laterality: N/A;   NECK SURGERY     PILONIDAL CYST / SINUS EXCISION     Patient Active Problem List   Diagnosis Date Noted   Ambulatory dysfunction 10/22/2023   Prediabetes 02/05/2023   Anemia 08/01/2020   Elevated hemoglobin A1c 08/01/2020   Loss of memory 08/01/2020   Sesamoiditis 06/25/2019   S/P lumbar laminectomy 01/26/2019   Weakness of both lower limbs 10/24/2018   LVH (left ventricular hypertrophy) 08/29/2016   Allergy 02/20/2016   Amyloidosis (HCC) 02/20/2016   Thrombocytopenia (HCC) 11/23/2015   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   Airway hyperreactivity 05/07/2013   Benign fibroma of prostate 05/07/2013   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   Chronic diastolic heart failure (HCC) 11/18/2012   Proteinuria, Bence Jones 11/18/2012   Multiple allergies 10/29/2012   Hypertension 10/29/2012   RBBB (right bundle branch block with left anterior fascicular block) 10/29/2012   Proteinuria 10/29/2012    ONSET DATE: 04/22/24  REFERRING DIAG: R26.9 (ICD-10-CM) -  Altered gait   THERAPY DIAG:  Difficulty in walking, not elsewhere classified  Unsteadiness on feet  Muscle weakness (generalized)  Other abnormalities of gait and mobility  Rationale for Evaluation and Treatment: Rehabilitation  SUBJECTIVE:                                                                                                                                                                                             SUBJECTIVE STATEMENT:  Pt reports he is doing good, no changes since last visit.   From Eval:Pt wife report he has been inconsistent with exercise since discharge. She also assist with all of  subjective. Pt has been wall and furniture surfing to get around home. Pt had one pseudo fall in last 6 months; he went on a 4's into the recycling can but could not get up. Pt has not been following trough with his exercise. Pt has been using the cane almost all of the time outside of the house in comparison to rollator.  Patient and caregiver want to work on general strength and mobility. Pt caregiver has some concerns with getting rollator into and out of the car.  As well as getting patient into and out of the car.  Pt accompanied by: significant other  PERTINENT HISTORY: . 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 No  PRECAUTIONS: Fall  RED FLAGS: None   WEIGHT BEARING RESTRICTIONS: No  FALLS: Has patient fallen in last 6 months? No fall but went to ground intentionally and could not get up   LIVING ENVIRONMENT: LIVING ENVIRONMENT: Lives with: lives with their spouse Lives in: House/apartment Stairs: 1 step into home Has following equipment at home: Retail banker - 2 wheeled *On Arts development officer for [employer]  PLOF: Independent with household mobility with device and Independent with community mobility with device  PATIENT GOALS: Improve balance and mobility   OBJECTIVE:  Note: Objective measures were completed at Evaluation unless otherwise noted.   COGNITION: Overall cognitive status: Impaired and Mild cogintive impairment     POSTURE: rounded shoulders, forward head, and increased thoracic kyphosis  LOWER EXTREMITY ROM:   WNL for tasks assessed    LOWER EXTREMITY MMT:    MMT Right Eval Left Eval  Hip flexion 4 4  Hip extension    Hip abduction 4+ 4+  Hip adduction 4+ 4+  Hip internal rotation    Hip external rotation    Knee flexion 4- 4-  Knee extension 4  4  Ankle dorsiflexion -   Ankle  plantarflexion -   Ankle inversion    Ankle eversion    (Blank rows = not tested)  BED MOBILITY:  Not tested  TRANSFERS: Sit to stand: Complete Independence and on a few reps noted instability in standing  Assistive device utilized: None     Stand to sit: Complete Independence  Assistive device utilized: None      GAIT: Findings: Gait Characteristics: With cane pt does not use in classic 2 or 3 point pattern but uses sporadically, decreased step length- Right, and decreased step length- Left, Distance walked: 30 ft, Assistive device utilized:Single point cane, Level of assistance: CGA, and Comments: Describing gait with SPC in R UE   FUNCTIONAL TESTS:  5 times sit to stand: 15.55 sec, imbalance on a few Timed up and go (TUG): 22.62 sec with cane  6 minute walk test: Test visit 2  10 meter walk test: 22.67 sec with cane, 13.6 sec with rollator   Berg Balance Scale: Test visit 2    PATIENT SURVEYS:  LEFS  Extreme difficulty/unable (0), Quite a bit of difficulty (1), Moderate difficulty (2), Little difficulty (3), No difficulty (4) Survey date:  WIFE FILLS OUT 05/12/24  Any of your usual work, housework or school activities 3  2. Usual hobbies, recreational or sporting activities 2  3. Getting into/out of the bath 4  4. Walking between rooms 3  5. Putting on socks/shoes 2  6. Squatting  1  7. Lifting an object, like a bag of groceries from the floor 3  8. Performing light activities around your home 3  9. Performing heavy activities around your home 2  10. Getting into/out of a car 1  11. Walking 2 blocks 2  12. Walking 1 mile 1  13. Going up/down 10 stairs (1 flight) 1  14. Standing for 1 hour 2  15.  sitting for 1 hour 4  16. Running on even ground 1  17. Running on uneven ground 1  18. Making sharp turns while running fast 1  19. Hopping  0  20. Rolling over in bed 2  Score total:  40                                                                                                                                  TREATMENT DATE: 06/30/24   TA- To improve functional movements patterns for everyday tasks   STS no UE support 2x10 - CGA   Step ups 2 x 10 R LE on step, S UE support   Lateral step up over 6 step - B UE support at safety bar   NMR: To facilitate reeducation of movement, balance, posture, coordination, and/or proprioception/kinesthetic sense.   On Airex:    Marches on Airex - 2x30, B UE support    Normal stance - 2x30 hold, demo's constant retropulsion without UE support despite max verbal  and tactile cueing. Could not maintain static stand without UE support or minA from SPT.   Normal stance - S UE support, asked to spell words out with letters on whiteboard, pt demos increased difficulty with finding letters on whiteboard and spelling words > 5 letters.  Tandem stance on floor - could not maintain with B UE support, 1x20 - minA, significant lateral sway  Gait training    Resisted Gait - FWD/BWD walking in c 7.5# of resistance via harness. X 5 min VC for slower gait on BWD walking.  PATIENT EDUCATION: Education details: Pt educated throughout session about proper posture and technique with exercises. Improved exercise technique, movement at target joints, use of target muscles after min to mod verbal, visual, tactile cues. Person educated: Patient Education method: Explanation Education comprehension: verbalized understanding   HOME EXERCISE PROGRAM: Verbally added mini squat at bar to replace STS. No new handout provided, has handout from previous PT  Verbally added HS flexion with BTB to HEP. No new handout provided, has handout from previous PT    GOALS: Goals reviewed with patient? Yes  SHORT TERM GOALS: Target date: 06/09/2024   Patient will be independent in home exercise program to improve strength/mobility for better functional independence with ADLs. Baseline: No HEP currently  Goal status: INITIAL  LONG TERM  GOALS: Target date: 08/04/2024  1.  Patient will complete five times sit to stand test in < 15 seconds without LOB indicating an increased LE strength and improved balance. Baseline: 15.55 sec, imbalance on a few reps requiring CGA for safety Goal status: INITIAL  2.  Patient will improve LEFS score to 50   to demonstrate statistically significant improvement in mobility and quality of life as it relates to their LE strength and mobility.  Baseline: 40 Goal status: INITIAL   3.  Patient will increase Berg Balance score by > 6 points to demonstrate decreased fall risk during functional activities. Baseline: 38 Goal status: INITIAL   4.   Patient will reduce timed up and go to <11 seconds to reduce fall risk and demonstrate improved transfer/gait ability. Baseline: 22.62 sec with SPC  Goal status: INITIAL  5.   Patient will increase 10 meter walk test to >1.56m/s as to improve gait speed for better community ambulation and to reduce fall risk. Baseline: .44 m/s w/ SPC, .73 m/s with rollator Goal status: INITIAL  6.   Patient will increase six minute walk test distance by 150 ft or greater for progression to community ambulator and improve gait ability Baseline: 750 ft, increased shuffling style gait from minute 2 and on as well as increased trunk flexion from this point on as well.  Goal status: INITIAL      ASSESSMENT:  CLINICAL IMPRESSION:  Pt presents to clinic in good spirits and motivated for PT. Performed static balance interventions today and struggled with maintaining standing without support. Pt demos good strength c resisted walking but needs VC to slow down on BWD walks. Pt struggles significantly c statis balance activities and will benefit from a lower level balance pad (yoga mat, smaller airex) when considering interventions. Pt will continue to benefit from skilled physical therapy intervention to address impairments, improve QOL, and attain therapy goals.       OBJECTIVE IMPAIRMENTS: Abnormal gait, decreased activity tolerance, decreased balance, decreased endurance, decreased knowledge of condition, decreased knowledge of use of DME, and decreased strength.   ACTIVITY LIMITATIONS: standing, squatting, stairs, transfers, and locomotion level  PARTICIPATION LIMITATIONS: meal prep,  shopping, and community activity  PERSONAL FACTORS: Age, Behavior pattern, and 3+ comorbidities: HTN, Chronic Diastolic Heart Failure, post polio,  are also affecting patient's functional outcome.   REHAB POTENTIAL: Good  CLINICAL DECISION MAKING: Evolving/moderate complexity  EVALUATION COMPLEXITY: Moderate  PLAN:  PT FREQUENCY: 2x/week  PT DURATION: 12 weeks  PLANNED INTERVENTIONS: 97750- Physical Performance Testing, 97110-Therapeutic exercises, 97530- Therapeutic activity, [id number]- Neuromuscular re-education, 97535- Self Care, [id number]- Manual therapy, and [id number]- Gait training  PLAN FOR NEXT SESSION:   Progress Hip flexor intervention, endurance and extensors. Initiate static balance interventions reflective of BERG deficits   Note: Portions of this document were prepared using Dragon voice recognition software and although reviewed may contain unintentional dictation errors in syntax, grammar, or spelling.  Casilda Human, SPT   [facility]   06/30/2024, 2:03 PM

## 2024-07-02 ENCOUNTER — Encounter: Payer: PPO | Admitting: Speech Pathology

## 2024-07-02 ENCOUNTER — Ambulatory Visit: Payer: PPO | Admitting: Physical Therapy

## 2024-07-02 ENCOUNTER — Ambulatory Visit: Admitting: Physical Therapy

## 2024-07-02 ENCOUNTER — Encounter: Payer: Self-pay | Admitting: Physical Therapy

## 2024-07-02 DIAGNOSIS — R2689 Other abnormalities of gait and mobility: Secondary | ICD-10-CM

## 2024-07-02 DIAGNOSIS — R2681 Unsteadiness on feet: Secondary | ICD-10-CM

## 2024-07-02 DIAGNOSIS — R262 Difficulty in walking, not elsewhere classified: Secondary | ICD-10-CM | POA: Diagnosis not present

## 2024-07-02 DIAGNOSIS — M6281 Muscle weakness (generalized): Secondary | ICD-10-CM

## 2024-07-02 NOTE — Therapy (Signed)
 OUTPATIENT PHYSICAL THERAPY NEURO TREATMENT   Patient Name: Eugene Martinez. MRN: [id number] DOB:1946-03-24, 78 y.o., male Today's Date: 07/02/2024   PCP: Sadie Manna, MD  REFERRING PROVIDER: Caffaro, Kaitlin T, PA-C   END OF SESSION:  PT End of Session - 07/02/24 1145     Visit Number 15    Number of Visits 25    Date for PT Re-Evaluation 08/04/24    Progress Note Due on Visit 20    PT Start Time 1148    PT Stop Time 1229    PT Time Calculation (min) 41 min    Equipment Utilized During Treatment Gait belt    Activity Tolerance Patient tolerated treatment well    Behavior During Therapy WFL for tasks assessed/performed                Past Medical History:  Diagnosis Date   Allergic state    Amyloidosis (HCC)    Anemia    BPH (benign prostatic hyperplasia)    DDD (degenerative disc disease), lumbar    Dysplastic nevus 10/13/2013   Left posterior waistline paraspinal. Moderate to severe atypia, pattern borders on early evolving MIS, edge involved. Excised 03/31/2014, margins free.   Dysplastic nevus 12/19/2017   Right upper back medial. Mild atypia, lateral and deep margins involved.    Dysplastic nevus 11/28/2022   right prox lat thigh, Severe atypia, Excised 01/08/23   Dysrhythmia    Environmental and seasonal allergies    Gallop rhythm    GERD (gastroesophageal reflux disease)    Hypertension    Mild mitral regurgitation    a. by echo 10/2012   Pain    bil. 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 10/2012 Echo: EF 55-60%, severe LVH with speckling of myocardium -? amyloid. Mild MR, mod dil LA.   Past Surgical History:  Procedure Laterality Date   CIRCUMCISION  1984   COLONOSCOPY  03/2012   COLONOSCOPY WITH PROPOFOL  N/A 10/29/2017   Procedure: COLONOSCOPY WITH PROPOFOL ;  Surgeon: Gaylyn Gladis PENNER, MD;  Location: [facility] ENDOSCOPY;  Service: Endoscopy;  Laterality: N/A;   FOOT SURGERY     right foot   FOOT SURGERY Right  2017   LEG SURGERIES     HX.OF MULTIPLE SURGERIES FOR LEG LENGTH DUE TO POLIO   LUMBAR LAMINECTOMY/DECOMPRESSION MICRODISCECTOMY N/A 01/26/2019   Procedure: LUMBAR LAMINECTOMY/DECOMPRESSION MICRODISCECTOMY 1 LEVEL L3-4;  Surgeon: Bluford Standing, MD;  Location: [facility] ORS;  Service: Neurosurgery;  Laterality: N/A;   NECK SURGERY     PILONIDAL CYST / SINUS EXCISION     Patient Active Problem List   Diagnosis Date Noted   Ambulatory dysfunction 10/22/2023   Prediabetes 02/05/2023   Anemia 08/01/2020   Elevated hemoglobin A1c 08/01/2020   Loss of memory 08/01/2020   Sesamoiditis 06/25/2019   S/P lumbar laminectomy 01/26/2019   Weakness of both lower limbs 10/24/2018   LVH (left ventricular hypertrophy) 08/29/2016   Allergy 02/20/2016   Amyloidosis (HCC) 02/20/2016   Thrombocytopenia (HCC) 11/23/2015   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   Airway hyperreactivity 05/07/2013   Benign fibroma of prostate 05/07/2013   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   Chronic diastolic heart failure (HCC) 11/18/2012   Proteinuria, Bence Jones 11/18/2012   Multiple allergies 10/29/2012   Hypertension 10/29/2012   RBBB (right bundle branch block with left anterior fascicular block) 10/29/2012   Proteinuria 10/29/2012    ONSET DATE: 04/22/24  REFERRING DIAG: R26.9 (ICD-10-CM) -  Altered gait   THERAPY DIAG:  Difficulty in walking, not elsewhere classified  Unsteadiness on feet  Other abnormalities of gait and mobility  Muscle weakness (generalized)  Rationale for Evaluation and Treatment: Rehabilitation  SUBJECTIVE:                                                                                                                                                                                             SUBJECTIVE STATEMENT:  Pt reports he is doing good. Had another fall this week when he was sitting on a chair trying to take his socks off and slid down onto the carpet. Pt and Pt wife reports he  bounced back up and had no injuries from the fall.  From Eval:Pt wife report he has been inconsistent with exercise since discharge. She also assist with all of subjective. Pt has been wall and furniture surfing to get around home. Pt had one pseudo fall in last 6 months; he went on a 4's into the recycling can but could not get up. Pt has not been following trough with his exercise. Pt has been using the cane almost all of the time outside of the house in comparison to rollator.  Patient and caregiver want to work on general strength and mobility. Pt caregiver has some concerns with getting rollator into and out of the car.  As well as getting patient into and out of the car.  Pt accompanied by: significant other  PERTINENT HISTORY: . 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 No  PRECAUTIONS: Fall  RED FLAGS: None   WEIGHT BEARING RESTRICTIONS: No  FALLS: Has patient fallen in last 6 months? 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   COGNITION: Overall cognitive status: Impaired and Mild cogintive impairment     POSTURE: rounded shoulders, forward head, and increased thoracic kyphosis  LOWER EXTREMITY ROM:   WNL for tasks assessed    LOWER EXTREMITY MMT:    MMT Right Eval Left Eval  Hip flexion 4 4  Hip  extension    Hip  abduction 4+ 4+  Hip adduction 4+ 4+  Hip internal rotation    Hip external rotation    Knee flexion 4- 4-  Knee extension 4 4  Ankle dorsiflexion -   Ankle plantarflexion -   Ankle inversion    Ankle eversion    (Blank rows = not tested)  BED MOBILITY:  Not tested  TRANSFERS: Sit to stand: Complete Independence and on a few reps noted instability in standing  Assistive device utilized: None     Stand to sit: Complete Independence  Assistive device utilized: None      GAIT: Findings: Gait Characteristics: With cane pt does not use in classic 2 or 3 point pattern but uses sporadically, decreased step length- Right, and decreased step length- Left, Distance walked: 30 ft, Assistive device utilized:Single point cane, Level of assistance: CGA, and Comments: Describing gait with SPC in R UE   FUNCTIONAL TESTS:  5 times sit to stand: 15.55 sec, imbalance on a few Timed up and go (TUG): 22.62 sec with cane  6 minute walk test: Test visit 2  10 meter walk test: 22.67 sec with cane, 13.6 sec with rollator   Berg Balance Scale: Test visit 2    PATIENT SURVEYS:  LEFS  Extreme difficulty/unable (0), Quite a bit of difficulty (1), Moderate difficulty (2), Little difficulty (3), No difficulty (4) Survey date:  WIFE FILLS OUT 05/12/24  Any of your usual work, housework or school activities 3  2. Usual hobbies, recreational or sporting activities 2  3. Getting into/out of the bath 4  4. Walking between rooms 3  5. Putting on socks/shoes 2  6. Squatting  1  7. Lifting an object, like a bag of groceries from the floor 3  8. Performing light activities around your home 3  9. Performing heavy activities around your home 2  10. Getting into/out of a car 1  11. Walking 2 blocks 2  12. Walking 1 mile 1  13. Going up/down 10 stairs (1 flight) 1  14. Standing for 1 hour 2  15.  sitting for 1 hour 4  16. Running on even ground 1  17. Running on uneven ground 1  18. Making sharp turns while  running fast 1  19. Hopping  0  20. Rolling over in bed 2  Score total:  40                                                                                                                                 TREATMENT DATE: 07/02/24   TE- To improve strength, endurance, mobility, and function of specific targeted muscle groups or improve joint range of motion or improve muscle flexibility   LAQ 2x10 5# AW  NMR: To facilitate reeducation of movement, balance, posture, coordination, and/or proprioception/kinesthetic sense.   On airex w 5# AW on:  Normal stance - S UE use PRN but  encouraged to limit UE use.     Eyes closed 2x30 sec No UE support   Mod Tandem stance - 2 x 30 sec - 1st set eyes open second set eyes closed   On Airex no AW:  Normal stance - B UE PRN, more instances of Posterior LOB   Mod Tandem - B UE PRN, More instances of Posterior LOB  Eyes closed normal stance - More LOB, B UE PRN   CGA - MinA for all NMR interventions  TA- To improve functional movements patterns for everyday tasks     Getting in and out of car - Pt seated in armless chair and instructed on proper transfer technique to get in and out of car. 1/2 foam roller placed on side of chair to mimic real-life sceanario. VC to use arms to help swing legs out of chair. B UE support to perfrom STS from armless chair. SPT provided arm as grab bar for pt to use to mimic car as accurately as possible.  -x several reps and one rep with gait x 10 ft to simulate real scenario    PATIENT EDUCATION: Education details: Pt educated throughout session about proper posture and technique with exercises. Improved exercise technique, movement at target joints, use of target muscles after min to mod verbal, visual, tactile cues. Person educated: Patient Education method: Explanation Education comprehension: verbalized understanding   HOME EXERCISE PROGRAM: Verbally added mini squat at bar to replace STS. No new handout  provided, has handout from previous PT  Verbally added HS flexion with BTB to HEP. No new handout provided, has handout from previous PT    GOALS: Goals reviewed with patient? Yes  SHORT TERM GOALS: Target date: 06/09/2024   Patient will be independent in home exercise program to improve strength/mobility for better functional independence with ADLs. Baseline: No HEP currently  Goal status: INITIAL  LONG TERM GOALS: Target date: 08/04/2024  1.  Patient will complete five times sit to stand test in < 15 seconds without LOB indicating an increased LE strength and improved balance. Baseline: 15.55 sec, imbalance on a few reps requiring CGA for safety Goal status: INITIAL  2.  Patient will improve LEFS score to 50   to demonstrate statistically significant improvement in mobility and quality of life as it relates to their LE strength and mobility.  Baseline: 40 Goal status: INITIAL   3.  Patient will increase Berg Balance score by > 6 points to demonstrate decreased fall risk during functional activities. Baseline: 38 Goal status: INITIAL   4.   Patient will reduce timed up and go to <11 seconds to reduce fall risk and demonstrate improved transfer/gait ability. Baseline: 22.62 sec with SPC  Goal status: INITIAL  5.   Patient will increase 10 meter walk test to >1.53m/s as to improve gait speed for better community ambulation and to reduce fall risk. Baseline: .44 m/s w/ SPC, .73 m/s with rollator Goal status: INITIAL  6.   Patient will increase six minute walk test distance by 150 ft or greater for progression to community ambulator and improve gait ability Baseline: 750 ft, increased shuffling style gait from minute 2 and on as well as increased trunk flexion from this point on as well.  Goal status: INITIAL      ASSESSMENT:  CLINICAL IMPRESSION:  Pt presents to clinic in good spirits and motivated for PT. Performed static balance interventions today and struggled with  maintaining standing without support. Pt did much better maintaining  balance w/out UE support while wearing 5# AW. After taking the AW off, pt displayed many more instances of Posterior LOB and required more use of safety bar and SPT assist to maintain balance. Pt instructed on proper transfer technique for getting in and out of car. Pt was slow to pick up on PT instruction but progressed to being able to transfer slightly easier at the end of the session. Pt will continue to benefit from skilled physical therapy intervention to address impairments, improve QOL, and attain therapy goals.     OBJECTIVE IMPAIRMENTS: Abnormal gait, decreased activity tolerance, decreased balance, decreased endurance, decreased knowledge of condition, decreased knowledge of use of DME, and decreased strength.   ACTIVITY LIMITATIONS: standing, squatting, stairs, transfers, and locomotion level  PARTICIPATION LIMITATIONS: meal prep, shopping, and community activity  PERSONAL FACTORS: Age, Behavior pattern, and 3+ comorbidities: HTN, Chronic Diastolic Heart Failure, post polio,  are also affecting patient's functional outcome.   REHAB POTENTIAL: Good  CLINICAL DECISION MAKING: Evolving/moderate complexity  EVALUATION COMPLEXITY: Moderate  PLAN:  PT FREQUENCY: 2x/week  PT DURATION: 12 weeks  PLANNED INTERVENTIONS: 97750- Physical Performance Testing, 97110-Therapeutic exercises, 97530- Therapeutic activity, [id number]- Neuromuscular re-education, 97535- Self Care, [id number]- Manual therapy, and [id number]- Gait training  PLAN FOR NEXT SESSION:   Progress Hip flexor intervention, endurance and extensors. Initiate static balance interventions reflective of BERG deficits   Note: Portions of this document were prepared using Dragon voice recognition software and although reviewed may contain unintentional dictation errors in syntax, grammar, or spelling.  Casilda Human, SPT   [facility]   07/02/2024,  11:46 AM

## 2024-07-06 ENCOUNTER — Encounter: Payer: Self-pay | Admitting: Physical Therapy

## 2024-07-06 ENCOUNTER — Ambulatory Visit

## 2024-07-06 DIAGNOSIS — R2681 Unsteadiness on feet: Secondary | ICD-10-CM

## 2024-07-06 DIAGNOSIS — M6281 Muscle weakness (generalized): Secondary | ICD-10-CM

## 2024-07-06 DIAGNOSIS — R262 Difficulty in walking, not elsewhere classified: Secondary | ICD-10-CM | POA: Diagnosis not present

## 2024-07-06 DIAGNOSIS — R278 Other lack of coordination: Secondary | ICD-10-CM

## 2024-07-06 DIAGNOSIS — R2689 Other abnormalities of gait and mobility: Secondary | ICD-10-CM

## 2024-07-06 NOTE — Therapy (Signed)
 OUTPATIENT PHYSICAL THERAPY NEURO TREATMENT   Patient Name: Eugene Martinez. MRN: [id number] DOB:13-Aug-1946, 78 y.o., male Today's Date: 07/06/2024   PCP: Sadie Manna, MD  REFERRING PROVIDER: Caffaro, Kaitlin T, PA-C   END OF SESSION:  PT End of Session - 07/06/24 1055     Visit Number 16    Number of Visits 25    Date for PT Re-Evaluation 08/04/24    Progress Note Due on Visit 20    PT Start Time 1055    PT Stop Time 1137    PT Time Calculation (min) 42 min    Equipment Utilized During Treatment Gait belt    Activity Tolerance Patient tolerated treatment well    Behavior During Therapy WFL for tasks assessed/performed                 Past Medical History:  Diagnosis Date   Allergic state    Amyloidosis (HCC)    Anemia    BPH (benign prostatic hyperplasia)    DDD (degenerative disc disease), lumbar    Dysplastic nevus 10/13/2013   Left posterior waistline paraspinal. Moderate to severe atypia, pattern borders on early evolving MIS, edge involved. Excised 03/31/2014, margins free.   Dysplastic nevus 12/19/2017   Right upper back medial. Mild atypia, lateral and deep margins involved.    Dysplastic nevus 11/28/2022   right prox lat thigh, Severe atypia, Excised 01/08/23   Dysrhythmia    Environmental and seasonal allergies    Gallop rhythm    GERD (gastroesophageal reflux disease)    Hypertension    Mild mitral regurgitation    a. by echo 10/2012   Pain    bil. 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 10/2012 Echo: EF 55-60%, severe LVH with speckling of myocardium -? amyloid. Mild MR, mod dil LA.   Past Surgical History:  Procedure Laterality Date   CIRCUMCISION  1984   COLONOSCOPY  03/2012   COLONOSCOPY WITH PROPOFOL  N/A 10/29/2017   Procedure: COLONOSCOPY WITH PROPOFOL ;  Surgeon: Gaylyn Gladis PENNER, MD;  Location: [facility] ENDOSCOPY;  Service: Endoscopy;  Laterality: N/A;   FOOT SURGERY     right foot   FOOT SURGERY Right  2017   LEG SURGERIES     HX.OF MULTIPLE SURGERIES FOR LEG LENGTH DUE TO POLIO   LUMBAR LAMINECTOMY/DECOMPRESSION MICRODISCECTOMY N/A 01/26/2019   Procedure: LUMBAR LAMINECTOMY/DECOMPRESSION MICRODISCECTOMY 1 LEVEL L3-4;  Surgeon: Bluford Standing, MD;  Location: [facility] ORS;  Service: Neurosurgery;  Laterality: N/A;   NECK SURGERY     PILONIDAL CYST / SINUS EXCISION     Patient Active Problem List   Diagnosis Date Noted   Ambulatory dysfunction 10/22/2023   Prediabetes 02/05/2023   Anemia 08/01/2020   Elevated hemoglobin A1c 08/01/2020   Loss of memory 08/01/2020   Sesamoiditis 06/25/2019   S/P lumbar laminectomy 01/26/2019   Weakness of both lower limbs 10/24/2018   LVH (left ventricular hypertrophy) 08/29/2016   Allergy 02/20/2016   Amyloidosis (HCC) 02/20/2016   Thrombocytopenia (HCC) 11/23/2015   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   Airway hyperreactivity 05/07/2013   Benign fibroma of prostate 05/07/2013   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   Chronic diastolic heart failure (HCC) 11/18/2012   Proteinuria, Bence Jones 11/18/2012   Multiple allergies 10/29/2012   Hypertension 10/29/2012   RBBB (right bundle branch block with left anterior fascicular block) 10/29/2012   Proteinuria 10/29/2012    ONSET DATE: 04/22/24  REFERRING DIAG: R26.9 (  ICD-10-CM) - Altered gait   THERAPY DIAG:  Difficulty in walking, not elsewhere classified  Unsteadiness on feet  Other abnormalities of gait and mobility  Muscle weakness (generalized)  Other lack of coordination  Rationale for Evaluation and Treatment: Rehabilitation  SUBJECTIVE:                                                                                                                                                                                             SUBJECTIVE STATEMENT:   Patient and wife deny any falls since last session. His wife reports he has been doing the exercises when she tells him to.  From Eval:Pt wife  report he has been inconsistent with exercise since discharge. She also assist with all of subjective. Pt has been wall and furniture surfing to get around home. Pt had one pseudo fall in last 6 months; he went on a 4's into the recycling can but could not get up. Pt has not been following trough with his exercise. Pt has been using the cane almost all of the time outside of the house in comparison to rollator.  Patient and caregiver want to work on general strength and mobility. Pt caregiver has some concerns with getting rollator into and out of the car.  As well as getting patient into and out of the car.  Pt accompanied by: significant other  PERTINENT HISTORY: . 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 No  PRECAUTIONS: Fall  RED FLAGS: None   WEIGHT BEARING RESTRICTIONS: No  FALLS: Has patient fallen in last 6 months? 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   COGNITION: Overall cognitive status: Impaired and Mild cogintive impairment     POSTURE: rounded shoulders, forward head, and increased thoracic kyphosis  LOWER EXTREMITY ROM:   WNL for tasks assessed    LOWER EXTREMITY MMT:    MMT Right Eval Left Eval  Hip flexion 4 4  Hip extension    Hip abduction 4+ 4+  Hip adduction 4+ 4+  Hip  internal rotation    Hip  external rotation    Knee flexion 4- 4-  Knee extension 4 4  Ankle dorsiflexion -   Ankle plantarflexion -   Ankle inversion    Ankle eversion    (Blank rows = not tested)  BED MOBILITY:  Not tested  TRANSFERS: Sit to stand: Complete Independence and on a few reps noted instability in standing  Assistive device utilized: None     Stand to sit: Complete Independence  Assistive device utilized: None      GAIT: Findings: Gait Characteristics: With cane pt does not use in classic 2 or 3 point pattern but uses sporadically, decreased step length- Right, and decreased step length- Left, Distance walked: 30 ft, Assistive device utilized:Single point cane, Level of assistance: CGA, and Comments: Describing gait with SPC in R UE   FUNCTIONAL TESTS:  5 times sit to stand: 15.55 sec, imbalance on a few Timed up and go (TUG): 22.62 sec with cane  6 minute walk test: Test visit 2  10 meter walk test: 22.67 sec with cane, 13.6 sec with rollator   Berg Balance Scale: Test visit 2    PATIENT SURVEYS:  LEFS  Extreme difficulty/unable (0), Quite a bit of difficulty (1), Moderate difficulty (2), Little difficulty (3), No difficulty (4) Survey date:  WIFE FILLS OUT 05/12/24  Any of your usual work, housework or school activities 3  2. Usual hobbies, recreational or sporting activities 2  3. Getting into/out of the bath 4  4. Walking between rooms 3  5. Putting on socks/shoes 2  6. Squatting  1  7. Lifting an object, like a bag of groceries from the floor 3  8. Performing light activities around your home 3  9. Performing heavy activities around your home 2  10. Getting into/out of a car 1  11. Walking 2 blocks 2  12. Walking 1 mile 1  13. Going up/down 10 stairs (1 flight) 1  14. Standing for 1 hour 2  15.  sitting for 1 hour 4  16. Running on even ground 1  17. Running on uneven ground 1  18. Making sharp turns while running fast 1  19. Hopping  0  20. Rolling over in bed 2  Score  total:  40                                                                                                                                 TREATMENT DATE: 07/06/24    Unless otherwise stated, CGA was provided and gait belt donned in order to ensure pt safety  TA- To improve functional movements patterns for everyday tasks   Nustep level 2 x 6 minutes to improve cardiorespiratory endurance and BLE strengthening   Sit<>stand 2x10 holding 3kg med ball overhead press- CGA    Step taps with 5# AW 2 x 10 ea LE, heavy cues for sequencing to start  Standing side step over hurdle with 5# AW 2 x 10 ea LE    Standing marches 5# AW B UE support. x 15 reps VC for march height  Standing hip extension with 5# AW B UE support x 15 each LE    Gait with 4WW 2 x 320 ft with 5# AW     PATIENT EDUCATION: Education details: Pt educated throughout session about proper posture and technique with exercises. Improved exercise technique, movement at target joints, use of target muscles after min to mod verbal, visual, tactile cues. Person educated: Patient Education method: Explanation Education comprehension: verbalized understanding   HOME EXERCISE PROGRAM: Verbally added mini squat at bar to replace STS. No new handout provided, has handout from previous PT  Verbally added HS flexion with BTB to HEP. No new handout provided, has handout from previous PT    GOALS: Goals reviewed with patient? Yes  SHORT TERM GOALS: Target date: 06/09/2024   Patient will be independent in home exercise program to improve strength/mobility for better functional independence with ADLs. Baseline: No HEP currently  Goal status: INITIAL  LONG TERM GOALS: Target date: 08/04/2024  1.  Patient will complete five times sit to stand test in < 15 seconds without LOB indicating an increased LE strength and improved balance. Baseline: 15.55 sec, imbalance on a few reps requiring CGA for safety Goal status: INITIAL  2.   Patient will improve LEFS score to 50   to demonstrate statistically significant improvement in mobility and quality of life as it relates to their LE strength and mobility.  Baseline: 40 Goal status: INITIAL   3.  Patient will increase Berg Balance score by > 6 points to demonstrate decreased fall risk during functional activities. Baseline: 38 Goal status: INITIAL   4.   Patient will reduce timed up and go to <11 seconds to reduce fall risk and demonstrate improved transfer/gait ability. Baseline: 22.62 sec with SPC  Goal status: INITIAL  5.   Patient will increase 10 meter walk test to >1.4m/s as to improve gait speed for better community ambulation and to reduce fall risk. Baseline: .44 m/s w/ SPC, .73 m/s with rollator Goal status: INITIAL  6.   Patient will increase six minute walk test distance by 150 ft or greater for progression to community ambulator and improve gait ability Baseline: 750 ft, increased shuffling style gait from minute 2 and on as well as increased trunk flexion from this point on as well.  Goal status: INITIAL    ASSESSMENT:  CLINICAL IMPRESSION:    Pt presents to clinic in good spirits and motivated for PT. Session focused on BLE strengthening and dynamic balance. Continues to require at least single UE support with balance activities due to balance deficits. Pt will continue to benefit from skilled physical therapy intervention to address impairments, improve QOL, and attain therapy goals.     OBJECTIVE IMPAIRMENTS: Abnormal gait, decreased activity tolerance, decreased balance, decreased endurance, decreased knowledge of condition, decreased knowledge of use of DME, and decreased strength.   ACTIVITY LIMITATIONS: standing, squatting, stairs, transfers, and locomotion level  PARTICIPATION LIMITATIONS: meal prep, shopping, and community activity  PERSONAL FACTORS: Age, Behavior pattern, and 3+ comorbidities: HTN, Chronic Diastolic Heart Failure, post  polio,  are also affecting patient's functional outcome.   REHAB POTENTIAL: Good  CLINICAL DECISION MAKING: Evolving/moderate complexity  EVALUATION COMPLEXITY: Moderate  PLAN:  PT FREQUENCY: 2x/week  PT DURATION: 12 weeks  PLANNED INTERVENTIONS: 97750- Physical Performance Testing, 97110-Therapeutic exercises, 97530-  Therapeutic activity, [id number]- Neuromuscular re-education, [id number]- Self Care, [id number]- Manual therapy, and [id number]- Gait training  PLAN FOR NEXT SESSION:   Progress Hip flexor intervention, endurance and extensors. Initiate static balance interventions reflective of BERG deficits   Maryanne Finder, PT, DPT Physical Therapist - [facility]  07/06/2024, 10:55 AM

## 2024-07-08 ENCOUNTER — Ambulatory Visit: Admitting: Physical Therapy

## 2024-07-08 DIAGNOSIS — R262 Difficulty in walking, not elsewhere classified: Secondary | ICD-10-CM | POA: Diagnosis not present

## 2024-07-08 DIAGNOSIS — R2681 Unsteadiness on feet: Secondary | ICD-10-CM

## 2024-07-08 DIAGNOSIS — R2689 Other abnormalities of gait and mobility: Secondary | ICD-10-CM

## 2024-07-08 DIAGNOSIS — M6281 Muscle weakness (generalized): Secondary | ICD-10-CM

## 2024-07-08 NOTE — Therapy (Signed)
 OUTPATIENT PHYSICAL THERAPY NEURO TREATMENT   Patient Name: Eugene Martinez. MRN: [id number] DOB:08-04-1946, 78 y.o., male Today's Date: 07/08/2024   PCP: Sadie Manna, MD  REFERRING PROVIDER: Abe Erlinda DASEN, PA-C   END OF SESSION:  PT End of Session - 07/08/24 1312     Visit Number 17    Number of Visits 25    Date for PT Re-Evaluation 08/04/24    Progress Note Due on Visit 20    PT Start Time 1316    PT Stop Time 1357    PT Time Calculation (min) 41 min    Equipment Utilized During Treatment Gait belt    Activity Tolerance Patient tolerated treatment well    Behavior During Therapy WFL for tasks assessed/performed                  Past Medical History:  Diagnosis Date   Allergic state    Amyloidosis (HCC)    Anemia    BPH (benign prostatic hyperplasia)    DDD (degenerative disc disease), lumbar    Dysplastic nevus 10/13/2013   Left posterior waistline paraspinal. Moderate to severe atypia, pattern borders on early evolving MIS, edge involved. Excised 03/31/2014, margins free.   Dysplastic nevus 12/19/2017   Right upper back medial. Mild atypia, lateral and deep margins involved.    Dysplastic nevus 11/28/2022   right prox lat thigh, Severe atypia, Excised 01/08/23   Dysrhythmia    Environmental and seasonal allergies    Gallop rhythm    GERD (gastroesophageal reflux disease)    Hypertension    Mild mitral regurgitation    a. by echo 10/2012   Pain    bil. 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 10/2012 Echo: EF 55-60%, severe LVH with speckling of myocardium -? amyloid. Mild MR, mod dil LA.   Past Surgical History:  Procedure Laterality Date   CIRCUMCISION  1984   COLONOSCOPY  03/2012   COLONOSCOPY WITH PROPOFOL  N/A 10/29/2017   Procedure: COLONOSCOPY WITH PROPOFOL ;  Surgeon: Gaylyn Gladis PENNER, MD;  Location: [facility] ENDOSCOPY;  Service: Endoscopy;  Laterality: N/A;   FOOT SURGERY     right foot   FOOT SURGERY  Right 2017   LEG SURGERIES     HX.OF MULTIPLE SURGERIES FOR LEG LENGTH DUE TO POLIO   LUMBAR LAMINECTOMY/DECOMPRESSION MICRODISCECTOMY N/A 01/26/2019   Procedure: LUMBAR LAMINECTOMY/DECOMPRESSION MICRODISCECTOMY 1 LEVEL L3-4;  Surgeon: Bluford Standing, MD;  Location: [facility] ORS;  Service: Neurosurgery;  Laterality: N/A;   NECK SURGERY     PILONIDAL CYST / SINUS EXCISION     Patient Active Problem List   Diagnosis Date Noted   Ambulatory dysfunction 10/22/2023   Prediabetes 02/05/2023   Anemia 08/01/2020   Elevated hemoglobin A1c 08/01/2020   Loss of memory 08/01/2020   Sesamoiditis 06/25/2019   S/P lumbar laminectomy 01/26/2019   Weakness of both lower limbs 10/24/2018   LVH (left ventricular hypertrophy) 08/29/2016   Allergy 02/20/2016   Amyloidosis (HCC) 02/20/2016   Thrombocytopenia (HCC) 11/23/2015   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   Airway hyperreactivity 05/07/2013   Benign fibroma of prostate 05/07/2013   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   Chronic diastolic heart failure (HCC) 11/18/2012   Proteinuria, Bence Jones 11/18/2012   Multiple allergies 10/29/2012   Hypertension 10/29/2012   RBBB (right bundle branch block with left anterior fascicular block) 10/29/2012   Proteinuria 10/29/2012    ONSET DATE: 04/22/24  REFERRING DIAG:  R26.9 (ICD-10-CM) - Altered gait   THERAPY DIAG:  Difficulty in walking, not elsewhere classified  Unsteadiness on feet  Other abnormalities of gait and mobility  Muscle weakness (generalized)  Rationale for Evaluation and Treatment: Rehabilitation  SUBJECTIVE:                                                                                                                                                                                             SUBJECTIVE STATEMENT:   Patient and wife deny any falls since last session.   From Eval:Pt wife report he has been inconsistent with exercise since discharge. She also assist with all of  subjective. Pt has been wall and furniture surfing to get around home. Pt had one pseudo fall in last 6 months; he went on a 4's into the recycling can but could not get up. Pt has not been following trough with his exercise. Pt has been using the cane almost all of the time outside of the house in comparison to rollator.  Patient and caregiver want to work on general strength and mobility. Pt caregiver has some concerns with getting rollator into and out of the car.  As well as getting patient into and out of the car.  Pt accompanied by: significant other  PERTINENT HISTORY: . 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 No  PRECAUTIONS: Fall  RED FLAGS: None   WEIGHT BEARING RESTRICTIONS: No  FALLS: Has patient fallen in last 6 months? No fall but went to ground intentionally and could not get up   LIVING ENVIRONMENT: LIVING ENVIRONMENT: Lives with: lives with their spouse Lives in: House/apartment Stairs: 1 step into home Has following equipment at home: Retail banker - 2 wheeled *On Arts development officer for [employer] at [employer]  PLOF: Independent with household mobility with device and Independent with community mobility with device  PATIENT GOALS: Improve balance and mobility   OBJECTIVE:  Note: Objective measures were completed at Evaluation unless otherwise noted.   COGNITION: Overall cognitive status: Impaired and Mild cogintive impairment     POSTURE: rounded shoulders, forward head, and increased thoracic kyphosis  LOWER EXTREMITY ROM:   WNL for tasks assessed    LOWER EXTREMITY MMT:    MMT Right Eval Left Eval  Hip flexion 4 4  Hip extension    Hip abduction 4+ 4+  Hip adduction 4+ 4+  Hip internal rotation    Hip external rotation    Knee flexion 4- 4-  Knee  extension 4 4  Ankle dorsiflexion -   Ankle  plantarflexion -   Ankle inversion    Ankle eversion    (Blank rows = not tested)  BED MOBILITY:  Not tested  TRANSFERS: Sit to stand: Complete Independence and on a few reps noted instability in standing  Assistive device utilized: None     Stand to sit: Complete Independence  Assistive device utilized: None      GAIT: Findings: Gait Characteristics: With cane pt does not use in classic 2 or 3 point pattern but uses sporadically, decreased step length- Right, and decreased step length- Left, Distance walked: 30 ft, Assistive device utilized:Single point cane, Level of assistance: CGA, and Comments: Describing gait with SPC in R UE   FUNCTIONAL TESTS:  5 times sit to stand: 15.55 sec, imbalance on a few Timed up and go (TUG): 22.62 sec with cane  6 minute walk test: Test visit 2  10 meter walk test: 22.67 sec with cane, 13.6 sec with rollator   Berg Balance Scale: Test visit 2    PATIENT SURVEYS:  LEFS  Extreme difficulty/unable (0), Quite a bit of difficulty (1), Moderate difficulty (2), Little difficulty (3), No difficulty (4) Survey date:  WIFE FILLS OUT 05/12/24  Any of your usual work, housework or school activities 3  2. Usual hobbies, recreational or sporting activities 2  3. Getting into/out of the bath 4  4. Walking between rooms 3  5. Putting on socks/shoes 2  6. Squatting  1  7. Lifting an object, like a bag of groceries from the floor 3  8. Performing light activities around your home 3  9. Performing heavy activities around your home 2  10. Getting into/out of a car 1  11. Walking 2 blocks 2  12. Walking 1 mile 1  13. Going up/down 10 stairs (1 flight) 1  14. Standing for 1 hour 2  15.  sitting for 1 hour 4  16. Running on even ground 1  17. Running on uneven ground 1  18. Making sharp turns while running fast 1  19. Hopping  0  20. Rolling over in bed 2  Score total:  40                                                                                                                                  TREATMENT DATE: 07/08/24    Unless otherwise stated, CGA was provided and gait belt donned in order to ensure pt safety  TA- To improve functional movements patterns for everyday tasks   Gait with 5# AW x 320 ft with 4WW  Sit<>stand x10 holding 3kg med - CGA    Gait with 5# AW x 320 ft with 5TT  Sit<>stand x10 holding 3kg med ball - CGA   Step up x 10 ea LE with uni UE support   NMR: To facilitate reeducation of movement, balance, posture, coordination, and/or  proprioception/kinesthetic sense.  Activity Description: ant and lateral step taps with 5# AW donned and UE support, third and fourth rounds pt completes with dual task of naming school that has color, much slowed processing and difficulty with completion of activity with dual task cognitive challenge  Activity Setting:  The Blaze Pod Random setting was chosen to enhance cognitive processing and agility, providing an unpredictable environment to simulate real-world scenarios, and fostering quick reactions and adaptability.   Number of Pods:  4 Cycles/Sets:  4 Duration (Time or Hit Count):  1 min ea round  Comments: third and fourth rounds pt completes with dual task of naming college/school that has that blaze pod color, much slowed processing and difficulty with completion of activity with dual task cognitive challenge       PATIENT EDUCATION: Education details: Pt educated throughout session about proper posture and technique with exercises. Improved exercise technique, movement at target joints, use of target muscles after min to mod verbal, visual, tactile cues. Person educated: Patient Education method: Explanation Education comprehension: verbalized understanding   HOME EXERCISE PROGRAM: Verbally added mini squat at bar to replace STS. No new handout provided, has handout from previous PT  Verbally added HS flexion with BTB to HEP. No new handout provided, has handout  from previous PT    GOALS: Goals reviewed with patient? Yes  SHORT TERM GOALS: Target date: 06/09/2024   Patient will be independent in home exercise program to improve strength/mobility for better functional independence with ADLs. Baseline: No HEP currently  Goal status: INITIAL  LONG TERM GOALS: Target date: 08/04/2024  1.  Patient will complete five times sit to stand test in < 15 seconds without LOB indicating an increased LE strength and improved balance. Baseline: 15.55 sec, imbalance on a few reps requiring CGA for safety Goal status: INITIAL  2.  Patient will improve LEFS score to 50   to demonstrate statistically significant improvement in mobility and quality of life as it relates to their LE strength and mobility.  Baseline: 40 Goal status: INITIAL   3.  Patient will increase Berg Balance score by > 6 points to demonstrate decreased fall risk during functional activities. Baseline: 38 Goal status: INITIAL   4.   Patient will reduce timed up and go to <11 seconds to reduce fall risk and demonstrate improved transfer/gait ability. Baseline: 22.62 sec with SPC  Goal status: INITIAL  5.   Patient will increase 10 meter walk test to >1.19m/s as to improve gait speed for better community ambulation and to reduce fall risk. Baseline: .44 m/s w/ SPC, .73 m/s with rollator Goal status: INITIAL  6.   Patient will increase six minute walk test distance by 150 ft or greater for progression to community ambulator and improve gait ability Baseline: 750 ft, increased shuffling style gait from minute 2 and on as well as increased trunk flexion from this point on as well.  Goal status: INITIAL    ASSESSMENT:  CLINICAL IMPRESSION:    Patient arrived with good motivation for completion of pt activities. The patient demonstrated progress while utilizing [employer], showcasing improved coordination, balance, and cognitive function. The incorporation of dual-tasking technology with  color recognition and association with specific movements in Blaze Pods was strategically chosen to provide a dynamic training environment, enabling the patient to engage in simultaneous physical and cognitive tasks. This unique approach enhances not only their physical abilities but also fosters increased neural connectivity and mental awareness, contributing to a well-rounded and  effective rehabilitation and training experience. Pt had difficulty with dual task cognitive component when integrated. Pt also having difficulty with transfers still despinte frequent practice and interventions. Pt will continue to benefit from skilled physical therapy intervention to address impairments, improve QOL, and attain therapy goals.      OBJECTIVE IMPAIRMENTS: Abnormal gait, decreased activity tolerance, decreased balance, decreased endurance, decreased knowledge of condition, decreased knowledge of use of DME, and decreased strength.   ACTIVITY LIMITATIONS: standing, squatting, stairs, transfers, and locomotion level  PARTICIPATION LIMITATIONS: meal prep, shopping, and community activity  PERSONAL FACTORS: Age, Behavior pattern, and 3+ comorbidities: HTN, Chronic Diastolic Heart Failure, post polio,  are also affecting patient's functional outcome.   REHAB POTENTIAL: Good  CLINICAL DECISION MAKING: Evolving/moderate complexity  EVALUATION COMPLEXITY: Moderate  PLAN:  PT FREQUENCY: 2x/week  PT DURATION: 12 weeks  PLANNED INTERVENTIONS: 97750- Physical Performance Testing, 97110-Therapeutic exercises, 97530- Therapeutic activity, [id number]- Neuromuscular re-education, 97535- Self Care, [id number]- Manual therapy, and [id number]- Gait training  PLAN FOR NEXT SESSION:   Progress Hip flexor intervention, endurance and extensors. Initiate static balance interventions reflective of BERG deficits   Note: Portions of this document were prepared using Dragon voice recognition software and although reviewed may contain  unintentional dictation errors in syntax, grammar, or spelling.  Lonni KATHEE Gainer PT ,DPT Physical Therapist- [place]  [facility]    07/08/2024, 1:15 PM

## 2024-07-13 ENCOUNTER — Ambulatory Visit: Attending: Internal Medicine | Admitting: Physical Therapy

## 2024-07-13 DIAGNOSIS — R262 Difficulty in walking, not elsewhere classified: Secondary | ICD-10-CM | POA: Insufficient documentation

## 2024-07-13 DIAGNOSIS — M6281 Muscle weakness (generalized): Secondary | ICD-10-CM | POA: Diagnosis not present

## 2024-07-13 DIAGNOSIS — R2681 Unsteadiness on feet: Secondary | ICD-10-CM | POA: Insufficient documentation

## 2024-07-13 DIAGNOSIS — R278 Other lack of coordination: Secondary | ICD-10-CM | POA: Diagnosis not present

## 2024-07-13 DIAGNOSIS — R2689 Other abnormalities of gait and mobility: Secondary | ICD-10-CM | POA: Insufficient documentation

## 2024-07-13 NOTE — Therapy (Signed)
 OUTPATIENT PHYSICAL THERAPY NEURO TREATMENT   Patient Name: Eugene Martinez. MRN: [id number] DOB:06-Oct-1946, 78 y.o., male Today's Date: 07/13/2024   PCP: Sadie Manna, MD  REFERRING PROVIDER: Caffaro, Kaitlin T, PA-C   END OF SESSION:  PT End of Session - 07/13/24 1327     Visit Number 18    Number of Visits 25    Date for PT Re-Evaluation 08/04/24    Progress Note Due on Visit 20    PT Start Time 1320    PT Stop Time 1400    PT Time Calculation (min) 40 min    Equipment Utilized During Treatment Gait belt    Activity Tolerance Patient tolerated treatment well    Behavior During Therapy WFL for tasks assessed/performed                  Past Medical History:  Diagnosis Date   Allergic state    Amyloidosis (HCC)    Anemia    BPH (benign prostatic hyperplasia)    DDD (degenerative disc disease), lumbar    Dysplastic nevus 10/13/2013   Left posterior waistline paraspinal. Moderate to severe atypia, pattern borders on early evolving MIS, edge involved. Excised 03/31/2014, margins free.   Dysplastic nevus 12/19/2017   Right upper back medial. Mild atypia, lateral and deep margins involved.    Dysplastic nevus 11/28/2022   right prox lat thigh, Severe atypia, Excised 01/08/23   Dysrhythmia    Environmental and seasonal allergies    Gallop rhythm    GERD (gastroesophageal reflux disease)    Hypertension    Mild mitral regurgitation    a. by echo 10/2012   Pain    bil. 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 10/2012 Echo: EF 55-60%, severe LVH with speckling of myocardium -? amyloid. Mild MR, mod dil LA.   Past Surgical History:  Procedure Laterality Date   CIRCUMCISION  1984   COLONOSCOPY  03/2012   COLONOSCOPY WITH PROPOFOL  N/A 10/29/2017   Procedure: COLONOSCOPY WITH PROPOFOL ;  Surgeon: Gaylyn Gladis PENNER, MD;  Location: [facility] ENDOSCOPY;  Service: Endoscopy;  Laterality: N/A;   FOOT SURGERY     right foot   FOOT SURGERY Right  2017   LEG SURGERIES     HX.OF MULTIPLE SURGERIES FOR LEG LENGTH DUE TO POLIO   LUMBAR LAMINECTOMY/DECOMPRESSION MICRODISCECTOMY N/A 01/26/2019   Procedure: LUMBAR LAMINECTOMY/DECOMPRESSION MICRODISCECTOMY 1 LEVEL L3-4;  Surgeon: Bluford Standing, MD;  Location: [facility] ORS;  Service: Neurosurgery;  Laterality: N/A;   NECK SURGERY     PILONIDAL CYST / SINUS EXCISION     Patient Active Problem List   Diagnosis Date Noted   Ambulatory dysfunction 10/22/2023   Prediabetes 02/05/2023   Anemia 08/01/2020   Elevated hemoglobin A1c 08/01/2020   Loss of memory 08/01/2020   Sesamoiditis 06/25/2019   S/P lumbar laminectomy 01/26/2019   Weakness of both lower limbs 10/24/2018   LVH (left ventricular hypertrophy) 08/29/2016   Allergy 02/20/2016   Amyloidosis (HCC) 02/20/2016   Thrombocytopenia (HCC) 11/23/2015   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   Airway hyperreactivity 05/07/2013   Benign fibroma of prostate 05/07/2013   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   Chronic diastolic heart failure (HCC) 11/18/2012   Proteinuria, Bence Jones 11/18/2012   Multiple allergies 10/29/2012   Hypertension 10/29/2012   RBBB (right bundle branch block with left anterior fascicular block) 10/29/2012   Proteinuria 10/29/2012    ONSET DATE: 04/22/24  REFERRING DIAG:  R26.9 (ICD-10-CM) - Altered gait   THERAPY DIAG:  Difficulty in walking, not elsewhere classified  Unsteadiness on feet  Other abnormalities of gait and mobility  Muscle weakness (generalized)  Other lack of coordination  Rationale for Evaluation and Treatment: Rehabilitation  SUBJECTIVE:                                                                                                                                                                                             SUBJECTIVE STATEMENT:   Patient and wife deny any falls since last session. States that they had a busy weekend, going to a family reunion. No medical updates.    From  Eval:Pt wife report he has been inconsistent with exercise since discharge. She also assist with all of subjective. Pt has been wall and furniture surfing to get around home. Pt had one pseudo fall in last 6 months; he went on a 4's into the recycling can but could not get up. Pt has not been following trough with his exercise. Pt has been using the cane almost all of the time outside of the house in comparison to rollator.  Patient and caregiver want to work on general strength and mobility. Pt caregiver has some concerns with getting rollator into and out of the car.  As well as getting patient into and out of the car.  Pt accompanied by: significant other  PERTINENT HISTORY: . 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 No  PRECAUTIONS: Fall  RED FLAGS: None   WEIGHT BEARING RESTRICTIONS: No  FALLS: Has patient fallen in last 6 months? No fall but went to ground intentionally and could not get up   LIVING ENVIRONMENT: LIVING ENVIRONMENT: Lives with: lives with their spouse Lives in: House/apartment Stairs: 1 step into home Has following equipment at home: Retail banker - 2 wheeled *On Arts development officer for [employer] at [employer]  PLOF: Independent with household mobility with device and Independent with community mobility with device  PATIENT GOALS: Improve balance and mobility   OBJECTIVE:  Note: Objective measures were completed at Evaluation unless otherwise noted.   COGNITION: Overall cognitive status: Impaired and Mild cogintive impairment     POSTURE: rounded shoulders, forward head, and increased thoracic kyphosis  LOWER EXTREMITY ROM:   WNL for tasks assessed    LOWER EXTREMITY MMT:    MMT Right Eval Left Eval  Hip flexion 4 4  Hip extension    Hip abduction 4+ 4+  Hip adduction  4+ 4+  Hip internal rotation     Hip external rotation    Knee flexion 4- 4-  Knee extension 4 4  Ankle dorsiflexion -   Ankle plantarflexion -   Ankle inversion    Ankle eversion    (Blank rows = not tested)  BED MOBILITY:  Not tested  TRANSFERS: Sit to stand: Complete Independence and on a few reps noted instability in standing  Assistive device utilized: None     Stand to sit: Complete Independence  Assistive device utilized: None      GAIT: Findings: Gait Characteristics: With cane pt does not use in classic 2 or 3 point pattern but uses sporadically, decreased step length- Right, and decreased step length- Left, Distance walked: 30 ft, Assistive device utilized:Single point cane, Level of assistance: CGA, and Comments: Describing gait with SPC in R UE   FUNCTIONAL TESTS:  5 times sit to stand: 15.55 sec, imbalance on a few Timed up and go (TUG): 22.62 sec with cane  6 minute walk test: Test visit 2  10 meter walk test: 22.67 sec with cane, 13.6 sec with rollator   Berg Balance Scale: Test visit 2    PATIENT SURVEYS:  LEFS  Extreme difficulty/unable (0), Quite a bit of difficulty (1), Moderate difficulty (2), Little difficulty (3), No difficulty (4) Survey date:  WIFE FILLS OUT 05/12/24  Any of your usual work, housework or school activities 3  2. Usual hobbies, recreational or sporting activities 2  3. Getting into/out of the bath 4  4. Walking between rooms 3  5. Putting on socks/shoes 2  6. Squatting  1  7. Lifting an object, like a bag of groceries from the floor 3  8. Performing light activities around your home 3  9. Performing heavy activities around your home 2  10. Getting into/out of a car 1  11. Walking 2 blocks 2  12. Walking 1 mile 1  13. Going up/down 10 stairs (1 flight) 1  14. Standing for 1 hour 2  15.  sitting for 1 hour 4  16. Running on even ground 1  17. Running on uneven ground 1  18. Making sharp turns while running fast 1  19. Hopping  0  20. Rolling over in bed 2   Score total:  40                                                                                                                                 TREATMENT DATE: 07/13/24    Unless otherwise stated, CGA was provided and gait belt donned in order to ensure pt safety  Reciprocal foot tap on 6 inch step. BUE support on rails  Lateral foot tap on 6 inch step BUE supported on 1 rails  Narrow BOS 3 x 45 sec no UE support   Tandem stance light UE support with CGA-min assist to prevent lateral LOB to the R  Normal BOS eyes open/eyes closed 3 x 20 sec with min assist from PT for safety.  Trunk rotation R and L x 7 bil with overpressure from  Standing on airex pad. 3 x 30 sec with with no Ue support and min assist from PT to prevent LOB  Reciprocal foot tap on airex pad x 12 bil  Sit<>stand 2 x 8 no Ue support Standing hip extension x 12 bil   PT required increased assistance for balance in tandem and on unlevel surface on this day with extremely delayed righting reactions for anterior or posterior LOB while on airex pad.     PATIENT EDUCATION: Education details: Pt educated throughout session about proper posture and technique with exercises. Improved exercise technique, movement at target joints, use of target muscles after min to mod verbal, visual, tactile cues. Person educated: Patient Education method: Explanation Education comprehension: verbalized understanding   HOME EXERCISE PROGRAM: Verbally added mini squat at bar to replace STS. No new handout provided, has handout from previous PT  Verbally added HS flexion with BTB to HEP. No new handout provided, has handout from previous PT    GOALS: Goals reviewed with patient? Yes  SHORT TERM GOALS: Target date: 06/09/2024   Patient will be independent in home exercise program to improve strength/mobility for better functional independence with ADLs. Baseline: No HEP currently  Goal status: INITIAL  LONG TERM GOALS: Target date:  08/04/2024  1.  Patient will complete five times sit to stand test in < 15 seconds without LOB indicating an increased LE strength and improved balance. Baseline: 15.55 sec, imbalance on a few reps requiring CGA for safety Goal status: INITIAL  2.  Patient will improve LEFS score to 50   to demonstrate statistically significant improvement in mobility and quality of life as it relates to their LE strength and mobility.  Baseline: 40 Goal status: INITIAL   3.  Patient will increase Berg Balance score by > 6 points to demonstrate decreased fall risk during functional activities. Baseline: 38 Goal status: INITIAL   4.   Patient will reduce timed up and go to <11 seconds to reduce fall risk and demonstrate improved transfer/gait ability. Baseline: 22.62 sec with SPC  Goal status: INITIAL  5.   Patient will increase 10 meter walk test to >1.58m/s as to improve gait speed for better community ambulation and to reduce fall risk. Baseline: .44 m/s w/ SPC, .73 m/s with rollator Goal status: INITIAL  6.   Patient will increase six minute walk test distance by 150 ft or greater for progression to community ambulator and improve gait ability Baseline: 750 ft, increased shuffling style gait from minute 2 and on as well as increased trunk flexion from this point on as well.  Goal status: INITIAL    ASSESSMENT:  CLINICAL IMPRESSION:    Patient arrived with good motivation for completion of pt activities. PT treatment focused on static and dynamic balance training with emphasis on higher level balance tasks and righting reactions. Pt demonstrates improved use of hip strategy to correct LOB with narrow stance and eyes closed, but significantly delayed response on unlevel surface and WB increased weight bearing through the RLE.  Pt will continue to benefit from skilled physical therapy intervention to address impairments, improve QOL, and attain therapy goals.      OBJECTIVE IMPAIRMENTS: Abnormal  gait, decreased activity tolerance, decreased balance, decreased endurance, decreased knowledge of condition, decreased knowledge of use of DME, and decreased strength.   ACTIVITY  LIMITATIONS: standing, squatting, stairs, transfers, and locomotion level  PARTICIPATION LIMITATIONS: meal prep, shopping, and community activity  PERSONAL FACTORS: Age, Behavior pattern, and 3+ comorbidities: HTN, Chronic Diastolic Heart Failure, post polio,  are also affecting patient's functional outcome.   REHAB POTENTIAL: Good  CLINICAL DECISION MAKING: Evolving/moderate complexity  EVALUATION COMPLEXITY: Moderate  PLAN:  PT FREQUENCY: 2x/week  PT DURATION: 12 weeks  PLANNED INTERVENTIONS: 97750- Physical Performance Testing, 97110-Therapeutic exercises, 97530- Therapeutic activity, [id number]- Neuromuscular re-education, 97535- Self Care, [id number]- Manual therapy, and [id number]- Gait training  PLAN FOR NEXT SESSION:   Progress Hip flexor intervention, endurance and extensors. Initiate static balance interventions reflective of BERG deficits    Massie Dollar PT, DPT  Physical Therapist - [facility]  [facility]  1:29 PM 07/13/24

## 2024-07-15 ENCOUNTER — Ambulatory Visit: Admitting: Physical Therapy

## 2024-07-15 DIAGNOSIS — R2689 Other abnormalities of gait and mobility: Secondary | ICD-10-CM

## 2024-07-15 DIAGNOSIS — M6281 Muscle weakness (generalized): Secondary | ICD-10-CM

## 2024-07-15 DIAGNOSIS — R262 Difficulty in walking, not elsewhere classified: Secondary | ICD-10-CM

## 2024-07-15 DIAGNOSIS — R2681 Unsteadiness on feet: Secondary | ICD-10-CM

## 2024-07-15 NOTE — Therapy (Signed)
 OUTPATIENT PHYSICAL THERAPY NEURO TREATMENT   Patient Name: Eugene Martinez. MRN: [id number] DOB:15-Jun-1946, 78 y.o., male Today's Date: 07/15/2024   PCP: Sadie Manna, MD  REFERRING PROVIDER: Abe Erlinda DASEN, PA-C   END OF SESSION:  PT End of Session - 07/15/24 1307     Visit Number 19    Number of Visits 25    Date for PT Re-Evaluation 08/04/24    Progress Note Due on Visit 20    PT Start Time 1317    PT Stop Time 1355    PT Time Calculation (min) 38 min    Equipment Utilized During Treatment Gait belt    Activity Tolerance Patient tolerated treatment well    Behavior During Therapy WFL for tasks assessed/performed                   Past Medical History:  Diagnosis Date   Allergic state    Amyloidosis (HCC)    Anemia    BPH (benign prostatic hyperplasia)    DDD (degenerative disc disease), lumbar    Dysplastic nevus 10/13/2013   Left posterior waistline paraspinal. Moderate to severe atypia, pattern borders on early evolving MIS, edge involved. Excised 03/31/2014, margins free.   Dysplastic nevus 12/19/2017   Right upper back medial. Mild atypia, lateral and deep margins involved.    Dysplastic nevus 11/28/2022   right prox lat thigh, Severe atypia, Excised 01/08/23   Dysrhythmia    Environmental and seasonal allergies    Gallop rhythm    GERD (gastroesophageal reflux disease)    Hypertension    Mild mitral regurgitation    a. by echo 10/2012   Pain    bil. 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 10/2012 Echo: EF 55-60%, severe LVH with speckling of myocardium -? amyloid. Mild MR, mod dil LA.   Past Surgical History:  Procedure Laterality Date   CIRCUMCISION  1984   COLONOSCOPY  03/2012   COLONOSCOPY WITH PROPOFOL  N/A 10/29/2017   Procedure: COLONOSCOPY WITH PROPOFOL ;  Surgeon: Gaylyn Gladis PENNER, MD;  Location[facility] ENDOSCOPY;  Service: Endoscopy;  Laterality: N/A;   FOOT SURGERY     right foot   FOOT SURGERY  Right 2017   LEG SURGERIES     HX.OF MULTIPLE SURGERIES FOR LEG LENGTH DUE TO POLIO   LUMBAR LAMINECTOMY/DECOMPRESSION MICRODISCECTOMY N/A 01/26/2019   Procedure: LUMBAR LAMINECTOMY/DECOMPRESSION MICRODISCECTOMY 1 LEVEL L3-4;  Surgeon: Bluford Standing, MD;  Location: [facility] ORS;  Service: Neurosurgery;  Laterality: N/A;   NECK SURGERY     PILONIDAL CYST / SINUS EXCISION     Patient Active Problem List   Diagnosis Date Noted   Ambulatory dysfunction 10/22/2023   Prediabetes 02/05/2023   Anemia 08/01/2020   Elevated hemoglobin A1c 08/01/2020   Loss of memory 08/01/2020   Sesamoiditis 06/25/2019   S/P lumbar laminectomy 01/26/2019   Weakness of both lower limbs 10/24/2018   LVH (left ventricular hypertrophy) 08/29/2016   Allergy 02/20/2016   Amyloidosis (HCC) 02/20/2016   Thrombocytopenia (HCC) 11/23/2015   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   Airway hyperreactivity 05/07/2013   Benign fibroma of prostate 05/07/2013   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   Chronic diastolic heart failure (HCC) 11/18/2012   Proteinuria, Bence Jones 11/18/2012   Multiple allergies 10/29/2012   Hypertension 10/29/2012   RBBB (right bundle branch block with left anterior fascicular block) 10/29/2012   Proteinuria 10/29/2012    ONSET DATE: 04/22/24  REFERRING  DIAG: R26.9 (ICD-10-CM) - Altered gait   THERAPY DIAG:  Difficulty in walking, not elsewhere classified  Unsteadiness on feet  Other abnormalities of gait and mobility  Muscle weakness (generalized)  Rationale for Evaluation and Treatment: Rehabilitation  SUBJECTIVE:                                                                                                                                                                                             SUBJECTIVE STATEMENT:   Patient and wife deny any falls since last session.   From Eval:Pt wife report he has been inconsistent with exercise since discharge. She also assist with all of  subjective. Pt has been wall and furniture surfing to get around home. Pt had one pseudo fall in last 6 months; he went on a 4's into the recycling can but could not get up. Pt has not been following trough with his exercise. Pt has been using the cane almost all of the time outside of the house in comparison to rollator.  Patient and caregiver want to work on general strength and mobility. Pt caregiver has some concerns with getting rollator into and out of the car.  As well as getting patient into and out of the car.  Pt accompanied by: significant other  PERTINENT HISTORY: . 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 No  PRECAUTIONS: Fall  RED FLAGS: None   WEIGHT BEARING RESTRICTIONS: No  FALLS: Has patient fallen in last 6 months? 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   COGNITION: Overall cognitive status: Impaired and Mild cogintive impairment     POSTURE: rounded shoulders, forward head, and increased thoracic kyphosis  LOWER EXTREMITY ROM:   WNL for tasks assessed    LOWER EXTREMITY MMT:    MMT Right Eval Left Eval  Hip flexion 4 4  Hip extension    Hip abduction 4+ 4+  Hip adduction 4+ 4+  Hip internal rotation    Hip external rotation    Knee flexion 4- 4-  Knee extension 4 4  Ankle dorsiflexion -   Ankle  plantarflexion -   Ankle inversion    Ankle eversion    (Blank rows = not tested)  BED MOBILITY:  Not tested  TRANSFERS: Sit to stand: Complete Independence and on a few reps noted instability in standing  Assistive device utilized: None     Stand to sit: Complete Independence  Assistive device utilized: None      GAIT: Findings: Gait Characteristics: With cane pt does not use in classic 2 or 3 point pattern but uses sporadically, decreased step length- Right, and decreased step length- Left, Distance walked: 30 ft, Assistive device utilized:Single point cane, Level of assistance: CGA, and Comments: Describing gait with SPC in R UE   FUNCTIONAL TESTS:  5 times sit to stand: 15.55 sec, imbalance on a few Timed up and go (TUG): 22.62 sec with cane  6 minute walk test: Test visit 2  10 meter walk test: 22.67 sec with cane, 13.6 sec with rollator   Berg Balance Scale: Test visit 2    PATIENT SURVEYS:  LEFS  Extreme difficulty/unable (0), Quite a bit of difficulty (1), Moderate difficulty (2), Little difficulty (3), No difficulty (4) Survey date:  WIFE FILLS OUT 05/12/24  Any of your usual work, housework or school activities 3  2. Usual hobbies, recreational or sporting activities 2  3. Getting into/out of the bath 4  4. Walking between rooms 3  5. Putting on socks/shoes 2  6. Squatting  1  7. Lifting an object, like a bag of groceries from the floor 3  8. Performing light activities around your home 3  9. Performing heavy activities around your home 2  10. Getting into/out of a car 1  11. Walking 2 blocks 2  12. Walking 1 mile 1  13. Going up/down 10 stairs (1 flight) 1  14. Standing for 1 hour 2  15.  sitting for 1 hour 4  16. Running on even ground 1  17. Running on uneven ground 1  18. Making sharp turns while running fast 1  19. Hopping  0  20. Rolling over in bed 2  Score total:  40                                                                                                                                  TREATMENT DATE: 07/15/24    Unless otherwise stated, CGA was provided and gait belt donned in order to ensure pt safety  TA- To improve functional movements patterns for everyday tasks ' STS then concurrent march with ea LE - close CGA and VC for proper performance 2 x 10  Step up / down from step trainer 2 x 10 with unilateral UE assist  Side step up / down x 10 ea with UE assist  Reciprocal foot tap on 6 inch step. BUE support on rails  Lateral foot tap on  6 inch step BUE supported on 1 rails  NMR: To facilitate reeducation of movement, balance, posture, coordination, and/or proprioception/kinesthetic sense.  Wide tandem with LLE post x several min  NBOS x 45 sec - min LOB  -same with EC x several min, pt with cues for appropriate weight shift to maintain balance  1 foot on step opposite on floor x ea LE with cues for weight shift to appropriate spot to keep balance x multiple min ea LE   Unless otherwise stated, CGA was provided and gait belt donned in order to ensure pt safety   PT required increased assistance for balance in tandem and on unlevel surface on this day with extremely delayed righting reactions for anterior or posterior LOB while on airex pad.     PATIENT EDUCATION: Education details: Pt educated throughout session about proper posture and technique with exercises. Improved exercise technique, movement at target joints, use of target muscles after min to mod verbal, visual, tactile cues. Person educated: Patient Education method: Explanation Education comprehension: verbalized understanding   HOME EXERCISE PROGRAM: Verbally added mini squat at bar to replace STS. No new handout provided, has handout from previous PT  Verbally added HS flexion with BTB to HEP. No new handout provided, has handout from previous PT    GOALS: Goals reviewed with patient? Yes  SHORT TERM GOALS: Target date: 06/09/2024   Patient will be  independent in home exercise program to improve strength/mobility for better functional independence with ADLs. Baseline: No HEP currently  Goal status: INITIAL  LONG TERM GOALS: Target date: 08/04/2024  1.  Patient will complete five times sit to stand test in < 15 seconds without LOB indicating an increased LE strength and improved balance. Baseline: 15.55 sec, imbalance on a few reps requiring CGA for safety Goal status: INITIAL  2.  Patient will improve LEFS score to 50   to demonstrate statistically significant improvement in mobility and quality of life as it relates to their LE strength and mobility.  Baseline: 40 Goal status: INITIAL   3.  Patient will increase Berg Balance score by > 6 points to demonstrate decreased fall risk during functional activities. Baseline: 38 Goal status: INITIAL   4.   Patient will reduce timed up and go to <11 seconds to reduce fall risk and demonstrate improved transfer/gait ability. Baseline: 22.62 sec with SPC  Goal status: INITIAL  5.   Patient will increase 10 meter walk test to >1.52m/s as to improve gait speed for better community ambulation and to reduce fall risk. Baseline: .44 m/s w/ SPC, .73 m/s with rollator Goal status: INITIAL  6.   Patient will increase six minute walk test distance by 150 ft or greater for progression to community ambulator and improve gait ability Baseline: 750 ft, increased shuffling style gait from minute 2 and on as well as increased trunk flexion from this point on as well.  Goal status: INITIAL    ASSESSMENT:  CLINICAL IMPRESSION:    Patient arrived with good motivation for completion of pt activities. PT treatment focused on static and dynamic balance training and functional movement training with emphasis on higher level balance tasks and righting reactions. Pt demonstrates improved use of hip strategy to correct LOB with narrow stance and eyes closed, but significantly delayed response on unlevel  surface and WB increased weight bearing through the RLE.  Pt will continue to benefit from skilled physical therapy intervention to address impairments, improve QOL, and attain therapy goals.  OBJECTIVE IMPAIRMENTS: Abnormal gait, decreased activity tolerance, decreased balance, decreased endurance, decreased knowledge of condition, decreased knowledge of use of DME, and decreased strength.   ACTIVITY LIMITATIONS: standing, squatting, stairs, transfers, and locomotion level  PARTICIPATION LIMITATIONS: meal prep, shopping, and community activity  PERSONAL FACTORS: Age, Behavior pattern, and 3+ comorbidities: HTN, Chronic Diastolic Heart Failure, post polio,  are also affecting patient's functional outcome.   REHAB POTENTIAL: Good  CLINICAL DECISION MAKING: Evolving/moderate complexity  EVALUATION COMPLEXITY: Moderate  PLAN:  PT FREQUENCY: 2x/week  PT DURATION: 12 weeks  PLANNED INTERVENTIONS: 97750- Physical Performance Testing, 97110-Therapeutic exercises, 97530- Therapeutic activity, [id number]- Neuromuscular re-education, 97535- Self Care, [id number]- Manual therapy, and [id number]- Gait training  PLAN FOR NEXT SESSION:   Progress Hip flexor intervention, endurance and extensors. Initiate static balance interventions reflective of BERG deficits  Training keeping COM over BOS    Note: Portions of this document were prepared using Dragon voice recognition software and although reviewed may contain unintentional dictation errors in syntax, grammar, or spelling.  Lonni KATHEE Gainer PT ,DPT Physical Therapist- [facility]   1:08 PM 07/15/24

## 2024-07-20 ENCOUNTER — Ambulatory Visit: Admitting: Physical Therapy

## 2024-07-20 DIAGNOSIS — R262 Difficulty in walking, not elsewhere classified: Secondary | ICD-10-CM

## 2024-07-20 DIAGNOSIS — R2689 Other abnormalities of gait and mobility: Secondary | ICD-10-CM

## 2024-07-20 DIAGNOSIS — M6281 Muscle weakness (generalized): Secondary | ICD-10-CM

## 2024-07-20 DIAGNOSIS — R2681 Unsteadiness on feet: Secondary | ICD-10-CM

## 2024-07-20 NOTE — Therapy (Signed)
 OUTPATIENT PHYSICAL THERAPY NEURO TREATMENT/ Physical Therapy Progress Note   Dates of reporting period  06/17/24   to   07/20/24    Patient Name: Eugene Martinez. MRN: [id number] DOB:1946-01-25, 78 y.o., male Today's Date: 07/20/2024   PCP: Sadie Manna, MD  REFERRING PROVIDER: Caffaro, Kaitlin T, PA-C   END OF SESSION:  PT End of Session - 07/20/24 0937     Visit Number 20    Number of Visits 25    Date for PT Re-Evaluation 08/04/24    Progress Note Due on Visit 30    PT Start Time 0935    PT Stop Time 1013    PT Time Calculation (min) 38 min    Equipment Utilized During Treatment Gait belt    Activity Tolerance Patient tolerated treatment well    Behavior During Therapy WFL for tasks assessed/performed                    Past Medical History:  Diagnosis Date   Allergic state    Amyloidosis (HCC)    Anemia    BPH (benign prostatic hyperplasia)    DDD (degenerative disc disease), lumbar    Dysplastic nevus 10/13/2013   Left posterior waistline paraspinal. Moderate to severe atypia, pattern borders on early evolving MIS, edge involved. Excised 03/31/2014, margins free.   Dysplastic nevus 12/19/2017   Right upper back medial. Mild atypia, lateral and deep margins involved.    Dysplastic nevus 11/28/2022   right prox lat thigh, Severe atypia, Excised 01/08/23   Dysrhythmia    Environmental and seasonal allergies    Gallop rhythm    GERD (gastroesophageal reflux disease)    Hypertension    Mild mitral regurgitation    a. by echo 10/2012   Pain    bil. 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 10/2012 Echo: EF 55-60%, severe LVH with speckling of myocardium -? amyloid. Mild MR, mod dil LA.   Past Surgical History:  Procedure Laterality Date   CIRCUMCISION  1984   COLONOSCOPY  03/2012   COLONOSCOPY WITH PROPOFOL  N/A 10/29/2017   Procedure: COLONOSCOPY WITH PROPOFOL ;  Surgeon: Gaylyn Gladis PENNER, MD;  Location: [facility] ENDOSCOPY;   Service: Endoscopy;  Laterality: N/A;   FOOT SURGERY     right foot   FOOT SURGERY Right 2017   LEG SURGERIES     HX.OF MULTIPLE SURGERIES FOR LEG LENGTH DUE TO POLIO   LUMBAR LAMINECTOMY/DECOMPRESSION MICRODISCECTOMY N/A 01/26/2019   Procedure: LUMBAR LAMINECTOMY/DECOMPRESSION MICRODISCECTOMY 1 LEVEL L3-4;  Surgeon: Bluford Standing, MD;  Location: [facility] ORS;  Service: Neurosurgery;  Laterality: N/A;   NECK SURGERY     PILONIDAL CYST / SINUS EXCISION     Patient Active Problem List   Diagnosis Date Noted   Ambulatory dysfunction 10/22/2023   Prediabetes 02/05/2023   Anemia 08/01/2020   Elevated hemoglobin A1c 08/01/2020   Loss of memory 08/01/2020   Sesamoiditis 06/25/2019   S/P lumbar laminectomy 01/26/2019   Weakness of both lower limbs 10/24/2018   LVH (left ventricular hypertrophy) 08/29/2016   Allergy 02/20/2016   Amyloidosis (HCC) 02/20/2016   Thrombocytopenia (HCC) 11/23/2015   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   Airway hyperreactivity 05/07/2013   Benign fibroma of prostate 05/07/2013   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   Chronic diastolic heart failure (HCC) 11/18/2012   Proteinuria, Bence Jones 11/18/2012   Multiple allergies 10/29/2012   Hypertension 10/29/2012   RBBB (right bundle  branch block with left anterior fascicular block) 10/29/2012   Proteinuria 10/29/2012    ONSET DATE: 04/22/24  REFERRING DIAG: R26.9 (ICD-10-CM) - Altered gait   THERAPY DIAG:  Difficulty in walking, not elsewhere classified  Unsteadiness on feet  Other abnormalities of gait and mobility  Muscle weakness (generalized)  Rationale for Evaluation and Treatment: Rehabilitation  SUBJECTIVE:                                                                                                                                                                                             SUBJECTIVE STATEMENT:   Patient and wife deny any falls since last session.  Patient's wife says he seemed  a little off balance over the weekend using walls for stability and other furniture surfing type symptoms.  Patient encourages patient and caregiver to try to utilize walker as his current balance deficits indicate he needs to use assistive device to ambulate safely.  From Eval:Pt wife report he has been inconsistent with exercise since discharge. She also assist with all of subjective. Pt has been wall and furniture surfing to get around home. Pt had one pseudo fall in last 6 months; he went on a 4's into the recycling can but could not get up. Pt has not been following trough with his exercise. Pt has been using the cane almost all of the time outside of the house in comparison to rollator.  Patient and caregiver want to work on general strength and mobility. Pt caregiver has some concerns with getting rollator into and out of the car.  As well as getting patient into and out of the car.  Pt accompanied by: significant other  PERTINENT HISTORY: . 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 No  PRECAUTIONS: Fall  RED FLAGS: None   WEIGHT BEARING RESTRICTIONS: No  FALLS: Has patient fallen in last 6 months? No fall but went to ground intentionally and could not get up   LIVING ENVIRONMENT: LIVING ENVIRONMENT: Lives with: lives with their spouse Lives in: House/apartment Stairs: 1 step into home Has following equipment at home: Retail banker - 2 wheeled *On Arts development officer for  [employer] at [employer]  PLOF: Independent with household mobility with device and Independent with community mobility with device  PATIENT GOALS: Improve balance and mobility   OBJECTIVE:  Note: Objective measures were completed at Evaluation unless otherwise noted.   COGNITION: Overall cognitive status: Impaired and Mild cogintive impairment     POSTURE:  rounded  shoulders, forward head, and increased thoracic kyphosis  LOWER EXTREMITY ROM:   WNL for tasks assessed    LOWER EXTREMITY MMT:    MMT Right Eval Left Eval  Hip flexion 4 4  Hip extension    Hip abduction 4+ 4+  Hip adduction 4+ 4+  Hip internal rotation    Hip external rotation    Knee flexion 4- 4-  Knee extension 4 4  Ankle dorsiflexion -   Ankle plantarflexion -   Ankle inversion    Ankle eversion    (Blank rows = not tested)  BED MOBILITY:  Not tested  TRANSFERS: Sit to stand: Complete Independence and on a few reps noted instability in standing  Assistive device utilized: None     Stand to sit: Complete Independence  Assistive device utilized: None      GAIT: Findings: Gait Characteristics: With cane pt does not use in classic 2 or 3 point pattern but uses sporadically, decreased step length- Right, and decreased step length- Left, Distance walked: 30 ft, Assistive device utilized:Single point cane, Level of assistance: CGA, and Comments: Describing gait with SPC in R UE   FUNCTIONAL TESTS:  5 times sit to stand: 15.55 sec, imbalance on a few Timed up and go (TUG): 22.62 sec with cane  6 minute walk test: Test visit 2  10 meter walk test: 22.67 sec with cane, 13.6 sec with rollator   Berg Balance Scale: Test visit 2    PATIENT SURVEYS:  LEFS  Extreme difficulty/unable (0), Quite a bit of difficulty (1), Moderate difficulty (2), Little difficulty (3), No difficulty (4) Survey date:  WIFE FILLS OUT 05/12/24  Any of your usual work, housework or school activities 3  2. Usual hobbies, recreational or sporting activities 2  3. Getting into/out of the bath 4  4. Walking between rooms 3  5. Putting on socks/shoes 2  6. Squatting  1  7. Lifting an object, like a bag of groceries from the floor 3  8. Performing light activities around your home 3  9. Performing heavy activities around your home 2  10. Getting into/out of a car 1  11. Walking 2 blocks 2   12. Walking 1 mile 1  13. Going up/down 10 stairs (1 flight) 1  14. Standing for 1 hour 2  15.  sitting for 1 hour 4  16. Running on even ground 1  17. Running on uneven ground 1  18. Making sharp turns while running fast 1  19. Hopping  0  20. Rolling over in bed 2  Score total:  40                                                                                                                                 TREATMENT DATE: 07/20/24    Physical therapy treatment session today consisted of completing assessment of goals and administration of testing as demonstrated and documented in flow  sheet, treatment, and goals section of this note. Addition treatments may be found below.   Physical Performance Test or Measurement: a  physical performance test(s) or measurement (eg,  musculoskeletal, functional capacity), with written report,  each 15 mins   PT instructed pt in TUG: 15.39 sec ( >13.5 sec indicates increased fall risk)  Patient demonstrates increased fall risk as noted by score of  43 /56 on Berg Balance Scale.  (<36= high risk for falls, close to 100%; 37-45 significant >80%; 46-51 moderate >50%; 52-55 lower >25%)   [facility] PT Assessment - 07/20/24 0001       Berg Balance Test   Sit to Stand Able to stand without using hands and stabilize independently    Standing Unsupported Able to stand safely 2 minutes    Sitting with Back Unsupported but Feet Supported on Floor or Stool Able to sit safely and securely 2 minutes    Stand to Sit Sits safely with minimal use of hands    Transfers Able to transfer safely, minor use of hands    Standing Unsupported with Eyes Closed Able to stand 10 seconds safely    Standing Unsupported with Feet Together Able to place feet together independently and stand 1 minute safely    From Standing, Reach Forward with Outstretched Arm Can reach forward >12 cm safely (5)    From Standing Position, Pick up Object from Floor Unable to pick up shoe, but  reaches 2-5 cm (1-2) from shoe and balances independently    From Standing Position, Turn to Look Behind Over each Shoulder Looks behind one side only/other side shows less weight shift    Turn 360 Degrees Able to turn 360 degrees safely but slowly    Standing Unsupported, Alternately Place Feet on Step/Stool Able to complete 4 steps without aid or supervision    Standing Unsupported, One Foot in Front Able to take small step independently and hold 30 seconds    Standing on One Leg Tries to lift leg/unable to hold 3 seconds but remains standing independently    Total Score 43           6 Min Walk Test:   Instructed patient to ambulate as quickly and as safely as possible for 6 minutes using LRAD. Patient was allowed to take standing rest breaks without stopping the test, but if the patient required a sitting rest break the clock would be stopped and the test would be over.  Results: 750 feet 4WW, improved gait quality ( no shuffling until last minute) and improved trunk flexion posture. Similar speed but less fatigue. Results indicate that the patient has reduced endurance with ambulation compared to age matched norms.  Age Matched Norms (in meters): 20-69 yo M: 42 F: 20, 45-79 yo M: 12 F: 471, 23-89 yo M: 417 F: 392 MDC: 58.21 meters (190.98 feet) or 50 meters (ANPTA Core Set of Outcome Measures for Adults with Neurologic Conditions, 2018)  Five times Sit to Stand Test (FTSS)  TIME: 15.62 sec  Cut off scores indicative of increased fall risk: >12 sec CVA, >16 sec PD, >13 sec vestibular (ANPTA Core Set of Outcome Measures for Adults with Neurologic Conditions, 2018)   PATIENT EDUCATION: Education details: Pt educated throughout session about proper posture and technique with exercises. Improved exercise technique, movement at target joints, use of target muscles after min to mod verbal, visual, tactile cues. Person educated: Patient Education method: Explanation Education  comprehension: verbalized understanding   HOME EXERCISE PROGRAM: Verbally  added mini squat at bar to replace STS. No new handout provided, has handout from previous PT  Verbally added HS flexion with BTB to HEP. No new handout provided, has handout from previous PT    GOALS: Goals reviewed with patient? Yes  SHORT TERM GOALS: Target date: 06/09/2024   Patient will be independent in home exercise program to improve strength/mobility for better functional independence with ADLs. Baseline: No HEP currently 8/11: Diligently completing HEP  Goal status: INITIAL  LONG TERM GOALS: Target date: 08/04/2024  1.  Patient will complete five times sit to stand test in < 15 seconds without LOB indicating an increased LE strength and improved balance. Baseline: 15.55 sec, imbalance on a few reps requiring CGA for safety 7/7:14.11 sec 8/11:15.62 sec no UE Goal status: ONGOING  2.  Patient will improve LEFS score to 50   to demonstrate statistically significant improvement in mobility and quality of life as it relates to their LE strength and mobility.  Baseline: 40 7/7:42 Goal status: ONGOING   3.  Patient will increase Berg Balance score by > 6 points to demonstrate decreased fall risk during functional activities. Baseline: 38 8/11:43 Goal status: INITIAL   4.   Patient will reduce timed up and go to <11 seconds to reduce fall risk and demonstrate improved transfer/gait ability. Baseline: 22.62 sec with SPC  8/11:15.39 sec with 4WW Goal status: INITIAL  5.   Patient will increase 10 meter walk test to >1.106m/s as to improve gait speed for better community ambulation and to reduce fall risk. Baseline: .44 m/s w/ SPC, .73 m/s with rollator .75 m/s with rollator  Goal status: INITIAL  6.   Patient will increase six minute walk test distance by 150 ft or greater for progression to community ambulator and improve gait ability Baseline: progress note 7/7:750 ft, increased shuffling style gait from  minute 2 and on as well as increased trunk flexion from this point on as well. 8/11: 750 ft with 4WW, improved gait quality ( no shuffling until last minute) and improved trunk flexion posture. Similar speed but less fatigue Goal status: INITIAL    ASSESSMENT:  CLINICAL IMPRESSION:    Patient presents with good motivation for completion of progress note this date.  Patient shows progress with his balance related impairments as evidenced by improved Berg balance scale score as well as improved and timed up and go since last testing.  Patient's endurance with gait continues to be about the same overall speed patient showed much better quality with ambulation this date showing much later tendency to shuffle his feet as well as move further away from the walker and hunched over posture.  Patient's condition has the potential to improve in response to therapy. Maximum improvement is yet to be obtained. The anticipated improvement is attainable and reasonable in a generally predictable time.  Pt will continue to benefit from skilled physical therapy intervention to address impairments, improve QOL, and attain therapy goals.       OBJECTIVE IMPAIRMENTS: Abnormal gait, decreased activity tolerance, decreased balance, decreased endurance, decreased knowledge of condition, decreased knowledge of use of DME, and decreased strength.   ACTIVITY LIMITATIONS: standing, squatting, stairs, transfers, and locomotion level  PARTICIPATION LIMITATIONS: meal prep, shopping, and community activity  PERSONAL FACTORS: Age, Behavior pattern, and 3+ comorbidities: HTN, Chronic Diastolic Heart Failure, post polio,  are also affecting patient's functional outcome.   REHAB POTENTIAL: Good  CLINICAL DECISION MAKING: Evolving/moderate complexity  EVALUATION COMPLEXITY: Moderate  PLAN:  PT FREQUENCY: 2x/week  PT DURATION: 12 weeks  PLANNED INTERVENTIONS: 97750- Physical Performance Testing, 97110-Therapeutic  exercises, 97530- Therapeutic activity, [id number]- Neuromuscular re-education, 97535- Self Care, [id number]- Manual therapy, and [id number]- Gait training  PLAN FOR NEXT SESSION:   Progress Hip flexor intervention, endurance and extensors. Initiate static balance interventions reflective of BERG deficits  Training keeping COM over BOS    Note: Portions of this document were prepared using Dragon voice recognition software and although reviewed may contain unintentional dictation errors in syntax, grammar, or spelling.  Lonni KATHEE Gainer PT ,DPT Physical Therapist- [place]  [facility]   9:39 AM 07/20/24

## 2024-07-22 ENCOUNTER — Ambulatory Visit: Admitting: Physical Therapy

## 2024-07-22 DIAGNOSIS — R262 Difficulty in walking, not elsewhere classified: Secondary | ICD-10-CM

## 2024-07-22 DIAGNOSIS — R2681 Unsteadiness on feet: Secondary | ICD-10-CM

## 2024-07-22 DIAGNOSIS — M6281 Muscle weakness (generalized): Secondary | ICD-10-CM

## 2024-07-22 DIAGNOSIS — R2689 Other abnormalities of gait and mobility: Secondary | ICD-10-CM

## 2024-07-22 NOTE — Therapy (Signed)
 OUTPATIENT PHYSICAL THERAPY NEURO TREATMENT    Patient Name: Eugene Martinez. MRN: [id number] DOB:08/25/1946, 78 y.o., male Today's Date: 07/22/2024   PCP: Sadie Manna, MD  REFERRING PROVIDER: Caffaro, Kaitlin T, PA-C   END OF SESSION:  PT End of Session - 07/22/24 1320     Visit Number 21    Number of Visits 25    Date for PT Re-Evaluation 08/04/24    Progress Note Due on Visit 30    PT Start Time 1318    PT Stop Time 1357    PT Time Calculation (min) 39 min    Equipment Utilized During Treatment Gait belt    Activity Tolerance Patient tolerated treatment well    Behavior During Therapy WFL for tasks assessed/performed                    Past Medical History:  Diagnosis Date   Allergic state    Amyloidosis (HCC)    Anemia    BPH (benign prostatic hyperplasia)    DDD (degenerative disc disease), lumbar    Dysplastic nevus 10/13/2013   Left posterior waistline paraspinal. Moderate to severe atypia, pattern borders on early evolving MIS, edge involved. Excised 03/31/2014, margins free.   Dysplastic nevus 12/19/2017   Right upper back medial. Mild atypia, lateral and deep margins involved.    Dysplastic nevus 11/28/2022   right prox lat thigh, Severe atypia, Excised 01/08/23   Dysrhythmia    Environmental and seasonal allergies    Gallop rhythm    GERD (gastroesophageal reflux disease)    Hypertension    Mild mitral regurgitation    a. by echo 10/2012   Pain    bil. 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 10/2012 Echo: EF 55-60%, severe LVH with speckling of myocardium -? amyloid. Mild MR, mod dil LA.   Past Surgical History:  Procedure Laterality Date   CIRCUMCISION  1984   COLONOSCOPY  03/2012   COLONOSCOPY WITH PROPOFOL  N/A 10/29/2017   Procedure: COLONOSCOPY WITH PROPOFOL ;  Surgeon: Gaylyn Gladis PENNER, MD;  Location: [facility] ENDOSCOPY;  Service: Endoscopy;  Laterality: N/A;   FOOT SURGERY     right foot   FOOT  SURGERY Right 2017   LEG SURGERIES     HX.OF MULTIPLE SURGERIES FOR LEG LENGTH DUE TO POLIO   LUMBAR LAMINECTOMY/DECOMPRESSION MICRODISCECTOMY N/A 01/26/2019   Procedure: LUMBAR LAMINECTOMY/DECOMPRESSION MICRODISCECTOMY 1 LEVEL L3-4;  Surgeon: Bluford Standing, MD;  Location: [facility] ORS;  Service: Neurosurgery;  Laterality: N/A;   NECK SURGERY     PILONIDAL CYST / SINUS EXCISION     Patient Active Problem List   Diagnosis Date Noted   Ambulatory dysfunction 10/22/2023   Prediabetes 02/05/2023   Anemia 08/01/2020   Elevated hemoglobin A1c 08/01/2020   Loss of memory 08/01/2020   Sesamoiditis 06/25/2019   S/P lumbar laminectomy 01/26/2019   Weakness of both lower limbs 10/24/2018   LVH (left ventricular hypertrophy) 08/29/2016   Allergy 02/20/2016   Amyloidosis (HCC) 02/20/2016   Thrombocytopenia (HCC) 11/23/2015   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   Airway hyperreactivity 05/07/2013   Benign fibroma of prostate 05/07/2013   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   Chronic diastolic heart failure (HCC) 11/18/2012   Proteinuria, Bence Jones 11/18/2012   Multiple allergies 10/29/2012   Hypertension 10/29/2012   RBBB (right bundle branch block with left anterior fascicular block) 10/29/2012   Proteinuria 10/29/2012    ONSET DATE: 04/22/24  REFERRING DIAG: R26.9 (ICD-10-CM) - Altered gait   THERAPY DIAG:  Difficulty in walking, not elsewhere classified  Unsteadiness on feet  Other abnormalities of gait and mobility  Muscle weakness (generalized)  Rationale for Evaluation and Treatment: Rehabilitation  SUBJECTIVE:                                                                                                                                                                                             SUBJECTIVE STATEMENT:   Pt reports doing well today. Pt denies any recent falls/stumbles since prior session. Pt denies any updates to medications or medical appointment since prior  session. Pt reports good compliance with HEP when time permits.   From Eval:Pt wife report he has been inconsistent with exercise since discharge. She also assist with all of subjective. Pt has been wall and furniture surfing to get around home. Pt had one pseudo fall in last 6 months; he went on a 4's into the recycling can but could not get up. Pt has not been following trough with his exercise. Pt has been using the cane almost all of the time outside of the house in comparison to rollator.  Patient and caregiver want to work on general strength and mobility. Pt caregiver has some concerns with getting rollator into and out of the car.  As well as getting patient into and out of the car.  Pt accompanied by: significant other  PERTINENT HISTORY: . 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 No  PRECAUTIONS: Fall  RED FLAGS: None   WEIGHT BEARING RESTRICTIONS: No  FALLS: Has patient fallen in last 6 months? 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   COGNITION: Overall cognitive status: Impaired and Mild cogintive impairment     POSTURE: rounded shoulders, forward head, and increased thoracic kyphosis  LOWER EXTREMITY ROM:   WNL for tasks assessed    LOWER EXTREMITY MMT:    MMT Right Eval Left Eval  Hip flexion 4 4  Hip  extension  Hip abduction 4+ 4+  Hip adduction 4+ 4+  Hip internal rotation    Hip external rotation    Knee flexion 4- 4-  Knee extension 4 4  Ankle dorsiflexion -   Ankle plantarflexion -   Ankle inversion    Ankle eversion    (Blank rows = not tested)  BED MOBILITY:  Not tested  TRANSFERS: Sit to stand: Complete Independence and on a few reps noted instability in standing  Assistive device utilized: None     Stand to sit: Complete Independence  Assistive device utilized: None      GAIT: Findings: Gait Characteristics: With cane pt does not use in classic 2 or 3 point pattern but uses sporadically, decreased step length- Right, and decreased step length- Left, Distance walked: 30 ft, Assistive device utilized:Single point cane, Level of assistance: CGA, and Comments: Describing gait with SPC in R UE   FUNCTIONAL TESTS:  5 times sit to stand: 15.55 sec, imbalance on a few Timed up and go (TUG): 22.62 sec with cane  6 minute walk test: Test visit 2  10 meter walk test: 22.67 sec with cane, 13.6 sec with rollator   Berg Balance Scale: Test visit 2    PATIENT SURVEYS:  LEFS  Extreme difficulty/unable (0), Quite a bit of difficulty (1), Moderate difficulty (2), Little difficulty (3), No difficulty (4) Survey date:  WIFE FILLS OUT 05/12/24  Any of your usual work, housework or school activities 3  2. Usual hobbies, recreational or sporting activities 2  3. Getting into/out of the bath 4  4. Walking between rooms 3  5. Putting on socks/shoes 2  6. Squatting  1  7. Lifting an object, like a bag of groceries from the floor 3  8. Performing light activities around your home 3  9. Performing heavy activities around your home 2  10. Getting into/out of a car 1  11. Walking 2 blocks 2  12. Walking 1 mile 1  13. Going up/down 10 stairs (1 flight) 1  14. Standing for 1 hour 2  15.  sitting for 1 hour 4  16. Running on even ground 1  17. Running on uneven ground 1  18. Making  sharp turns while running fast 1  19. Hopping  0  20. Rolling over in bed 2  Score total:  40                                                                                                                                 TREATMENT DATE: 07/22/24    NMR: To facilitate reeducation of movement, balance, posture, coordination, and/or proprioception/kinesthetic sense.  Airex adducted stance 3 x 1 min   Stance on 20 degree incline x 5-6 min, verbal and tactile cues for appropriate hip positioning   Stance on 10-15 degree ( largest wobble board with 2 inch plank under bottom side to reduce inclinc) incline x 3 min with  rest intervals   TA- To improve functional movements patterns for everyday tasks   Sidestepping x 5 laps no UE support   Squats with UE support 3 x 10 reps, cues for proper form to allow greater LE force production   Forward and reverse gait without UE support or device but close CGA 2 x 40 ft forward gait and 2 x 24 ft retro gait,   -slow and step to gait with retro gait   STS x 10 reps no UE assist, cue for forward weight shift   PATIENT EDUCATION: Education details: Pt educated throughout session about proper posture and technique with exercises. Improved exercise technique, movement at target joints, use of target muscles after min to mod verbal, visual, tactile cues. Person educated: Patient Education method: Explanation Education comprehension: verbalized understanding   HOME EXERCISE PROGRAM: Verbally added mini squat at bar to replace STS. No new handout provided, has handout from previous PT  Verbally added HS flexion with BTB to HEP. No new handout provided, has handout from previous PT    GOALS: Goals reviewed with patient? Yes  SHORT TERM GOALS: Target date: 06/09/2024   Patient will be independent in home exercise program to improve strength/mobility for better functional independence with ADLs. Baseline: No HEP currently 8/11: Diligently completing  HEP  Goal status: INITIAL  LONG TERM GOALS: Target date: 08/04/2024  1.  Patient will complete five times sit to stand test in < 15 seconds without LOB indicating an increased LE strength and improved balance. Baseline: 15.55 sec, imbalance on a few reps requiring CGA for safety 7/7:14.11 sec 8/11:15.62 sec no UE Goal status: ONGOING  2.  Patient will improve LEFS score to 50   to demonstrate statistically significant improvement in mobility and quality of life as it relates to their LE strength and mobility.  Baseline: 40 7/7:42 Goal status: ONGOING   3.  Patient will increase Berg Balance score by > 6 points to demonstrate decreased fall risk during functional activities. Baseline: 38 8/11:43 Goal status: INITIAL   4.   Patient will reduce timed up and go to <11 seconds to reduce fall risk and demonstrate improved transfer/gait ability. Baseline: 22.62 sec with SPC  8/11:15.39 sec with 4WW Goal status: INITIAL  5.   Patient will increase 10 meter walk test to >1.9m/s as to improve gait speed for better community ambulation and to reduce fall risk. Baseline: .44 m/s w/ SPC, .73 m/s with rollator .75 m/s with rollator  Goal status: INITIAL  6.   Patient will increase six minute walk test distance by 150 ft or greater for progression to community ambulator and improve gait ability Baseline: progress note 7/7:750 ft, increased shuffling style gait from minute 2 and on as well as increased trunk flexion from this point on as well. 8/11: 750 ft with 4WW, improved gait quality ( no shuffling until last minute) and improved trunk flexion posture. Similar speed but less fatigue Goal status: INITIAL    ASSESSMENT:  CLINICAL IMPRESSION:    Continued with current plan of care as laid out in evaluation and recent prior sessions. Pt remains motivated to advance progress toward goals in order to maximize independence and safety at home. Pt requires high level assistance and cuing for  completion of exercises in order to provide adequate level of stimulation and perturbation. Author allows pt as much opportunity as possible to perform independent righting strategies, only stepping in when pt is unable to prevent falling to floor. Pt showed  progress with forward weight shift at end of session following targeted interventions. Pt closely monitored throughout session for safe vitals response and to maximize patient safety during interventions. Pt continues to demonstrate progress toward goals AEB progression of some interventions this date either in volume or intensity.    OBJECTIVE IMPAIRMENTS: Abnormal gait, decreased activity tolerance, decreased balance, decreased endurance, decreased knowledge of condition, decreased knowledge of use of DME, and decreased strength.   ACTIVITY LIMITATIONS: standing, squatting, stairs, transfers, and locomotion level  PARTICIPATION LIMITATIONS: meal prep, shopping, and community activity  PERSONAL FACTORS: Age, Behavior pattern, and 3+ comorbidities: HTN, Chronic Diastolic Heart Failure, post polio,  are also affecting patient's functional outcome.   REHAB POTENTIAL: Good  CLINICAL DECISION MAKING: Evolving/moderate complexity  EVALUATION COMPLEXITY: Moderate  PLAN:  PT FREQUENCY: 2x/week  PT DURATION: 12 weeks  PLANNED INTERVENTIONS: 97750- Physical Performance Testing, 97110-Therapeutic exercises, 97530- Therapeutic activity, [id number]- Neuromuscular re-education, 97535- Self Care, [id number]- Manual therapy, and [id number]- Gait training  PLAN FOR NEXT SESSION:   Progress Hip flexor intervention, endurance and extensors. Initiate static balance interventions reflective of BERG deficits  Training keeping COM over BOS    Note: Portions of this document were prepared using Dragon voice recognition software and although reviewed may contain unintentional dictation errors in syntax, grammar, or spelling.  Lonni KATHEE Gainer PT ,DPT Physical  Therapist- [place]  [facility]   1:21 PM 07/22/24

## 2024-07-27 ENCOUNTER — Ambulatory Visit: Admitting: Physical Therapy

## 2024-07-27 DIAGNOSIS — R7309 Other abnormal glucose: Secondary | ICD-10-CM | POA: Diagnosis not present

## 2024-07-27 DIAGNOSIS — I5032 Chronic diastolic (congestive) heart failure: Secondary | ICD-10-CM | POA: Diagnosis not present

## 2024-07-27 DIAGNOSIS — R8290 Unspecified abnormal findings in urine: Secondary | ICD-10-CM | POA: Diagnosis not present

## 2024-07-27 DIAGNOSIS — R262 Difficulty in walking, not elsewhere classified: Secondary | ICD-10-CM | POA: Diagnosis not present

## 2024-07-27 DIAGNOSIS — R2689 Other abnormalities of gait and mobility: Secondary | ICD-10-CM

## 2024-07-27 DIAGNOSIS — D649 Anemia, unspecified: Secondary | ICD-10-CM | POA: Diagnosis not present

## 2024-07-27 DIAGNOSIS — R413 Other amnesia: Secondary | ICD-10-CM | POA: Diagnosis not present

## 2024-07-27 DIAGNOSIS — I10 Essential (primary) hypertension: Secondary | ICD-10-CM | POA: Diagnosis not present

## 2024-07-27 DIAGNOSIS — B952 Enterococcus as the cause of diseases classified elsewhere: Secondary | ICD-10-CM | POA: Diagnosis not present

## 2024-07-27 DIAGNOSIS — R2681 Unsteadiness on feet: Secondary | ICD-10-CM

## 2024-07-27 DIAGNOSIS — M6281 Muscle weakness (generalized): Secondary | ICD-10-CM

## 2024-07-27 DIAGNOSIS — N390 Urinary tract infection, site not specified: Secondary | ICD-10-CM | POA: Diagnosis not present

## 2024-07-27 NOTE — Therapy (Signed)
 OUTPATIENT PHYSICAL THERAPY NEURO TREATMENT    Patient Name: Eugene Martinez. MRN: [id number] DOB:Oct 25, 1946, 78 y.o., male Today's Date: 07/27/2024   PCP: Sadie Manna, MD  REFERRING PROVIDER: Caffaro, Kaitlin T, PA-C   END OF SESSION:  PT End of Session - 07/27/24 0936     Visit Number 22    Number of Visits 25    Date for PT Re-Evaluation 08/04/24    Progress Note Due on Visit 30    PT Start Time 0933    PT Stop Time 1013    PT Time Calculation (min) 40 min    Equipment Utilized During Treatment Gait belt    Activity Tolerance Patient tolerated treatment well    Behavior During Therapy WFL for tasks assessed/performed                    Past Medical History:  Diagnosis Date   Allergic state    Amyloidosis (HCC)    Anemia    BPH (benign prostatic hyperplasia)    DDD (degenerative disc disease), lumbar    Dysplastic nevus 10/13/2013   Left posterior waistline paraspinal. Moderate to severe atypia, pattern borders on early evolving MIS, edge involved. Excised 03/31/2014, margins free.   Dysplastic nevus 12/19/2017   Right upper back medial. Mild atypia, lateral and deep margins involved.    Dysplastic nevus 11/28/2022   right prox lat thigh, Severe atypia, Excised 01/08/23   Dysrhythmia    Environmental and seasonal allergies    Gallop rhythm    GERD (gastroesophageal reflux disease)    Hypertension    Mild mitral regurgitation    a. by echo 10/2012   Pain    bil. 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 10/2012 Echo: EF 55-60%, severe LVH with speckling of myocardium -? amyloid. Mild MR, mod dil LA.   Past Surgical History:  Procedure Laterality Date   CIRCUMCISION  1984   COLONOSCOPY  03/2012   COLONOSCOPY WITH PROPOFOL  N/A 10/29/2017   Procedure: COLONOSCOPY WITH PROPOFOL ;  Surgeon: Gaylyn Gladis PENNER, MD;  Location: [facility] ENDOSCOPY;  Service: Endoscopy;  Laterality: N/A;   FOOT SURGERY     right foot   FOOT  SURGERY Right 2017   LEG SURGERIES     HX.OF MULTIPLE SURGERIES FOR LEG LENGTH DUE TO POLIO   LUMBAR LAMINECTOMY/DECOMPRESSION MICRODISCECTOMY N/A 01/26/2019   Procedure: LUMBAR LAMINECTOMY/DECOMPRESSION MICRODISCECTOMY 1 LEVEL L3-4;  Surgeon: Bluford Standing, MD;  Location: [facility] ORS;  Service: Neurosurgery;  Laterality: N/A;   NECK SURGERY     PILONIDAL CYST / SINUS EXCISION     Patient Active Problem List   Diagnosis Date Noted   Ambulatory dysfunction 10/22/2023   Prediabetes 02/05/2023   Anemia 08/01/2020   Elevated hemoglobin A1c 08/01/2020   Loss of memory 08/01/2020   Sesamoiditis 06/25/2019   S/P lumbar laminectomy 01/26/2019   Weakness of both lower limbs 10/24/2018   LVH (left ventricular hypertrophy) 08/29/2016   Allergy 02/20/2016   Amyloidosis (HCC) 02/20/2016   Thrombocytopenia (HCC) 11/23/2015   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  Rationale for Evaluation and Treatment: Rehabilitation  SUBJECTIVE:                                                                                                                                                                                             SUBJECTIVE STATEMENT:   Pt reports doing well today. Pt denies any recent falls/stumbles since prior session. Pt denies any updates to medications or medical appointment since prior session. Pt reports good compliance with HEP when time permits.   From Eval:Pt wife report he has been inconsistent with exercise  since discharge. She also assist with all of subjective. Pt has been wall and furniture surfing to get around home. Pt had one pseudo fall in last 6 months; he went on a 4's into the recycling can but could not get up. Pt has not been following trough with his exercise. Pt has been using the cane almost all of the time outside of the house in comparison to rollator.  Patient and caregiver want to work on general strength and mobility. Pt caregiver has some concerns with getting rollator into and out of the car.  As well as getting patient into and out of the car.  Pt accompanied by: significant other  PERTINENT HISTORY: . 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 No  PRECAUTIONS: Fall  RED FLAGS: None   WEIGHT BEARING RESTRICTIONS: No  FALLS: Has patient fallen in last 6 months? 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   COGNITION: Overall cognitive status: Impaired and Mild cogintive impairment     POSTURE: rounded shoulders, forward head, and increased thoracic kyphosis  LOWER EXTREMITY ROM:   WNL for tasks assessed    LOWER EXTREMITY MMT:    MMT Right Eval Left Eval  Hip flexion 4 4  Hip extension    Hip abduction 4+ 4+  Hip adduction 4+ 4+  Hip internal rotation    Hip external rotation  Knee flexion 4- 4-  Knee  extension 4 4  Ankle dorsiflexion -   Ankle plantarflexion -   Ankle inversion    Ankle eversion    (Blank rows = not tested)  BED MOBILITY:  Not tested  TRANSFERS: Sit to stand: Complete Independence and on a few reps noted instability in standing  Assistive device utilized: None     Stand to sit: Complete Independence  Assistive device utilized: None      GAIT: Findings: Gait Characteristics: With cane pt does not use in classic 2 or 3 point pattern but uses sporadically, decreased step length- Right, and decreased step length- Left, Distance walked: 30 ft, Assistive device utilized:Single point cane, Level of assistance: CGA, and Comments: Describing gait with SPC in R UE   FUNCTIONAL TESTS:  5 times sit to stand: 15.55 sec, imbalance on a few Timed up and go (TUG): 22.62 sec with cane  6 minute walk test: Test visit 2  10 meter walk test: 22.67 sec with cane, 13.6 sec with rollator   Berg Balance Scale: Test visit 2    PATIENT SURVEYS:  LEFS  Extreme difficulty/unable (0), Quite a bit of difficulty (1), Moderate difficulty (2), Little difficulty (3), No difficulty (4) Survey date:  WIFE FILLS OUT 05/12/24  Any of your usual work, housework or school activities 3  2. Usual hobbies, recreational or sporting activities 2  3. Getting into/out of the bath 4  4. Walking between rooms 3  5. Putting on socks/shoes 2  6. Squatting  1  7. Lifting an object, like a bag of groceries from the floor 3  8. Performing light activities around your home 3  9. Performing heavy activities around your home 2  10. Getting into/out of a car 1  11. Walking 2 blocks 2  12. Walking 1 mile 1  13. Going up/down 10 stairs (1 flight) 1  14. Standing for 1 hour 2  15.  sitting for 1 hour 4  16. Running on even ground 1  17. Running on uneven ground 1  18. Making sharp turns while running fast 1  19. Hopping  0  20. Rolling over in bed 2  Score total:  40                                                                                                                                  TREATMENT DATE: 07/27/24    NMR: To facilitate reeducation of movement, balance, posture, coordination, and/or proprioception/kinesthetic sense.  Airex beam stance 3 x 1 min   Stance on 10-15 degree ( largest wobble board with 2 inch plank under bottom side to reduce incline) incline x 3 min with rest intervals   TA- To improve functional movements patterns for everyday tasks   Sidestepping x 5 laps on airex beam with support  Squats with UE support 3 x 10 reps, cues for proper form to allow  greater LE force production   Forward and reverse gait without UE support or device but close CGA3 x 15 ft ea  STS 2 x 10 reps no UE assist, cue for forward weight shift   PATIENT EDUCATION: Education details: Pt educated throughout session about proper posture and technique with exercises. Improved exercise technique, movement at target joints, use of target muscles after min to mod verbal, visual, tactile cues. Person educated: Patient Education method: Explanation Education comprehension: verbalized understanding   HOME EXERCISE PROGRAM: Verbally added mini squat at bar to replace STS. No new handout provided, has handout from previous PT  Verbally added HS flexion with BTB to HEP. No new handout provided, has handout from previous PT    GOALS: Goals reviewed with patient? Yes  SHORT TERM GOALS: Target date: 06/09/2024   Patient will be independent in home exercise program to improve strength/mobility for better functional independence with ADLs. Baseline: No HEP currently 8/11: Diligently completing HEP  Goal status: INITIAL  LONG TERM GOALS: Target date: 08/04/2024  1.  Patient will complete five times sit to stand test in < 15 seconds without LOB indicating an increased LE strength and improved balance. Baseline: 15.55 sec, imbalance on a few reps requiring CGA for safety 7/7:14.11 sec 8/11:15.62  sec no UE Goal status: ONGOING  2.  Patient will improve LEFS score to 50   to demonstrate statistically significant improvement in mobility and quality of life as it relates to their LE strength and mobility.  Baseline: 40 7/7:42 Goal status: ONGOING   3.  Patient will increase Berg Balance score by > 6 points to demonstrate decreased fall risk during functional activities. Baseline: 38 8/11:43 Goal status: INITIAL   4.   Patient will reduce timed up and go to <11 seconds to reduce fall risk and demonstrate improved transfer/gait ability. Baseline: 22.62 sec with SPC  8/11:15.39 sec with 4WW Goal status: INITIAL  5.   Patient will increase 10 meter walk test to >1.44m/s as to improve gait speed for better community ambulation and to reduce fall risk. Baseline: .44 m/s w/ SPC, .73 m/s with rollator .75 m/s with rollator  Goal status: INITIAL  6.   Patient will increase six minute walk test distance by 150 ft or greater for progression to community ambulator and improve gait ability Baseline: progress note 7/7:750 ft, increased shuffling style gait from minute 2 and on as well as increased trunk flexion from this point on as well. 8/11: 750 ft with 4WW, improved gait quality ( no shuffling until last minute) and improved trunk flexion posture. Similar speed but less fatigue Goal status: INITIAL    ASSESSMENT:  CLINICAL IMPRESSION:    Continued with current plan of care as laid out in evaluation and recent prior sessions. Pt remains motivated to advance progress toward goals in order to maximize independence and safety at home. Pt requires high level assistance and cuing for completion of exercises in order to provide adequate level of stimulation and perturbation. Author allows pt as much opportunity as possible to perform independent righting strategies, only stepping in when pt is unable to prevent falling to floor. Pt showed progress with forward weight shift at end of session  following targeted interventions. Pt closely monitored throughout session for safe vitals response and to maximize patient safety during interventions.  Patient does have difficulty with anterior weight shift and frequency and loses balance in posterior direction.  Pt continues to demonstrate progress toward goals AEB progression of some  interventions this date either in volume or intensity.    OBJECTIVE IMPAIRMENTS: Abnormal gait, decreased activity tolerance, decreased balance, decreased endurance, decreased knowledge of condition, decreased knowledge of use of DME, and decreased strength.   ACTIVITY LIMITATIONS: standing, squatting, stairs, transfers, and locomotion level  PARTICIPATION LIMITATIONS: meal prep, shopping, and community activity  PERSONAL FACTORS: Age, Behavior pattern, and 3+ comorbidities: HTN, Chronic Diastolic Heart Failure, post polio,  are also affecting patient's functional outcome.   REHAB POTENTIAL: Good  CLINICAL DECISION MAKING: Evolving/moderate complexity  EVALUATION COMPLEXITY: Moderate  PLAN:  PT FREQUENCY: 2x/week  PT DURATION: 12 weeks  PLANNED INTERVENTIONS: 97750- Physical Performance Testing, 97110-Therapeutic exercises, 97530- Therapeutic activity, [id number]- Neuromuscular re-education, 97535- Self Care, [id number]- Manual therapy, and [id number]- Gait training  PLAN FOR NEXT SESSION:   Progress Hip flexor intervention, endurance and extensors. Initiate static balance interventions reflective of BERG deficits  Training keeping COM over BOS    Note: Portions of this document were prepared using Dragon voice recognition software and although reviewed may contain unintentional dictation errors in syntax, grammar, or spelling.  Lonni KATHEE Gainer PT ,DPT Physical Therapist- [place]  [facility]   9:37 AM 07/27/24

## 2024-07-29 ENCOUNTER — Ambulatory Visit: Admitting: Physical Therapy

## 2024-07-29 ENCOUNTER — Encounter: Payer: Self-pay | Admitting: Physical Therapy

## 2024-07-29 DIAGNOSIS — R262 Difficulty in walking, not elsewhere classified: Secondary | ICD-10-CM | POA: Diagnosis not present

## 2024-07-29 DIAGNOSIS — R2681 Unsteadiness on feet: Secondary | ICD-10-CM

## 2024-07-29 DIAGNOSIS — R2689 Other abnormalities of gait and mobility: Secondary | ICD-10-CM

## 2024-07-29 DIAGNOSIS — M6281 Muscle weakness (generalized): Secondary | ICD-10-CM

## 2024-07-29 NOTE — Therapy (Signed)
 OUTPATIENT PHYSICAL THERAPY NEURO TREATMENT    Patient Name: Eugene Martinez. MRN: [id number] DOB:11-27-1946, 78 y.o., male Today's Date: 07/29/2024   PCP: Sadie Manna, MD  REFERRING PROVIDER: Caffaro, Kaitlin T, PA-C   END OF SESSION:  PT End of Session - 07/29/24 1327     Visit Number 23    Number of Visits 25    Date for PT Re-Evaluation 08/04/24    Progress Note Due on Visit 30    PT Start Time 1316    PT Stop Time 1357    PT Time Calculation (min) 41 min    Equipment Utilized During Treatment Gait belt    Activity Tolerance Patient tolerated treatment well    Behavior During Therapy WFL for tasks assessed/performed                    Past Medical History:  Diagnosis Date   Allergic state    Amyloidosis (HCC)    Anemia    BPH (benign prostatic hyperplasia)    DDD (degenerative disc disease), lumbar    Dysplastic nevus 10/13/2013   Left posterior waistline paraspinal. Moderate to severe atypia, pattern borders on early evolving MIS, edge involved. Excised 03/31/2014, margins free.   Dysplastic nevus 12/19/2017   Right upper back medial. Mild atypia, lateral and deep margins involved.    Dysplastic nevus 11/28/2022   right prox lat thigh, Severe atypia, Excised 01/08/23   Dysrhythmia    Environmental and seasonal allergies    Gallop rhythm    GERD (gastroesophageal reflux disease)    Hypertension    Mild mitral regurgitation    a. by echo 10/2012   Pain    bil. 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 10/2012 Echo: EF 55-60%, severe LVH with speckling of myocardium -? amyloid. Mild MR, mod dil LA.   Past Surgical History:  Procedure Laterality Date   CIRCUMCISION  1984   COLONOSCOPY  03/2012   COLONOSCOPY WITH PROPOFOL  N/A 10/29/2017   Procedure: COLONOSCOPY WITH PROPOFOL ;  Surgeon: Gaylyn Gladis PENNER, MD;  Location: [facility] ENDOSCOPY;  Service: Endoscopy;  Laterality: N/A;   FOOT SURGERY     right foot   FOOT  SURGERY Right 2017   LEG SURGERIES     HX.OF MULTIPLE SURGERIES FOR LEG LENGTH DUE TO POLIO   LUMBAR LAMINECTOMY/DECOMPRESSION MICRODISCECTOMY N/A 01/26/2019   Procedure: LUMBAR LAMINECTOMY/DECOMPRESSION MICRODISCECTOMY 1 LEVEL L3-4;  Surgeon: Bluford Standing, MD;  Location: [facility] ORS;  Service: Neurosurgery;  Laterality: N/A;   NECK SURGERY     PILONIDAL CYST / SINUS EXCISION     Patient Active Problem List   Diagnosis Date Noted   Ambulatory dysfunction 10/22/2023   Prediabetes 02/05/2023   Anemia 08/01/2020   Elevated hemoglobin A1c 08/01/2020   Loss of memory 08/01/2020   Sesamoiditis 06/25/2019   S/P lumbar laminectomy 01/26/2019   Weakness of both lower limbs 10/24/2018   LVH (left ventricular hypertrophy) 08/29/2016   Allergy 02/20/2016   Amyloidosis (HCC) 02/20/2016   Thrombocytopenia (HCC) 11/23/2015   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   Airway hyperreactivity 05/07/2013   Benign fibroma of prostate 05/07/2013   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   Chronic diastolic heart failure (HCC) 11/18/2012   Proteinuria, Bence Jones 11/18/2012   Multiple allergies 10/29/2012   Hypertension 10/29/2012   RBBB (right bundle branch block with left anterior fascicular block) 10/29/2012   Proteinuria 10/29/2012    ONSET DATE: 04/22/24  REFERRING DIAG: R26.9 (ICD-10-CM) - Altered gait   THERAPY DIAG:  Difficulty in walking, not elsewhere classified  Unsteadiness on feet  Other abnormalities of gait and mobility  Muscle weakness (generalized)  Rationale for Evaluation and Treatment: Rehabilitation  SUBJECTIVE:                                                                                                                                                                                             SUBJECTIVE STATEMENT:   Pt reports doing well today. Pt denies any recent falls/stumbles since prior session. Pt denies any updates to medications or medical appointment since prior  session. Pt reports good compliance with HEP when time permits.   From Eval:Pt wife report he has been inconsistent with exercise since discharge. She also assist with all of subjective. Pt has been wall and furniture surfing to get around home. Pt had one pseudo fall in last 6 months; he went on a 4's into the recycling can but could not get up. Pt has not been following trough with his exercise. Pt has been using the cane almost all of the time outside of the house in comparison to rollator.  Patient and caregiver want to work on general strength and mobility. Pt caregiver has some concerns with getting rollator into and out of the car.  As well as getting patient into and out of the car.  Pt accompanied by: significant other  PERTINENT HISTORY: . 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 No  PRECAUTIONS: Fall  RED FLAGS: None   WEIGHT BEARING RESTRICTIONS: No  FALLS: Has patient fallen in last 6 months? 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   COGNITION: Overall cognitive status: Impaired and Mild cogintive impairment     POSTURE: rounded shoulders, forward head, and increased thoracic kyphosis  LOWER EXTREMITY ROM:   WNL for tasks assessed    LOWER EXTREMITY MMT:    MMT Right Eval Left Eval  Hip flexion 4 4  Hip  extension  Hip abduction 4+ 4+  Hip adduction 4+ 4+  Hip internal rotation    Hip external rotation    Knee flexion 4- 4-  Knee extension 4 4  Ankle dorsiflexion -   Ankle plantarflexion -   Ankle inversion    Ankle eversion    (Blank rows = not tested)  BED MOBILITY:  Not tested  TRANSFERS: Sit to stand: Complete Independence and on a few reps noted instability in standing  Assistive device utilized: None     Stand to sit: Complete Independence  Assistive device utilized: None      GAIT: Findings: Gait Characteristics: With cane pt does not use in classic 2 or 3 point pattern but uses sporadically, decreased step length- Right, and decreased step length- Left, Distance walked: 30 ft, Assistive device utilized:Single point cane, Level of assistance: CGA, and Comments: Describing gait with SPC in R UE   FUNCTIONAL TESTS:  5 times sit to stand: 15.55 sec, imbalance on a few Timed up and go (TUG): 22.62 sec with cane  6 minute walk test: Test visit 2  10 meter walk test: 22.67 sec with cane, 13.6 sec with rollator   Berg Balance Scale: Test visit 2    PATIENT SURVEYS:  LEFS  Extreme difficulty/unable (0), Quite a bit of difficulty (1), Moderate difficulty (2), Little difficulty (3), No difficulty (4) Survey date:  WIFE FILLS OUT 05/12/24  Any of your usual work, housework or school activities 3  2. Usual hobbies, recreational or sporting activities 2  3. Getting into/out of the bath 4  4. Walking between rooms 3  5. Putting on socks/shoes 2  6. Squatting  1  7. Lifting an object, like a bag of groceries from the floor 3  8. Performing light activities around your home 3  9. Performing heavy activities around your home 2  10. Getting into/out of a car 1  11. Walking 2 blocks 2  12. Walking 1 mile 1  13. Going up/down 10 stairs (1 flight) 1  14. Standing for 1 hour 2  15.  sitting for 1 hour 4  16. Running on even ground 1  17. Running on uneven ground 1  18. Making  sharp turns while running fast 1  19. Hopping  0  20. Rolling over in bed 2  Score total:  40                                                                                                                                 TREATMENT DATE: 07/29/24        NMR: To facilitate reeducation of movement, balance, posture, coordination, and/or proprioception/kinesthetic sense.  Stance on 10-15 degree ( largest wobble board with 2 inch plank under bottom side to reduce incline) incline x 5 min with rest intervals   Wide tandem x 45 sec ea LE  TA- To improve functional movements patterns for everyday tasks  Sidestepping 2 x 5 laps on airex beam with support  High knee march 2 x 10 reps   Lunges with UE support x 10 ea side  Forward and reverse gait without UE support or device but close CGA3 x 10 ft ea  STS 3 x 10 reps no UE assist, cue for forward weight shift   PATIENT EDUCATION: Education details: Pt educated throughout session about proper posture and technique with exercises. Improved exercise technique, movement at target joints, use of target muscles after min to mod verbal, visual, tactile cues. Person educated: Patient Education method: Explanation Education comprehension: verbalized understanding   HOME EXERCISE PROGRAM: Verbally added mini squat at bar to replace STS. No new handout provided, has handout from previous PT  Verbally added HS flexion with BTB to HEP. No new handout provided, has handout from previous PT    GOALS: Goals reviewed with patient? Yes  SHORT TERM GOALS: Target date: 06/09/2024   Patient will be independent in home exercise program to improve strength/mobility for better functional independence with ADLs. Baseline: No HEP currently 8/11: Diligently completing HEP  Goal status: INITIAL  LONG TERM GOALS: Target date: 08/04/2024  1.  Patient will complete five times sit to stand test in < 15 seconds without LOB indicating an increased LE  strength and improved balance. Baseline: 15.55 sec, imbalance on a few reps requiring CGA for safety 7/7:14.11 sec 8/11:15.62 sec no UE 8/20:16.86 sec no UE support  Goal status: ONGOING  2.  Patient will improve LEFS score to 50   to demonstrate statistically significant improvement in mobility and quality of life as it relates to their LE strength and mobility.  Baseline: 40 7/7:42 Goal status: ONGOING   3.  Patient will increase Berg Balance score by > 6 points to demonstrate decreased fall risk during functional activities. Baseline: 38 8/11:43 Goal status: INITIAL   4.   Patient will reduce timed up and go to <11 seconds to reduce fall risk and demonstrate improved transfer/gait ability. Baseline: 22.62 sec with SPC  8/11:15.39 sec with 4WW  Goal status: INITIAL  5.   Patient will increase 10 meter walk test to >1.79m/s as to improve gait speed for better community ambulation and to reduce fall risk. Baseline: .44 m/s w/ SPC, .73 m/s with rollator .75 m/s with rollator  Goal status: INITIAL  6.   Patient will increase six minute walk test distance by 150 ft or greater for progression to community ambulator and improve gait ability Baseline: progress note 7/7:750 ft, increased shuffling style gait from minute 2 and on as well as increased trunk flexion from this point on as well. 8/11: 750 ft with 4WW, improved gait quality ( no shuffling until last minute) and improved trunk flexion posture. Similar speed but less fatigue Goal status: INITIAL    ASSESSMENT:  CLINICAL IMPRESSION:    Continued with current plan of care as laid out in evaluation and recent prior sessions. Pt remains motivated to advance progress toward goals in order to maximize independence and safety at home. Pt requires high level assistance and cuing for completion of exercises in order to provide adequate level of stimulation and perturbation. Author allows pt as much opportunity as possible to perform  independent righting strategies, only stepping in when pt is unable to prevent falling to floor. Pt shows progress with ant weight shift but still has difficulty remembering cues. Pt had great form and quality with STS this date with min cues.   Pt  continues to demonstrate progress toward goals AEB progression of some interventions this date either in volume or intensity.    OBJECTIVE IMPAIRMENTS: Abnormal gait, decreased activity tolerance, decreased balance, decreased endurance, decreased knowledge of condition, decreased knowledge of use of DME, and decreased strength.   ACTIVITY LIMITATIONS: standing, squatting, stairs, transfers, and locomotion level  PARTICIPATION LIMITATIONS: meal prep, shopping, and community activity  PERSONAL FACTORS: Age, Behavior pattern, and 3+ comorbidities: HTN, Chronic Diastolic Heart Failure, post polio,  are also affecting patient's functional outcome.   REHAB POTENTIAL: Good  CLINICAL DECISION MAKING: Evolving/moderate complexity  EVALUATION COMPLEXITY: Moderate  PLAN:  PT FREQUENCY: 2x/week  PT DURATION: 12 weeks  PLANNED INTERVENTIONS: 97750- Physical Performance Testing, 97110-Therapeutic exercises, 97530- Therapeutic activity, [id number]- Neuromuscular re-education, 97535- Self Care, [id number]- Manual therapy, and [id number]- Gait training  PLAN FOR NEXT SESSION:   Progress Hip flexor intervention, endurance and extensors. Initiate static balance interventions reflective of BERG deficits  Training keeping COM over BOS    Note: Portions of this document were prepared using Dragon voice recognition software and although reviewed may contain unintentional dictation errors in syntax, grammar, or spelling.  Lonni KATHEE Gainer PT ,DPT Physical Therapist- [place]  [facility]   1:27 PM 07/29/24

## 2024-08-03 ENCOUNTER — Ambulatory Visit: Admitting: Physical Therapy

## 2024-08-03 DIAGNOSIS — R262 Difficulty in walking, not elsewhere classified: Secondary | ICD-10-CM

## 2024-08-03 DIAGNOSIS — R413 Other amnesia: Secondary | ICD-10-CM | POA: Diagnosis not present

## 2024-08-03 DIAGNOSIS — I10 Essential (primary) hypertension: Secondary | ICD-10-CM | POA: Diagnosis not present

## 2024-08-03 DIAGNOSIS — Z0000 Encounter for general adult medical examination without abnormal findings: Secondary | ICD-10-CM | POA: Diagnosis not present

## 2024-08-03 DIAGNOSIS — M6281 Muscle weakness (generalized): Secondary | ICD-10-CM

## 2024-08-03 DIAGNOSIS — R2689 Other abnormalities of gait and mobility: Secondary | ICD-10-CM

## 2024-08-03 DIAGNOSIS — G14 Postpolio syndrome: Secondary | ICD-10-CM | POA: Diagnosis not present

## 2024-08-03 DIAGNOSIS — Z9889 Other specified postprocedural states: Secondary | ICD-10-CM | POA: Diagnosis not present

## 2024-08-03 DIAGNOSIS — Z1331 Encounter for screening for depression: Secondary | ICD-10-CM | POA: Diagnosis not present

## 2024-08-03 DIAGNOSIS — D649 Anemia, unspecified: Secondary | ICD-10-CM | POA: Diagnosis not present

## 2024-08-03 DIAGNOSIS — R2681 Unsteadiness on feet: Secondary | ICD-10-CM

## 2024-08-03 NOTE — Therapy (Signed)
 OUTPATIENT PHYSICAL THERAPY NEURO TREATMENT    Patient Name: Eugene Martinez. MRN: [id number] DOB:23-Dec-1945, 78 y.o., male Today's Date: 08/03/2024   PCP: Sadie Manna, MD  REFERRING PROVIDER: Abe Erlinda DASEN, PA-C   END OF SESSION:  PT End of Session - 08/03/24 0934     Visit Number 24    Number of Visits 25    Date for PT Re-Evaluation 08/04/24    Progress Note Due on Visit 30    PT Start Time 0931    PT Stop Time 1012    PT Time Calculation (min) 41 min    Equipment Utilized During Treatment Gait belt    Activity Tolerance Patient tolerated treatment well    Behavior During Therapy WFL for tasks assessed/performed                     Past Medical History:  Diagnosis Date   Allergic state    Amyloidosis (HCC)    Anemia    BPH (benign prostatic hyperplasia)    DDD (degenerative disc disease), lumbar    Dysplastic nevus 10/13/2013   Left posterior waistline paraspinal. Moderate to severe atypia, pattern borders on early evolving MIS, edge involved. Excised 03/31/2014, margins free.   Dysplastic nevus 12/19/2017   Right upper back medial. Mild atypia, lateral and deep margins involved.    Dysplastic nevus 11/28/2022   right prox lat thigh, Severe atypia, Excised 01/08/23   Dysrhythmia    Environmental and seasonal allergies    Gallop rhythm    GERD (gastroesophageal reflux disease)    Hypertension    Mild mitral regurgitation    a. by echo 10/2012   Pain    bil. 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 10/2012 Echo: EF 55-60%, severe LVH with speckling of myocardium -? amyloid. Mild MR, mod dil LA.   Past Surgical History:  Procedure Laterality Date   CIRCUMCISION  1984   COLONOSCOPY  03/2012   COLONOSCOPY WITH PROPOFOL  N/A 10/29/2017   Procedure: COLONOSCOPY WITH PROPOFOL ;  Surgeon: Gaylyn Gladis PENNER, MD;  Location: [facility] ENDOSCOPY;  Service: Endoscopy;  Laterality: N/A;   FOOT SURGERY     right foot   FOOT  SURGERY Right 2017   LEG SURGERIES     HX.OF MULTIPLE SURGERIES FOR LEG LENGTH DUE TO POLIO   LUMBAR LAMINECTOMY/DECOMPRESSION MICRODISCECTOMY N/A 01/26/2019   Procedure: LUMBAR LAMINECTOMY/DECOMPRESSION MICRODISCECTOMY 1 LEVEL L3-4;  Surgeon: Bluford Standing, MD;  Location: [facility] ORS;  Service: Neurosurgery;  Laterality: N/A;   NECK SURGERY     PILONIDAL CYST / SINUS EXCISION     Patient Active Problem List   Diagnosis Date Noted   Ambulatory dysfunction 10/22/2023   Prediabetes 02/05/2023   Anemia 08/01/2020   Elevated hemoglobin A1c 08/01/2020   Loss of memory 08/01/2020   Sesamoiditis 06/25/2019   S/P lumbar laminectomy 01/26/2019   Weakness of both lower limbs 10/24/2018   LVH (left ventricular hypertrophy) 08/29/2016   Allergy 02/20/2016   Amyloidosis (HCC) 02/20/2016   Thrombocytopenia (HCC) 11/23/2015   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   Airway hyperreactivity 05/07/2013   Benign fibroma of prostate 05/07/2013   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   Chronic diastolic heart failure (HCC) 11/18/2012   Proteinuria, Bence Jones 11/18/2012   Multiple allergies 10/29/2012   Hypertension 10/29/2012   RBBB (right bundle branch block with left anterior fascicular block) 10/29/2012   Proteinuria 10/29/2012    ONSET DATE:  04/22/24  REFERRING DIAG: R26.9 (ICD-10-CM) - Altered gait   THERAPY DIAG:  Difficulty in walking, not elsewhere classified  Unsteadiness on feet  Other abnormalities of gait and mobility  Muscle weakness (generalized)  Rationale for Evaluation and Treatment: Rehabilitation  SUBJECTIVE:                                                                                                                                                                                             SUBJECTIVE STATEMENT:   Pt reports doing well today. Pt denies any recent falls/stumbles since prior session. Pt denies any updates to medications or medical appointment since prior  session. Pt reports good compliance with HEP when time permits.   From Eval:Pt wife report he has been inconsistent with exercise since discharge. She also assist with all of subjective. Pt has been wall and furniture surfing to get around home. Pt had one pseudo fall in last 6 months; he went on a 4's into the recycling can but could not get up. Pt has not been following trough with his exercise. Pt has been using the cane almost all of the time outside of the house in comparison to rollator.  Patient and caregiver want to work on general strength and mobility. Pt caregiver has some concerns with getting rollator into and out of the car.  As well as getting patient into and out of the car.  Pt accompanied by: significant other  PERTINENT HISTORY: . 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 No  PRECAUTIONS: Fall  RED FLAGS: None   WEIGHT BEARING RESTRICTIONS: No  FALLS: Has patient fallen in last 6 months? 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   COGNITION: Overall cognitive status: Impaired and Mild cogintive impairment     POSTURE: rounded shoulders, forward head, and increased thoracic kyphosis  LOWER EXTREMITY ROM:   WNL for tasks assessed    LOWER EXTREMITY MMT:    MMT Right Eval Left Eval  Hip flexion 4 4  Hip  extension  Hip abduction 4+ 4+  Hip adduction 4+ 4+  Hip internal rotation    Hip external rotation    Knee flexion 4- 4-  Knee extension 4 4  Ankle dorsiflexion -   Ankle plantarflexion -   Ankle inversion    Ankle eversion    (Blank rows = not tested)  BED MOBILITY:  Not tested  TRANSFERS: Sit to stand: Complete Independence and on a few reps noted instability in standing  Assistive device utilized: None     Stand to sit: Complete Independence  Assistive device utilized: None      GAIT: Findings: Gait Characteristics: With cane pt does not use in classic 2 or 3 point pattern but uses sporadically, decreased step length- Right, and decreased step length- Left, Distance walked: 30 ft, Assistive device utilized:Single point cane, Level of assistance: CGA, and Comments: Describing gait with SPC in R UE   FUNCTIONAL TESTS:  5 times sit to stand: 15.55 sec, imbalance on a few Timed up and go (TUG): 22.62 sec with cane  6 minute walk test: Test visit 2  10 meter walk test: 22.67 sec with cane, 13.6 sec with rollator   Berg Balance Scale: Test visit 2    PATIENT SURVEYS:  LEFS  Extreme difficulty/unable (0), Quite a bit of difficulty (1), Moderate difficulty (2), Little difficulty (3), No difficulty (4) Survey date:  WIFE FILLS OUT 05/12/24  Any of your usual work, housework or school activities 3  2. Usual hobbies, recreational or sporting activities 2  3. Getting into/out of the bath 4  4. Walking between rooms 3  5. Putting on socks/shoes 2  6. Squatting  1  7. Lifting an object, like a bag of groceries from the floor 3  8. Performing light activities around your home 3  9. Performing heavy activities around your home 2  10. Getting into/out of a car 1  11. Walking 2 blocks 2  12. Walking 1 mile 1  13. Going up/down 10 stairs (1 flight) 1  14. Standing for 1 hour 2  15.  sitting for 1 hour 4  16. Running on even ground 1  17. Running on uneven ground 1  18. Making  sharp turns while running fast 1  19. Hopping  0  20. Rolling over in bed 2  Score total:  40                                                                                                                                 TREATMENT DATE: 08/03/24   NMR: To facilitate reeducation of movement, balance, posture, coordination, and/or proprioception/kinesthetic sense.  Stance on 10-15 degree ( largest wobble board with 2 inch plank under bottom side to reduce incline) incline x 5 min with rest intervals   3/4 romberg 3 x 30 sec  TA- To improve functional movements patterns for everyday tasks   STS 3 x 10 reps no  UE assist, cue for forward weight shift   Forward and reverse gait without UE support or device but close CGA3 x 10 ft ea  Sidestepping 3 x 12 ft no UE  High knee march 2 x 10 reps to second step on stairs  Step ups x 10 ea side with upper extremity support   TE- To improve strength, endurance, mobility, and function of specific targeted muscle groups or improve joint range of motion or improve muscle flexibility  Seated Laq with 2 sec hold with 2.5# AW 2 x 10   PATIENT EDUCATION: Education details: Pt educated throughout session about proper posture and technique with exercises. Improved exercise technique, movement at target joints, use of target muscles after min to mod verbal, visual, tactile cues. Person educated: Patient Education method: Explanation Education comprehension: verbalized understanding   HOME EXERCISE PROGRAM: Verbally added mini squat at bar to replace STS. No new handout provided, has handout from previous PT  Verbally added HS flexion with BTB to HEP. No new handout provided, has handout from previous PT    GOALS: Goals reviewed with patient? Yes  SHORT TERM GOALS: Target date: 06/09/2024   Patient will be independent in home exercise program to improve strength/mobility for better functional independence with ADLs. Baseline: No HEP currently  8/11: Diligently completing HEP  Goal status: INITIAL  LONG TERM GOALS: Target date: 08/04/2024  1.  Patient will complete five times sit to stand test in < 15 seconds without LOB indicating an increased LE strength and improved balance. Baseline: 15.55 sec, imbalance on a few reps requiring CGA for safety 7/7:14.11 sec 8/11:15.62 sec no UE 8/20:16.86 sec no UE support  Goal status: ONGOING  2.  Patient will improve LEFS score to 50   to demonstrate statistically significant improvement in mobility and quality of life as it relates to their LE strength and mobility.  Baseline: 40 7/7:42 Goal status: ONGOING   3.  Patient will increase Berg Balance score by > 6 points to demonstrate decreased fall risk during functional activities. Baseline: 38 8/11:43 Goal status: INITIAL   4.   Patient will reduce timed up and go to <11 seconds to reduce fall risk and demonstrate improved transfer/gait ability. Baseline: 22.62 sec with SPC  8/11:15.39 sec with 4WW  Goal status: INITIAL  5.   Patient will increase 10 meter walk test to >1.4m/s as to improve gait speed for better community ambulation and to reduce fall risk. Baseline: .44 m/s w/ SPC, .73 m/s with rollator .75 m/s with rollator  Goal status: INITIAL  6.   Patient will increase six minute walk test distance by 150 ft or greater for progression to community ambulator and improve gait ability Baseline: progress note 7/7:750 ft, increased shuffling style gait from minute 2 and on as well as increased trunk flexion from this point on as well. 8/11: 750 ft with 4WW, improved gait quality ( no shuffling until last minute) and improved trunk flexion posture. Similar speed but less fatigue Goal status: INITIAL    ASSESSMENT:  CLINICAL IMPRESSION:    Continued with current plan of care as laid out in evaluation and recent prior sessions. Pt remains motivated to advance progress toward goals in order to maximize independence and safety at  home. Pt requires high level assistance and cuing for completion of exercises in order to provide adequate level of stimulation and perturbation. Author allows pt as much opportunity as possible to perform independent righting strategies, only stepping in when pt  is unable to prevent falling to floor.Pt had great form and quality with STS this date with min cues.  Goal assessment and recert next date. Pt continues to demonstrate progress toward goals AEB progression of some interventions this date either in volume or intensity.    OBJECTIVE IMPAIRMENTS: Abnormal gait, decreased activity tolerance, decreased balance, decreased endurance, decreased knowledge of condition, decreased knowledge of use of DME, and decreased strength.   ACTIVITY LIMITATIONS: standing, squatting, stairs, transfers, and locomotion level  PARTICIPATION LIMITATIONS: meal prep, shopping, and community activity  PERSONAL FACTORS: Age, Behavior pattern, and 3+ comorbidities: HTN, Chronic Diastolic Heart Failure, post polio,  are also affecting patient's functional outcome.   REHAB POTENTIAL: Good  CLINICAL DECISION MAKING: Evolving/moderate complexity  EVALUATION COMPLEXITY: Moderate  PLAN:  PT FREQUENCY: 2x/week  PT DURATION: 12 weeks  PLANNED INTERVENTIONS: 97750- Physical Performance Testing, 97110-Therapeutic exercises, 97530- Therapeutic activity, [id number]- Neuromuscular re-education, 97535- Self Care, [id number]- Manual therapy, and [id number]- Gait training  PLAN FOR NEXT SESSION:   Progress Hip flexor intervention, endurance and extensors. Initiate static balance interventions reflective of BERG deficits  Training keeping COM over BOS   Note: Portions of this document were prepared using Dragon voice recognition software and although reviewed may contain unintentional dictation errors in syntax, grammar, or spelling.  Lonni KATHEE Gainer PT ,DPT Physical Therapist- [place]  [facility]    9:34 AM 08/03/24

## 2024-08-05 ENCOUNTER — Ambulatory Visit: Admitting: Physical Therapy

## 2024-08-05 DIAGNOSIS — M6281 Muscle weakness (generalized): Secondary | ICD-10-CM

## 2024-08-05 DIAGNOSIS — R2681 Unsteadiness on feet: Secondary | ICD-10-CM

## 2024-08-05 DIAGNOSIS — R2689 Other abnormalities of gait and mobility: Secondary | ICD-10-CM

## 2024-08-05 DIAGNOSIS — R262 Difficulty in walking, not elsewhere classified: Secondary | ICD-10-CM

## 2024-08-05 NOTE — Therapy (Signed)
 OUTPATIENT PHYSICAL THERAPY NEURO TREATMENT/ RE-CERTIFICATION NOTE      Patient Name: Eugene Martinez. MRN: [id number] DOB:06-14-1946, 78 y.o., male Today's Date: 08/05/2024   PCP: Sadie Manna, MD  REFERRING PROVIDER: Caffaro, Kaitlin T, PA-C   END OF SESSION:  PT End of Session - 08/05/24 0924     Visit Number 25    Number of Visits 41    Date for PT Re-Evaluation 09/30/24    Progress Note Due on Visit 30    PT Start Time 0931    PT Stop Time 1011    PT Time Calculation (min) 40 min    Equipment Utilized During Treatment Gait belt    Activity Tolerance Patient tolerated treatment well    Behavior During Therapy WFL for tasks assessed/performed                      Past Medical History:  Diagnosis Date   Allergic state    Amyloidosis (HCC)    Anemia    BPH (benign prostatic hyperplasia)    DDD (degenerative disc disease), lumbar    Dysplastic nevus 10/13/2013   Left posterior waistline paraspinal. Moderate to severe atypia, pattern borders on early evolving MIS, edge involved. Excised 03/31/2014, margins free.   Dysplastic nevus 12/19/2017   Right upper back medial. Mild atypia, lateral and deep margins involved.    Dysplastic nevus 11/28/2022   right prox lat thigh, Severe atypia, Excised 01/08/23   Dysrhythmia    Environmental and seasonal allergies    Gallop rhythm    GERD (gastroesophageal reflux disease)    Hypertension    Mild mitral regurgitation    a. by echo 10/2012   Pain    bil. 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 10/2012 Echo: EF 55-60%, severe LVH with speckling of myocardium -? amyloid. Mild MR, mod dil LA.   Past Surgical History:  Procedure Laterality Date   CIRCUMCISION  1984   COLONOSCOPY  03/2012   COLONOSCOPY WITH PROPOFOL  N/A 10/29/2017   Procedure: COLONOSCOPY WITH PROPOFOL ;  Surgeon: Gaylyn Gladis PENNER, MD;  Location: [facility] ENDOSCOPY;  Service: Endoscopy;  Laterality: N/A;   FOOT SURGERY      right foot   FOOT SURGERY Right 2017   LEG SURGERIES     HX.OF MULTIPLE SURGERIES FOR LEG LENGTH DUE TO POLIO   LUMBAR LAMINECTOMY/DECOMPRESSION MICRODISCECTOMY N/A 01/26/2019   Procedure: LUMBAR LAMINECTOMY/DECOMPRESSION MICRODISCECTOMY 1 LEVEL L3-4;  Surgeon: Bluford Standing, MD;  Location: [facility] ORS;  Service: Neurosurgery;  Laterality: N/A;   NECK SURGERY     PILONIDAL CYST / SINUS EXCISION     Patient Active Problem List   Diagnosis Date Noted   Ambulatory dysfunction 10/22/2023   Prediabetes 02/05/2023   Anemia 08/01/2020   Elevated hemoglobin A1c 08/01/2020   Loss of memory 08/01/2020   Sesamoiditis 06/25/2019   S/P lumbar laminectomy 01/26/2019   Weakness of both lower limbs 10/24/2018   LVH (left ventricular hypertrophy) 08/29/2016   Allergy 02/20/2016   Amyloidosis (HCC) 02/20/2016   Thrombocytopenia (HCC) 11/23/2015   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   Airway hyperreactivity 05/07/2013   Benign fibroma of prostate 05/07/2013   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   Chronic diastolic heart failure (HCC) 11/18/2012   Proteinuria, Bence Jones 11/18/2012   Multiple allergies 10/29/2012   Hypertension 10/29/2012   RBBB (right bundle branch block with left anterior fascicular block) 10/29/2012   Proteinuria 10/29/2012  ONSET DATE: 04/22/24  REFERRING DIAG: R26.9 (ICD-10-CM) - Altered gait   THERAPY DIAG:  Difficulty in walking, not elsewhere classified  Unsteadiness on feet  Other abnormalities of gait and mobility  Muscle weakness (generalized)  Rationale for Evaluation and Treatment: Rehabilitation  SUBJECTIVE:                                                                                                                                                                                             SUBJECTIVE STATEMENT:   Pt reports doing well today. Pt denies any recent falls/stumbles since prior session. Pt denies any updates to medications or medical  appointment since prior session. Pt reports good compliance with HEP when time permits.   From Eval:Pt wife report he has been inconsistent with exercise since discharge. She also assist with all of subjective. Pt has been wall and furniture surfing to get around home. Pt had one pseudo fall in last 6 months; he went on a 4's into the recycling can but could not get up. Pt has not been following trough with his exercise. Pt has been using the cane almost all of the time outside of the house in comparison to rollator.  Patient and caregiver want to work on general strength and mobility. Pt caregiver has some concerns with getting rollator into and out of the car.  As well as getting patient into and out of the car.  Pt accompanied by: significant other  PERTINENT HISTORY: . 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 No  PRECAUTIONS: Fall  RED FLAGS: None   WEIGHT BEARING RESTRICTIONS: No  FALLS: Has patient fallen in last 6 months? 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   COGNITION: Overall cognitive status: Impaired and Mild cogintive impairment     POSTURE: rounded shoulders, forward head, and increased thoracic kyphosis  LOWER EXTREMITY ROM:   WNL for tasks assessed    LOWER EXTREMITY MMT:    MMT Right Eval Left Eval  Hip  flexion 4 4  Hip  extension    Hip abduction 4+ 4+  Hip adduction 4+ 4+  Hip internal rotation    Hip external rotation    Knee flexion 4- 4-  Knee extension 4 4  Ankle dorsiflexion -   Ankle plantarflexion -   Ankle inversion    Ankle eversion    (Blank rows = not tested)  BED MOBILITY:  Not tested  TRANSFERS: Sit to stand: Complete Independence and on a few reps noted instability in standing  Assistive device utilized: None     Stand to sit: Complete Independence  Assistive device utilized: None      GAIT: Findings: Gait Characteristics: With cane pt does not use in classic 2 or 3 point pattern but uses sporadically, decreased step length- Right, and decreased step length- Left, Distance walked: 30 ft, Assistive device utilized:Single point cane, Level of assistance: CGA, and Comments: Describing gait with SPC in R UE   FUNCTIONAL TESTS:  5 times sit to stand: 15.55 sec, imbalance on a few Timed up and go (TUG): 22.62 sec with cane  6 minute walk test: Test visit 2  10 meter walk test: 22.67 sec with cane, 13.6 sec with rollator   Berg Balance Scale: Test visit 2    PATIENT SURVEYS:  LEFS  Extreme difficulty/unable (0), Quite a bit of difficulty (1), Moderate difficulty (2), Little difficulty (3), No difficulty (4) Survey date:  WIFE FILLS OUT 05/12/24  Any of your usual work, housework or school activities 3  2. Usual hobbies, recreational or sporting activities 2  3. Getting into/out of the bath 4  4. Walking between rooms 3  5. Putting on socks/shoes 2  6. Squatting  1  7. Lifting an object, like a bag of groceries from the floor 3  8. Performing light activities around your home 3  9. Performing heavy activities around your home 2  10. Getting into/out of a car 1  11. Walking 2 blocks 2  12. Walking 1 mile 1  13. Going up/down 10 stairs (1 flight) 1  14. Standing for 1 hour 2  15.  sitting for 1 hour 4  16. Running on even ground 1  17. Running on uneven ground  1  18. Making sharp turns while running fast 1  19. Hopping  0  20. Rolling over in bed 2  Score total:  40                                                                                                                                 TREATMENT DATE: 08/05/24   Physical Performance Test or Measurement: a  physical performance test(s) or measurement (eg,  musculoskeletal, functional capacity), with written report,  each 15 mins   Five times Sit to Stand Test (FTSS)  TIME: 13.95  sec no UE assist   Cut off scores indicative of increased fall risk: >12 sec CVA, >16 sec PD, >13 sec vestibular (  ANPTA Core Set of Outcome Measures for Adults with Neurologic Conditions, 2018)   Patient demonstrates increased fall risk as noted by score of  46 /56 on Berg Balance Scale.  (<36= high risk for falls, close to 100%; 37-45 significant >80%; 46-51 moderate >50%; 52-55 lower >25%)   [facility] PT Assessment - 08/05/24 0001       Berg Balance Test   Sit to Stand Able to stand without using hands and stabilize independently    Standing Unsupported Able to stand safely 2 minutes    Sitting with Back Unsupported but Feet Supported on Floor or Stool Able to sit safely and securely 2 minutes    Stand to Sit Sits safely with minimal use of hands    Transfers Able to transfer safely, minor use of hands    Standing Unsupported with Eyes Closed Able to stand 10 seconds safely    Standing Unsupported with Feet Together Able to place feet together independently and stand 1 minute safely    From Standing, Reach Forward with Outstretched Arm Can reach forward >12 cm safely (5)    From Standing Position, Pick up Object from Floor Able to pick up shoe, needs supervision    From Standing Position, Turn to Look Behind Over each Shoulder Looks behind one side only/other side shows less weight shift    Turn 360 Degrees Able to turn 360 degrees safely but slowly    Standing Unsupported, Alternately Place Feet on  Step/Stool Able to stand independently and complete 8 steps >20 seconds    Standing Unsupported, One Foot in Front Able to plae foot ahead of the other independently and hold 30 seconds    Standing on One Leg Tries to lift leg/unable to hold 3 seconds but remains standing independently    Total Score 46           PT instructed pt in TUG: 15.1 sec with RW 14.7 sec with no AD  sec ( >13.5 sec indicates increased fall risk)    6 Min Walk Test:  Instructed patient to ambulate as quickly and as safely as possible for 6 minutes using LRAD. Patient was allowed to take standing rest breaks without stopping the test, but if the patient required a sitting rest break the clock would be stopped and the test would be over.  Results:917 ft with 4WW; flexed posture dominates gait and causing shuffling last 1.5 min   Results indicate that the patient has reduced endurance with ambulation compared to age matched norms.  Age Matched Norms (in meters): 44-69 yo M: 72 F: 13, 37-79 yo M: 69 F: 471, 34-89 yo M: 417 F: 392 MDC: 58.21 meters (190.98 feet) or 50 meters (ANPTA Core Set of Outcome Measures for Adults with Neurologic Conditions, 2018)     PATIENT EDUCATION: Education details: Pt educated throughout session about proper posture and technique with exercises. Improved exercise technique, movement at target joints, use of target muscles after min to mod verbal, visual, tactile cues. Person educated: Patient Education method: Explanation Education comprehension: verbalized understanding   HOME EXERCISE PROGRAM: Verbally added mini squat at bar to replace STS. No new handout provided, has handout from previous PT  Verbally added HS flexion with BTB to HEP. No new handout provided, has handout from previous PT    GOALS: Goals reviewed with patient? Yes  SHORT TERM GOALS: Target date: 06/09/2024   Patient will be independent in home exercise program to improve strength/mobility for better  functional independence with  ADLs. Baseline: No HEP currently 8/11: Diligently completing HEP  Goal status: INITIAL  LONG TERM GOALS: Target date: 08/04/2024  1.  Patient will complete five times sit to stand test in < 15 seconds without LOB indicating an increased LE strength and improved balance. Baseline: 15.55 sec, imbalance on a few reps requiring CGA for safety 7/7:14.11 sec 8/11:15.62 sec no UE 8/20:16.86 sec no UE support 8/27:13.95 sec no UE support Goal status: MET  2.  Patient will improve LEFS score to 50   to demonstrate statistically significant improvement in mobility and quality of life as it relates to their LE strength and mobility.  Baseline: 40 7/7:42 8/27:41 Goal status: ONGOING   3.  Patient will increase Berg Balance score by > 6 points to demonstrate decreased fall risk during functional activities. Baseline: 38 8/11:43 8/27: 46 Goal status: MET   4.   Patient will reduce timed up and go to <11 seconds to reduce fall risk and demonstrate improved transfer/gait ability. Baseline: 22.62 sec with SPC  8/11:15.39 sec with 4WW 8/27: 15.1 sec with RW 14.7 sec with no AD  Goal status: ONGOING  5.   Patient will increase 10 meter walk test to >1.89m/s as to improve gait speed for better community ambulation and to reduce fall risk. Baseline: .44 m/s w/ SPC, .73 m/s with rollator .75 m/s with rollator  Goal status: ONGOING  6.   Patient will increase six minute walk test distance by 150 ft or greater for progression to community ambulator and improve gait ability Baseline: progress note 7/7:750 ft, increased shuffling style gait from minute 2 and on as well as increased trunk flexion from this point on as well. 8/11: 750 ft with 4WW, improved gait quality ( no shuffling until last minute) and improved trunk flexion posture. Similar speed but less fatigue 8/27: 917 ft with 4WW; flexed posture dominates gait and causing shuffling last 1.5 min  Goal status:  MET    ASSESSMENT:  CLINICAL IMPRESSION:     Patient presents with good motivation for progress note this date.  Patient shows progress toward all of his long-term goals meeting his 6-minute walk test, Berg balance test, and 5 time sit to stand scores all indicating improved lower extremity strength and function as well as decreased risk of falls.  Patient still working on gait speed, timed up and go, and lower extremity functional scale goals.  Likely patient's confidence with many activities is limited as well as impacted by influence from his wife and assisting with scoring lower extremity functional scale.  Plan to continue to progress toward patient's goals continue to work on balance and lower extremity strength as well as gait speed both with and without his rolling walker.Patient's condition has the potential to improve in response to therapy. Maximum improvement is yet to be obtained. The anticipated improvement is attainable and reasonable in a generally predictable time.  Pt will continue to benefit from skilled physical therapy intervention to address impairments, improve QOL, and attain therapy goals.     OBJECTIVE IMPAIRMENTS: Abnormal gait, decreased activity tolerance, decreased balance, decreased endurance, decreased knowledge of condition, decreased knowledge of use of DME, and decreased strength.   ACTIVITY LIMITATIONS: standing, squatting, stairs, transfers, and locomotion level  PARTICIPATION LIMITATIONS: meal prep, shopping, and community activity  PERSONAL FACTORS: Age, Behavior pattern, and 3+ comorbidities: HTN, Chronic Diastolic Heart Failure, post polio,  are also affecting patient's functional outcome.   REHAB POTENTIAL: Good  CLINICAL DECISION MAKING: Evolving/moderate complexity  EVALUATION COMPLEXITY: Moderate  PLAN:  PT FREQUENCY: 2x/week  PT DURATION: 12 weeks  PLANNED INTERVENTIONS: 97750- Physical Performance Testing, 97110-Therapeutic exercises,  97530- Therapeutic activity, [id number]- Neuromuscular re-education, 97535- Self Care, [id number]- Manual therapy, and [id number]- Gait training  PLAN FOR NEXT SESSION:   Progress Hip flexor intervention, endurance and extensors. Initiate static balance interventions reflective of BERG deficits  Training keeping COM over BOS   Note: Portions of this document were prepared using Dragon voice recognition software and although reviewed may contain unintentional dictation errors in syntax, grammar, or spelling.  Lonni KATHEE Gainer PT ,DPT Physical Therapist- [facility]   1:02 PM 08/05/24

## 2024-08-11 ENCOUNTER — Ambulatory Visit: Admitting: Physical Therapy

## 2024-08-11 DIAGNOSIS — R2689 Other abnormalities of gait and mobility: Secondary | ICD-10-CM | POA: Diagnosis not present

## 2024-08-11 DIAGNOSIS — M6281 Muscle weakness (generalized): Secondary | ICD-10-CM | POA: Diagnosis not present

## 2024-08-11 DIAGNOSIS — R2681 Unsteadiness on feet: Secondary | ICD-10-CM | POA: Diagnosis not present

## 2024-08-11 DIAGNOSIS — R262 Difficulty in walking, not elsewhere classified: Secondary | ICD-10-CM | POA: Insufficient documentation

## 2024-08-11 DIAGNOSIS — R278 Other lack of coordination: Secondary | ICD-10-CM | POA: Diagnosis not present

## 2024-08-11 NOTE — Therapy (Signed)
 OUTPATIENT PHYSICAL THERAPY NEURO TREATMENT     Patient Name: Eugene Martinez. MRN: [id number] DOB:Sep 29, 1946, 78 y.o., male Today's Date: 08/11/2024   PCP: Sadie Manna, MD  REFERRING PROVIDER: Caffaro, Kaitlin T, PA-C   END OF SESSION:  PT End of Session - 08/11/24 1133     Visit Number 26    Number of Visits 41    Date for PT Re-Evaluation 09/30/24    Progress Note Due on Visit 30    PT Start Time 1125    PT Stop Time 1208    PT Time Calculation (min) 43 min    Equipment Utilized During Treatment Gait belt    Activity Tolerance Patient tolerated treatment well    Behavior During Therapy WFL for tasks assessed/performed                       Past Medical History:  Diagnosis Date   Allergic state    Amyloidosis (HCC)    Anemia    BPH (benign prostatic hyperplasia)    DDD (degenerative disc disease), lumbar    Dysplastic nevus 10/13/2013   Left posterior waistline paraspinal. Moderate to severe atypia, pattern borders on early evolving MIS, edge involved. Excised 03/31/2014, margins free.   Dysplastic nevus 12/19/2017   Right upper back medial. Mild atypia, lateral and deep margins involved.    Dysplastic nevus 11/28/2022   right prox lat thigh, Severe atypia, Excised 01/08/23   Dysrhythmia    Environmental and seasonal allergies    Gallop rhythm    GERD (gastroesophageal reflux disease)    Hypertension    Mild mitral regurgitation    a. by echo 10/2012   Pain    bil. 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 10/2012 Echo: EF 55-60%, severe LVH with speckling of myocardium -? amyloid. Mild MR, mod dil LA.   Past Surgical History:  Procedure Laterality Date   CIRCUMCISION  1984   COLONOSCOPY  03/2012   COLONOSCOPY WITH PROPOFOL  N/A 10/29/2017   Procedure: COLONOSCOPY WITH PROPOFOL ;  Surgeon: Gaylyn Gladis PENNER, MD;  Location: [facility] ENDOSCOPY;  Service: Endoscopy;  Laterality: N/A;   FOOT SURGERY     right foot   FOOT  SURGERY Right 2017   LEG SURGERIES     HX.OF MULTIPLE SURGERIES FOR LEG LENGTH DUE TO POLIO   LUMBAR LAMINECTOMY/DECOMPRESSION MICRODISCECTOMY N/A 01/26/2019   Procedure: LUMBAR LAMINECTOMY/DECOMPRESSION MICRODISCECTOMY 1 LEVEL L3-4;  Surgeon: Bluford Standing, MD;  Location: [facility] ORS;  Service: Neurosurgery;  Laterality: N/A;   NECK SURGERY     PILONIDAL CYST / SINUS EXCISION     Patient Active Problem List   Diagnosis Date Noted   Ambulatory dysfunction 10/22/2023   Prediabetes 02/05/2023   Anemia 08/01/2020   Elevated hemoglobin A1c 08/01/2020   Loss of memory 08/01/2020   Sesamoiditis 06/25/2019   S/P lumbar laminectomy 01/26/2019   Weakness of both lower limbs 10/24/2018   LVH (left ventricular hypertrophy) 08/29/2016   Allergy 02/20/2016   Amyloidosis (HCC) 02/20/2016   Thrombocytopenia (HCC) 11/23/2015   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   Airway hyperreactivity 05/07/2013   Benign fibroma of prostate 05/07/2013   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   Chronic diastolic heart failure (HCC) 11/18/2012   Proteinuria, Bence Jones 11/18/2012   Multiple allergies 10/29/2012   Hypertension 10/29/2012   RBBB (right bundle branch block with left anterior fascicular block) 10/29/2012   Proteinuria 10/29/2012  ONSET DATE: 04/22/24  REFERRING DIAG: R26.9 (ICD-10-CM) - Altered gait   THERAPY DIAG:  Difficulty in walking, not elsewhere classified  Unsteadiness on feet  Other abnormalities of gait and mobility  Muscle weakness (generalized)  Rationale for Evaluation and Treatment: Rehabilitation  SUBJECTIVE:                                                                                                                                                                                             SUBJECTIVE STATEMENT:   Pt reports doing well today. Pt denies any recent falls/stumbles since prior session. Pt denies any updates to medications or medical appointment since prior  session. Pt reports good compliance with HEP when time permits.   From Eval:Pt wife report he has been inconsistent with exercise since discharge. She also assist with all of subjective. Pt has been wall and furniture surfing to get around home. Pt had one pseudo fall in last 6 months; he went on a 4's into the recycling can but could not get up. Pt has not been following trough with his exercise. Pt has been using the cane almost all of the time outside of the house in comparison to rollator.  Patient and caregiver want to work on general strength and mobility. Pt caregiver has some concerns with getting rollator into and out of the car.  As well as getting patient into and out of the car.  Pt accompanied by: significant other  PERTINENT HISTORY: . 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 No  PRECAUTIONS: Fall  RED FLAGS: None   WEIGHT BEARING RESTRICTIONS: No  FALLS: Has patient fallen in last 6 months? 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   COGNITION: Overall cognitive status: Impaired and Mild cogintive impairment     POSTURE: rounded shoulders, forward head, and increased thoracic kyphosis  LOWER EXTREMITY ROM:   WNL for tasks assessed    LOWER EXTREMITY MMT:    MMT Right Eval Left Eval  Hip flexion 4 4  Hip  extension    Hip abduction 4+ 4+  Hip adduction 4+ 4+  Hip internal rotation    Hip external rotation    Knee flexion 4- 4-  Knee extension 4 4  Ankle dorsiflexion -   Ankle plantarflexion -   Ankle inversion    Ankle eversion    (Blank rows = not tested)  BED MOBILITY:  Not tested  TRANSFERS: Sit to stand: Complete Independence and on a few reps noted instability in standing  Assistive device utilized: None     Stand to sit: Complete Independence  Assistive device utilized: None      GAIT: Findings: Gait Characteristics: With cane pt does not use in classic 2 or 3 point pattern but uses sporadically, decreased step length- Right, and decreased step length- Left, Distance walked: 30 ft, Assistive device utilized:Single point cane, Level of assistance: CGA, and Comments: Describing gait with SPC in R UE   FUNCTIONAL TESTS:  5 times sit to stand: 15.55 sec, imbalance on a few Timed up and go (TUG): 22.62 sec with cane  6 minute walk test: Test visit 2  10 meter walk test: 22.67 sec with cane, 13.6 sec with rollator   Berg Balance Scale: Test visit 2    PATIENT SURVEYS:  LEFS  Extreme difficulty/unable (0), Quite a bit of difficulty (1), Moderate difficulty (2), Little difficulty (3), No difficulty (4) Survey date:  WIFE FILLS OUT 05/12/24  Any of your usual work, housework or school activities 3  2. Usual hobbies, recreational or sporting activities 2  3. Getting into/out of the bath 4  4. Walking between rooms 3  5. Putting on socks/shoes 2  6. Squatting  1  7. Lifting an object, like a bag of groceries from the floor 3  8. Performing light activities around your home 3  9. Performing heavy activities around your home 2  10. Getting into/out of a car 1  11. Walking 2 blocks 2  12. Walking 1 mile 1  13. Going up/down 10 stairs (1 flight) 1  14. Standing for 1 hour 2  15.  sitting for 1 hour 4  16. Running on even ground 1  17. Running on uneven ground 1  18. Making  sharp turns while running fast 1  19. Hopping  0  20. Rolling over in bed 2  Score total:  40                                                                                                                                 TREATMENT DATE: 08/11/24   NMR: To facilitate reeducation of movement, balance, posture, coordination, and/or proprioception/kinesthetic sense.  Stance on airex with head turns laterally x 15 head turns ea way   TA- To improve functional movements patterns for everyday tasks   STS 2 x 12 reps no UE assist, cue for forward weight shift   Forward and reverse gait without UE  support or device but close CGA 2 x 15 ft ea  Sidestepping 5 x 8 ft no UE  High knee march 2 x 15  Side step ups x 10 ea side with upper extremity support  TE- To improve strength, endurance, mobility, and function of specific targeted muscle groups or improve joint range of motion or improve muscle flexibility  Seated Laq with 5# AW 2 x 10   PATIENT EDUCATION: Education details: Pt educated throughout session about proper posture and technique with exercises. Improved exercise technique, movement at target joints, use of target muscles after min to mod verbal, visual, tactile cues. Person educated: Patient Education method: Explanation Education comprehension: verbalized understanding   HOME EXERCISE PROGRAM: Verbally added mini squat at bar to replace STS. No new handout provided, has handout from previous PT  Verbally added HS flexion with BTB to HEP. No new handout provided, has handout from previous PT    GOALS: Goals reviewed with patient? Yes  SHORT TERM GOALS: Target date: 06/09/2024   Patient will be independent in home exercise program to improve strength/mobility for better functional independence with ADLs. Baseline: No HEP currently 8/11: Diligently completing HEP  Goal status: INITIAL  LONG TERM GOALS: Target date: 08/04/2024  1.  Patient will complete five times sit  to stand test in < 15 seconds without LOB indicating an increased LE strength and improved balance. Baseline: 15.55 sec, imbalance on a few reps requiring CGA for safety 7/7:14.11 sec 8/11:15.62 sec no UE 8/20:16.86 sec no UE support 8/27:13.95 sec no UE support Goal status: MET  2.  Patient will improve LEFS score to 50   to demonstrate statistically significant improvement in mobility and quality of life as it relates to their LE strength and mobility.  Baseline: 40 7/7:42 8/27:41 Goal status: ONGOING   3.  Patient will increase Berg Balance score by > 6 points to demonstrate decreased fall risk during functional activities. Baseline: 38 8/11:43 8/27: 46 Goal status: MET   4.   Patient will reduce timed up and go to <11 seconds to reduce fall risk and demonstrate improved transfer/gait ability. Baseline: 22.62 sec with SPC  8/11:15.39 sec with 4WW 8/27: 15.1 sec with RW 14.7 sec with no AD  Goal status: ONGOING  5.   Patient will increase 10 meter walk test to >1.65m/s as to improve gait speed for better community ambulation and to reduce fall risk. Baseline: .44 m/s w/ SPC, .73 m/s with rollator .75 m/s with rollator  Goal status: ONGOING  6.   Patient will increase six minute walk test distance by 150 ft or greater for progression to community ambulator and improve gait ability Baseline: progress note 7/7:750 ft, increased shuffling style gait from minute 2 and on as well as increased trunk flexion from this point on as well. 8/11: 750 ft with 4WW, improved gait quality ( no shuffling until last minute) and improved trunk flexion posture. Similar speed but less fatigue 8/27: 917 ft with 4WW; flexed posture dominates gait and causing shuffling last 1.5 min  Goal status: MET    ASSESSMENT:  CLINICAL IMPRESSION:     Patient arrived with good motivation for completion of pt activities.  Pt continues with progression from interventions completed in previous sessions.  Patient overall  continuing to make good progress at this time but still continues to require assistance and cues for certain tasks.  Patient provided with some instruction in proper use for ankle weights and easier ways to don and  doff them at home this date.Pt will continue to benefit from skilled physical therapy intervention to address impairments, improve QOL, and attain therapy goals.      OBJECTIVE IMPAIRMENTS: Abnormal gait, decreased activity tolerance, decreased balance, decreased endurance, decreased knowledge of condition, decreased knowledge of use of DME, and decreased strength.   ACTIVITY LIMITATIONS: standing, squatting, stairs, transfers, and locomotion level  PARTICIPATION LIMITATIONS: meal prep, shopping, and community activity  PERSONAL FACTORS: Age, Behavior pattern, and 3+ comorbidities: HTN, Chronic Diastolic Heart Failure, post polio,  are also affecting patient's functional outcome.   REHAB POTENTIAL: Good  CLINICAL DECISION MAKING: Evolving/moderate complexity  EVALUATION COMPLEXITY: Moderate  PLAN:  PT FREQUENCY: 2x/week  PT DURATION: 12 weeks  PLANNED INTERVENTIONS: 97750- Physical Performance Testing, 97110-Therapeutic exercises, 97530- Therapeutic activity, [id number]- Neuromuscular re-education, 97535- Self Care, [id number]- Manual therapy, and [id number]- Gait training  PLAN FOR NEXT SESSION:   Progress Hip flexor intervention, endurance and extensors. Initiate static balance interventions reflective of BERG deficits  Training keeping COM over BOS   Note: Portions of this document were prepared using Dragon voice recognition software and although reviewed may contain unintentional dictation errors in syntax, grammar, or spelling.  Lonni KATHEE Gainer PT ,DPT Physical Therapist- [facility]   11:36 AM 08/11/24

## 2024-08-13 ENCOUNTER — Ambulatory Visit: Admitting: Physical Therapy

## 2024-08-13 DIAGNOSIS — R262 Difficulty in walking, not elsewhere classified: Secondary | ICD-10-CM

## 2024-08-13 DIAGNOSIS — R2681 Unsteadiness on feet: Secondary | ICD-10-CM

## 2024-08-13 DIAGNOSIS — M6281 Muscle weakness (generalized): Secondary | ICD-10-CM

## 2024-08-13 DIAGNOSIS — R2689 Other abnormalities of gait and mobility: Secondary | ICD-10-CM

## 2024-08-13 NOTE — Therapy (Signed)
 OUTPATIENT PHYSICAL THERAPY NEURO TREATMENT     Patient Name: Eugene Martinez. MRN: [id number] DOB:01/21/1946, 78 y.o., male Today's Date: 08/13/2024   PCP: Sadie Manna, MD  REFERRING PROVIDER: Abe Erlinda DASEN, PA-C   END OF SESSION:  PT End of Session - 08/13/24 1451     Visit Number 27    Number of Visits 41    Date for PT Re-Evaluation 09/30/24    Progress Note Due on Visit 30    PT Start Time 1448    PT Stop Time 1526    PT Time Calculation (min) 38 min    Equipment Utilized During Treatment Gait belt    Activity Tolerance Patient tolerated treatment well    Behavior During Therapy WFL for tasks assessed/performed                       Past Medical History:  Diagnosis Date   Allergic state    Amyloidosis (HCC)    Anemia    BPH (benign prostatic hyperplasia)    DDD (degenerative disc disease), lumbar    Dysplastic nevus 10/13/2013   Left posterior waistline paraspinal. Moderate to severe atypia, pattern borders on early evolving MIS, edge involved. Excised 03/31/2014, margins free.   Dysplastic nevus 12/19/2017   Right upper back medial. Mild atypia, lateral and deep margins involved.    Dysplastic nevus 11/28/2022   right prox lat thigh, Severe atypia, Excised 01/08/23   Dysrhythmia    Environmental and seasonal allergies    Gallop rhythm    GERD (gastroesophageal reflux disease)    Hypertension    Mild mitral regurgitation    a. by echo 10/2012   Pain    bil. 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 10/2012 Echo: EF 55-60%, severe LVH with speckling of myocardium -? amyloid. Mild MR, mod dil LA.   Past Surgical History:  Procedure Laterality Date   CIRCUMCISION  1984   COLONOSCOPY  03/2012   COLONOSCOPY WITH PROPOFOL  N/A 10/29/2017   Procedure: COLONOSCOPY WITH PROPOFOL ;  Surgeon: Gaylyn Gladis PENNER, MD;  Location: [facility] ENDOSCOPY;  Service: Endoscopy;  Laterality: N/A;   FOOT SURGERY     right foot   FOOT  SURGERY Right 2017   LEG SURGERIES     HX.OF MULTIPLE SURGERIES FOR LEG LENGTH DUE TO POLIO   LUMBAR LAMINECTOMY/DECOMPRESSION MICRODISCECTOMY N/A 01/26/2019   Procedure: LUMBAR LAMINECTOMY/DECOMPRESSION MICRODISCECTOMY 1 LEVEL L3-4;  Surgeon: Bluford Standing, MD;  Location: [facility] ORS;  Service: Neurosurgery;  Laterality: N/A;   NECK SURGERY     PILONIDAL CYST / SINUS EXCISION     Patient Active Problem List   Diagnosis Date Noted   Ambulatory dysfunction 10/22/2023   Prediabetes 02/05/2023   Anemia 08/01/2020   Elevated hemoglobin A1c 08/01/2020   Loss of memory 08/01/2020   Sesamoiditis 06/25/2019   S/P lumbar laminectomy 01/26/2019   Weakness of both lower limbs 10/24/2018   LVH (left ventricular hypertrophy) 08/29/2016   Allergy 02/20/2016   Amyloidosis (HCC) 02/20/2016   Thrombocytopenia (HCC) 11/23/2015   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   Airway hyperreactivity 05/07/2013   Benign fibroma of prostate 05/07/2013   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   Chronic diastolic heart failure (HCC) 11/18/2012   Proteinuria, Bence Jones 11/18/2012   Multiple allergies 10/29/2012   Hypertension 10/29/2012   RBBB (right bundle branch block with left anterior fascicular block) 10/29/2012   Proteinuria 10/29/2012  ONSET DATE: 04/22/24  REFERRING DIAG: R26.9 (ICD-10-CM) - Altered gait   THERAPY DIAG:  Difficulty in walking, not elsewhere classified  Unsteadiness on feet  Other abnormalities of gait and mobility  Muscle weakness (generalized)  Rationale for Evaluation and Treatment: Rehabilitation  SUBJECTIVE:                                                                                                                                                                                             SUBJECTIVE STATEMENT:   Pt reports doing well today. Pt denies any recent falls/stumbles since prior session. Pt denies any updates to medications or medical appointment since prior  session. Pt reports good compliance with HEP when time permits.  Patient's wife does report that he had to get down to the floor to put away some pots and pans had a lot of difficulty getting up but he was adamant about doing it himself and was able to complete it independently although she is not sure how he was able to get up because she was not in the room at the time.  From Eval:Pt wife report he has been inconsistent with exercise since discharge. She also assist with all of subjective. Pt has been wall and furniture surfing to get around home. Pt had one pseudo fall in last 6 months; he went on a 4's into the recycling can but could not get up. Pt has not been following trough with his exercise. Pt has been using the cane almost all of the time outside of the house in comparison to rollator.  Patient and caregiver want to work on general strength and mobility. Pt caregiver has some concerns with getting rollator into and out of the car.  As well as getting patient into and out of the car.  Pt accompanied by: significant other  PERTINENT HISTORY: . 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 No  PRECAUTIONS: Fall  RED FLAGS: None   WEIGHT BEARING RESTRICTIONS: No  FALLS: Has patient fallen in last 6 months? No fall but went to ground intentionally and could not get up   LIVING ENVIRONMENT: LIVING ENVIRONMENT: Lives with: lives with their spouse Lives in: House/apartment Stairs: 1 step into home Has following equipment at home: Retail banker - 2 wheeled *On Arts development officer for [place] at [employer]  PLOF: Independent with household mobility with device and Independent with community mobility with device  PATIENT GOALS: Improve balance and mobility   OBJECTIVE:  Note: Objective  measures were completed at Evaluation unless otherwise  noted.   COGNITION: Overall cognitive status: Impaired and Mild cogintive impairment     POSTURE: rounded shoulders, forward head, and increased thoracic kyphosis  LOWER EXTREMITY ROM:   WNL for tasks assessed    LOWER EXTREMITY MMT:    MMT Right Eval Left Eval  Hip flexion 4 4  Hip extension    Hip abduction 4+ 4+  Hip adduction 4+ 4+  Hip internal rotation    Hip external rotation    Knee flexion 4- 4-  Knee extension 4 4  Ankle dorsiflexion -   Ankle plantarflexion -   Ankle inversion    Ankle eversion    (Blank rows = not tested)  BED MOBILITY:  Not tested  TRANSFERS: Sit to stand: Complete Independence and on a few reps noted instability in standing  Assistive device utilized: None     Stand to sit: Complete Independence  Assistive device utilized: None      GAIT: Findings: Gait Characteristics: With cane pt does not use in classic 2 or 3 point pattern but uses sporadically, decreased step length- Right, and decreased step length- Left, Distance walked: 30 ft, Assistive device utilized:Single point cane, Level of assistance: CGA, and Comments: Describing gait with SPC in R UE   FUNCTIONAL TESTS:  5 times sit to stand: 15.55 sec, imbalance on a few Timed up and go (TUG): 22.62 sec with cane  6 minute walk test: Test visit 2  10 meter walk test: 22.67 sec with cane, 13.6 sec with rollator   Berg Balance Scale: Test visit 2    PATIENT SURVEYS:  LEFS  Extreme difficulty/unable (0), Quite a bit of difficulty (1), Moderate difficulty (2), Little difficulty (3), No difficulty (4) Survey date:  WIFE FILLS OUT 05/12/24  Any of your usual work, housework or school activities 3  2. Usual hobbies, recreational or sporting activities 2  3. Getting into/out of the bath 4  4. Walking between rooms 3  5. Putting on socks/shoes 2  6. Squatting  1  7. Lifting an object, like a bag of groceries from the floor 3  8. Performing light activities around your home 3  9.  Performing heavy activities around your home 2  10. Getting into/out of a car 1  11. Walking 2 blocks 2  12. Walking 1 mile 1  13. Going up/down 10 stairs (1 flight) 1  14. Standing for 1 hour 2  15.  sitting for 1 hour 4  16. Running on even ground 1  17. Running on uneven ground 1  18. Making sharp turns while running fast 1  19. Hopping  0  20. Rolling over in bed 2  Score total:  40                                                                                                                                 TREATMENT  DATE: 08/13/24   NMR: To facilitate reeducation of movement, balance, posture, coordination, and/or proprioception/kinesthetic sense.  Stance on airex with head turns laterally x 15 head turns ea way x 4 min additionally without head turns. Focus on maintaining balance and recovery when getting off balance, poor ability to complete this overall.   TA- To improve functional movements patterns for everyday tasks   STS 2 x 12 reps no UE assist, cue for forward weight shift   Sidestepping 5 x 8 ft with 5# AW and UE assist   High knee march 2 x 12 with UE and 5# AW     GAIT TRAINING  Gait with rollator and 5# AW x 180 ft with cues for step length and foot clearance   Forward and reverse gait without UE support or device but close CGA 3 x 12 ft ea, on last rep pt very unstable transitioning to rollator   Gait without UE support with turns x 50 ft   TE- To improve strength, endurance, mobility, and function of specific targeted muscle groups or improve joint range of motion or improve muscle flexibility  Seated Laq with 5# AW 2 x 10   Unless otherwise stated, CGA was provided and gait belt donned in order to ensure pt safety   PATIENT EDUCATION: Education details: Pt educated throughout session about proper posture and technique with exercises. Improved exercise technique, movement at target joints, use of target muscles after min to mod verbal, visual,  tactile cues. Person educated: Patient Education method: Explanation Education comprehension: verbalized understanding   HOME EXERCISE PROGRAM: Verbally added mini squat at bar to replace STS. No new handout provided, has handout from previous PT  Verbally added HS flexion with BTB to HEP. No new handout provided, has handout from previous PT    GOALS: Goals reviewed with patient? Yes  SHORT TERM GOALS: Target date: 06/09/2024   Patient will be independent in home exercise program to improve strength/mobility for better functional independence with ADLs. Baseline: No HEP currently 8/11: Diligently completing HEP  Goal status: INITIAL  LONG TERM GOALS: Target date: 08/04/2024  1.  Patient will complete five times sit to stand test in < 15 seconds without LOB indicating an increased LE strength and improved balance. Baseline: 15.55 sec, imbalance on a few reps requiring CGA for safety 7/7:14.11 sec 8/11:15.62 sec no UE 8/20:16.86 sec no UE support 8/27:13.95 sec no UE support Goal status: MET  2.  Patient will improve LEFS score to 50   to demonstrate statistically significant improvement in mobility and quality of life as it relates to their LE strength and mobility.  Baseline: 40 7/7:42 8/27:41 Goal status: ONGOING   3.  Patient will increase Berg Balance score by > 6 points to demonstrate decreased fall risk during functional activities. Baseline: 38 8/11:43 8/27: 46 Goal status: MET   4.   Patient will reduce timed up and go to <11 seconds to reduce fall risk and demonstrate improved transfer/gait ability. Baseline: 22.62 sec with SPC  8/11:15.39 sec with 4WW 8/27: 15.1 sec with RW 14.7 sec with no AD  Goal status: ONGOING  5.   Patient will increase 10 meter walk test to >1.51m/s as to improve gait speed for better community ambulation and to reduce fall risk. Baseline: .44 m/s w/ SPC, .73 m/s with rollator .75 m/s with rollator  Goal status: ONGOING  6.   Patient will  increase six minute walk test distance by 150 ft or greater for  progression to community ambulator and improve gait ability Baseline: progress note 7/7:750 ft, increased shuffling style gait from minute 2 and on as well as increased trunk flexion from this point on as well. 8/11: 750 ft with 4WW, improved gait quality ( no shuffling until last minute) and improved trunk flexion posture. Similar speed but less fatigue 8/27: 917 ft with 4WW; flexed posture dominates gait and causing shuffling last 1.5 min  Goal status: MET    ASSESSMENT:  CLINICAL IMPRESSION:    Patient arrived with good motivation for completion of pt activities.  Pt continues with progression from interventions completed in previous sessions.  Patient overall continuing to make good progress at this time but still continues to require assistance and cues for certain tasks.  Patient still having a lot of difficulty with recovery of balance when center of mass gets outside of base of support. Pt will continue to benefit from skilled physical therapy intervention to address impairments, improve QOL, and attain therapy goals.    OBJECTIVE IMPAIRMENTS: Abnormal gait, decreased activity tolerance, decreased balance, decreased endurance, decreased knowledge of condition, decreased knowledge of use of DME, and decreased strength.   ACTIVITY LIMITATIONS: standing, squatting, stairs, transfers, and locomotion level  PARTICIPATION LIMITATIONS: meal prep, shopping, and community activity  PERSONAL FACTORS: Age, Behavior pattern, and 3+ comorbidities: HTN, Chronic Diastolic Heart Failure, post polio,  are also affecting patient's functional outcome.   REHAB POTENTIAL: Good  CLINICAL DECISION MAKING: Evolving/moderate complexity  EVALUATION COMPLEXITY: Moderate  PLAN:  PT FREQUENCY: 2x/week  PT DURATION: 12 weeks  PLANNED INTERVENTIONS: 97750- Physical Performance Testing, 97110-Therapeutic exercises, 97530- Therapeutic activity,  [id number]- Neuromuscular re-education, 97535- Self Care, [id number]- Manual therapy, and [id number]- Gait training  PLAN FOR NEXT SESSION:   Progress Hip flexor intervention, endurance and extensors. Initiate static balance interventions reflective of BERG deficits  Training keeping COM over BOS   Note: Portions of this document were prepared using Dragon voice recognition software and although reviewed may contain unintentional dictation errors in syntax, grammar, or spelling.  Lonni KATHEE Gainer PT ,DPT Physical Therapist-   [facility]   3:54 PM 08/13/24

## 2024-08-17 ENCOUNTER — Ambulatory Visit: Admitting: Physical Therapy

## 2024-08-17 DIAGNOSIS — M6281 Muscle weakness (generalized): Secondary | ICD-10-CM

## 2024-08-17 DIAGNOSIS — R262 Difficulty in walking, not elsewhere classified: Secondary | ICD-10-CM

## 2024-08-17 DIAGNOSIS — R2681 Unsteadiness on feet: Secondary | ICD-10-CM

## 2024-08-17 DIAGNOSIS — R2689 Other abnormalities of gait and mobility: Secondary | ICD-10-CM

## 2024-08-17 NOTE — Therapy (Signed)
 OUTPATIENT PHYSICAL THERAPY NEURO TREATMENT     Patient Name: Eugene Martinez. MRN: [id number] DOB:September 30, 1946, 78 y.o., male Today's Date: 08/17/2024   PCP: Sadie Manna, MD  REFERRING PROVIDER: Caffaro, Kaitlin T, PA-C   END OF SESSION:  PT End of Session - 08/17/24 0804     Visit Number 28    Number of Visits 41    Date for PT Re-Evaluation 09/30/24    Progress Note Due on Visit 30    PT Start Time 0802    PT Stop Time 0842    PT Time Calculation (min) 40 min    Equipment Utilized During Treatment Gait belt    Activity Tolerance Patient tolerated treatment well    Behavior During Therapy WFL for tasks assessed/performed                       Past Medical History:  Diagnosis Date   Allergic state    Amyloidosis (HCC)    Anemia    BPH (benign prostatic hyperplasia)    DDD (degenerative disc disease), lumbar    Dysplastic nevus 10/13/2013   Left posterior waistline paraspinal. Moderate to severe atypia, pattern borders on early evolving MIS, edge involved. Excised 03/31/2014, margins free.   Dysplastic nevus 12/19/2017   Right upper back medial. Mild atypia, lateral and deep margins involved.    Dysplastic nevus 11/28/2022   right prox lat thigh, Severe atypia, Excised 01/08/23   Dysrhythmia    Environmental and seasonal allergies    Gallop rhythm    GERD (gastroesophageal reflux disease)    Hypertension    Mild mitral regurgitation    a. by echo 10/2012   Pain    bil. 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 10/2012 Echo: EF 55-60%, severe LVH with speckling of myocardium -? amyloid. Mild MR, mod dil LA.   Past Surgical History:  Procedure Laterality Date   CIRCUMCISION  1984   COLONOSCOPY  03/2012   COLONOSCOPY WITH PROPOFOL  N/A 10/29/2017   Procedure: COLONOSCOPY WITH PROPOFOL ;  Surgeon: Gaylyn Gladis PENNER, MD;  Location: [facility] ENDOSCOPY;  Service: Endoscopy;  Laterality: N/A;   FOOT SURGERY     right foot   FOOT  SURGERY Right 2017   LEG SURGERIES     HX.OF MULTIPLE SURGERIES FOR LEG LENGTH DUE TO POLIO   LUMBAR LAMINECTOMY/DECOMPRESSION MICRODISCECTOMY N/A 01/26/2019   Procedure: LUMBAR LAMINECTOMY/DECOMPRESSION MICRODISCECTOMY 1 LEVEL L3-4;  Surgeon: Bluford Standing, MD;  Location: [facility] ORS;  Service: Neurosurgery;  Laterality: N/A;   NECK SURGERY     PILONIDAL CYST / SINUS EXCISION     Patient Active Problem List   Diagnosis Date Noted   Ambulatory dysfunction 10/22/2023   Prediabetes 02/05/2023   Anemia 08/01/2020   Elevated hemoglobin A1c 08/01/2020   Loss of memory 08/01/2020   Sesamoiditis 06/25/2019   S/P lumbar laminectomy 01/26/2019   Weakness of both lower limbs 10/24/2018   LVH (left ventricular hypertrophy) 08/29/2016   Allergy 02/20/2016   Amyloidosis (HCC) 02/20/2016   Thrombocytopenia (HCC) 11/23/2015   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   Airway hyperreactivity 05/07/2013   Benign fibroma of prostate 05/07/2013   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   Chronic diastolic heart failure (HCC) 11/18/2012   Proteinuria, Bence Jones 11/18/2012   Multiple allergies 10/29/2012   Hypertension 10/29/2012   RBBB (right bundle branch block with left anterior fascicular block) 10/29/2012   Proteinuria 10/29/2012  ONSET DATE: 04/22/24  REFERRING DIAG: R26.9 (ICD-10-CM) - Altered gait   THERAPY DIAG:  Difficulty in walking, not elsewhere classified  Unsteadiness on feet  Other abnormalities of gait and mobility  Muscle weakness (generalized)  Rationale for Evaluation and Treatment: Rehabilitation  SUBJECTIVE:                                                                                                                                                                                             SUBJECTIVE STATEMENT:   Pt reports doing well today. Pt denies any recent falls/stumbles since prior session. Pt denies any updates to medications or medical appointment since prior  session. Pt reports good compliance with HEP when time permits.  Patient's wife does report that he had to get down to the floor to put away some pots and pans had a lot of difficulty getting up but he was adamant about doing it himself and was able to complete it independently although she is not sure how he was able to get up because she was not in the room at the time.  From Eval:Pt wife report he has been inconsistent with exercise since discharge. She also assist with all of subjective. Pt has been wall and furniture surfing to get around home. Pt had one pseudo fall in last 6 months; he went on a 4's into the recycling can but could not get up. Pt has not been following trough with his exercise. Pt has been using the cane almost all of the time outside of the house in comparison to rollator.  Patient and caregiver want to work on general strength and mobility. Pt caregiver has some concerns with getting rollator into and out of the car.  As well as getting patient into and out of the car.  Pt accompanied by: significant other  PERTINENT HISTORY: . 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 No  PRECAUTIONS: Fall  RED FLAGS: None   WEIGHT BEARING RESTRICTIONS: No  FALLS: Has patient fallen in last 6 months? No fall but went to ground intentionally and could not get up   LIVING ENVIRONMENT: LIVING ENVIRONMENT: Lives with: lives with their spouse Lives in: House/apartment Stairs: 1 step into home Has following equipment at home: Retail banker - 2 wheeled *On Arts development officer for [employer] at [employer]  PLOF: Independent with household mobility with device and Independent with community mobility with device  PATIENT GOALS: Improve balance and mobility   OBJECTIVE:  Note: Objective  measures were completed at Evaluation unless otherwise  noted.   COGNITION: Overall cognitive status: Impaired and Mild cogintive impairment     POSTURE: rounded shoulders, forward head, and increased thoracic kyphosis  LOWER EXTREMITY ROM:   WNL for tasks assessed    LOWER EXTREMITY MMT:    MMT Right Eval Left Eval  Hip flexion 4 4  Hip extension    Hip abduction 4+ 4+  Hip adduction 4+ 4+  Hip internal rotation    Hip external rotation    Knee flexion 4- 4-  Knee extension 4 4  Ankle dorsiflexion -   Ankle plantarflexion -   Ankle inversion    Ankle eversion    (Blank rows = not tested)  BED MOBILITY:  Not tested  TRANSFERS: Sit to stand: Complete Independence and on a few reps noted instability in standing  Assistive device utilized: None     Stand to sit: Complete Independence  Assistive device utilized: None      GAIT: Findings: Gait Characteristics: With cane pt does not use in classic 2 or 3 point pattern but uses sporadically, decreased step length- Right, and decreased step length- Left, Distance walked: 30 ft, Assistive device utilized:Single point cane, Level of assistance: CGA, and Comments: Describing gait with SPC in R UE   FUNCTIONAL TESTS:  5 times sit to stand: 15.55 sec, imbalance on a few Timed up and go (TUG): 22.62 sec with cane  6 minute walk test: Test visit 2  10 meter walk test: 22.67 sec with cane, 13.6 sec with rollator   Berg Balance Scale: Test visit 2    PATIENT SURVEYS:  LEFS  Extreme difficulty/unable (0), Quite a bit of difficulty (1), Moderate difficulty (2), Little difficulty (3), No difficulty (4) Survey date:  WIFE FILLS OUT 05/12/24  Any of your usual work, housework or school activities 3  2. Usual hobbies, recreational or sporting activities 2  3. Getting into/out of the bath 4  4. Walking between rooms 3  5. Putting on socks/shoes 2  6. Squatting  1  7. Lifting an object, like a bag of groceries from the floor 3  8. Performing light activities around your home 3  9.  Performing heavy activities around your home 2  10. Getting into/out of a car 1  11. Walking 2 blocks 2  12. Walking 1 mile 1  13. Going up/down 10 stairs (1 flight) 1  14. Standing for 1 hour 2  15.  sitting for 1 hour 4  16. Running on even ground 1  17. Running on uneven ground 1  18. Making sharp turns while running fast 1  19. Hopping  0  20. Rolling over in bed 2  Score total:  40                                                                                                                                 TREATMENT  DATE: 08/17/24   NMR: To facilitate reeducation of movement, balance, posture, coordination, and/or proprioception/kinesthetic sense.  Airex stance with ant reach x 8 min playing word game with ant reaching and forward gaze throughout, cues for not using UE support throughout   TA- To improve functional movements patterns for everyday tasks   STS 2 x 15 reps no UE assist, cue for forward weight shift   Sidestepping 2 x 6 x 8 ft with 5# AW and UE assist   High knee march 2 x 12 with UE and 5# AW   Standing hip abd 2 x 10 ea side with 5# AW   GAIT TRAINING  Gait with rollator and 5# AW x 320 ft with cues for step length  TE- To improve strength, endurance, mobility, and function of specific targeted muscle groups or improve joint range of motion or improve muscle flexibility  Seated Laq with 5# AW 2 x 10   Unless otherwise stated, CGA was provided and gait belt donned in order to ensure pt safety   PATIENT EDUCATION: Education details: Pt educated throughout session about proper posture and technique with exercises. Improved exercise technique, movement at target joints, use of target muscles after min to mod verbal, visual, tactile cues. Person educated: Patient Education method: Explanation Education comprehension: verbalized understanding   HOME EXERCISE PROGRAM: Verbally added mini squat at bar to replace STS. No new handout provided, has handout  from previous PT  Verbally added HS flexion with BTB to HEP. No new handout provided, has handout from previous PT    GOALS: Goals reviewed with patient? Yes  SHORT TERM GOALS: Target date: 06/09/2024   Patient will be independent in home exercise program to improve strength/mobility for better functional independence with ADLs. Baseline: No HEP currently 8/11: Diligently completing HEP  Goal status: INITIAL  LONG TERM GOALS: Target date: 08/04/2024  1.  Patient will complete five times sit to stand test in < 15 seconds without LOB indicating an increased LE strength and improved balance. Baseline: 15.55 sec, imbalance on a few reps requiring CGA for safety 7/7:14.11 sec 8/11:15.62 sec no UE 8/20:16.86 sec no UE support 8/27:13.95 sec no UE support Goal status: MET  2.  Patient will improve LEFS score to 50   to demonstrate statistically significant improvement in mobility and quality of life as it relates to their LE strength and mobility.  Baseline: 40 7/7:42 8/27:41 Goal status: ONGOING   3.  Patient will increase Berg Balance score by > 6 points to demonstrate decreased fall risk during functional activities. Baseline: 38 8/11:43 8/27: 46 Goal status: MET   4.   Patient will reduce timed up and go to <11 seconds to reduce fall risk and demonstrate improved transfer/gait ability. Baseline: 22.62 sec with SPC  8/11:15.39 sec with 4WW 8/27: 15.1 sec with RW 14.7 sec with no AD  Goal status: ONGOING  5.   Patient will increase 10 meter walk test to >1.26m/s as to improve gait speed for better community ambulation and to reduce fall risk. Baseline: .44 m/s w/ SPC, .73 m/s with rollator .75 m/s with rollator  Goal status: ONGOING  6.   Patient will increase six minute walk test distance by 150 ft or greater for progression to community ambulator and improve gait ability Baseline: progress note 7/7:750 ft, increased shuffling style gait from minute 2 and on as well as increased trunk  flexion from this point on as well. 8/11: 750 ft with 4WW, improved gait  quality ( no shuffling until last minute) and improved trunk flexion posture. Similar speed but less fatigue 8/27: 917 ft with 4WW; flexed posture dominates gait and causing shuffling last 1.5 min  Goal status: MET    ASSESSMENT:  CLINICAL IMPRESSION:    Patient arrived with good motivation for completion of pt activities.  Pt continues with progression from interventions completed in previous sessions.  Patient overall continuing to make good progress at this time but still continues to require assistance and cues for certain tasks.  Patient still having a lot of difficulty with recovery of balance when center of mass gets outside of base of support; focussed on ant weight shift with balance game today, difficulty with recovery from ant LOB requiring UE support to correct this in contrast with preferred hip or ankle strategy.  Pt will continue to benefit from skilled physical therapy intervention to address impairments, improve QOL, and attain therapy goals.    OBJECTIVE IMPAIRMENTS: Abnormal gait, decreased activity tolerance, decreased balance, decreased endurance, decreased knowledge of condition, decreased knowledge of use of DME, and decreased strength.   ACTIVITY LIMITATIONS: standing, squatting, stairs, transfers, and locomotion level  PARTICIPATION LIMITATIONS: meal prep, shopping, and community activity  PERSONAL FACTORS: Age, Behavior pattern, and 3+ comorbidities: HTN, Chronic Diastolic Heart Failure, post polio,  are also affecting patient's functional outcome.   REHAB POTENTIAL: Good  CLINICAL DECISION MAKING: Evolving/moderate complexity  EVALUATION COMPLEXITY: Moderate  PLAN:  PT FREQUENCY: 2x/week  PT DURATION: 12 weeks  PLANNED INTERVENTIONS: 97750- Physical Performance Testing, 97110-Therapeutic exercises, 97530- Therapeutic activity, [id number]- Neuromuscular re-education, 97535- Self Care, [id number]-  Manual therapy, and [id number]- Gait training  PLAN FOR NEXT SESSION:   Progress Hip flexor intervention, endurance and extensors. Initiate static balance interventions reflective of BERG deficits  Training keeping COM over BOS   Note: Portions of this document were prepared using Dragon voice recognition software and although reviewed may contain unintentional dictation errors in syntax, grammar, or spelling.  Lonni KATHEE Gainer PT ,DPT Physical Therapist- [facility]   8:25 AM 08/17/24

## 2024-08-18 ENCOUNTER — Ambulatory Visit: Admitting: Physical Therapy

## 2024-08-19 ENCOUNTER — Ambulatory Visit: Admitting: Podiatry

## 2024-08-19 DIAGNOSIS — M79676 Pain in unspecified toe(s): Secondary | ICD-10-CM

## 2024-08-19 DIAGNOSIS — B351 Tinea unguium: Secondary | ICD-10-CM | POA: Diagnosis not present

## 2024-08-19 NOTE — Progress Notes (Signed)
 He presents today for chief complaint of painful toenails and calluses bilaterally.  Objective: Pulses are palpable.  No calluses visualized.  Dropfoot right side toenails are long thick yellow dystrophic with mycotic.  Benign skin lesion none ulcer to the plantar aspect of the foot bilaterally  Assessment: Pain and secondary to onychomycosis.  Plan: Debridement of toenails 1 through 5 bilateral.  Also debrided benign skin lesions.

## 2024-08-20 ENCOUNTER — Ambulatory Visit: Admitting: Physical Therapy

## 2024-08-20 DIAGNOSIS — R2689 Other abnormalities of gait and mobility: Secondary | ICD-10-CM

## 2024-08-20 DIAGNOSIS — R262 Difficulty in walking, not elsewhere classified: Secondary | ICD-10-CM | POA: Diagnosis not present

## 2024-08-20 DIAGNOSIS — M6281 Muscle weakness (generalized): Secondary | ICD-10-CM

## 2024-08-20 DIAGNOSIS — R2681 Unsteadiness on feet: Secondary | ICD-10-CM

## 2024-08-20 NOTE — Therapy (Signed)
 OUTPATIENT PHYSICAL THERAPY NEURO TREATMENT     Patient Name: Eugene Martinez. MRN: [id number] DOB:Sep 02, 1946, 78 y.o., male Today's Date: 08/20/2024   PCP: Sadie Manna, MD  REFERRING PROVIDER: Caffaro, Kaitlin T, PA-C   END OF SESSION:  PT End of Session - 08/20/24 1152     Visit Number 29    Number of Visits 41    Date for PT Re-Evaluation 09/30/24    Progress Note Due on Visit 30    PT Start Time 1148    PT Stop Time 1228    PT Time Calculation (min) 40 min    Equipment Utilized During Treatment Gait belt    Activity Tolerance Patient tolerated treatment well    Behavior During Therapy WFL for tasks assessed/performed                        Past Medical History:  Diagnosis Date   Allergic state    Amyloidosis (HCC)    Anemia    BPH (benign prostatic hyperplasia)    DDD (degenerative disc disease), lumbar    Dysplastic nevus 10/13/2013   Left posterior waistline paraspinal. Moderate to severe atypia, pattern borders on early evolving MIS, edge involved. Excised 03/31/2014, margins free.   Dysplastic nevus 12/19/2017   Right upper back medial. Mild atypia, lateral and deep margins involved.    Dysplastic nevus 11/28/2022   right prox lat thigh, Severe atypia, Excised 01/08/23   Dysrhythmia    Environmental and seasonal allergies    Gallop rhythm    GERD (gastroesophageal reflux disease)    Hypertension    Mild mitral regurgitation    a. by echo 10/2012   Pain    bil. 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 10/2012 Echo: EF 55-60%, severe LVH with speckling of myocardium -? amyloid. Mild MR, mod dil LA.   Past Surgical History:  Procedure Laterality Date   CIRCUMCISION  1984   COLONOSCOPY  03/2012   COLONOSCOPY WITH PROPOFOL  N/A 10/29/2017   Procedure: COLONOSCOPY WITH PROPOFOL ;  Surgeon: Gaylyn Gladis PENNER, MD;  Location: [facility] ENDOSCOPY;  Service: Endoscopy;  Laterality: N/A;   FOOT SURGERY     right foot    FOOT SURGERY Right 2017   LEG SURGERIES     HX.OF MULTIPLE SURGERIES FOR LEG LENGTH DUE TO POLIO   LUMBAR LAMINECTOMY/DECOMPRESSION MICRODISCECTOMY N/A 01/26/2019   Procedure: LUMBAR LAMINECTOMY/DECOMPRESSION MICRODISCECTOMY 1 LEVEL L3-4;  Surgeon: Bluford Standing, MD;  Location: [facility] ORS;  Service: Neurosurgery;  Laterality: N/A;   NECK SURGERY     PILONIDAL CYST / SINUS EXCISION     Patient Active Problem List   Diagnosis Date Noted   Ambulatory dysfunction 10/22/2023   Prediabetes 02/05/2023   Anemia 08/01/2020   Elevated hemoglobin A1c 08/01/2020   Loss of memory 08/01/2020   Sesamoiditis 06/25/2019   S/P lumbar laminectomy 01/26/2019   Weakness of both lower limbs 10/24/2018   LVH (left ventricular hypertrophy) 08/29/2016   Allergy 02/20/2016   Amyloidosis (HCC) 02/20/2016   Thrombocytopenia (HCC) 11/23/2015   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   Airway hyperreactivity 05/07/2013   Benign fibroma of prostate 05/07/2013   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   Chronic diastolic heart failure (HCC) 11/18/2012   Proteinuria, Bence Jones 11/18/2012   Multiple allergies 10/29/2012   Hypertension 10/29/2012   RBBB (right bundle branch block with left anterior fascicular block) 10/29/2012   Proteinuria 10/29/2012  ONSET DATE: 04/22/24  REFERRING DIAG: R26.9 (ICD-10-CM) - Altered gait   THERAPY DIAG:  Difficulty in walking, not elsewhere classified  Unsteadiness on feet  Other abnormalities of gait and mobility  Muscle weakness (generalized)  Rationale for Evaluation and Treatment: Rehabilitation  SUBJECTIVE:                                                                                                                                                                                             SUBJECTIVE STATEMENT:   Pt reports no falls. No updates since last session.   From Eval:Pt wife report he has been inconsistent with exercise since discharge. She also assist  with all of subjective. Pt has been wall and furniture surfing to get around home. Pt had one pseudo fall in last 6 months; he went on a 4's into the recycling can but could not get up. Pt has not been following trough with his exercise. Pt has been using the cane almost all of the time outside of the house in comparison to rollator.  Patient and caregiver want to work on general strength and mobility. Pt caregiver has some concerns with getting rollator into and out of the car.  As well as getting patient into and out of the car.  Pt accompanied by: significant other  PERTINENT HISTORY: . 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 No  PRECAUTIONS: Fall  RED FLAGS: None   WEIGHT BEARING RESTRICTIONS: No  FALLS: Has patient fallen in last 6 months? 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   COGNITION: Overall cognitive status: Impaired and Mild cogintive impairment     POSTURE: rounded shoulders, forward head, and increased thoracic kyphosis  LOWER EXTREMITY ROM:   WNL for tasks assessed    LOWER EXTREMITY MMT:    MMT Right Eval Left Eval  Hip flexion 4 4  Hip extension    Hip abduction 4+ 4+  Hip adduction 4+ 4+  Hip internal rotation    Hip external rotation  Knee flexion 4- 4-  Knee extension 4 4  Ankle dorsiflexion -    Ankle plantarflexion -   Ankle inversion    Ankle eversion    (Blank rows = not tested)  BED MOBILITY:  Not tested  TRANSFERS: Sit to stand: Complete Independence and on a few reps noted instability in standing  Assistive device utilized: None     Stand to sit: Complete Independence  Assistive device utilized: None      GAIT: Findings: Gait Characteristics: With cane pt does not use in classic 2 or 3 point pattern but uses sporadically, decreased step length- Right, and decreased step length- Left, Distance walked: 30 ft, Assistive device utilized:Single point cane, Level of assistance: CGA, and Comments: Describing gait with SPC in R UE   FUNCTIONAL TESTS:  5 times sit to stand: 15.55 sec, imbalance on a few Timed up and go (TUG): 22.62 sec with cane  6 minute walk test: Test visit 2  10 meter walk test: 22.67 sec with cane, 13.6 sec with rollator   Berg Balance Scale: Test visit 2    PATIENT SURVEYS:  LEFS  Extreme difficulty/unable (0), Quite a bit of difficulty (1), Moderate difficulty (2), Little difficulty (3), No difficulty (4) Survey date:  WIFE FILLS OUT 05/12/24  Any of your usual work, housework or school activities 3  2. Usual hobbies, recreational or sporting activities 2  3. Getting into/out of the bath 4  4. Walking between rooms 3  5. Putting on socks/shoes 2  6. Squatting  1  7. Lifting an object, like a bag of groceries from the floor 3  8. Performing light activities around your home 3  9. Performing heavy activities around your home 2  10. Getting into/out of a car 1  11. Walking 2 blocks 2  12. Walking 1 mile 1  13. Going up/down 10 stairs (1 flight) 1  14. Standing for 1 hour 2  15.  sitting for 1 hour 4  16. Running on even ground 1  17. Running on uneven ground 1  18. Making sharp turns while running fast 1  19. Hopping  0  20. Rolling over in bed 2  Score total:  40                                                                                                                                  TREATMENT DATE: 08/20/24     TA- To improve functional movements patterns for everyday tasks   STS 2 x 15 reps no UE assist, cue for forward weight shift   Sidestepping 2  x 20 ft with CGA  Forward and retro gait x 20 ft ea no AD but CGA   Practice and instruction in getting on and off the floor. Instructed pt and caregiver. Used table.   GAIT TRAINING  Gait with rollator and 5# AW x 470 ft with cues for step  length  TE- To improve strength, endurance, mobility, and function of specific targeted muscle groups or improve joint range of motion or improve muscle flexibility  Seated Laq with 5# AW 2 x 15   Unless otherwise stated, CGA was provided and gait belt donned in order to ensure pt safety   PATIENT EDUCATION: Education details: Pt educated throughout session about proper posture and technique with exercises. Improved exercise technique, movement at target joints, use of target muscles after min to mod verbal, visual, tactile cues. Person educated: Patient Education method: Explanation Education comprehension: verbalized understanding   HOME EXERCISE PROGRAM: Verbally added mini squat at bar to replace STS. No new handout provided, has handout from previous PT  Verbally added HS flexion with BTB to HEP. No new handout provided, has handout from previous PT    GOALS: Goals reviewed with patient? Yes  SHORT TERM GOALS: Target date: 06/09/2024   Patient will be independent in home exercise program to improve strength/mobility for better functional independence with ADLs. Baseline: No HEP currently 8/11: Diligently completing HEP  Goal status: INITIAL  LONG TERM GOALS: Target date: 08/04/2024  1.  Patient will complete five times sit to stand test in < 15 seconds without LOB indicating an increased LE strength and improved balance. Baseline: 15.55 sec, imbalance on a few reps requiring CGA for safety 7/7:14.11 sec  8/11:15.62 sec no UE 8/20:16.86 sec no UE support 8/27:13.95 sec no UE support Goal status: MET  2.  Patient will improve LEFS score to 50   to demonstrate statistically significant improvement in mobility and quality of life as it relates to their LE strength and mobility.  Baseline: 40 7/7:42 8/27:41 Goal status: ONGOING   3.  Patient will increase Berg Balance score by > 6 points to demonstrate decreased fall risk during functional activities. Baseline: 38 8/11:43 8/27: 46 Goal status: MET   4.   Patient will reduce timed up and go to <11 seconds to reduce fall risk and demonstrate improved transfer/gait ability. Baseline: 22.62 sec with SPC  8/11:15.39 sec with 4WW 8/27: 15.1 sec with RW 14.7 sec with no AD  Goal status: ONGOING  5.   Patient will increase 10 meter walk test to >1.48m/s as to improve gait speed for better community ambulation and to reduce fall risk. Baseline: .44 m/s w/ SPC, .73 m/s with rollator .75 m/s with rollator  Goal status: ONGOING  6.   Patient will increase six minute walk test distance by 150 ft or greater for progression to community ambulator and improve gait ability Baseline: progress note 7/7:750 ft, increased shuffling style gait from minute 2 and on as well as increased trunk flexion from this point on as well. 8/11: 750 ft with 4WW, improved gait quality ( no shuffling until last minute) and improved trunk flexion posture. Similar speed but less fatigue 8/27: 917 ft with 4WW; flexed posture dominates gait and causing shuffling last 1.5 min  Goal status: MET    ASSESSMENT:  CLINICAL IMPRESSION:    Patient arrived with good motivation for completion of pt activities.  Pt continues with progression from interventions completed in previous sessions.  Patient overall continuing to make good progress at this time but still continues to require assistance and cues for certain tasks.  Patient still having a lot of difficulty with recovery of balance when  center of mass gets outside of base of support; focussed on ant weight shift with balance game today, difficulty with recovery from ant LOB requiring  UE support to correct this in contrast with preferred hip or ankle strategy.  Pt will continue to benefit from skilled physical therapy intervention to address impairments, improve QOL, and attain therapy goals.    OBJECTIVE IMPAIRMENTS: Abnormal gait, decreased activity tolerance, decreased balance, decreased endurance, decreased knowledge of condition, decreased knowledge of use of DME, and decreased strength.   ACTIVITY LIMITATIONS: standing, squatting, stairs, transfers, and locomotion level  PARTICIPATION LIMITATIONS: meal prep, shopping, and community activity  PERSONAL FACTORS: Age, Behavior pattern, and 3+ comorbidities: HTN, Chronic Diastolic Heart Failure, post polio,  are also affecting patient's functional outcome.   REHAB POTENTIAL: Good  CLINICAL DECISION MAKING: Evolving/moderate complexity  EVALUATION COMPLEXITY: Moderate  PLAN:  PT FREQUENCY: 2x/week  PT DURATION: 12 weeks  PLANNED INTERVENTIONS: 97750- Physical Performance Testing, 97110-Therapeutic exercises, 97530- Therapeutic activity, [id number]- Neuromuscular re-education, 97535- Self Care, [id number]- Manual therapy, and [id number]- Gait training  PLAN FOR NEXT SESSION:   Progress Hip flexor intervention, endurance and extensors. Initiate static balance interventions reflective of BERG deficits  Training keeping COM over BOS   Note: Portions of this document were prepared using Dragon voice recognition software and although reviewed may contain unintentional dictation errors in syntax, grammar, or spelling.  Lonni KATHEE Gainer PT ,DPT Physical Therapist- [facility]   11:53 AM 08/20/24

## 2024-08-24 DIAGNOSIS — H9190 Unspecified hearing loss, unspecified ear: Secondary | ICD-10-CM | POA: Diagnosis not present

## 2024-08-24 DIAGNOSIS — F03A0 Unspecified dementia, mild, without behavioral disturbance, psychotic disturbance, mood disturbance, and anxiety: Secondary | ICD-10-CM | POA: Diagnosis not present

## 2024-08-24 DIAGNOSIS — R2689 Other abnormalities of gait and mobility: Secondary | ICD-10-CM | POA: Diagnosis not present

## 2024-08-25 ENCOUNTER — Ambulatory Visit: Admitting: Physical Therapy

## 2024-08-25 DIAGNOSIS — R2681 Unsteadiness on feet: Secondary | ICD-10-CM

## 2024-08-25 DIAGNOSIS — R262 Difficulty in walking, not elsewhere classified: Secondary | ICD-10-CM | POA: Diagnosis not present

## 2024-08-25 DIAGNOSIS — R2689 Other abnormalities of gait and mobility: Secondary | ICD-10-CM

## 2024-08-25 DIAGNOSIS — M6281 Muscle weakness (generalized): Secondary | ICD-10-CM

## 2024-08-25 NOTE — Therapy (Signed)
 OUTPATIENT PHYSICAL THERAPY NEURO TREATMENT/ Physical Therapy Progress Note   Dates of reporting period  07/22/24   to   08/25/24     Patient Name: Eugene Martinez. MRN: [id number] DOB:[birth year], 78 y.o., male Today's Date: 08/25/2024   PCP: Sadie Manna, MD  REFERRING PROVIDER: Caffaro, Kaitlin T, PA-C   END OF SESSION:  PT End of Session - 08/25/24 1156     Visit Number 30    Number of Visits 41    Date for PT Re-Evaluation 09/30/24    Progress Note Due on Visit 30    PT Start Time 1149    PT Stop Time 1227    PT Time Calculation (min) 38 min    Equipment Utilized During Treatment Gait belt    Activity Tolerance Patient tolerated treatment well    Behavior During Therapy WFL for tasks assessed/performed                        Past Medical History:  Diagnosis Date   Allergic state    Amyloidosis (HCC)    Anemia    BPH (benign prostatic hyperplasia)    DDD (degenerative disc disease), lumbar    Dysplastic nevus 10/13/2013   Left posterior waistline paraspinal. Moderate to severe atypia, pattern borders on early evolving MIS, edge involved. Excised 03/31/2014, margins free.   Dysplastic nevus 12/19/2017   Right upper back medial. Mild atypia, lateral and deep margins involved.    Dysplastic nevus 11/28/2022   right prox lat thigh, Severe atypia, Excised 01/08/23   Dysrhythmia    Environmental and seasonal allergies    Gallop rhythm    GERD (gastroesophageal reflux disease)    Hypertension    Mild mitral regurgitation    a. by echo 10/2012   Pain    bil. 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 10/2012 Echo: EF 55-60%, severe LVH with speckling of myocardium -? amyloid. Mild MR, mod dil LA.   Past Surgical History:  Procedure Laterality Date   CIRCUMCISION  1984   COLONOSCOPY  03/2012   COLONOSCOPY WITH PROPOFOL  N/A 10/29/2017   Procedure: COLONOSCOPY WITH PROPOFOL ;  Surgeon: Gaylyn Gladis PENNER, MD;  Location: [facility]  ENDOSCOPY;  Service: Endoscopy;  Laterality: N/A;   FOOT SURGERY     right foot   FOOT SURGERY Right 2017   LEG SURGERIES     HX.OF MULTIPLE SURGERIES FOR LEG LENGTH DUE TO POLIO   LUMBAR LAMINECTOMY/DECOMPRESSION MICRODISCECTOMY N/A 01/26/2019   Procedure: LUMBAR LAMINECTOMY/DECOMPRESSION MICRODISCECTOMY 1 LEVEL L3-4;  Surgeon: Bluford Standing, MD;  Location: [facility] ORS;  Service: Neurosurgery;  Laterality: N/A;   NECK SURGERY     PILONIDAL CYST / SINUS EXCISION     Patient Active Problem List   Diagnosis Date Noted   Ambulatory dysfunction 10/22/2023   Prediabetes 02/05/2023   Anemia 08/01/2020   Elevated hemoglobin A1c 08/01/2020   Loss of memory 08/01/2020   Sesamoiditis 06/25/2019   S/P lumbar laminectomy 01/26/2019   Weakness of both lower limbs 10/24/2018   LVH (left ventricular hypertrophy) 08/29/2016   Allergy 02/20/2016   Amyloidosis (HCC) 02/20/2016   Thrombocytopenia (HCC) 11/23/2015   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   Airway hyperreactivity 05/07/2013   Benign fibroma of prostate 05/07/2013   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   Chronic diastolic heart failure (HCC) 11/18/2012   Proteinuria, Bence Jones 11/18/2012   Multiple allergies 10/29/2012   Hypertension 10/29/2012  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 No updates since last session.   From Eval:Pt  wife report he has been inconsistent with exercise since discharge. She also assist with all of subjective. Pt has been wall and furniture surfing to get around home. Pt had one pseudo fall in last 6 months; he went on a 4's into the recycling can but could not get up. Pt has not been following trough with his exercise. Pt has been using the cane almost all of the time outside of the house in comparison to rollator.  Patient and caregiver want to work on general strength and mobility. Pt caregiver has some concerns with getting rollator into and out of the car.  As well as getting patient into and out of the car.  Pt accompanied by: significant other  PERTINENT HISTORY: . 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 No  PRECAUTIONS: Fall  RED FLAGS: None   WEIGHT BEARING RESTRICTIONS: No  FALLS: Has patient fallen in last 6 months? No fall but went to ground intentionally and could not get up   LIVING ENVIRONMENT: LIVING ENVIRONMENT: Lives with: lives with their spouse Lives in: House/apartment Stairs: 1 step into home Has following equipment at home: Retail banker - 2 wheeled *On Arts development officer for [employer] at [employer]  PLOF: Independent with household mobility with device and Independent with community mobility with device  PATIENT GOALS: Improve balance and mobility   OBJECTIVE:  Note: Objective measures were completed at Evaluation unless otherwise noted.   COGNITION: Overall cognitive status: Impaired and Mild cogintive impairment     POSTURE: rounded shoulders, forward head, and increased thoracic kyphosis  LOWER EXTREMITY ROM:   WNL for tasks assessed    LOWER EXTREMITY MMT:    MMT Right Eval Left Eval  Hip flexion 4 4  Hip extension    Hip abduction 4+ 4+  Hip adduction 4+ 4+  Hip internal rotation    Hip  external rotation    Knee flexion 4- 4-  Knee extension 4 4  Ankle dorsiflexion -   Ankle plantarflexion -   Ankle inversion    Ankle eversion    (Blank rows = not tested)  BED MOBILITY:  Not tested  TRANSFERS: Sit to stand: Complete Independence and on a few reps noted instability in standing  Assistive device utilized: None     Stand to sit: Complete Independence  Assistive device utilized: None      GAIT: Findings: Gait Characteristics: With cane pt does not use in classic 2 or 3 point pattern but uses sporadically, decreased step length- Right, and decreased step length- Left, Distance walked: 30 ft, Assistive device utilized:Single point cane, Level of assistance: CGA, and Comments: Describing gait with SPC in R UE   FUNCTIONAL TESTS:  5 times sit to stand: 15.55 sec, imbalance on a few Timed up and go (TUG): 22.62 sec with cane  6 minute walk test: Test visit 2  10 meter walk test: 22.67 sec with cane, 13.6 sec with rollator   Berg Balance Scale: Test visit 2    PATIENT SURVEYS:  LEFS  Extreme difficulty/unable (0), Quite a bit of difficulty (1), Moderate difficulty (2), Little difficulty (3), No difficulty (4) Survey date:  WIFE FILLS OUT 05/12/24  Any of your usual work, housework or school activities 3  2. Usual hobbies, recreational or sporting activities 2  3. Getting into/out of the bath 4  4. Walking between rooms 3  5. Putting on socks/shoes 2  6. Squatting  1  7. Lifting an object, like a bag of groceries from the floor 3  8. Performing light activities around your home 3  9. Performing heavy activities around your home 2  10. Getting into/out of a car 1  11. Walking 2 blocks 2  12. Walking 1 mile 1  13. Going up/down 10 stairs (1 flight) 1  14. Standing for 1 hour 2  15.  sitting for 1 hour 4  16. Running on even ground 1  17. Running on uneven ground 1  18. Making sharp turns while running fast 1  19. Hopping  0  20. Rolling over in bed 2  Score  total:  40                                                                                                                                 TREATMENT DATE: 08/25/24   Physical Performance Test or Measurement: a  physical performance test(s) or measurement (eg,  musculoskeletal, functional capacity), with written report,  each 15 mins   PT instructed pt in TUG: 14.73 sec with 4WW 16.2 sec no AD sec ( >13.5 sec indicates increased fall risk)  10 Meter Walk Test: Patient instructed to walk 10 meters (32.8 ft) as quickly and as safely as possible at  their normal speed Results: 9/16: .78 with 4WW m/s   Cut off scores:   Household Ambulator  < 0.4 m/s  Limited Community Ambulator  0.4 - 0.8 m/s  [employer]  > 0.8 m/s  Increased fall risk  < 1.58m/s  Crossing a Street  >1.10m/s  MCID 0.05 m/s (small), 0.13 m/s (moderate), 0.06 m/s (significant)  (ANPTA Core Set of Outcome Measures for Adults with Neurologic Conditions, 2018)       TA- To improve functional movements patterns for everyday tasks   STS x20 reps no UE assist, cue for forward weight shift   Gait around obstacles working on turning - no AD 2 x 100 ft   Sidestepping 2  x 20 ft with CGA no AD   Forward and retro gait x 20 ft ea no AD but CGA     PATIENT EDUCATION: Education details: Pt educated throughout session about proper posture and technique with exercises. Improved exercise technique, movement at target joints, use of target muscles after min to mod verbal, visual, tactile cues. Person educated: Patient Education method: Explanation Education comprehension: verbalized understanding   HOME EXERCISE PROGRAM: Verbally added mini squat at bar to replace STS. No new handout provided, has handout from previous PT  Verbally added HS flexion with BTB to HEP. No new handout provided, has handout from previous PT    GOALS:/ :Goals reviewed with patient? Yes  SHORT TERM GOALS: Target date: 06/09/2024    Patient will be independent in home exercise program to improve strength/mobility for better functional independence with ADLs. Baseline: No HEP currently 8/11: Diligently completing HEP  Goal status: MET  LONG TERM GOALS: Target date: 08/04/2024  1.  Patient will complete five times sit to stand test in < 15 seconds without LOB indicating an increased LE strength and improved balance. Baseline: 15.55 sec, imbalance on a few reps requiring CGA for safety 7/7:14.11 sec 8/11:15.62 sec no UE 8/20:16.86 sec no UE support 8/27:13.95 sec no UE support Goal status: MET  2.  Patient will improve LEFS score to 50   to demonstrate statistically significant improvement in mobility and quality of life as it relates to their LE strength and mobility.  Baseline: 40 7/7:42 8/27:41 9/16:48 Goal status: ONGOING   3.  Patient will increase Berg Balance score by > 6 points to demonstrate decreased fall risk during functional activities. Baseline: 38 8/11:43 8/27: 46 Goal status: MET   4.   Patient will reduce timed up and go to <11 seconds to reduce fall risk and demonstrate improved transfer/gait ability. Baseline: 22.62 sec with SPC  8/11:15.39 sec with 4WW 8/27: 15.1 sec with RW 14.7 sec with no AD 9/16:14.73 sec with 4WW 16.2 sec no AD Goal status: ONGOING  5.   Patient will increase 10 meter walk test to >1.29m/s as to improve gait speed for better community ambulation and to reduce fall risk. Baseline: .44 m/s w/ SPC, .73 m/s with rollator .75 m/s with rollator 9/16: .78 m/s with 4WW Goal status: ONGOING  6.   Patient will increase six minute walk test distance by 150 ft or greater for progression to community ambulator and improve gait ability Baseline: progress note 7/7:750 ft, increased shuffling style gait from minute 2 and on as well as increased trunk flexion from this point on as well. 8/11: 750 ft with 4WW, improved gait quality ( no shuffling until last minute) and improved trunk flexion  posture. Similar speed but less fatigue 8/27: 917 ft  with 4WW; flexed posture dominates gait and causing shuffling last 1.5 min  Goal status: MET    ASSESSMENT:  CLINICAL IMPRESSION:    Patient arrived with good motivation for completion of pt activities. Pt nearing plateau with therapy but has met many of his long term goals. Will move toward finalizing independent exercise program for home. Will likely need increased practice due to his dementia. Pt will continue to benefit from skilled physical therapy intervention to address impairments, improve QOL, and attain therapy goals. Patient's condition has the potential to improve in response to therapy. Maximum improvement is yet to be obtained. The anticipated improvement is attainable and reasonable in a generally predictable time.      OBJECTIVE IMPAIRMENTS: Abnormal gait, decreased activity tolerance, decreased balance, decreased endurance, decreased knowledge of condition, decreased knowledge of use of DME, and decreased strength.   ACTIVITY LIMITATIONS: standing, squatting, stairs, transfers, and locomotion level  PARTICIPATION LIMITATIONS: meal prep, shopping, and community activity  PERSONAL FACTORS: Age, Behavior pattern, and 3+ comorbidities: HTN, Chronic Diastolic Heart Failure, post polio,  are also affecting patient's functional outcome.   REHAB POTENTIAL: Good  CLINICAL DECISION MAKING: Evolving/moderate complexity  EVALUATION COMPLEXITY: Moderate  PLAN:  PT FREQUENCY: 2x/week  PT DURATION: 12 weeks  PLANNED INTERVENTIONS: 97750- Physical Performance Testing, 97110-Therapeutic exercises, 97530- Therapeutic activity, [id number]- Neuromuscular re-education, 97535- Self Care, [id number]- Manual therapy, and [id number]- Gait training  PLAN FOR NEXT SESSION:   Progress Hip flexor intervention, endurance and extensors. Initiate static balance interventions reflective of BERG deficits  Training keeping COM over BOS   Note: Portions of  this document were prepared using Dragon voice recognition software and although reviewed may contain unintentional dictation errors in syntax, grammar, or spelling.  Lonni KATHEE Gainer PT ,DPT Physical Therapist- [facility]   1:09 PM 08/25/24

## 2024-08-26 NOTE — Therapy (Unsigned)
 OUTPATIENT PHYSICAL THERAPY NEURO TREATMENT   Patient Name: Eugene Martinez. MRN: [id number] DOB:04-Jan-1946, 78 y.o., male Today's Date: 08/27/2024   PCP: Sadie Manna, MD  REFERRING PROVIDER: Caffaro, Kaitlin T, PA-C   END OF SESSION:  PT End of Session - 08/27/24 1153     Visit Number 31    Number of Visits 41    Date for Recertification  09/30/24    Progress Note Due on Visit 40    PT Start Time 1150    PT Stop Time 1228    PT Time Calculation (min) 38 min    Equipment Utilized During Treatment Gait belt    Activity Tolerance Patient tolerated treatment well    Behavior During Therapy WFL for tasks assessed/performed                         Past Medical History:  Diagnosis Date   Allergic state    Amyloidosis (HCC)    Anemia    BPH (benign prostatic hyperplasia)    DDD (degenerative disc disease), lumbar    Dysplastic nevus 10/13/2013   Left posterior waistline paraspinal. Moderate to severe atypia, pattern borders on early evolving MIS, edge involved. Excised 03/31/2014, margins free.   Dysplastic nevus 12/19/2017   Right upper back medial. Mild atypia, lateral and deep margins involved.    Dysplastic nevus 11/28/2022   right prox lat thigh, Severe atypia, Excised 01/08/23   Dysrhythmia    Environmental and seasonal allergies    Gallop rhythm    GERD (gastroesophageal reflux disease)    Hypertension    Mild mitral regurgitation    a. by echo 10/2012   Pain    bil. 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 10/2012 Echo: EF 55-60%, severe LVH with speckling of myocardium -? amyloid. Mild MR, mod dil LA.   Past Surgical History:  Procedure Laterality Date   CIRCUMCISION  1984   COLONOSCOPY  03/2012   COLONOSCOPY WITH PROPOFOL  N/A 10/29/2017   Procedure: COLONOSCOPY WITH PROPOFOL ;  Surgeon: Gaylyn Gladis PENNER, MD;  Location: [facility] ENDOSCOPY;  Service: Endoscopy;  Laterality: N/A;   FOOT SURGERY     right foot    FOOT SURGERY Right 2017   LEG SURGERIES     HX.OF MULTIPLE SURGERIES FOR LEG LENGTH DUE TO POLIO   LUMBAR LAMINECTOMY/DECOMPRESSION MICRODISCECTOMY N/A 01/26/2019   Procedure: LUMBAR LAMINECTOMY/DECOMPRESSION MICRODISCECTOMY 1 LEVEL L3-4;  Surgeon: Bluford Standing, MD;  Location: [facility] ORS;  Service: Neurosurgery;  Laterality: N/A;   NECK SURGERY     PILONIDAL CYST / SINUS EXCISION     Patient Active Problem List   Diagnosis Date Noted   Ambulatory dysfunction 10/22/2023   Prediabetes 02/05/2023   Anemia 08/01/2020   Elevated hemoglobin A1c 08/01/2020   Loss of memory 08/01/2020   Sesamoiditis 06/25/2019   S/P lumbar laminectomy 01/26/2019   Weakness of both lower limbs 10/24/2018   LVH (left ventricular hypertrophy) 08/29/2016   Allergy 02/20/2016   Amyloidosis (HCC) 02/20/2016   Thrombocytopenia (HCC) 11/23/2015   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   Airway hyperreactivity 05/07/2013   Benign fibroma of prostate 05/07/2013   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   Chronic diastolic heart failure (HCC) 11/18/2012   Proteinuria, Bence Jones 11/18/2012   Multiple allergies 10/29/2012   Hypertension 10/29/2012   RBBB (right bundle branch block with left anterior fascicular block) 10/29/2012   Proteinuria 10/29/2012  ONSET DATE: 04/22/24  REFERRING DIAG: R26.9 (ICD-10-CM) - Altered gait   THERAPY DIAG:  Difficulty in walking, not elsewhere classified  Unsteadiness on feet  Other abnormalities of gait and mobility  Muscle weakness (generalized)  Rationale for Evaluation and Treatment: Rehabilitation  SUBJECTIVE:                                                                                                                                                                                             SUBJECTIVE STATEMENT:   Pt reports no falls. No updates since last session.  Patient's caregiver reports having increased difficulty getting in and out of the car mostly due to  increased amount of time at this taking.  From Eval:Pt wife report he has been inconsistent with exercise since discharge. She also assist with all of subjective. Pt has been wall and furniture surfing to get around home. Pt had one pseudo fall in last 6 months; he went on a 4's into the recycling can but could not get up. Pt has not been following trough with his exercise. Pt has been using the cane almost all of the time outside of the house in comparison to rollator.  Patient and caregiver want to work on general strength and mobility. Pt caregiver has some concerns with getting rollator into and out of the car.  As well as getting patient into and out of the car.  Pt accompanied by: significant other  PERTINENT HISTORY: . 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 No  PRECAUTIONS: Fall  RED FLAGS: None   WEIGHT BEARING RESTRICTIONS: No  FALLS: Has patient fallen in last 6 months? No fall but went to ground intentionally and could not get up   LIVING ENVIRONMENT: LIVING ENVIRONMENT: Lives with: lives with their spouse Lives in: House/apartment Stairs: 1 step into home Has following equipment at home: Retail banker - 2 wheeled *On Arts development officer for [employer]  PLOF: Independent with household mobility with device and Independent with community mobility with device  PATIENT GOALS: Improve balance and mobility   OBJECTIVE:  Note: Objective measures were completed at Evaluation unless otherwise noted.   COGNITION: Overall cognitive status: Impaired and Mild cogintive impairment     POSTURE: rounded shoulders, forward head, and increased thoracic kyphosis  LOWER EXTREMITY ROM:   WNL for tasks assessed    LOWER EXTREMITY MMT:    MMT Right Eval Left Eval  Hip flexion 4 4  Hip extension  Hip abduction 4+  4+  Hip adduction 4+ 4+  Hip internal rotation    Hip external rotation    Knee flexion 4- 4-  Knee extension 4 4  Ankle dorsiflexion -   Ankle plantarflexion -   Ankle inversion    Ankle eversion    (Blank rows = not tested)  BED MOBILITY:  Not tested  TRANSFERS: Sit to stand: Complete Independence and on a few reps noted instability in standing  Assistive device utilized: None     Stand to sit: Complete Independence  Assistive device utilized: None      GAIT: Findings: Gait Characteristics: With cane pt does not use in classic 2 or 3 point pattern but uses sporadically, decreased step length- Right, and decreased step length- Left, Distance walked: 30 ft, Assistive device utilized:Single point cane, Level of assistance: CGA, and Comments: Describing gait with SPC in R UE   FUNCTIONAL TESTS:  5 times sit to stand: 15.55 sec, imbalance on a few Timed up and go (TUG): 22.62 sec with cane  6 minute walk test: Test visit 2  10 meter walk test: 22.67 sec with cane, 13.6 sec with rollator   Berg Balance Scale: Test visit 2    PATIENT SURVEYS:  LEFS  Extreme difficulty/unable (0), Quite a bit of difficulty (1), Moderate difficulty (2), Little difficulty (3), No difficulty (4) Survey date:  WIFE FILLS OUT 05/12/24  Any of your usual work, housework or school activities 3  2. Usual hobbies, recreational or sporting activities 2  3. Getting into/out of the bath 4  4. Walking between rooms 3  5. Putting on socks/shoes 2  6. Squatting  1  7. Lifting an object, like a bag of groceries from the floor 3  8. Performing light activities around your home 3  9. Performing heavy activities around your home 2  10. Getting into/out of a car 1  11. Walking 2 blocks 2  12. Walking 1 mile 1  13. Going up/down 10 stairs (1 flight) 1  14. Standing for 1 hour 2  15.  sitting for 1 hour 4  16. Running on even ground 1  17. Running on uneven ground 1  18. Making sharp turns while running fast 1   19. Hopping  0  20. Rolling over in bed 2  Score total:  40                                                                                                                                 TREATMENT DATE: 08/27/24     TA- To improve functional movements patterns for everyday tasks   Car entering and exiting practice x 5 reps - in armless chair, step over 1/2 foam roller to simulate door threshold for 90 degree turn then STS then return. Cues for sequencing.   STS 2x10 reps no UE assist, cue for forward weight shift   Gait with 5TT  x 325 ft with 5#AW   Forward and retro gait x 20 ft ea no AD but CGA   Gait without AD 2 x 50 ft - cues for step length   TE- To improve strength, endurance, mobility, and function of specific targeted muscle groups or improve joint range of motion or improve muscle flexibility  Pt instructed in self donning 5#AW- some assistance required  LA@ 2 x 15 reps ea  Seated alternating march 2 x 10 reps  Seated step over 1/2 foam roller 2 x 10 ea   PATIENT EDUCATION: Education details: Pt educated throughout session about proper posture and technique with exercises. Improved exercise technique, movement at target joints, use of target muscles after min to mod verbal, visual, tactile cues. Person educated: Patient Education method: Explanation Education comprehension: verbalized understanding   HOME EXERCISE PROGRAM: Verbally added mini squat at bar to replace STS. No new handout provided, has handout from previous PT  Verbally added HS flexion with BTB to HEP. No new handout provided, has handout from previous PT    GOALS:/ :Goals reviewed with patient? Yes  SHORT TERM GOALS: Target date: 06/09/2024   Patient will be independent in home exercise program to improve strength/mobility for better functional independence with ADLs. Baseline: No HEP currently 8/11: Diligently completing HEP  Goal status: MET  LONG TERM GOALS: Target date: 08/04/2024  1.   Patient will complete five times sit to stand test in < 15 seconds without LOB indicating an increased LE strength and improved balance. Baseline: 15.55 sec, imbalance on a few reps requiring CGA for safety 7/7:14.11 sec 8/11:15.62 sec no UE 8/20:16.86 sec no UE support 8/27:13.95 sec no UE support Goal status: MET  2.  Patient will improve LEFS score to 50   to demonstrate statistically significant improvement in mobility and quality of life as it relates to their LE strength and mobility.  Baseline: 40 7/7:42 8/27:41 9/16:48 Goal status: ONGOING   3.  Patient will increase Berg Balance score by > 6 points to demonstrate decreased fall risk during functional activities. Baseline: 38 8/11:43 8/27: 46 Goal status: MET   4.   Patient will reduce timed up and go to <11 seconds to reduce fall risk and demonstrate improved transfer/gait ability. Baseline: 22.62 sec with SPC  8/11:15.39 sec with 4WW 8/27: 15.1 sec with RW 14.7 sec with no AD 9/16:14.73 sec with 4WW 16.2 sec no AD Goal status: ONGOING  5.   Patient will increase 10 meter walk test to >1.71m/s as to improve gait speed for better community ambulation and to reduce fall risk. Baseline: .44 m/s w/ SPC, .73 m/s with rollator .75 m/s with rollator 9/16: .78 m/s with 4WW Goal status: ONGOING  6.   Patient will increase six minute walk test distance by 150 ft or greater for progression to community ambulator and improve gait ability Baseline: progress note 7/7:750 ft, increased shuffling style gait from minute 2 and on as well as increased trunk flexion from this point on as well. 8/11: 750 ft with 4WW, improved gait quality ( no shuffling until last minute) and improved trunk flexion posture. Similar speed but less fatigue 8/27: 917 ft with 4WW; flexed posture dominates gait and causing shuffling last 1.5 min  Goal status: MET    ASSESSMENT:  CLINICAL IMPRESSION:    Patient presents with good motivation for physical therapy  activities.  Practiced with activities to improve patient's ability to perform car transfers this date.  In clinic patient  shows good ability to adapt to the scenarios but per his wife reporting he is having difficulty and relies in area with the car.  Will continue to provide patient with activities that can stimulate and improve his capabilities.  Patient practiced with exercises to complete at home safely this date and will continue to benefit from continued practice and instruction in future sessions as patient tends to forget particularly instructions with form and exercises. Pt will continue to benefit from skilled physical therapy intervention to address impairments, improve QOL, and attain therapy goals.    OBJECTIVE IMPAIRMENTS: Abnormal gait, decreased activity tolerance, decreased balance, decreased endurance, decreased knowledge of condition, decreased knowledge of use of DME, and decreased strength.   ACTIVITY LIMITATIONS: standing, squatting, stairs, transfers, and locomotion level  PARTICIPATION LIMITATIONS: meal prep, shopping, and community activity  PERSONAL FACTORS: Age, Behavior pattern, and 3+ comorbidities: HTN, Chronic Diastolic Heart Failure, post polio,  are also affecting patient's functional outcome.   REHAB POTENTIAL: Good  CLINICAL DECISION MAKING: Evolving/moderate complexity  EVALUATION COMPLEXITY: Moderate  PLAN:  PT FREQUENCY: 2x/week  PT DURATION: 12 weeks  PLANNED INTERVENTIONS: 97750- Physical Performance Testing, 97110-Therapeutic exercises, 97530- Therapeutic activity, [id number]- Neuromuscular re-education, 97535- Self Care, [id number]- Manual therapy, and [id number]- Gait training  PLAN FOR NEXT SESSION:   Progress Hip flexor intervention, endurance and extensors. Initiate static balance interventions reflective of BERG deficits  Training keeping COM over BOS   Note: Portions of this document were prepared using Dragon voice recognition software and although  reviewed may contain unintentional dictation errors in syntax, grammar, or spelling.  Lonni KATHEE Gainer PT ,DPT Physical Therapist- [facility]   11:54 AM 08/27/24

## 2024-08-27 ENCOUNTER — Ambulatory Visit: Admitting: Physical Therapy

## 2024-08-27 DIAGNOSIS — R262 Difficulty in walking, not elsewhere classified: Secondary | ICD-10-CM | POA: Diagnosis not present

## 2024-08-27 DIAGNOSIS — R2681 Unsteadiness on feet: Secondary | ICD-10-CM

## 2024-08-27 DIAGNOSIS — M6281 Muscle weakness (generalized): Secondary | ICD-10-CM

## 2024-08-27 DIAGNOSIS — R2689 Other abnormalities of gait and mobility: Secondary | ICD-10-CM

## 2024-09-01 ENCOUNTER — Ambulatory Visit: Admitting: Physical Therapy

## 2024-09-01 DIAGNOSIS — R278 Other lack of coordination: Secondary | ICD-10-CM

## 2024-09-01 DIAGNOSIS — M6281 Muscle weakness (generalized): Secondary | ICD-10-CM

## 2024-09-01 DIAGNOSIS — R2689 Other abnormalities of gait and mobility: Secondary | ICD-10-CM

## 2024-09-01 DIAGNOSIS — R262 Difficulty in walking, not elsewhere classified: Secondary | ICD-10-CM

## 2024-09-01 DIAGNOSIS — R2681 Unsteadiness on feet: Secondary | ICD-10-CM

## 2024-09-01 NOTE — Therapy (Signed)
 OUTPATIENT PHYSICAL THERAPY NEURO TREATMENT   Patient Name: Eugene Martinez. MRN: [id number] DOB:1946/02/02, 78 y.o., male Today's Date: 09/01/2024   PCP: Sadie Manna, MD  REFERRING PROVIDER: Caffaro, Kaitlin T, PA-C   END OF SESSION:  PT End of Session - 09/01/24 1148     Visit Number 32    Number of Visits 41    Date for Recertification  09/30/24    Progress Note Due on Visit 40    PT Start Time 1149    PT Stop Time 1229    PT Time Calculation (min) 40 min    Equipment Utilized During Treatment Gait belt    Activity Tolerance Patient tolerated treatment well    Behavior During Therapy WFL for tasks assessed/performed                         Past Medical History:  Diagnosis Date   Allergic state    Amyloidosis (HCC)    Anemia    BPH (benign prostatic hyperplasia)    DDD (degenerative disc disease), lumbar    Dysplastic nevus 10/13/2013   Left posterior waistline paraspinal. Moderate to severe atypia, pattern borders on early evolving MIS, edge involved. Excised 03/31/2014, margins free.   Dysplastic nevus 12/19/2017   Right upper back medial. Mild atypia, lateral and deep margins involved.    Dysplastic nevus 11/28/2022   right prox lat thigh, Severe atypia, Excised 01/08/23   Dysrhythmia    Environmental and seasonal allergies    Gallop rhythm    GERD (gastroesophageal reflux disease)    Hypertension    Mild mitral regurgitation    a. by echo 10/2012   Pain    bil. 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 10/2012 Echo: EF 55-60%, severe LVH with speckling of myocardium -? amyloid. Mild MR, mod dil LA.   Past Surgical History:  Procedure Laterality Date   CIRCUMCISION  1984   COLONOSCOPY  03/2012   COLONOSCOPY WITH PROPOFOL  N/A 10/29/2017   Procedure: COLONOSCOPY WITH PROPOFOL ;  Surgeon: Gaylyn Gladis PENNER, MD;  Location: [facility] ENDOSCOPY;  Service: Endoscopy;  Laterality: N/A;   FOOT SURGERY     right foot    FOOT SURGERY Right 2017   LEG SURGERIES     HX.OF MULTIPLE SURGERIES FOR LEG LENGTH DUE TO POLIO   LUMBAR LAMINECTOMY/DECOMPRESSION MICRODISCECTOMY N/A 01/26/2019   Procedure: LUMBAR LAMINECTOMY/DECOMPRESSION MICRODISCECTOMY 1 LEVEL L3-4;  Surgeon: Bluford Standing, MD;  Location: [facility] ORS;  Service: Neurosurgery;  Laterality: N/A;   NECK SURGERY     PILONIDAL CYST / SINUS EXCISION     Patient Active Problem List   Diagnosis Date Noted   Ambulatory dysfunction 10/22/2023   Prediabetes 02/05/2023   Anemia 08/01/2020   Elevated hemoglobin A1c 08/01/2020   Loss of memory 08/01/2020   Sesamoiditis 06/25/2019   S/P lumbar laminectomy 01/26/2019   Weakness of both lower limbs 10/24/2018   LVH (left ventricular hypertrophy) 08/29/2016   Allergy 02/20/2016   Amyloidosis (HCC) 02/20/2016   Thrombocytopenia 11/23/2015   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   Airway hyperreactivity 05/07/2013   Benign fibroma of prostate 05/07/2013   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   Chronic diastolic heart failure (HCC) 11/18/2012   Proteinuria, Bence Jones 11/18/2012   Multiple allergies 10/29/2012   Hypertension 10/29/2012   RBBB (right bundle branch block with left anterior fascicular block) 10/29/2012   Proteinuria 10/29/2012  ONSET DATE: 04/22/24  REFERRING DIAG: R26.9 (ICD-10-CM) - Altered gait   THERAPY DIAG:  Difficulty in walking, not elsewhere classified  Unsteadiness on feet  Other abnormalities of gait and mobility  Muscle weakness (generalized)  Other lack of coordination  Rationale for Evaluation and Treatment: Rehabilitation  SUBJECTIVE:                                                                                                                                                                                             SUBJECTIVE STATEMENT:   Pt reports that he is doing well. No pain reported. Is still using quadcane when going out. Wife reports that when the uses the  cane, he is shuffling.   From Eval:Pt wife report he has been inconsistent with exercise since discharge. She also assist with all of subjective. Pt has been wall and furniture surfing to get around home. Pt had one pseudo fall in last 6 months; he went on a 4's into the recycling can but could not get up. Pt has not been following trough with his exercise. Pt has been using the cane almost all of the time outside of the house in comparison to rollator.  Patient and caregiver want to work on general strength and mobility. Pt caregiver has some concerns with getting rollator into and out of the car.  As well as getting patient into and out of the car.  Pt accompanied by: significant other  PERTINENT HISTORY: . 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 No  PRECAUTIONS: Fall  RED FLAGS: None   WEIGHT BEARING RESTRICTIONS: No  FALLS: Has patient fallen in last 6 months? No fall but went to ground intentionally and could not get up   LIVING ENVIRONMENT: LIVING ENVIRONMENT: Lives with: lives with their spouse Lives in: House/apartment Stairs: 1 step into home Has following equipment at home: Retail banker - 2 wheeled *On Arts development officer for  [employer] at [employer]  PLOF: Independent with household mobility with device and Independent with community mobility with device  PATIENT GOALS: Improve balance and mobility   OBJECTIVE:  Note: Objective measures were completed at Evaluation unless otherwise noted.   COGNITION: Overall cognitive status: Impaired and Mild cogintive impairment     POSTURE: rounded shoulders, forward head, and increased thoracic kyphosis  LOWER EXTREMITY ROM:   WNL for tasks assessed    LOWER EXTREMITY MMT:    MMT Right Eval Left Eval  Hip flexion 4 4  Hip extension  Hip abduction 4+ 4+  Hip adduction  4+ 4+  Hip internal rotation    Hip external rotation    Knee flexion 4- 4-  Knee extension 4 4  Ankle dorsiflexion -   Ankle plantarflexion -   Ankle inversion    Ankle eversion    (Blank rows = not tested)  BED MOBILITY:  Not tested  TRANSFERS: Sit to stand: Complete Independence and on a few reps noted instability in standing  Assistive device utilized: None     Stand to sit: Complete Independence  Assistive device utilized: None      GAIT: Findings: Gait Characteristics: With cane pt does not use in classic 2 or 3 point pattern but uses sporadically, decreased step length- Right, and decreased step length- Left, Distance walked: 30 ft, Assistive device utilized:Single point cane, Level of assistance: CGA, and Comments: Describing gait with SPC in R UE   FUNCTIONAL TESTS:  5 times sit to stand: 15.55 sec, imbalance on a few Timed up and go (TUG): 22.62 sec with cane  6 minute walk test: Test visit 2  10 meter walk test: 22.67 sec with cane, 13.6 sec with rollator   Berg Balance Scale: Test visit 2    PATIENT SURVEYS:  LEFS  Extreme difficulty/unable (0), Quite a bit of difficulty (1), Moderate difficulty (2), Little difficulty (3), No difficulty (4) Survey date:  WIFE FILLS OUT 05/12/24  Any of your usual work, housework or school activities 3  2. Usual hobbies, recreational or sporting activities 2  3. Getting into/out of the bath 4  4. Walking between rooms 3  5. Putting on socks/shoes 2  6. Squatting  1  7. Lifting an object, like a bag of groceries from the floor 3  8. Performing light activities around your home 3  9. Performing heavy activities around your home 2  10. Getting into/out of a car 1  11. Walking 2 blocks 2  12. Walking 1 mile 1  13. Going up/down 10 stairs (1 flight) 1  14. Standing for 1 hour 2  15.  sitting for 1 hour 4  16. Running on even ground 1  17. Running on uneven ground 1  18. Making sharp turns while running fast 1  19. Hopping  0   20. Rolling over in bed 2  Score total:  40                                                                                                                                 TREATMENT DATE: 09/01/24   Gait:  With QC in the RUE 2x 171ft, in the LUE x 139ft with cues for reciprocal pattern. Additional gait without AD x 157ft with min cues for symmetrical step length. Difficulty with coordinating reciprocal pattern with AD in the RUE.  Gait with 4WW x 450+ 220ft without resistance instruction for improved step symmetry on BLE   TA- To improve  functional movements patterns for everyday tasks   STS 2x10 reps no UE assist, cue for forward weight slight decrease in eccentric control for second bout.    Weave through 4 cones x 4 with no AD and CGA for safety from PT. Min cues for improved turn radius to prevent need to step over cones.   Forward foot taps on 6inch step x 10 bil  Lateral foot taps on 6inch step x 10 bil   Throughout session, PT provided CGA for safety to reduce fall risk and provide tactile cues for proper weight shift with turns while ambulating without AD.   PATIENT EDUCATION: Education details: Pt educated throughout session about proper posture and technique with exercises. Improved exercise technique, movement at target joints, use of target muscles after min to mod verbal, visual, tactile cues. Person educated: Patient Education method: Explanation Education comprehension: verbalized understanding   HOME EXERCISE PROGRAM: Verbally added mini squat at bar to replace STS. No new handout provided, has handout from previous PT  Verbally added HS flexion with BTB to HEP. No new handout provided, has handout from previous PT    GOALS:/ :Goals reviewed with patient? Yes  SHORT TERM GOALS: Target date: 06/09/2024   Patient will be independent in home exercise program to improve strength/mobility for better functional independence with ADLs. Baseline: No HEP currently  8/11: Diligently completing HEP  Goal status: MET  LONG TERM GOALS: Target date: 08/04/2024  1.  Patient will complete five times sit to stand test in < 15 seconds without LOB indicating an increased LE strength and improved balance. Baseline: 15.55 sec, imbalance on a few reps requiring CGA for safety 7/7:14.11 sec 8/11:15.62 sec no UE 8/20:16.86 sec no UE support 8/27:13.95 sec no UE support Goal status: MET  2.  Patient will improve LEFS score to 50   to demonstrate statistically significant improvement in mobility and quality of life as it relates to their LE strength and mobility.  Baseline: 40 7/7:42 8/27:41 9/16:48 Goal status: ONGOING   3.  Patient will increase Berg Balance score by > 6 points to demonstrate decreased fall risk during functional activities. Baseline: 38 8/11:43 8/27: 46 Goal status: MET   4.   Patient will reduce timed up and go to <11 seconds to reduce fall risk and demonstrate improved transfer/gait ability. Baseline: 22.62 sec with SPC  8/11:15.39 sec with 4WW 8/27: 15.1 sec with RW 14.7 sec with no AD 9/16:14.73 sec with 4WW 16.2 sec no AD Goal status: ONGOING  5.   Patient will increase 10 meter walk test to >1.92m/s as to improve gait speed for better community ambulation and to reduce fall risk. Baseline: .44 m/s w/ SPC, .73 m/s with rollator .75 m/s with rollator 9/16: .78 m/s with 4WW Goal status: ONGOING  6.   Patient will increase six minute walk test distance by 150 ft or greater for progression to community ambulator and improve gait ability Baseline: progress note 7/7:750 ft, increased shuffling style gait from minute 2 and on as well as increased trunk flexion from this point on as well. 8/11: 750 ft with 4WW, improved gait quality ( no shuffling until last minute) and improved trunk flexion posture. Similar speed but less fatigue 8/27: 917 ft with 4WW; flexed posture dominates gait and causing shuffling last 1.5 min  Goal status:  MET    ASSESSMENT:  CLINICAL IMPRESSION:    Patient presents with good motivation for physical therapy activities.  PT treatment focused gait training with  various AD with instruction for reciprocal pattern with QC and without AD. Cues for improved step height and improved symmetry of gait pattern.  Pt will continue to benefit from skilled physical therapy intervention to address impairments, improve QOL, and attain therapy goals.    OBJECTIVE IMPAIRMENTS: Abnormal gait, decreased activity tolerance, decreased balance, decreased endurance, decreased knowledge of condition, decreased knowledge of use of DME, and decreased strength.   ACTIVITY LIMITATIONS: standing, squatting, stairs, transfers, and locomotion level  PARTICIPATION LIMITATIONS: meal prep, shopping, and community activity  PERSONAL FACTORS: Age, Behavior pattern, and 3+ comorbidities: HTN, Chronic Diastolic Heart Failure, post polio,  are also affecting patient's functional outcome.   REHAB POTENTIAL: Good  CLINICAL DECISION MAKING: Evolving/moderate complexity  EVALUATION COMPLEXITY: Moderate  PLAN:  PT FREQUENCY: 2x/week  PT DURATION: 12 weeks  PLANNED INTERVENTIONS: 97750- Physical Performance Testing, 97110-Therapeutic exercises, 97530- Therapeutic activity, [id number]- Neuromuscular re-education, 97535- Self Care, [id number]- Manual therapy, and [id number]- Gait training  PLAN FOR NEXT SESSION:   Progress Hip flexor intervention, endurance and extensors. Initiate static balance interventions reflective of BERG deficits  Training keeping COM over BOS    Massie FORBES Dollar PT ,DPT Physical Therapist- [facility]  [facility]   11:49 AM 09/01/24

## 2024-09-03 ENCOUNTER — Ambulatory Visit: Admitting: Physical Therapy

## 2024-09-03 DIAGNOSIS — R2681 Unsteadiness on feet: Secondary | ICD-10-CM

## 2024-09-03 DIAGNOSIS — R278 Other lack of coordination: Secondary | ICD-10-CM

## 2024-09-03 DIAGNOSIS — M6281 Muscle weakness (generalized): Secondary | ICD-10-CM

## 2024-09-03 DIAGNOSIS — R262 Difficulty in walking, not elsewhere classified: Secondary | ICD-10-CM

## 2024-09-03 DIAGNOSIS — R2689 Other abnormalities of gait and mobility: Secondary | ICD-10-CM

## 2024-09-03 NOTE — Therapy (Signed)
 OUTPATIENT PHYSICAL THERAPY NEURO TREATMENT   Patient Name: Eugene Martinez. MRN: [id number] DOB:[birth year], 78 y.o., male Today's Date: 09/03/2024   PCP: Sadie Manna, MD  REFERRING PROVIDER: Caffaro, Kaitlin T, PA-C   END OF SESSION:  PT End of Session - 09/03/24 1026     Visit Number 33    Number of Visits 41    Date for Recertification  09/30/24    Progress Note Due on Visit 40    PT Start Time 1021    PT Stop Time 1100    PT Time Calculation (min) 39 min    Equipment Utilized During Treatment Gait belt    Activity Tolerance Patient tolerated treatment well    Behavior During Therapy WFL for tasks assessed/performed                         Past Medical History:  Diagnosis Date   Allergic state    Amyloidosis (HCC)    Anemia    BPH (benign prostatic hyperplasia)    DDD (degenerative disc disease), lumbar    Dysplastic nevus 10/13/2013   Left posterior waistline paraspinal. Moderate to severe atypia, pattern borders on early evolving MIS, edge involved. Excised 03/31/2014, margins free.   Dysplastic nevus 12/19/2017   Right upper back medial. Mild atypia, lateral and deep margins involved.    Dysplastic nevus 11/28/2022   right prox lat thigh, Severe atypia, Excised 01/08/23   Dysrhythmia    Environmental and seasonal allergies    Gallop rhythm    GERD (gastroesophageal reflux disease)    Hypertension    Mild mitral regurgitation    a. by echo 10/2012   Pain    bil. 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 10/2012 Echo: EF 55-60%, severe LVH with speckling of myocardium -? amyloid. Mild MR, mod dil LA.   Past Surgical History:  Procedure Laterality Date   CIRCUMCISION  1984   COLONOSCOPY  03/2012   COLONOSCOPY WITH PROPOFOL  N/A 10/29/2017   Procedure: COLONOSCOPY WITH PROPOFOL ;  Surgeon: Gaylyn Gladis PENNER, MD;  Location: [facility] ENDOSCOPY;  Service: Endoscopy;  Laterality: N/A;   FOOT SURGERY     right foot    FOOT SURGERY Right 2017   LEG SURGERIES     HX.OF MULTIPLE SURGERIES FOR LEG LENGTH DUE TO POLIO   LUMBAR LAMINECTOMY/DECOMPRESSION MICRODISCECTOMY N/A 01/26/2019   Procedure: LUMBAR LAMINECTOMY/DECOMPRESSION MICRODISCECTOMY 1 LEVEL L3-4;  Surgeon: Bluford Standing, MD;  Location: [facility] ORS;  Service: Neurosurgery;  Laterality: N/A;   NECK SURGERY     PILONIDAL CYST / SINUS EXCISION     Patient Active Problem List   Diagnosis Date Noted   Ambulatory dysfunction 10/22/2023   Prediabetes 02/05/2023   Anemia 08/01/2020   Elevated hemoglobin A1c 08/01/2020   Loss of memory 08/01/2020   Sesamoiditis 06/25/2019   S/P lumbar laminectomy 01/26/2019   Weakness of both lower limbs 10/24/2018   LVH (left ventricular hypertrophy) 08/29/2016   Allergy 02/20/2016   Amyloidosis (HCC) 02/20/2016   Thrombocytopenia 11/23/2015   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   Airway hyperreactivity 05/07/2013   Benign fibroma of prostate 05/07/2013   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   Chronic diastolic heart failure (HCC) 11/18/2012   Proteinuria, Bence Jones 11/18/2012   Multiple allergies 10/29/2012   Hypertension 10/29/2012   RBBB (right bundle branch block with left anterior fascicular block) 10/29/2012   Proteinuria 10/29/2012  ONSET DATE: 04/22/24  REFERRING DIAG: R26.9 (ICD-10-CM) - Altered gait   THERAPY DIAG:  Difficulty in walking, not elsewhere classified  Unsteadiness on feet  Other abnormalities of gait and mobility  Muscle weakness (generalized)  Other lack of coordination  Rationale for Evaluation and Treatment: Rehabilitation  SUBJECTIVE:                                                                                                                                                                                             SUBJECTIVE STATEMENT:   Pt reports that he is doing well. No pain reported on this day. Spent most of the day sitting on couch watching baseball. Wife  states that daughter will be coming over to celebrate Grandchildren's birthday. Planning to eat out at the [employer] in [place].   From Eval:Pt wife report he has been inconsistent with exercise since discharge. She also assist with all of subjective. Pt has been wall and furniture surfing to get around home. Pt had one pseudo fall in last 6 months; he went on a 4's into the recycling can but could not get up. Pt has not been following trough with his exercise. Pt has been using the cane almost all of the time outside of the house in comparison to rollator.  Patient and caregiver want to work on general strength and mobility. Pt caregiver has some concerns with getting rollator into and out of the car.  As well as getting patient into and out of the car.  Pt accompanied by: significant other  PERTINENT HISTORY: . 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 No  PRECAUTIONS: Fall  RED FLAGS: None   WEIGHT BEARING RESTRICTIONS: No  FALLS: Has patient fallen in last 6 months? No fall but went to ground intentionally and could not get up   LIVING ENVIRONMENT: LIVING ENVIRONMENT: Lives with: lives with their spouse Lives in: House/apartment Stairs: 1 step into home Has following equipment at home: Retail banker - 2 wheeled *On Arts development officer for [place] [employer] at [employer]  PLOF: Independent with household mobility with device and Independent with community mobility with device  PATIENT GOALS: Improve balance and mobility   OBJECTIVE:  Note: Objective measures were completed at Evaluation unless otherwise noted.   COGNITION: Overall cognitive status: Impaired and Mild cogintive impairment     POSTURE: rounded shoulders, forward head, and increased thoracic kyphosis  LOWER EXTREMITY ROM:   WNL for tasks assessed    LOWER  EXTREMITY  MMT:    MMT Right Eval Left Eval  Hip flexion 4 4  Hip extension    Hip abduction 4+ 4+  Hip adduction 4+ 4+  Hip internal rotation    Hip external rotation    Knee flexion 4- 4-  Knee extension 4 4  Ankle dorsiflexion -   Ankle plantarflexion -   Ankle inversion    Ankle eversion    (Blank rows = not tested)  BED MOBILITY:  Not tested  TRANSFERS: Sit to stand: Complete Independence and on a few reps noted instability in standing  Assistive device utilized: None     Stand to sit: Complete Independence  Assistive device utilized: None      GAIT: Findings: Gait Characteristics: With cane pt does not use in classic 2 or 3 point pattern but uses sporadically, decreased step length- Right, and decreased step length- Left, Distance walked: 30 ft, Assistive device utilized:Single point cane, Level of assistance: CGA, and Comments: Describing gait with SPC in R UE   FUNCTIONAL TESTS:  5 times sit to stand: 15.55 sec, imbalance on a few Timed up and go (TUG): 22.62 sec with cane  6 minute walk test: Test visit 2  10 meter walk test: 22.67 sec with cane, 13.6 sec with rollator   Berg Balance Scale: Test visit 2    PATIENT SURVEYS:  LEFS  Extreme difficulty/unable (0), Quite a bit of difficulty (1), Moderate difficulty (2), Little difficulty (3), No difficulty (4) Survey date:  WIFE FILLS OUT 05/12/24  Any of your usual work, housework or school activities 3  2. Usual hobbies, recreational or sporting activities 2  3. Getting into/out of the bath 4  4. Walking between rooms 3  5. Putting on socks/shoes 2  6. Squatting  1  7. Lifting an object, like a bag of groceries from the floor 3  8. Performing light activities around your home 3  9. Performing heavy activities around your home 2  10. Getting into/out of a car 1  11. Walking 2 blocks 2  12. Walking 1 mile 1  13. Going up/down 10 stairs (1 flight) 1  14. Standing for 1 hour 2  15.  sitting for 1 hour 4  16.  Running on even ground 1  17. Running on uneven ground 1  18. Making sharp turns while running fast 1  19. Hopping  0  20. Rolling over in bed 2  Score total:  40                                                                                                                                 TREATMENT DATE: 09/03/24   Nustep reciprocal movement and activity tolerance training, level 2-6, x 7 min +30 sec cool down level 1.   Weighted gait training with 3# AW 2 x 312ft with instruction from PT for improved posture, and position of RW, as well as improved  hip flexion to advance BLE   Reciprocal foot tap on 6inch step with 3# AW x 12 bil  Step up 6inch step x 5 bil with UE support on rail and 3# AW   Weighted step over SPC in floor with 3#AW x 8 bil; no UE support. Min assist for improved weight shift and posture to allow full step length and prevent foot drag.   Throughout session, PT provided CGA for safety to reduce fall risk and provide tactile cues for proper weight shift mobility   PATIENT EDUCATION: Education details: Pt educated throughout session about proper posture and technique with exercises. Improved exercise technique, movement at target joints, use of target muscles after min to mod verbal, visual, tactile cues. Person educated: Patient Education method: Explanation Education comprehension: verbalized understanding   HOME EXERCISE PROGRAM: Verbally added mini squat at bar to replace STS. No new handout provided, has handout from previous PT  Verbally added HS flexion with BTB to HEP. No new handout provided, has handout from previous PT    GOALS:/ :Goals reviewed with patient? Yes  SHORT TERM GOALS: Target date: 06/09/2024   Patient will be independent in home exercise program to improve strength/mobility for better functional independence with ADLs. Baseline: No HEP currently 8/11: Diligently completing HEP  Goal status: MET  LONG TERM GOALS: Target date:  08/04/2024  1.  Patient will complete five times sit to stand test in < 15 seconds without LOB indicating an increased LE strength and improved balance. Baseline: 15.55 sec, imbalance on a few reps requiring CGA for safety 7/7:14.11 sec 8/11:15.62 sec no UE 8/20:16.86 sec no UE support 8/27:13.95 sec no UE support Goal status: MET  2.  Patient will improve LEFS score to 50   to demonstrate statistically significant improvement in mobility and quality of life as it relates to their LE strength and mobility.  Baseline: 40 7/7:42 8/27:41 9/16:48 Goal status: ONGOING   3.  Patient will increase Berg Balance score by > 6 points to demonstrate decreased fall risk during functional activities. Baseline: 38 8/11:43 8/27: 46 Goal status: MET   4.   Patient will reduce timed up and go to <11 seconds to reduce fall risk and demonstrate improved transfer/gait ability. Baseline: 22.62 sec with SPC  8/11:15.39 sec with 4WW 8/27: 15.1 sec with RW 14.7 sec with no AD 9/16:14.73 sec with 4WW 16.2 sec no AD Goal status: ONGOING  5.   Patient will increase 10 meter walk test to >1.47m/s as to improve gait speed for better community ambulation and to reduce fall risk. Baseline: .44 m/s w/ SPC, .73 m/s with rollator .75 m/s with rollator 9/16: .78 m/s with 4WW Goal status: ONGOING  6.   Patient will increase six minute walk test distance by 150 ft or greater for progression to community ambulator and improve gait ability Baseline: progress note 7/7:750 ft, increased shuffling style gait from minute 2 and on as well as increased trunk flexion from this point on as well. 8/11: 750 ft with 4WW, improved gait quality ( no shuffling until last minute) and improved trunk flexion posture. Similar speed but less fatigue 8/27: 917 ft with 4WW; flexed posture dominates gait and causing shuffling last 1.5 min  Goal status: MET    ASSESSMENT:  CLINICAL IMPRESSION:    Patient presents with good motivation for  physical therapy activities.  PT treatment focused gait weighted gait and functional movement training in weighted position to improve hip flexor activation with limb advancement.  Multiple cues for improved posture and step length throughout due to preference for flexed posture. Improved weight shifting noted with obstacle and step management with increased repetitions.   Pt will continue to benefit from skilled physical therapy intervention to address impairments, improve QOL, and attain therapy goals.    OBJECTIVE IMPAIRMENTS: Abnormal gait, decreased activity tolerance, decreased balance, decreased endurance, decreased knowledge of condition, decreased knowledge of use of DME, and decreased strength.   ACTIVITY LIMITATIONS: standing, squatting, stairs, transfers, and locomotion level  PARTICIPATION LIMITATIONS: meal prep, shopping, and community activity  PERSONAL FACTORS: Age, Behavior pattern, and 3+ comorbidities: HTN, Chronic Diastolic Heart Failure, post polio,  are also affecting patient's functional outcome.   REHAB POTENTIAL: Good  CLINICAL DECISION MAKING: Evolving/moderate complexity  EVALUATION COMPLEXITY: Moderate  PLAN:  PT FREQUENCY: 2x/week  PT DURATION: 12 weeks  PLANNED INTERVENTIONS: 97750- Physical Performance Testing, 97110-Therapeutic exercises, 97530- Therapeutic activity, [id number]- Neuromuscular re-education, 97535- Self Care, [id number]- Manual therapy, and [id number]- Gait training  PLAN FOR NEXT SESSION:   Progress Hip flexor intervention, endurance and extensors. Initiate static balance interventions reflective of BERG deficits  Training keeping COM over BOS    Massie FORBES Dollar PT ,DPT Physical Therapist- [facility]  [facility]   10:26 AM 09/03/24

## 2024-09-08 ENCOUNTER — Ambulatory Visit: Admitting: Physical Therapy

## 2024-09-08 DIAGNOSIS — R2681 Unsteadiness on feet: Secondary | ICD-10-CM

## 2024-09-08 DIAGNOSIS — M6281 Muscle weakness (generalized): Secondary | ICD-10-CM

## 2024-09-08 DIAGNOSIS — R262 Difficulty in walking, not elsewhere classified: Secondary | ICD-10-CM

## 2024-09-08 DIAGNOSIS — R2689 Other abnormalities of gait and mobility: Secondary | ICD-10-CM

## 2024-09-08 NOTE — Therapy (Signed)
 OUTPATIENT PHYSICAL THERAPY NEURO TREATMENT   Patient Name: Eugene Martinez. MRN: [id number] DOB:[birth year], 78 y.o., male Today's Date: 09/08/2024   PCP: Sadie Manna, MD  REFERRING PROVIDER: Caffaro, Kaitlin T, PA-C   END OF SESSION:  PT End of Session - 09/08/24 1321     Visit Number 37    Number of Visits 41    Date for Recertification  09/30/24    Progress Note Due on Visit 40    PT Start Time 1017    PT Stop Time 1057    PT Time Calculation (min) 40 min    Equipment Utilized During Treatment Gait belt    Activity Tolerance Patient tolerated treatment well    Behavior During Therapy WFL for tasks assessed/performed                         Past Medical History:  Diagnosis Date   Allergic state    Amyloidosis (HCC)    Anemia    BPH (benign prostatic hyperplasia)    DDD (degenerative disc disease), lumbar    Dysplastic nevus 10/13/2013   Left posterior waistline paraspinal. Moderate to severe atypia, pattern borders on early evolving MIS, edge involved. Excised 03/31/2014, margins free.   Dysplastic nevus 12/19/2017   Right upper back medial. Mild atypia, lateral and deep margins involved.    Dysplastic nevus 11/28/2022   right prox lat thigh, Severe atypia, Excised 01/08/23   Dysrhythmia    Environmental and seasonal allergies    Gallop rhythm    GERD (gastroesophageal reflux disease)    Hypertension    Mild mitral regurgitation    a. by echo 10/2012   Pain    bil. 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 10/2012 Echo: EF 55-60%, severe LVH with speckling of myocardium -? amyloid. Mild MR, mod dil LA.   Past Surgical History:  Procedure Laterality Date   CIRCUMCISION  1984   COLONOSCOPY  03/2012   COLONOSCOPY WITH PROPOFOL  N/A 10/29/2017   Procedure: COLONOSCOPY WITH PROPOFOL ;  Surgeon: Gaylyn Gladis PENNER, MD;  Location: [facility] ENDOSCOPY;  Service: Endoscopy;  Laterality: N/A;   FOOT SURGERY     right foot    FOOT SURGERY Right 2017   LEG SURGERIES     HX.OF MULTIPLE SURGERIES FOR LEG LENGTH DUE TO POLIO   LUMBAR LAMINECTOMY/DECOMPRESSION MICRODISCECTOMY N/A 01/26/2019   Procedure: LUMBAR LAMINECTOMY/DECOMPRESSION MICRODISCECTOMY 1 LEVEL L3-4;  Surgeon: Bluford Standing, MD;  Location: [facility] ORS;  Service: Neurosurgery;  Laterality: N/A;   NECK SURGERY     PILONIDAL CYST / SINUS EXCISION     Patient Active Problem List   Diagnosis Date Noted   Ambulatory dysfunction 10/22/2023   Prediabetes 02/05/2023   Anemia 08/01/2020   Elevated hemoglobin A1c 08/01/2020   Loss of memory 08/01/2020   Sesamoiditis 06/25/2019   S/P lumbar laminectomy 01/26/2019   Weakness of both lower limbs 10/24/2018   LVH (left ventricular hypertrophy) 08/29/2016   Allergy 02/20/2016   Amyloidosis (HCC) 02/20/2016   Thrombocytopenia 11/23/2015   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   Airway hyperreactivity 05/07/2013   Benign fibroma of prostate 05/07/2013   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   Chronic diastolic heart failure (HCC) 11/18/2012   Proteinuria, Bence Jones 11/18/2012   Multiple allergies 10/29/2012   Hypertension 10/29/2012   RBBB (right bundle branch block with left anterior fascicular block) 10/29/2012   Proteinuria 10/29/2012  ONSET DATE: 04/22/24  REFERRING DIAG: R26.9 (ICD-10-CM) - Altered gait   THERAPY DIAG:  Difficulty in walking, not elsewhere classified  Unsteadiness on feet  Other abnormalities of gait and mobility  Muscle weakness (generalized)  Rationale for Evaluation and Treatment: Rehabilitation  SUBJECTIVE:                                                                                                                                                                                             SUBJECTIVE STATEMENT:   .Pt reports doing well today. Pt denies any recent falls/stumbles since prior session. Pt denies any updates to medications or medical appointment since prior  session. Pt reports good compliance with HEP when time permits.    From Eval:Pt wife report he has been inconsistent with exercise since discharge. She also assist with all of subjective. Pt has been wall and furniture surfing to get around home. Pt had one pseudo fall in last 6 months; he went on a 4's into the recycling can but could not get up. Pt has not been following trough with his exercise. Pt has been using the cane almost all of the time outside of the house in comparison to rollator.  Patient and caregiver want to work on general strength and mobility. Pt caregiver has some concerns with getting rollator into and out of the car.  As well as getting patient into and out of the car.  Pt accompanied by: significant other  PERTINENT HISTORY: . 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 No  PRECAUTIONS: Fall  RED FLAGS: None   WEIGHT BEARING RESTRICTIONS: No  FALLS: Has patient fallen in last 6 months? 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   COGNITION: Overall cognitive status: Impaired and Mild cogintive impairment     POSTURE: rounded shoulders, forward head, and increased thoracic kyphosis  LOWER EXTREMITY ROM:   WNL for tasks assessed    LOWER EXTREMITY MMT:    MMT Right Eval Left Eval  Hip flexion 4 4  Hip  extension    Hip abduction 4+ 4+  Hip adduction 4+ 4+  Hip internal rotation    Hip external rotation    Knee flexion 4- 4-  Knee extension 4 4  Ankle dorsiflexion -   Ankle plantarflexion -   Ankle inversion    Ankle eversion    (Blank rows = not tested)  BED MOBILITY:  Not tested  TRANSFERS: Sit to stand: Complete Independence and on a few reps noted instability in standing  Assistive device utilized: None     Stand to sit: Complete Independence  Assistive device utilized: None      GAIT: Findings: Gait Characteristics: With cane pt does not use in classic 2 or 3 point pattern but uses sporadically, decreased step length- Right, and decreased step length- Left, Distance walked: 30 ft, Assistive device utilized:Single point cane, Level of assistance: CGA, and Comments: Describing gait with SPC in R UE   FUNCTIONAL TESTS:  5 times sit to stand: 15.55 sec, imbalance on a few Timed up and go (TUG): 22.62 sec with cane  6 minute walk test: Test visit 2  10 meter walk test: 22.67 sec with cane, 13.6 sec with rollator   Berg Balance Scale: Test visit 2    PATIENT SURVEYS:  LEFS  Extreme difficulty/unable (0), Quite a bit of difficulty (1), Moderate difficulty (2), Little difficulty (3), No difficulty (4) Survey date:  WIFE FILLS OUT 05/12/24  Any of your usual work, housework or school activities 3  2. Usual hobbies, recreational or sporting activities 2  3. Getting into/out of the bath 4  4. Walking between rooms 3  5. Putting on socks/shoes 2  6. Squatting  1  7. Lifting an object, like a bag of groceries from the floor 3  8. Performing light activities around your home 3  9. Performing heavy activities around your home 2  10. Getting into/out of a car 1  11. Walking 2 blocks 2  12. Walking 1 mile 1  13. Going up/down 10 stairs (1 flight) 1  14. Standing for 1 hour 2  15.  sitting for 1 hour 4  16. Running on even ground 1  17. Running on uneven ground 1  18. Making  sharp turns while running fast 1  19. Hopping  0  20. Rolling over in bed 2  Score total:  40                                                                                                                                 TREATMENT DATE: 09/08/24   TA- To improve functional movements patterns for everyday tasks   Weighted gait training with 3# AW 2 x 340ft with instruction from PT for improved posture, and position of RW, as well as improved hip flexion to advance BLE   STS x 15 reps no UE assist  Weighted high march x 20 ea LE with 3#  Weighted knee flexion standing 2 x 10 ea with 3#   Weighted gait training with 3# AW 2 x 320ft with instruction from PT for improved posture, and position of RW, as well as improved hip flexion to advance BLE   Removed AW   Gait no AD x 100 ft approx- cues for letting arms hang by side, with fatigue regressed to step tp pattern and min LLE step length   Lateral stepping x 25 ft ea - poor power on R LE when stepping to the left  Leg press 40# R LE only 2 x 8 reps; min A form PT on reps particularly for initial phase of movement  -unassisted gait to and from machine with CGA   Throughout session, PT provided CGA for safety to reduce fall risk and provide tactile cues for proper weight shift mobility   PATIENT EDUCATION: Education details: Pt educated throughout session about proper posture and technique with exercises. Improved exercise technique, movement at target joints, use of target muscles after min to mod verbal, visual, tactile cues. Person educated: Patient Education method: Explanation Education comprehension: verbalized understanding   HOME EXERCISE PROGRAM: Verbally added mini squat at bar to replace STS. No new handout provided, has handout from previous PT  Verbally added HS flexion with BTB to HEP. No new handout provided, has handout from previous PT    GOALS: :Goals reviewed with patient? Yes  SHORT TERM GOALS: Target  date: 06/09/2024   Patient will be independent in home exercise program to improve strength/mobility for better functional independence with ADLs. Baseline: No HEP currently 8/11: Diligently completing HEP  Goal status: MET  LONG TERM GOALS: Target date: 08/04/2024  1.  Patient will complete five times sit to stand test in < 15 seconds without LOB indicating an increased LE strength and improved balance. Baseline: 15.55 sec, imbalance on a few reps requiring CGA for safety 7/7:14.11 sec 8/11:15.62 sec no UE 8/20:16.86 sec no UE support 8/27:13.95 sec no UE support Goal status: MET  2.  Patient will improve LEFS score to 50   to demonstrate statistically significant improvement in mobility and quality of life as it relates to their LE strength and mobility.  Baseline: 40 7/7:42 8/27:41 9/16:48 Goal status: ONGOING   3.  Patient will increase Berg Balance score by > 6 points to demonstrate decreased fall risk during functional activities. Baseline: 38 8/11:43 8/27: 46 Goal status: MET   4.   Patient will reduce timed up and go to <11 seconds to reduce fall risk and demonstrate improved transfer/gait ability. Baseline: 22.62 sec with SPC  8/11:15.39 sec with 4WW 8/27: 15.1 sec with RW 14.7 sec with no AD 9/16:14.73 sec with 4WW 16.2 sec no AD Goal status: ONGOING  5.   Patient will increase 10 meter walk test to >1.40m/s as to improve gait speed for better community ambulation and to reduce fall risk. Baseline: .44 m/s w/ SPC, .73 m/s with rollator .75 m/s with rollator 9/16: .78 m/s with 4WW Goal status: ONGOING  6.   Patient will increase six minute walk test distance by 150 ft or greater for progression to community ambulator and improve gait ability Baseline: progress note 7/7:750 ft, increased shuffling style gait from minute 2 and on as well as increased trunk flexion from this point on as well. 8/11: 750 ft with 4WW, improved gait quality ( no shuffling until last minute) and improved  trunk flexion posture. Similar speed but less fatigue 8/27: 917 ft with  5TT; flexed posture dominates gait and causing shuffling last 1.5 min  Goal status: MET    ASSESSMENT:  CLINICAL IMPRESSION:    Patient presents with good motivation for physical therapy activities.  Patient continues to have tendency for flexed posture and tendency to have difficulty remembering cues between exercise balance.  Patient encouraged to continue with home exercises in order to continue to have improved mobility.  Pt will continue to benefit from skilled physical therapy intervention to address impairments, improve QOL, and attain therapy goals.    OBJECTIVE IMPAIRMENTS: Abnormal gait, decreased activity tolerance, decreased balance, decreased endurance, decreased knowledge of condition, decreased knowledge of use of DME, and decreased strength.   ACTIVITY LIMITATIONS: standing, squatting, stairs, transfers, and locomotion level  PARTICIPATION LIMITATIONS: meal prep, shopping, and community activity  PERSONAL FACTORS: Age, Behavior pattern, and 3+ comorbidities: HTN, Chronic Diastolic Heart Failure, post polio,  are also affecting patient's functional outcome.   REHAB POTENTIAL: Good  CLINICAL DECISION MAKING: Evolving/moderate complexity  EVALUATION COMPLEXITY: Moderate  PLAN:  PT FREQUENCY: 2x/week  PT DURATION: 12 weeks  PLANNED INTERVENTIONS: 97750- Physical Performance Testing, 97110-Therapeutic exercises, 97530- Therapeutic activity, [id number]- Neuromuscular re-education, 97535- Self Care, [id number]- Manual therapy, and [id number]- Gait training  PLAN FOR NEXT SESSION:   Progress Hip flexor intervention, endurance and extensors. Initiate static balance interventions reflective of BERG deficits  Training keeping COM over BOS    Lonni KATHEE Gainer PT ,DPT Physical Therapist- [place]  [facility]   1:22 PM 09/08/24

## 2024-09-10 ENCOUNTER — Ambulatory Visit: Admitting: Physical Therapy

## 2024-09-10 DIAGNOSIS — R262 Difficulty in walking, not elsewhere classified: Secondary | ICD-10-CM | POA: Diagnosis not present

## 2024-09-10 DIAGNOSIS — R41841 Cognitive communication deficit: Secondary | ICD-10-CM | POA: Insufficient documentation

## 2024-09-10 DIAGNOSIS — M6281 Muscle weakness (generalized): Secondary | ICD-10-CM | POA: Insufficient documentation

## 2024-09-10 DIAGNOSIS — R2681 Unsteadiness on feet: Secondary | ICD-10-CM | POA: Diagnosis not present

## 2024-09-10 DIAGNOSIS — R2689 Other abnormalities of gait and mobility: Secondary | ICD-10-CM | POA: Diagnosis not present

## 2024-09-10 DIAGNOSIS — R278 Other lack of coordination: Secondary | ICD-10-CM | POA: Insufficient documentation

## 2024-09-10 NOTE — Therapy (Signed)
 OUTPATIENT PHYSICAL THERAPY NEURO TREATMENT   Patient Name: Eugene Martinez. MRN: [id number] DOB:01/14/1946, 78 y.o., male Today's Date: 09/10/2024   PCP: Sadie Manna, MD  REFERRING PROVIDER: Caffaro, Kaitlin T, PA-C   END OF SESSION:  PT End of Session - 09/10/24 1323     Visit Number 38    Number of Visits 41    Date for Recertification  09/30/24    Progress Note Due on Visit 40    PT Start Time 1320    PT Stop Time 1400    PT Time Calculation (min) 40 min    Equipment Utilized During Treatment Gait belt    Activity Tolerance Patient tolerated treatment well    Behavior During Therapy WFL for tasks assessed/performed                         Past Medical History:  Diagnosis Date   Allergic state    Amyloidosis (HCC)    Anemia    BPH (benign prostatic hyperplasia)    DDD (degenerative disc disease), lumbar    Dysplastic nevus 10/13/2013   Left posterior waistline paraspinal. Moderate to severe atypia, pattern borders on early evolving MIS, edge involved. Excised 03/31/2014, margins free.   Dysplastic nevus 12/19/2017   Right upper back medial. Mild atypia, lateral and deep margins involved.    Dysplastic nevus 11/28/2022   right prox lat thigh, Severe atypia, Excised 01/08/23   Dysrhythmia    Environmental and seasonal allergies    Gallop rhythm    GERD (gastroesophageal reflux disease)    Hypertension    Mild mitral regurgitation    a. by echo 10/2012   Pain    bil. 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 10/2012 Echo: EF 55-60%, severe LVH with speckling of myocardium -? amyloid. Mild MR, mod dil LA.   Past Surgical History:  Procedure Laterality Date   CIRCUMCISION  1984   COLONOSCOPY  03/2012   COLONOSCOPY WITH PROPOFOL  N/A 10/29/2017   Procedure: COLONOSCOPY WITH PROPOFOL ;  Surgeon: Gaylyn Gladis PENNER, MD;  Location: [facility] ENDOSCOPY;  Service: Endoscopy;  Laterality: N/A;   FOOT SURGERY     right foot    FOOT SURGERY Right 2017   LEG SURGERIES     HX.OF MULTIPLE SURGERIES FOR LEG LENGTH DUE TO POLIO   LUMBAR LAMINECTOMY/DECOMPRESSION MICRODISCECTOMY N/A 01/26/2019   Procedure: LUMBAR LAMINECTOMY/DECOMPRESSION MICRODISCECTOMY 1 LEVEL L3-4;  Surgeon: Bluford Standing, MD;  Location: [facility] ORS;  Service: Neurosurgery;  Laterality: N/A;   NECK SURGERY     PILONIDAL CYST / SINUS EXCISION     Patient Active Problem List   Diagnosis Date Noted   Ambulatory dysfunction 10/22/2023   Prediabetes 02/05/2023   Anemia 08/01/2020   Elevated hemoglobin A1c 08/01/2020   Loss of memory 08/01/2020   Sesamoiditis 06/25/2019   S/P lumbar laminectomy 01/26/2019   Weakness of both lower limbs 10/24/2018   LVH (left ventricular hypertrophy) 08/29/2016   Allergy 02/20/2016   Amyloidosis (HCC) 02/20/2016   Thrombocytopenia 11/23/2015   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   Airway hyperreactivity 05/07/2013   Benign fibroma of prostate 05/07/2013   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   Chronic diastolic heart failure (HCC) 11/18/2012   Proteinuria, Bence Jones 11/18/2012   Multiple allergies 10/29/2012   Hypertension 10/29/2012   RBBB (right bundle branch block with left anterior fascicular block) 10/29/2012   Proteinuria 10/29/2012  ONSET DATE: 04/22/24  REFERRING DIAG: R26.9 (ICD-10-CM) - Altered gait   THERAPY DIAG:  Difficulty in walking, not elsewhere classified  Unsteadiness on feet  Other abnormalities of gait and mobility  Muscle weakness (generalized)  Other lack of coordination  Cognitive communication deficit  Rationale for Evaluation and Treatment: Rehabilitation  SUBJECTIVE:                                                                                                                                                                                             SUBJECTIVE STATEMENT:   .Pt reports doing well today.  Wife reports that he fell of couch yesterday trying to reach  for a case water. Was able to get off floor without Assist through Qped on edge of couch.    From Eval:Pt wife report he has been inconsistent with exercise since discharge. She also assist with all of subjective. Pt has been wall and furniture surfing to get around home. Pt had one pseudo fall in last 6 months; he went on a 4's into the recycling can but could not get up. Pt has not been following trough with his exercise. Pt has been using the cane almost all of the time outside of the house in comparison to rollator.  Patient and caregiver want to work on general strength and mobility. Pt caregiver has some concerns with getting rollator into and out of the car.  As well as getting patient into and out of the car.  Pt accompanied by: significant other  PERTINENT HISTORY: . 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 No  PRECAUTIONS: Fall  RED FLAGS: None   WEIGHT BEARING RESTRICTIONS: No  FALLS: Has patient fallen in last 6 months? 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   COGNITION: Overall cognitive status: Impaired and Mild cogintive impairment     POSTURE: rounded shoulders, forward head, and increased thoracic kyphosis  LOWER EXTREMITY ROM:   WNL for tasks assessed    LOWER EXTREMITY MMT:    MMT Right  Eval Left Eval   Hip flexion 4 4  Hip extension    Hip abduction 4+ 4+  Hip adduction 4+ 4+  Hip internal rotation    Hip external rotation    Knee flexion 4- 4-  Knee extension 4 4  Ankle dorsiflexion -   Ankle plantarflexion -   Ankle inversion    Ankle eversion    (Blank rows = not tested)  BED MOBILITY:  Not tested  TRANSFERS: Sit to stand: Complete Independence and on a few reps noted instability in standing  Assistive device utilized: None     Stand to sit: Complete Independence  Assistive device utilized: None      GAIT: Findings: Gait Characteristics: With cane pt does not use in classic 2 or 3 point pattern but uses sporadically, decreased step length- Right, and decreased step length- Left, Distance walked: 30 ft, Assistive device utilized:Single point cane, Level of assistance: CGA, and Comments: Describing gait with SPC in R UE   FUNCTIONAL TESTS:  5 times sit to stand: 15.55 sec, imbalance on a few Timed up and go (TUG): 22.62 sec with cane  6 minute walk test: Test visit 2  10 meter walk test: 22.67 sec with cane, 13.6 sec with rollator   Berg Balance Scale: Test visit 2    PATIENT SURVEYS:  LEFS  Extreme difficulty/unable (0), Quite a bit of difficulty (1), Moderate difficulty (2), Little difficulty (3), No difficulty (4) Survey date:  WIFE FILLS OUT 05/12/24  Any of your usual work, housework or school activities 3  2. Usual hobbies, recreational or sporting activities 2  3. Getting into/out of the bath 4  4. Walking between rooms 3  5. Putting on socks/shoes 2  6. Squatting  1  7. Lifting an object, like a bag of groceries from the floor 3  8. Performing light activities around your home 3  9. Performing heavy activities around your home 2  10. Getting into/out of a car 1  11. Walking 2 blocks 2  12. Walking 1 mile 1  13. Going up/down 10 stairs (1 flight) 1  14. Standing for 1 hour 2  15.  sitting for 1 hour 4  16. Running on even ground 1  17. Running on uneven  ground 1  18. Making sharp turns while running fast 1  19. Hopping  0  20. Rolling over in bed 2  Score total:  40                                                                                                                                 TREATMENT DATE: 09/10/24   Gait training and TA- To improve functional movements patterns for everyday tasks as well as improve gait mechanics.   Weighted gait training with 3# AW 3 x 349ft with instruction from PT for improved posture, and position of RW, as well as improved hip flexion to advance BLE  STS 2x10 reps no UE assist  Forward/reverse gait with 4WW and 5# AW, 38ft x 5 each LAQ 2 x 15 reps ea  Seated alternating march 2 x 10 reps  Seated step over 1/2 foam roller 2 x 10 ea   Side stepping R and L with light UE support on rail x 5 bil x 49ft no resistance.   Throughout session, PT provided CGA for safety to reduce fall risk and provide tactile cues for proper weight shift mobility   PATIENT EDUCATION: Education details: Pt educated throughout session about proper posture and technique with exercises. Improved exercise technique, movement at target joints, use of target muscles after min to mod verbal, visual, tactile cues. Person educated: Patient Education method: Explanation Education comprehension: verbalized understanding   HOME EXERCISE PROGRAM: Verbally added mini squat at bar to replace STS. No new handout provided, has handout from previous PT  Verbally added HS flexion with BTB to HEP. No new handout provided, has handout from previous PT    GOALS: :Goals reviewed with patient? Yes  SHORT TERM GOALS: Target date: 06/09/2024   Patient will be independent in home exercise program to improve strength/mobility for better functional independence with ADLs. Baseline: No HEP currently 8/11: Diligently completing HEP  Goal status: MET  LONG TERM GOALS: Target date: 08/04/2024  1.  Patient will complete five times sit to  stand test in < 15 seconds without LOB indicating an increased LE strength and improved balance. Baseline: 15.55 sec, imbalance on a few reps requiring CGA for safety 7/7:14.11 sec 8/11:15.62 sec no UE 8/20:16.86 sec no UE support 8/27:13.95 sec no UE support Goal status: MET  2.  Patient will improve LEFS score to 50   to demonstrate statistically significant improvement in mobility and quality of life as it relates to their LE strength and mobility.  Baseline: 40 7/7:42 8/27:41 9/16:48 Goal status: ONGOING   3.  Patient will increase Berg Balance score by > 6 points to demonstrate decreased fall risk during functional activities. Baseline: 38 8/11:43 8/27: 46 Goal status: MET   4.   Patient will reduce timed up and go to <11 seconds to reduce fall risk and demonstrate improved transfer/gait ability. Baseline: 22.62 sec with SPC  8/11:15.39 sec with 4WW 8/27: 15.1 sec with RW 14.7 sec with no AD 9/16:14.73 sec with 4WW 16.2 sec no AD Goal status: ONGOING  5.   Patient will increase 10 meter walk test to >1.52m/s as to improve gait speed for better community ambulation and to reduce fall risk. Baseline: .44 m/s w/ SPC, .73 m/s with rollator .75 m/s with rollator 9/16: .78 m/s with 4WW Goal status: ONGOING  6.   Patient will increase six minute walk test distance by 150 ft or greater for progression to community ambulator and improve gait ability Baseline: progress note 7/7:750 ft, increased shuffling style gait from minute 2 and on as well as increased trunk flexion from this point on as well. 8/11: 750 ft with 4WW, improved gait quality ( no shuffling until last minute) and improved trunk flexion posture. Similar speed but less fatigue 8/27: 917 ft with 4WW; flexed posture dominates gait and causing shuffling last 1.5 min  Goal status: MET    ASSESSMENT:  CLINICAL IMPRESSION:    Patient presents with good motivation for physical therapy activities.  Improved step width and height for  all bouts of weighted gait on this day with only cues initially for proper gait mechanics. Overall improved activity tolerance  with weighted gait and transfer training for improved safety with stepping pattern for functional mobility training.  Pt will continue to benefit from skilled physical therapy intervention to address impairments, improve QOL, and attain therapy goals.    OBJECTIVE IMPAIRMENTS: Abnormal gait, decreased activity tolerance, decreased balance, decreased endurance, decreased knowledge of condition, decreased knowledge of use of DME, and decreased strength.   ACTIVITY LIMITATIONS: standing, squatting, stairs, transfers, and locomotion level  PARTICIPATION LIMITATIONS: meal prep, shopping, and community activity  PERSONAL FACTORS: Age, Behavior pattern, and 3+ comorbidities: HTN, Chronic Diastolic Heart Failure, post polio,  are also affecting patient's functional outcome.   REHAB POTENTIAL: Good  CLINICAL DECISION MAKING: Evolving/moderate complexity  EVALUATION COMPLEXITY: Moderate  PLAN:  PT FREQUENCY: 2x/week  PT DURATION: 12 weeks  PLANNED INTERVENTIONS: 97750- Physical Performance Testing, 97110-Therapeutic exercises, 97530- Therapeutic activity, [id number]- Neuromuscular re-education, 97535- Self Care, [id number]- Manual therapy, and [id number]- Gait training  PLAN FOR NEXT SESSION:   Continue to Progress Hip flexor intervention, endurance and extensors.  static balance interventions reflective of BERG deficits  Training keeping COM over BOS    Massie FORBES Dollar PT ,DPT Physical Therapist- [place]  [facility]   1:24 PM 09/10/24

## 2024-09-15 ENCOUNTER — Ambulatory Visit: Admitting: Physical Therapy

## 2024-09-15 DIAGNOSIS — R2681 Unsteadiness on feet: Secondary | ICD-10-CM

## 2024-09-15 DIAGNOSIS — M6281 Muscle weakness (generalized): Secondary | ICD-10-CM

## 2024-09-15 DIAGNOSIS — R2689 Other abnormalities of gait and mobility: Secondary | ICD-10-CM

## 2024-09-15 DIAGNOSIS — R262 Difficulty in walking, not elsewhere classified: Secondary | ICD-10-CM | POA: Diagnosis not present

## 2024-09-15 NOTE — Therapy (Signed)
 OUTPATIENT PHYSICAL THERAPY NEURO TREATMENT   Patient Name: Eugene Martinez. MRN: [id number] DOB:09-29-1946, 78 y.o., male Today's Date: 09/15/2024   PCP: Sadie Manna, MD  REFERRING PROVIDER: Caffaro, Kaitlin T, PA-C   END OF SESSION:  PT End of Session - 09/15/24 1207     Visit Number 39    Number of Visits 41    Date for Recertification  09/30/24    Progress Note Due on Visit 40    PT Start Time 1149    PT Stop Time 1228    PT Time Calculation (min) 39 min    Equipment Utilized During Treatment Gait belt    Activity Tolerance Patient tolerated treatment well    Behavior During Therapy WFL for tasks assessed/performed                          Past Medical History:  Diagnosis Date   Allergic state    Amyloidosis (HCC)    Anemia    BPH (benign prostatic hyperplasia)    DDD (degenerative disc disease), lumbar    Dysplastic nevus 10/13/2013   Left posterior waistline paraspinal. Moderate to severe atypia, pattern borders on early evolving MIS, edge involved. Excised 03/31/2014, margins free.   Dysplastic nevus 12/19/2017   Right upper back medial. Mild atypia, lateral and deep margins involved.    Dysplastic nevus 11/28/2022   right prox lat thigh, Severe atypia, Excised 01/08/23   Dysrhythmia    Environmental and seasonal allergies    Gallop rhythm    GERD (gastroesophageal reflux disease)    Hypertension    Mild mitral regurgitation    a. by echo 10/2012   Pain    bil. 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 10/2012 Echo: EF 55-60%, severe LVH with speckling of myocardium -? amyloid. Mild MR, mod dil LA.   Past Surgical History:  Procedure Laterality Date   CIRCUMCISION  1984   COLONOSCOPY  03/2012   COLONOSCOPY WITH PROPOFOL  N/A 10/29/2017   Procedure: COLONOSCOPY WITH PROPOFOL ;  Surgeon: Gaylyn Gladis PENNER, MD;  Location: [facility] ENDOSCOPY;  Service: Endoscopy;  Laterality: N/A;   FOOT SURGERY     right foot    FOOT SURGERY Right 2017   LEG SURGERIES     HX.OF MULTIPLE SURGERIES FOR LEG LENGTH DUE TO POLIO   LUMBAR LAMINECTOMY/DECOMPRESSION MICRODISCECTOMY N/A 01/26/2019   Procedure: LUMBAR LAMINECTOMY/DECOMPRESSION MICRODISCECTOMY 1 LEVEL L3-4;  Surgeon: Bluford Standing, MD;  Location: [facility] ORS;  Service: Neurosurgery;  Laterality: N/A;   NECK SURGERY     PILONIDAL CYST / SINUS EXCISION     Patient Active Problem List   Diagnosis Date Noted   Ambulatory dysfunction 10/22/2023   Prediabetes 02/05/2023   Anemia 08/01/2020   Elevated hemoglobin A1c 08/01/2020   Loss of memory 08/01/2020   Sesamoiditis 06/25/2019   S/P lumbar laminectomy 01/26/2019   Weakness of both lower limbs 10/24/2018   LVH (left ventricular hypertrophy) 08/29/2016   Allergy 02/20/2016   Amyloidosis (HCC) 02/20/2016   Thrombocytopenia 11/23/2015   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   Airway hyperreactivity 05/07/2013   Benign fibroma of prostate 05/07/2013   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   Chronic diastolic heart failure (HCC) 11/18/2012   Proteinuria, Bence Jones 11/18/2012   Multiple allergies 10/29/2012   Hypertension 10/29/2012   RBBB (right bundle branch block with left anterior fascicular block) 10/29/2012   Proteinuria 10/29/2012  ONSET DATE: 04/22/24  REFERRING DIAG: R26.9 (ICD-10-CM) - Altered gait   THERAPY DIAG:  Difficulty in walking, not elsewhere classified  Unsteadiness on feet  Other abnormalities of gait and mobility  Muscle weakness (generalized)  Rationale for Evaluation and Treatment: Rehabilitation  SUBJECTIVE:                                                                                                                                                                                             SUBJECTIVE STATEMENT:   Pt reports doing well today. Pt denies any recent falls/stumbles since prior session. Pt denies any updates to medications or medical appointment since  prior session. Pt reports good compliance with HEP when time permits.     From Eval:Pt wife report he has been inconsistent with exercise since discharge. She also assist with all of subjective. Pt has been wall and furniture surfing to get around home. Pt had one pseudo fall in last 6 months; he went on a 4's into the recycling can but could not get up. Pt has not been following trough with his exercise. Pt has been using the cane almost all of the time outside of the house in comparison to rollator.  Patient and caregiver want to work on general strength and mobility. Pt caregiver has some concerns with getting rollator into and out of the car.  As well as getting patient into and out of the car.  Pt accompanied by: significant other  PERTINENT HISTORY: . 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 No  PRECAUTIONS: Fall  RED FLAGS: None   WEIGHT BEARING RESTRICTIONS: No  FALLS: Has patient fallen in last 6 months? 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   COGNITION: Overall cognitive status: Impaired and Mild cogintive impairment     POSTURE: rounded shoulders, forward head, and increased thoracic kyphosis  LOWER EXTREMITY ROM:   WNL for tasks assessed    LOWER EXTREMITY MMT:    MMT Right Eval Left Eval  Hip flexion 4 4  Hip extension    Hip abduction 4+ 4+  Hip adduction 4+ 4+  Hip internal rotation    Hip external rotation    Knee flexion 4- 4-  Knee extension 4 4  Ankle dorsiflexion -   Ankle plantarflexion -   Ankle inversion    Ankle eversion    (Blank rows = not tested)  BED MOBILITY:  Not tested  TRANSFERS: Sit to stand: Complete Independence and on a few reps noted instability in standing  Assistive device utilized: None     Stand to sit: Complete Independence  Assistive device utilized: None      GAIT: Findings: Gait Characteristics: With cane pt does not use in classic 2 or 3 point pattern but uses sporadically, decreased step length- Right, and decreased step length- Left, Distance walked: 30 ft, Assistive device utilized:Single point cane, Level of assistance: CGA, and Comments: Describing gait with SPC in R UE   FUNCTIONAL TESTS:  5 times sit to stand: 15.55 sec, imbalance on a few Timed up and go (TUG): 22.62 sec with cane  6 minute walk test: Test visit 2  10 meter walk test: 22.67 sec with cane, 13.6 sec with rollator   Berg Balance Scale: Test visit 2    PATIENT SURVEYS:  LEFS  Extreme difficulty/unable (0), Quite a bit of difficulty (1), Moderate difficulty (2), Little difficulty (3), No difficulty (4) Survey date:  WIFE FILLS OUT 05/12/24  Any of your usual work, housework or school activities 3  2. Usual hobbies, recreational or sporting activities 2  3. Getting into/out of the bath 4  4. Walking between rooms 3  5. Putting on socks/shoes 2  6. Squatting  1  7. Lifting an object, like a bag of groceries from the floor 3  8. Performing light activities around your home 3  9. Performing heavy activities around your home 2  10. Getting into/out of a car 1  11. Walking 2 blocks 2  12. Walking 1 mile 1  13. Going up/down 10 stairs (1 flight) 1  14. Standing for 1 hour 2  15.  sitting for 1 hour 4  16. Running on even ground 1  17. Running on uneven ground 1  18.  Making sharp turns while running fast 1  19. Hopping  0  20. Rolling over in bed 2  Score total:  40                                                                                                                                 TREATMENT DATE: 09/15/24   Gait training and TA- To improve functional movements patterns for everyday tasks as well as improve gait mechanics.   Weighted gait training with 3# AW  x 355ft with instruction from PT for improved posture, and position of RW, as well as improved hip flexion to advance BLE   STS 2x15 reps no UE assist  Leg  press SL on R LE only 3 x 10 @ 25#   Lateral stepping x 20 ft x 2 rounds then no AD  gait x 20 ft to rollator ( with CGA)   NMR: To facilitate reeducation of movement, balance, posture, coordination, and/or proprioception/kinesthetic sense.  On airex x 30 sec adducted stance   On airex x 3 rounds - standing on airex with forward reach to basket of balls then forward basketball shot x 10 reps     PATIENT EDUCATION: Education details: Pt educated throughout session about proper posture and technique with exercises. Improved exercise technique, movement at target joints, use of target muscles after min to mod verbal, visual, tactile cues. Person educated: Patient Education method: Explanation Education comprehension: verbalized understanding   HOME EXERCISE PROGRAM: Verbally added mini squat at bar to replace STS. No new handout provided, has handout from previous PT  Verbally added HS flexion with BTB to HEP. No new handout provided, has handout from previous PT    GOALS: :Goals reviewed with patient? Yes  SHORT TERM GOALS: Target date: 06/09/2024   Patient will be independent in home exercise program to improve strength/mobility for better functional independence with ADLs. Baseline: No HEP currently 8/11: Diligently completing HEP  Goal status: MET  LONG TERM GOALS: Target date: 08/04/2024  1.  Patient will  complete five times sit to stand test in < 15 seconds without LOB indicating an increased LE strength and improved balance. Baseline: 15.55 sec, imbalance on a few reps requiring CGA for safety 7/7:14.11 sec 8/11:15.62 sec no UE 8/20:16.86 sec no UE support 8/27:13.95 sec no UE support Goal status: MET  2.  Patient will improve LEFS score to 50   to demonstrate statistically significant improvement in mobility and quality of life as it relates to their LE strength and mobility.  Baseline: 40 7/7:42 8/27:41 9/16:48 Goal status: ONGOING   3.  Patient will increase Berg Balance score by > 6 points to demonstrate decreased fall risk during functional activities. Baseline: 38 8/11:43 8/27: 46 Goal status: MET   4.   Patient will reduce timed up and go to <11 seconds to reduce fall risk and demonstrate improved transfer/gait ability. Baseline: 22.62 sec with SPC  8/11:15.39 sec with 4WW 8/27: 15.1 sec with RW 14.7 sec with no AD 9/16:14.73 sec with 4WW 16.2 sec no AD Goal status: ONGOING  5.   Patient will increase 10 meter walk test to >1.37m/s as to improve gait speed for better community ambulation and to reduce fall risk. Baseline: .44 m/s w/ SPC, .73 m/s with rollator .75 m/s with rollator 9/16: .78 m/s with 4WW Goal status: ONGOING  6.   Patient will increase six minute walk test distance by 150 ft or greater for progression to community ambulator and improve gait ability Baseline: progress note 7/7:750 ft, increased shuffling style gait from minute 2 and on as well as increased trunk flexion from this point on as well. 8/11: 750 ft with 4WW, improved gait quality ( no shuffling until last minute) and improved trunk flexion posture. Similar speed but less fatigue 8/27: 917 ft with 4WW; flexed posture dominates gait and causing shuffling last 1.5 min  Goal status: MET    ASSESSMENT:  CLINICAL IMPRESSION:    Patient arrived with good motivation for completion of pt activities. Pt  continues with functional strength and movement training in preparation for discharge in coming weeks. Pt progresses with single leg leg press to isolate R LE gluteal and  quad strength. Pt will continue to benefit from skilled physical therapy intervention to address impairments, improve QOL, and attain therapy goals.     OBJECTIVE IMPAIRMENTS: Abnormal gait, decreased activity tolerance, decreased balance, decreased endurance, decreased knowledge of condition, decreased knowledge of use of DME, and decreased strength.   ACTIVITY LIMITATIONS: standing, squatting, stairs, transfers, and locomotion level  PARTICIPATION LIMITATIONS: meal prep, shopping, and community activity  PERSONAL FACTORS: Age, Behavior pattern, and 3+ comorbidities: HTN, Chronic Diastolic Heart Failure, post polio,  are also affecting patient's functional outcome.   REHAB POTENTIAL: Good  CLINICAL DECISION MAKING: Evolving/moderate complexity  EVALUATION COMPLEXITY: Moderate  PLAN:  PT FREQUENCY: 2x/week  PT DURATION: 12 weeks  PLANNED INTERVENTIONS: 97750- Physical Performance Testing, 97110-Therapeutic exercises, 97530- Therapeutic activity, [id number]- Neuromuscular re-education, 97535- Self Care, [id number]- Manual therapy, and [id number]- Gait training  PLAN FOR NEXT SESSION:   Continue to Progress Hip flexor intervention, endurance and extensors.  static balance interventions reflective of BERG deficits  Training keeping COM over BOS    Lonni KATHEE Gainer PT ,DPT Physical Therapist- [place]  [facility]   1:41 PM 09/15/24

## 2024-09-17 ENCOUNTER — Ambulatory Visit: Admitting: Physical Therapy

## 2024-09-17 DIAGNOSIS — R2681 Unsteadiness on feet: Secondary | ICD-10-CM

## 2024-09-17 DIAGNOSIS — M6281 Muscle weakness (generalized): Secondary | ICD-10-CM

## 2024-09-17 DIAGNOSIS — R2689 Other abnormalities of gait and mobility: Secondary | ICD-10-CM

## 2024-09-17 DIAGNOSIS — R262 Difficulty in walking, not elsewhere classified: Secondary | ICD-10-CM | POA: Diagnosis not present

## 2024-09-17 NOTE — Therapy (Signed)
 OUTPATIENT PHYSICAL THERAPY NEURO TREATMENT/ Physical Therapy Progress Note   Dates of reporting period  08/13/24   to   09/17/24    Patient Name: Eugene Martinez. MRN: [id number] DOB:Mar 11, 1946, 78 y.o., male Today's Date: 09/17/2024   PCP: Sadie Manna, MD  REFERRING PROVIDER: Caffaro, Kaitlin T, PA-C   END OF SESSION:  PT End of Session - 09/17/24 1317     Visit Number 40    Number of Visits 41    Date for Recertification  09/30/24    Progress Note Due on Visit 40    PT Start Time 1319    PT Stop Time 1357    PT Time Calculation (min) 38 min    Equipment Utilized During Treatment Gait belt    Activity Tolerance Patient tolerated treatment well    Behavior During Therapy WFL for tasks assessed/performed                          Past Medical History:  Diagnosis Date   Allergic state    Amyloidosis (HCC)    Anemia    BPH (benign prostatic hyperplasia)    DDD (degenerative disc disease), lumbar    Dysplastic nevus 10/13/2013   Left posterior waistline paraspinal. Moderate to severe atypia, pattern borders on early evolving MIS, edge involved. Excised 03/31/2014, margins free.   Dysplastic nevus 12/19/2017   Right upper back medial. Mild atypia, lateral and deep margins involved.    Dysplastic nevus 11/28/2022   right prox lat thigh, Severe atypia, Excised 01/08/23   Dysrhythmia    Environmental and seasonal allergies    Gallop rhythm    GERD (gastroesophageal reflux disease)    Hypertension    Mild mitral regurgitation    a. by echo 10/2012   Pain    bil. 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 10/2012 Echo: EF 55-60%, severe LVH with speckling of myocardium -? amyloid. Mild MR, mod dil LA.   Past Surgical History:  Procedure Laterality Date   CIRCUMCISION  1984   COLONOSCOPY  03/2012   COLONOSCOPY WITH PROPOFOL  N/A 10/29/2017   Procedure: COLONOSCOPY WITH PROPOFOL ;  Surgeon: Gaylyn Gladis PENNER, MD;  Location:  [facility] ENDOSCOPY;  Service: Endoscopy;  Laterality: N/A;   FOOT SURGERY     right foot   FOOT SURGERY Right 2017   LEG SURGERIES     HX.OF MULTIPLE SURGERIES FOR LEG LENGTH DUE TO POLIO   LUMBAR LAMINECTOMY/DECOMPRESSION MICRODISCECTOMY N/A 01/26/2019   Procedure: LUMBAR LAMINECTOMY/DECOMPRESSION MICRODISCECTOMY 1 LEVEL L3-4;  Surgeon: Bluford Standing, MD;  Location: [facility] ORS;  Service: Neurosurgery;  Laterality: N/A;   NECK SURGERY     PILONIDAL CYST / SINUS EXCISION     Patient Active Problem List   Diagnosis Date Noted   Ambulatory dysfunction 10/22/2023   Prediabetes 02/05/2023   Anemia 08/01/2020   Elevated hemoglobin A1c 08/01/2020   Loss of memory 08/01/2020   Sesamoiditis 06/25/2019   S/P lumbar laminectomy 01/26/2019   Weakness of both lower limbs 10/24/2018   LVH (left ventricular hypertrophy) 08/29/2016   Allergy 02/20/2016   Amyloidosis (HCC) 02/20/2016   Thrombocytopenia 11/23/2015   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   Airway hyperreactivity 05/07/2013   Benign fibroma of prostate 05/07/2013   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   Chronic diastolic heart failure (HCC) 11/18/2012   Proteinuria, Bence Jones 11/18/2012   Multiple allergies 10/29/2012   Hypertension  10/29/2012   RBBB (right bundle branch block with left anterior fascicular block) 10/29/2012   Proteinuria 10/29/2012    ONSET DATE: 04/22/24  REFERRING DIAG: R26.9 (ICD-10-CM) - Altered gait   THERAPY DIAG:  Difficulty in walking, not elsewhere classified  Unsteadiness on feet  Other abnormalities of gait and mobility  Muscle weakness (generalized)  Rationale for Evaluation and Treatment: Rehabilitation  SUBJECTIVE:                                                                                                                                                                                             SUBJECTIVE STATEMENT:   Pt reports doing well today. Pt denies any recent falls/stumbles  since prior session. Pt denies any updates to medications or medical appointment since prior session. Pt reports good compliance with HEP when time permits.     From Eval:Pt wife report he has been inconsistent with exercise since discharge. She also assist with all of subjective. Pt has been wall and furniture surfing to get around home. Pt had one pseudo fall in last 6 months; he went on a 4's into the recycling can but could not get up. Pt has not been following trough with his exercise. Pt has been using the cane almost all of the time outside of the house in comparison to rollator.  Patient and caregiver want to work on general strength and mobility. Pt caregiver has some concerns with getting rollator into and out of the car.  As well as getting patient into and out of the car.  Pt accompanied by: significant other  PERTINENT HISTORY: . 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 No  PRECAUTIONS: Fall  RED FLAGS: None   WEIGHT BEARING RESTRICTIONS: No  FALLS: Has patient fallen in last 6 months? No fall but went to ground intentionally and could not get up   LIVING ENVIRONMENT: LIVING ENVIRONMENT: Lives with: lives with their spouse Lives in: House/apartment Stairs: 1 step into home Has following equipment at home: Retail banker - 2 wheeled *On Arts development officer for [employer] at [employer]  PLOF: Independent with household mobility with device and Independent with community mobility with device  PATIENT GOALS: Improve balance and mobility   OBJECTIVE:  Note: Objective measures were completed at Evaluation unless otherwise noted.   COGNITION: Overall cognitive status: Impaired and Mild cogintive impairment     POSTURE: rounded shoulders, forward head, and increased thoracic kyphosis  LOWER EXTREMITY ROM:   WNL for tasks  assessed    LOWER EXTREMITY MMT:    MMT Right Eval Left Eval  Hip flexion 4 4  Hip extension    Hip abduction 4+ 4+  Hip adduction 4+ 4+  Hip internal rotation    Hip external rotation    Knee flexion 4- 4-  Knee extension 4 4  Ankle dorsiflexion -   Ankle plantarflexion -   Ankle inversion    Ankle eversion    (Blank rows = not tested)  BED MOBILITY:  Not tested  TRANSFERS: Sit to stand: Complete Independence and on a few reps noted instability in standing  Assistive device utilized: None     Stand to sit: Complete Independence  Assistive device utilized: None      GAIT: Findings: Gait Characteristics: With cane pt does not use in classic 2 or 3 point pattern but uses sporadically, decreased step length- Right, and decreased step length- Left, Distance walked: 30 ft, Assistive device utilized:Single point cane, Level of assistance: CGA, and Comments: Describing gait with SPC in R UE   FUNCTIONAL TESTS:  5 times sit to stand: 15.55 sec, imbalance on a few Timed up and go (TUG): 22.62 sec with cane  6 minute walk test: Test visit 2  10 meter walk test: 22.67 sec with cane, 13.6 sec with rollator   Berg Balance Scale: Test visit 2    PATIENT SURVEYS:  LEFS  Extreme difficulty/unable (0), Quite a bit of difficulty (1), Moderate difficulty (2), Little difficulty (3), No difficulty (4) Survey date:  WIFE FILLS OUT 05/12/24  Any of your usual work, housework or school activities 3  2. Usual hobbies, recreational or sporting activities 2  3. Getting into/out of the bath 4  4. Walking between rooms 3  5. Putting on socks/shoes 2  6. Squatting  1  7. Lifting an object, like a bag of groceries from the floor 3  8. Performing light activities around your home 3  9. Performing heavy activities around your home 2  10. Getting into/out of a car 1  11. Walking 2 blocks 2  12. Walking 1 mile 1  13. Going up/down 10 stairs (1 flight) 1  14. Standing for 1 hour 2  15.  sitting  for 1 hour 4  16. Running on even ground 1  17. Running on uneven ground 1  18. Making sharp turns while running fast 1  19. Hopping  0  20. Rolling over in bed 2  Score total:  40                                                                                                                                 TREATMENT DATE: 09/17/24   Gait training and TA- To improve functional movements patterns for everyday tasks as well as improve gait mechanics.   10 Meter Walk Test: Patient instructed to walk 10 meters (32.8 ft) as quickly and as safely as  possible at their normal speed Results: .82 m/s (with 4WW)  Cut off scores:   Household Ambulator  < 0.4 m/s  Limited Community Ambulator  0.4 - 0.8 m/s  [employer]  > 0.8 m/s  Increased fall risk  < 1.78m/s  Crossing a Street  >1.39m/s  MCID 0.05 m/s (small), 0.13 m/s (moderate), 0.06 m/s (significant)  (ANPTA Core Set of Outcome Measures for Adults with Neurologic Conditions, 2018)    Weighted gait training with 3# AW  x 56ft with instruction from PT for improved posture, and position of RW, as well as improved hip flexion and concurrent end swing knee extension to advance BLE   STS 2x15 reps no UE assist  Leg press SL on R LE only 3 x 10 @ 25#   Lateral stepping x 20 ft x 2 rounds then no AD  gait x 20 ft to rollator ( with CGA)   NMR: To facilitate reeducation of movement, balance, posture, coordination, and/or proprioception/kinesthetic sense.  On airex x 1 rounds - standing on airex with forward reach to basket of balls then forward basketball shot x 10 reps     PATIENT EDUCATION: Education details: Pt educated throughout session about proper posture and technique with exercises. Improved exercise technique, movement at target joints, use of target muscles after min to mod verbal, visual, tactile cues. Person educated: Patient Education method: Explanation Education comprehension: verbalized  understanding   HOME EXERCISE PROGRAM: Verbally added mini squat at bar to replace STS. No new handout provided, has handout from previous PT  Verbally added HS flexion with BTB to HEP. No new handout provided, has handout from previous PT    GOALS: :Goals reviewed with patient? Yes  SHORT TERM GOALS: Target date: 06/09/2024   Patient will be independent in home exercise program to improve strength/mobility for better functional independence with ADLs. Baseline: No HEP currently 8/11: Diligently completing HEP  Goal status: MET  LONG TERM GOALS: Target date: 08/04/2024  1.  Patient will complete five times sit to stand test in < 15 seconds without LOB indicating an increased LE strength and improved balance. Baseline: 15.55 sec, imbalance on a few reps requiring CGA for safety 7/7:14.11 sec 8/11:15.62 sec no UE 8/20:16.86 sec no UE support 8/27:13.95 sec no UE support Goal status: MET  2.  Patient will improve LEFS score to 50   to demonstrate statistically significant improvement in mobility and quality of life as it relates to their LE strength and mobility.  Baseline: 40 7/7:42 8/27:41 9/16:48 Goal status: ONGOING   3.  Patient will increase Berg Balance score by > 6 points to demonstrate decreased fall risk during functional activities. Baseline: 38 8/11:43 8/27: 46 Goal status: MET   4.   Patient will reduce timed up and go to <11 seconds to reduce fall risk and demonstrate improved transfer/gait ability. Baseline: 22.62 sec with SPC  8/11:15.39 sec with 4WW 8/27: 15.1 sec with RW 14.7 sec with no AD 9/16:14.73 sec with 4WW 16.2 sec no AD Goal status: ONGOING  5.   Patient will increase 10 meter walk test to >1.59m/s as to improve gait speed for better community ambulation and to reduce fall risk. Baseline: .44 m/s w/ SPC, .73 m/s with rollator .75 m/s with rollator 9/16: .78 m/s with 4WW .83 m/s Goal status: ONGOING  6.   Patient will increase six minute walk test distance  by 150 ft or greater for progression to community ambulator and improve gait ability  Baseline: progress note 7/7:750 ft, increased shuffling style gait from minute 2 and on as well as increased trunk flexion from this point on as well. 8/11: 750 ft with 4WW, improved gait quality ( no shuffling until last minute) and improved trunk flexion posture. Similar speed but less fatigue 8/27: 917 ft with 4WW; flexed posture dominates gait and causing shuffling last 1.5 min  Goal status: MET    ASSESSMENT:  CLINICAL IMPRESSION:    Patient arrived with good motivation for completion of pt activities. Assessed and pt able to ambulate at increased speed safely with rollator. Still along plan to discharge in a few weeks so no other goals assessed at this time. Pt continues with functional strength and movement training in preparation for discharge in coming weeks. Pt progresses with single leg leg press to isolate R LE gluteal and quad strength. Patient's condition has the potential to improve in response to therapy. Maximum improvement is yet to be obtained. The anticipated improvement is attainable and reasonable in a generally predictable time.  Pt will continue to benefit from skilled physical therapy intervention to address impairments, improve QOL, and attain therapy goals.   OBJECTIVE IMPAIRMENTS: Abnormal gait, decreased activity tolerance, decreased balance, decreased endurance, decreased knowledge of condition, decreased knowledge of use of DME, and decreased strength.   ACTIVITY LIMITATIONS: standing, squatting, stairs, transfers, and locomotion level  PARTICIPATION LIMITATIONS: meal prep, shopping, and community activity  PERSONAL FACTORS: Age, Behavior pattern, and 3+ comorbidities: HTN, Chronic Diastolic Heart Failure, post polio,  are also affecting patient's functional outcome.   REHAB POTENTIAL: Good  CLINICAL DECISION MAKING: Evolving/moderate complexity  EVALUATION COMPLEXITY:  Moderate  PLAN:  PT FREQUENCY: 2x/week  PT DURATION: 12 weeks  PLANNED INTERVENTIONS: 97750- Physical Performance Testing, 97110-Therapeutic exercises, 97530- Therapeutic activity, [id number]- Neuromuscular re-education, 97535- Self Care, [id number]- Manual therapy, and [id number]- Gait training  PLAN FOR NEXT SESSION:   Continue to Progress Hip flexor intervention, endurance and extensors.  static balance interventions reflective of BERG deficits  Training keeping COM over BOS    Lonni KATHEE Gainer PT ,DPT Physical Therapist- [place]  [facility]   1:22 PM 09/17/24

## 2024-09-22 ENCOUNTER — Ambulatory Visit: Admitting: Physical Therapy

## 2024-09-22 DIAGNOSIS — R262 Difficulty in walking, not elsewhere classified: Secondary | ICD-10-CM | POA: Diagnosis not present

## 2024-09-22 DIAGNOSIS — M6281 Muscle weakness (generalized): Secondary | ICD-10-CM

## 2024-09-22 DIAGNOSIS — R2689 Other abnormalities of gait and mobility: Secondary | ICD-10-CM

## 2024-09-22 DIAGNOSIS — R2681 Unsteadiness on feet: Secondary | ICD-10-CM

## 2024-09-22 NOTE — Therapy (Signed)
 OUTPATIENT PHYSICAL THERAPY NEURO TREATMENT   Patient Name: Eugene Martinez. MRN: [id number] DOB:Aug 01, 1946, 78 y.o., male Today's Date: 09/22/2024   PCP: Sadie Manna, MD  REFERRING PROVIDER: Caffaro, Kaitlin T, PA-C   END OF SESSION:  PT End of Session - 09/22/24 1321     Visit Number 41    Number of Visits 41    Date for Recertification  09/30/24    Progress Note Due on Visit 50    PT Start Time 1319    PT Stop Time 1358    PT Time Calculation (min) 39 min    Equipment Utilized During Treatment Gait belt    Activity Tolerance Patient tolerated treatment well    Behavior During Therapy WFL for tasks assessed/performed                          Past Medical History:  Diagnosis Date   Allergic state    Amyloidosis (HCC)    Anemia    BPH (benign prostatic hyperplasia)    DDD (degenerative disc disease), lumbar    Dysplastic nevus 10/13/2013   Left posterior waistline paraspinal. Moderate to severe atypia, pattern borders on early evolving MIS, edge involved. Excised 03/31/2014, margins free.   Dysplastic nevus 12/19/2017   Right upper back medial. Mild atypia, lateral and deep margins involved.    Dysplastic nevus 11/28/2022   right prox lat thigh, Severe atypia, Excised 01/08/23   Dysrhythmia    Environmental and seasonal allergies    Gallop rhythm    GERD (gastroesophageal reflux disease)    Hypertension    Mild mitral regurgitation    a. by echo 10/2012   Pain    bil. 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 10/2012 Echo: EF 55-60%, severe LVH with speckling of myocardium -? amyloid. Mild MR, mod dil LA.   Past Surgical History:  Procedure Laterality Date   CIRCUMCISION  1984   COLONOSCOPY  03/2012   COLONOSCOPY WITH PROPOFOL  N/A 10/29/2017   Procedure: COLONOSCOPY WITH PROPOFOL ;  Surgeon: Gaylyn Gladis PENNER, MD;  Location: [facility] ENDOSCOPY;  Service: Endoscopy;  Laterality: N/A;   FOOT SURGERY     right foot    FOOT SURGERY Right 2017   LEG SURGERIES     HX.OF MULTIPLE SURGERIES FOR LEG LENGTH DUE TO POLIO   LUMBAR LAMINECTOMY/DECOMPRESSION MICRODISCECTOMY N/A 01/26/2019   Procedure: LUMBAR LAMINECTOMY/DECOMPRESSION MICRODISCECTOMY 1 LEVEL L3-4;  Surgeon: Bluford Standing, MD;  Location: [facility] ORS;  Service: Neurosurgery;  Laterality: N/A;   NECK SURGERY     PILONIDAL CYST / SINUS EXCISION     Patient Active Problem List   Diagnosis Date Noted   Ambulatory dysfunction 10/22/2023   Prediabetes 02/05/2023   Anemia 08/01/2020   Elevated hemoglobin A1c 08/01/2020   Loss of memory 08/01/2020   Sesamoiditis 06/25/2019   S/P lumbar laminectomy 01/26/2019   Weakness of both lower limbs 10/24/2018   LVH (left ventricular hypertrophy) 08/29/2016   Allergy 02/20/2016   Amyloidosis (HCC) 02/20/2016   Thrombocytopenia 11/23/2015   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   Airway hyperreactivity 05/07/2013   Benign fibroma of prostate 05/07/2013   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   Chronic diastolic heart failure (HCC) 11/18/2012   Proteinuria, Bence Jones 11/18/2012   Multiple allergies 10/29/2012   Hypertension 10/29/2012   RBBB (right bundle branch block with left anterior fascicular block) 10/29/2012   Proteinuria 10/29/2012  ONSET DATE: 04/22/24  REFERRING DIAG: R26.9 (ICD-10-CM) - Altered gait   THERAPY DIAG:  Difficulty in walking, not elsewhere classified  Unsteadiness on feet  Other abnormalities of gait and mobility  Muscle weakness (generalized)  Rationale for Evaluation and Treatment: Rehabilitation  SUBJECTIVE:                                                                                                                                                                                             SUBJECTIVE STATEMENT:   Pt reports doing well today. Pt had one situation where he got needed help to get back to the house but it did not result in a fall.     From Eval:Pt  wife report he has been inconsistent with exercise since discharge. She also assist with all of subjective. Pt has been wall and furniture surfing to get around home. Pt had one pseudo fall in last 6 months; he went on a 4's into the recycling can but could not get up. Pt has not been following trough with his exercise. Pt has been using the cane almost all of the time outside of the house in comparison to rollator.  Patient and caregiver want to work on general strength and mobility. Pt caregiver has some concerns with getting rollator into and out of the car.  As well as getting patient into and out of the car.  Pt accompanied by: significant other  PERTINENT HISTORY: . 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 No  PRECAUTIONS: Fall  RED FLAGS: None   WEIGHT BEARING RESTRICTIONS: No  FALLS: Has patient fallen in last 6 months? No fall but went to ground intentionally and could not get up   LIVING ENVIRONMENT: LIVING ENVIRONMENT: Lives with: lives with their spouse Lives in: House/apartment Stairs: 1 step into home Has following equipment at home: Retail banker - 2 wheeled *On Arts development officer for [employer] at [employer]  PLOF: Independent with household mobility with device and Independent with community mobility with device  PATIENT GOALS: Improve balance and mobility   OBJECTIVE:  Note: Objective measures were completed at Evaluation unless otherwise noted.   COGNITION: Overall cognitive status: Impaired and Mild cogintive impairment     POSTURE: rounded shoulders, forward head, and increased thoracic kyphosis  LOWER EXTREMITY ROM:   WNL for tasks assessed    LOWER EXTREMITY MMT:    MMT Right Eval Left Eval  Hip flexion 4 4  Hip extension    Hip  abduction 4+ 4+  Hip adduction 4+ 4+  Hip internal rotation    Hip  external rotation    Knee flexion 4- 4-  Knee extension 4 4  Ankle dorsiflexion -   Ankle plantarflexion -   Ankle inversion    Ankle eversion    (Blank rows = not tested)  BED MOBILITY:  Not tested  TRANSFERS: Sit to stand: Complete Independence and on a few reps noted instability in standing  Assistive device utilized: None     Stand to sit: Complete Independence  Assistive device utilized: None      GAIT: Findings: Gait Characteristics: With cane pt does not use in classic 2 or 3 point pattern but uses sporadically, decreased step length- Right, and decreased step length- Left, Distance walked: 30 ft, Assistive device utilized:Single point cane, Level of assistance: CGA, and Comments: Describing gait with SPC in R UE   FUNCTIONAL TESTS:  5 times sit to stand: 15.55 sec, imbalance on a few Timed up and go (TUG): 22.62 sec with cane  6 minute walk test: Test visit 2  10 meter walk test: 22.67 sec with cane, 13.6 sec with rollator   Berg Balance Scale: Test visit 2    PATIENT SURVEYS:  LEFS  Extreme difficulty/unable (0), Quite a bit of difficulty (1), Moderate difficulty (2), Little difficulty (3), No difficulty (4) Survey date:  WIFE FILLS OUT 05/12/24  Any of your usual work, housework or school activities 3  2. Usual hobbies, recreational or sporting activities 2  3. Getting into/out of the bath 4  4. Walking between rooms 3  5. Putting on socks/shoes 2  6. Squatting  1  7. Lifting an object, like a bag of groceries from the floor 3  8. Performing light activities around your home 3  9. Performing heavy activities around your home 2  10. Getting into/out of a car 1  11. Walking 2 blocks 2  12. Walking 1 mile 1  13. Going up/down 10 stairs (1 flight) 1  14. Standing for 1 hour 2  15.  sitting for 1 hour 4  16. Running on even ground 1  17. Running on uneven ground 1  18. Making sharp turns while running fast 1  19. Hopping  0  20. Rolling over in bed 2  Score  total:  40                                                                                                                                 TREATMENT DATE: 09/22/24   TA- To improve functional movements patterns for everyday tasks as well as improve gait mechanics.   Weighted gait training with 3# AW  x 556ft with instruction from PT for improved posture, and position of RW, as well as improved hip flexion and concurrent end swing knee extension to advance BLE   STS 2x15 reps no UE assist  Leg press SL  on R LE only 3 x 10 @ 25# - high difficulty   Lateral stepping x 20 ft x 2 rounds then no AD   NMR: To facilitate reeducation of movement, balance, posture, coordination, and/or proprioception/kinesthetic sense.  One LE on airex other on step 2 x 45 sec ea    PATIENT EDUCATION: Education details: Pt educated throughout session about proper posture and technique with exercises. Improved exercise technique, movement at target joints, use of target muscles after min to mod verbal, visual, tactile cues. Person educated: Patient Education method: Explanation Education comprehension: verbalized understanding   HOME EXERCISE PROGRAM: Verbally added mini squat at bar to replace STS. No new handout provided, has handout from previous PT  Verbally added HS flexion with BTB to HEP. No new handout provided, has handout from previous PT    GOALS: :Goals reviewed with patient? Yes  SHORT TERM GOALS: Target date: 06/09/2024   Patient will be independent in home exercise program to improve strength/mobility for better functional independence with ADLs. Baseline: No HEP currently 8/11: Diligently completing HEP  Goal status: MET  LONG TERM GOALS: Target date: 08/04/2024  1.  Patient will complete five times sit to stand test in < 15 seconds without LOB indicating an increased LE strength and improved balance. Baseline: 15.55 sec, imbalance on a few reps requiring CGA for safety 7/7:14.11 sec  8/11:15.62 sec no UE 8/20:16.86 sec no UE support 8/27:13.95 sec no UE support Goal status: MET  2.  Patient will improve LEFS score to 50   to demonstrate statistically significant improvement in mobility and quality of life as it relates to their LE strength and mobility.  Baseline: 40 7/7:42 8/27:41 9/16:48 Goal status: ONGOING   3.  Patient will increase Berg Balance score by > 6 points to demonstrate decreased fall risk during functional activities. Baseline: 38 8/11:43 8/27: 46 Goal status: MET   4.   Patient will reduce timed up and go to <11 seconds to reduce fall risk and demonstrate improved transfer/gait ability. Baseline: 22.62 sec with SPC  8/11:15.39 sec with 4WW 8/27: 15.1 sec with RW 14.7 sec with no AD 9/16:14.73 sec with 4WW 16.2 sec no AD Goal status: ONGOING  5.   Patient will increase 10 meter walk test to >1.15m/s as to improve gait speed for better community ambulation and to reduce fall risk. Baseline: .44 m/s w/ SPC, .73 m/s with rollator .75 m/s with rollator 9/16: .78 m/s with 4WW .83 m/s Goal status: ONGOING  6.   Patient will increase six minute walk test distance by 150 ft or greater for progression to community ambulator and improve gait ability Baseline: progress note 7/7:750 ft, increased shuffling style gait from minute 2 and on as well as increased trunk flexion from this point on as well. 8/11: 750 ft with 4WW, improved gait quality ( no shuffling until last minute) and improved trunk flexion posture. Similar speed but less fatigue 8/27: 917 ft with 4WW; flexed posture dominates gait and causing shuffling last 1.5 min  Goal status: MET    ASSESSMENT:  CLINICAL IMPRESSION:   Continued with current plan of care as laid out in evaluation and recent prior sessions. Pt remains motivated to advance progress toward goals in order to maximize independence and safety at home. Pt requires high level assistance and cuing for completion of exercises in order to  provide adequate level of stimulation challenge while minimizing pain and discomfort when possible. Continuing to focus on functional strengthening in prep  for discharge in upcomming visits. Pt closely monitored throughout session pt response and to maximize patient safety during interventions. Pt continues to demonstrate progress toward goals AEB progression of interventions this date either in volume or intensity.   OBJECTIVE IMPAIRMENTS: Abnormal gait, decreased activity tolerance, decreased balance, decreased endurance, decreased knowledge of condition, decreased knowledge of use of DME, and decreased strength.   ACTIVITY LIMITATIONS: standing, squatting, stairs, transfers, and locomotion level  PARTICIPATION LIMITATIONS: meal prep, shopping, and community activity  PERSONAL FACTORS: Age, Behavior pattern, and 3+ comorbidities: HTN, Chronic Diastolic Heart Failure, post polio,  are also affecting patient's functional outcome.   REHAB POTENTIAL: Good  CLINICAL DECISION MAKING: Evolving/moderate complexity  EVALUATION COMPLEXITY: Moderate  PLAN:  PT FREQUENCY: 2x/week  PT DURATION: 12 weeks  PLANNED INTERVENTIONS: 97750- Physical Performance Testing, 97110-Therapeutic exercises, 97530- Therapeutic activity, [id number]- Neuromuscular re-education, 97535- Self Care, [id number]- Manual therapy, and [id number]- Gait training  PLAN FOR NEXT SESSION:   Continue to Progress Hip flexor intervention, endurance and extensors.  static balance interventions reflective of BERG deficits  Training keeping COM over BOS    Lonni KATHEE Gainer PT ,DPT Physical Therapist- [place]  [facility]   1:22 PM 09/22/24

## 2024-09-24 ENCOUNTER — Ambulatory Visit

## 2024-09-24 DIAGNOSIS — R2689 Other abnormalities of gait and mobility: Secondary | ICD-10-CM

## 2024-09-24 DIAGNOSIS — R41841 Cognitive communication deficit: Secondary | ICD-10-CM

## 2024-09-24 DIAGNOSIS — R262 Difficulty in walking, not elsewhere classified: Secondary | ICD-10-CM

## 2024-09-24 DIAGNOSIS — R2681 Unsteadiness on feet: Secondary | ICD-10-CM

## 2024-09-24 DIAGNOSIS — R278 Other lack of coordination: Secondary | ICD-10-CM

## 2024-09-24 DIAGNOSIS — M6281 Muscle weakness (generalized): Secondary | ICD-10-CM

## 2024-09-24 NOTE — Therapy (Signed)
 OUTPATIENT PHYSICAL THERAPY NEURO TREATMENT   Patient Name: Eugene Martinez. MRN: [id number] DOB:[birth year], 78 y.o., male Today's Date: 09/25/2024   PCP: Sadie Manna, MD  REFERRING PROVIDER: Caffaro, Kaitlin T, PA-C   END OF SESSION:  PT End of Session - 09/24/24 1023     Visit Number 39   corrected visit count   Number of Visits 41    Date for Recertification  09/30/24    Progress Note Due on Visit 50    PT Start Time 1015    PT Stop Time 1059    PT Time Calculation (min) 44 min    Equipment Utilized During Treatment Gait belt    Activity Tolerance Patient tolerated treatment well    Behavior During Therapy WFL for tasks assessed/performed                           Past Medical History:  Diagnosis Date   Allergic state    Amyloidosis (HCC)    Anemia    BPH (benign prostatic hyperplasia)    DDD (degenerative disc disease), lumbar    Dysplastic nevus 10/13/2013   Left posterior waistline paraspinal. Moderate to severe atypia, pattern borders on early evolving MIS, edge involved. Excised 03/31/2014, margins free.   Dysplastic nevus 12/19/2017   Right upper back medial. Mild atypia, lateral and deep margins involved.    Dysplastic nevus 11/28/2022   right prox lat thigh, Severe atypia, Excised 01/08/23   Dysrhythmia    Environmental and seasonal allergies    Gallop rhythm    GERD (gastroesophageal reflux disease)    Hypertension    Mild mitral regurgitation    a. by echo 10/2012   Pain    bil. 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 10/2012 Echo: EF 55-60%, severe LVH with speckling of myocardium -? amyloid. Mild MR, mod dil LA.   Past Surgical History:  Procedure Laterality Date   CIRCUMCISION  1984   COLONOSCOPY  03/2012   COLONOSCOPY WITH PROPOFOL  N/A 10/29/2017   Procedure: COLONOSCOPY WITH PROPOFOL ;  Surgeon: Gaylyn Gladis PENNER, MD;  Location: [facility] ENDOSCOPY;  Service: Endoscopy;  Laterality: N/A;   FOOT  SURGERY     right foot   FOOT SURGERY Right 2017   LEG SURGERIES     HX.OF MULTIPLE SURGERIES FOR LEG LENGTH DUE TO POLIO   LUMBAR LAMINECTOMY/DECOMPRESSION MICRODISCECTOMY N/A 01/26/2019   Procedure: LUMBAR LAMINECTOMY/DECOMPRESSION MICRODISCECTOMY 1 LEVEL L3-4;  Surgeon: Bluford Standing, MD;  Location: [facility] ORS;  Service: Neurosurgery;  Laterality: N/A;   NECK SURGERY     PILONIDAL CYST / SINUS EXCISION     Patient Active Problem List   Diagnosis Date Noted   Ambulatory dysfunction 10/22/2023   Prediabetes 02/05/2023   Anemia 08/01/2020   Elevated hemoglobin A1c 08/01/2020   Loss of memory 08/01/2020   Sesamoiditis 06/25/2019   S/P lumbar laminectomy 01/26/2019   Weakness of both lower limbs 10/24/2018   LVH (left ventricular hypertrophy) 08/29/2016   Allergy 02/20/2016   Amyloidosis (HCC) 02/20/2016   Thrombocytopenia 11/23/2015   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   Airway hyperreactivity 05/07/2013   Benign fibroma of prostate 05/07/2013   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   Chronic diastolic heart failure (HCC) 11/18/2012   Proteinuria, Bence Jones 11/18/2012   Multiple allergies 10/29/2012   Hypertension 10/29/2012   RBBB (right bundle branch block with left anterior fascicular block) 10/29/2012  Proteinuria 10/29/2012    ONSET DATE: 04/22/24  REFERRING DIAG: R26.9 (ICD-10-CM) - Altered gait   THERAPY DIAG:  Difficulty in walking, not elsewhere classified  Unsteadiness on feet  Other abnormalities of gait and mobility  Muscle weakness (generalized)  Other lack of coordination  Cognitive communication deficit  Rationale for Evaluation and Treatment: Rehabilitation  SUBJECTIVE:                                                                                                                                                                                             SUBJECTIVE STATEMENT:   Pt reports doing okay without any new issues. States trying to  workout more at home. Reports difficulty at times applying his ankle weights. Wife present and states she offers to help but he declines.    From Eval:Pt wife report he has been inconsistent with exercise since discharge. She also assist with all of subjective. Pt has been wall and furniture surfing to get around home. Pt had one pseudo fall in last 6 months; he went on a 4's into the recycling can but could not get up. Pt has not been following trough with his exercise. Pt has been using the cane almost all of the time outside of the house in comparison to rollator.  Patient and caregiver want to work on general strength and mobility. Pt caregiver has some concerns with getting rollator into and out of the car.  As well as getting patient into and out of the car.  Pt accompanied by: significant other  PERTINENT HISTORY: . 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 No  PRECAUTIONS: Fall  RED FLAGS: None   WEIGHT BEARING RESTRICTIONS: No  FALLS: Has patient fallen in last 6 months? No fall but went to ground intentionally and could not get up   LIVING ENVIRONMENT: LIVING ENVIRONMENT: Lives with: lives with their spouse Lives in: House/apartment Stairs: 1 step into home Has following equipment at home: Retail banker - 2 wheeled *On Arts development officer for [employer] at [employer]  PLOF: Independent with household mobility with device and Independent with community mobility with device  PATIENT GOALS: Improve balance and mobility   OBJECTIVE:  Note: Objective measures were completed at Evaluation unless otherwise noted.   COGNITION: Overall cognitive status: Impaired and Mild cogintive impairment     POSTURE: rounded shoulders, forward head, and increased thoracic kyphosis  LOWER EXTREMITY ROM:   WNL for tasks assessed    LOWER  EXTREMITY MMT:  MMT Right Eval Left Eval  Hip flexion 4 4  Hip extension    Hip abduction 4+ 4+  Hip adduction 4+ 4+  Hip internal rotation    Hip external rotation    Knee flexion 4- 4-  Knee extension 4 4  Ankle dorsiflexion -   Ankle plantarflexion -   Ankle inversion    Ankle eversion    (Blank rows = not tested)  BED MOBILITY:  Not tested  TRANSFERS: Sit to stand: Complete Independence and on a few reps noted instability in standing  Assistive device utilized: None     Stand to sit: Complete Independence  Assistive device utilized: None      GAIT: Findings: Gait Characteristics: With cane pt does not use in classic 2 or 3 point pattern but uses sporadically, decreased step length- Right, and decreased step length- Left, Distance walked: 30 ft, Assistive device utilized:Single point cane, Level of assistance: CGA, and Comments: Describing gait with SPC in R UE   FUNCTIONAL TESTS:  5 times sit to stand: 15.55 sec, imbalance on a few Timed up and go (TUG): 22.62 sec with cane  6 minute walk test: Test visit 2  10 meter walk test: 22.67 sec with cane, 13.6 sec with rollator   Berg Balance Scale: Test visit 2    PATIENT SURVEYS:  LEFS  Extreme difficulty/unable (0), Quite a bit of difficulty (1), Moderate difficulty (2), Little difficulty (3), No difficulty (4) Survey date:  WIFE FILLS OUT 05/12/24  Any of your usual work, housework or school activities 3  2. Usual hobbies, recreational or sporting activities 2  3. Getting into/out of the bath 4  4. Walking between rooms 3  5. Putting on socks/shoes 2  6. Squatting  1  7. Lifting an object, like a bag of groceries from the floor 3  8. Performing light activities around your home 3  9. Performing heavy activities around your home 2  10. Getting into/out of a car 1  11. Walking 2 blocks 2  12. Walking 1 mile 1  13. Going up/down 10 stairs (1 flight) 1  14. Standing for 1 hour 2  15.  sitting for 1 hour 4  16.  Running on even ground 1  17. Running on uneven ground 1  18. Making sharp turns while running fast 1  19. Hopping  0  20. Rolling over in bed 2  Score total:  40                                                                                                                                 TREATMENT DATE: 09/25/24   TA- To improve functional movements patterns for everyday tasks as well as improve gait mechanics.   - Dynamic standing hip march with 3# with external cue of PT placing 1/2 foam roll for 90/90 hip/knee ROM  for desired height- alt LE 2 x 15 reps -  STS 2x15 reps no UE assist -Seated hip flex 3# AW- 2 x 15 reps  -Weighted gait training with 3# AW  x 500 ft with instruction from PT for improved posture, and position of RW, as well as improved hip flexion and concurrent end swing knee extension to advance BLE  -Lateral stepping x length of support bar approx 6 feet and back x 12 rounds- no AD - VC for wide steps    PATIENT EDUCATION: Education details: Pt educated throughout session about proper posture and technique with exercises. Improved exercise technique, movement at target joints, use of target muscles after min to mod verbal, visual, tactile cues. Person educated: Patient Education method: Explanation Education comprehension: verbalized understanding   HOME EXERCISE PROGRAM: Verbally added mini squat at bar to replace STS. No new handout provided, has handout from previous PT  Verbally added HS flexion with BTB to HEP. No new handout provided, has handout from previous PT    GOALS: :Goals reviewed with patient? Yes  SHORT TERM GOALS: Target date: 06/09/2024   Patient will be independent in home exercise program to improve strength/mobility for better functional independence with ADLs. Baseline: No HEP currently 8/11: Diligently completing HEP  Goal status: MET  LONG TERM GOALS: Target date: 08/04/2024  1.  Patient will complete five times sit to stand test in  < 15 seconds without LOB indicating an increased LE strength and improved balance. Baseline: 15.55 sec, imbalance on a few reps requiring CGA for safety 7/7:14.11 sec 8/11:15.62 sec no UE 8/20:16.86 sec no UE support 8/27:13.95 sec no UE support Goal status: MET  2.  Patient will improve LEFS score to 50   to demonstrate statistically significant improvement in mobility and quality of life as it relates to their LE strength and mobility.  Baseline: 40 7/7:42 8/27:41 9/16:48 Goal status: ONGOING   3.  Patient will increase Berg Balance score by > 6 points to demonstrate decreased fall risk during functional activities. Baseline: 38 8/11:43 8/27: 46 Goal status: MET   4.   Patient will reduce timed up and go to <11 seconds to reduce fall risk and demonstrate improved transfer/gait ability. Baseline: 22.62 sec with SPC  8/11:15.39 sec with 4WW 8/27: 15.1 sec with RW 14.7 sec with no AD 9/16:14.73 sec with 4WW 16.2 sec no AD Goal status: ONGOING  5.   Patient will increase 10 meter walk test to >1.60m/s as to improve gait speed for better community ambulation and to reduce fall risk. Baseline: .44 m/s w/ SPC, .73 m/s with rollator .75 m/s with rollator 9/16: .78 m/s with 4WW .83 m/s Goal status: ONGOING  6.   Patient will increase six minute walk test distance by 150 ft or greater for progression to community ambulator and improve gait ability Baseline: progress note 7/7:750 ft, increased shuffling style gait from minute 2 and on as well as increased trunk flexion from this point on as well. 8/11: 750 ft with 4WW, improved gait quality ( no shuffling until last minute) and improved trunk flexion posture. Similar speed but less fatigue 8/27: 917 ft with 4WW; flexed posture dominates gait and causing shuffling last 1.5 min  Goal status: MET    ASSESSMENT:  CLINICAL IMPRESSION:    Patient performed well with review of HEP and emphasis on doing as much as he can for himself. He was motivated  and performed all activities with minimal VC- good overall technique with STS. Reviewed many exercises to be performed at home to make  sure he could perform with only supervision of wife. Will need final reassessment of goals and final HEP review with plan to discharge from PT next visit.    OBJECTIVE IMPAIRMENTS: Abnormal gait, decreased activity tolerance, decreased balance, decreased endurance, decreased knowledge of condition, decreased knowledge of use of DME, and decreased strength.   ACTIVITY LIMITATIONS: standing, squatting, stairs, transfers, and locomotion level  PARTICIPATION LIMITATIONS: meal prep, shopping, and community activity  PERSONAL FACTORS: Age, Behavior pattern, and 3+ comorbidities: HTN, Chronic Diastolic Heart Failure, post polio,  are also affecting patient's functional outcome.   REHAB POTENTIAL: Good  CLINICAL DECISION MAKING: Evolving/moderate complexity  EVALUATION COMPLEXITY: Moderate  PLAN:  PT FREQUENCY: 2x/week  PT DURATION: 12 weeks  PLANNED INTERVENTIONS: 97750- Physical Performance Testing, 97110-Therapeutic exercises, 97530- Therapeutic activity, [id number]- Neuromuscular re-education, 97535- Self Care, [id number]- Manual therapy, and 97116- Gait training  PLAN FOR NEXT SESSION:   Reassess all remaining goals Finalize HEP  Discharge to HEP as appropriate.   Reyes LOISE London PT  Physical Therapist- [place]  [facility]   7:36 AM 09/25/24

## 2024-09-28 ENCOUNTER — Encounter: Payer: Self-pay | Admitting: Medical

## 2024-09-28 ENCOUNTER — Ambulatory Visit: Attending: Medical | Admitting: Medical

## 2024-09-28 VITALS — BP 128/60 | HR 55 | Ht 66.0 in | Wt 164.0 lb

## 2024-09-28 DIAGNOSIS — I440 Atrioventricular block, first degree: Secondary | ICD-10-CM | POA: Diagnosis not present

## 2024-09-28 DIAGNOSIS — E8581 Light chain (AL) amyloidosis: Secondary | ICD-10-CM | POA: Diagnosis not present

## 2024-09-28 DIAGNOSIS — I517 Cardiomegaly: Secondary | ICD-10-CM | POA: Diagnosis not present

## 2024-09-28 DIAGNOSIS — I5032 Chronic diastolic (congestive) heart failure: Secondary | ICD-10-CM | POA: Diagnosis not present

## 2024-09-28 NOTE — Progress Notes (Signed)
 Cardiology Office Note   Date:  09/28/2024  ID:  Eugene Martinez., DOB 09/22/1946, MRN [id number] PCP: Sadie Manna, MD  [facility] Providers Cardiologist:  Evalene Lunger, MD   History of Present Illness Eugene Martinez. is a 78 y.o. male  with a hx of AL amyloidosis with previous chemotherapy treatment, severe LVH, h/o polio with right LE brace, cognitive impairment who is being seen for 1 year follow-up.   The patient was last seen 09/2023 and was stable from a cardiac perspective.  Echo 10/2023 showed LVEF 60 to 65%, no wall motion abnormalities, moderate LVH with severe LVH of the inferior/posterior lateral walls, grade 1 diastolic dysfunction, mildly dilated left atrium, trivial MR, borderline dilation of the ascending aorta measuring 37 mm.  Today, the patient is overall doing well. He denies chest pain, SOB, lower leg edema, orthopnea or pnd. No pre-syncope or syncope. He uses a Museum/gallery exhibitions officer most of the time. Wife reports her biggest concern is cognitive decline.   Studies Reviewed EKG Interpretation Date/Time:  Monday September 28 2024 08:43:08 EDT Ventricular Rate:  55 PR Interval:  294 QRS Duration:  174 QT Interval:  510 QTC Calculation: 487 R Axis:   -73  Text Interpretation: Sinus bradycardia with 1st degree A-V block Right bundle branch block Left anterior fascicular block Bifascicular block Left ventricular hypertrophy with repolarization abnormality ( R in aVL , Romhilt-Estes ) Cannot rule out Septal infarct (cited on or before 12-Jan-2019) When compared with ECG of 27-Sep-2023 15:52, T wave inversion less evident in Lateral leads Confirmed by Franchester, Eugene Martinez ([id number]) on 09/28/2024 8:47:24 AM    Echo 10/2023 1. Left ventricular ejection fraction, by estimation, is 60 to 65%. The  left ventricle has normal function. The left ventricle has no regional  wall motion abnormalities. There is moderate left ventricular hypertrophy  with severe LVH of the   inferior/posterior and lateral walls. Left ventricular diastolic  parameters are consistent with Grade I diastolic dysfunction (impaired  relaxation). The average left ventricular global longitudinal strain is  -20.4 %.   2. Right ventricular systolic function is normal. The right ventricular  size is normal. Tricuspid regurgitation signal is inadequate for assessing  PA pressure.   3. Left atrial size was mildly dilated.   4. The mitral valve is normal in structure. Trivial mitral valve  regurgitation. No evidence of mitral stenosis. Moderate mitral annular  calcification.   5. The aortic valve is tricuspid. Aortic valve regurgitation is not  visualized. No aortic stenosis is present.   6. There is borderline dilatation of the ascending aorta, measuring 37  mm.   7. The inferior vena cava is normal in size with greater than 50%  respiratory variability, suggesting right atrial pressure of 3 mmHg.   Echo 2022 1. Left ventricular ejection fraction, by estimation, is 60 to 65%. The  left ventricle has normal function. The left ventricle has no regional  wall motion abnormalities. There is severe left ventricular hypertrophy.  Left ventricular diastolic parameters   are consistent with Grade I diastolic dysfunction (impaired relaxation).  The average left ventricular global longitudinal strain is -16.1 %. The  global longitudinal strain is normal.   2. Right ventricular systolic function is normal. The right ventricular  size is normal.   3. The mitral valve is normal in structure. Mild mitral valve  regurgitation. No evidence of mitral stenosis. Moderate mitral annular  calcification.   4. The aortic valve is normal in structure. Aortic valve regurgitation  is  not visualized. No aortic stenosis is present.   5. The inferior vena cava is normal in size with greater than 50%  respiratory variability, suggesting right atrial pressure of 3 mmHg.   Comparison(s): LVEF 55-60%.   Echo  2015 Study Conclusions   - Left ventricle: The cavity size was normal. There was severe    concentric hypertrophy. Systolic function was vigorous. The    estimated ejection fraction was in the range of 65% to 70%. There    was no dynamic obstruction. Wall motion was normal; there were no    regional wall motion abnormalities. Features are consistent with    a pseudonormal left ventricular filling pattern, with concomitant    abnormal relaxation and increased filling pressure (grade 2    diastolic dysfunction).  - Mitral valve: Calcified annulus.  - Left atrium: The atrium was mildly dilated.   cMRI 2013 IMPRESSION:  1. Normal LV size with severe LV hypertrophy.  EF 66%.   2. Normal RV size and systolic function with at least mild RV  hypertrophy.   3. We were unable to null the LV myocardium for delayed enhancement  imaging.   4. The constellation of findings on this study is suggestive of  cardiac amyloidosis.  Would consider abdominal fat pad biopsy and  lab workup for amyloidosis.       Physical Exam VS:  BP 128/60 (BP Location: Left Arm, Patient Position: Sitting, Cuff Size: Normal)   Pulse (!) 55   Ht 5' 6 (1.676 m)   Wt 164 lb (74.4 kg)   SpO2 95%   BMI 26.47 kg/m        Wt Readings from Last 3 Encounters:  09/28/24 164 lb (74.4 kg)  04/08/24 162 lb (73.5 kg)  09/27/23 165 lb 9.6 oz (75.1 kg)    GEN: Well nourished, well developed in no acute distress NECK: No JVD; No carotid bruits CARDIAC: bradycardia, RR, no murmurs, rubs, gallops RESPIRATORY:  Clear to auscultation without rales, wheezing or rhonchi  ABDOMEN: Soft, non-tender, non-distended EXTREMITIES:  No edema; No deformity   ASSESSMENT AND PLAN  History of amyloidosis Severe LV HFpEF Patient has a history of amyloidosis treated with chemotherapy.  Most recent echocardiogram showed EF of 60 to 65%, moderate LVH with severe LVH of the inferior/posterior lateral walls, grade 1 diastolic dysfunction,  trivial MR with moderate calcification.  Patient is compensated.  Function is limited due to history of polio with leg braces.  Patient takes metoprolol  12.5 mg daily.  EKG showing worsening PRI of 294 ms.  I will hold metoprolol  at this time.  Patient denies syncope or presyncope.  I will refer to the advanced heart failure team for further recommendations regarding amyloidosis, LVH, and worsening PRI.  Patient follows with oncology in [place].  First-degree AV block EKG shows sinus bradycardia, 55 bpm, PRI 294 MS.  Hold metoprolol  as above.  May need heart monitor.        Dispo: Follow-up in 1 year with Dr. Gollan  Signed, Eugene Harral VEAR Fishman, PA-C

## 2024-09-28 NOTE — Patient Instructions (Addendum)
 Medication Instructions:  Your physician recommends the following medication changes.  STOP TAKING: Metoprolol   *If you need a refill on your cardiac medications before your next appointment, please call your pharmacy*  Lab Work: Your provider would like for you to have following labs drawn today BMP, CBC, Mg, TSH.     Testing/Procedures: No test ordered today   Follow-Up: At [facility], you and your health needs are our priority.  As part of our continuing mission to provide you with exceptional heart care, our providers are all part of one team.  This team includes your primary Cardiologist (physician) and Advanced Practice Providers or APPs (Physician Assistants and Nurse Practitioners) who all work together to provide you with the care you need, when you need it.  Your next appointment:   1 year(s)  Provider:   Evalene Lunger, MD    Your physician recommends that you schedule a follow-up appointment with Dr. Bensimhon

## 2024-09-29 ENCOUNTER — Ambulatory Visit: Payer: Self-pay | Admitting: Medical

## 2024-09-29 ENCOUNTER — Ambulatory Visit: Admitting: Physical Therapy

## 2024-09-29 DIAGNOSIS — R262 Difficulty in walking, not elsewhere classified: Secondary | ICD-10-CM

## 2024-09-29 DIAGNOSIS — R2681 Unsteadiness on feet: Secondary | ICD-10-CM

## 2024-09-29 DIAGNOSIS — M6281 Muscle weakness (generalized): Secondary | ICD-10-CM

## 2024-09-29 DIAGNOSIS — R2689 Other abnormalities of gait and mobility: Secondary | ICD-10-CM

## 2024-09-29 LAB — BASIC METABOLIC PANEL WITH GFR
BUN/Creatinine Ratio: 22 (ref 10–24)
BUN: 24 mg/dL (ref 8–27)
CO2: 23 mmol/L (ref 20–29)
Calcium: 9.5 mg/dL (ref 8.6–10.2)
Chloride: 101 mmol/L (ref 96–106)
Creatinine, Ser: 1.07 mg/dL (ref 0.76–1.27)
Glucose: 92 mg/dL (ref 70–99)
Potassium: 4.7 mmol/L (ref 3.5–5.2)
Sodium: 136 mmol/L (ref 134–144)
eGFR: 70 mL/min/1.73 (ref >59)

## 2024-09-29 LAB — CBC
Hematocrit: 37.9 % (ref 37.5–51.0)
Hemoglobin: 12.4 g/dL — ABNORMAL LOW (ref 13.0–17.7)
MCH: 31.4 pg (ref 26.6–33.0)
MCHC: 32.7 g/dL (ref 31.5–35.7)
MCV: 96 fL (ref 79–97)
Platelets: 140 x10E3/uL — ABNORMAL LOW (ref 150–450)
RBC: 3.95 x10E6/uL — ABNORMAL LOW (ref 4.14–5.80)
RDW: 13.2 % (ref 11.6–15.4)
WBC: 5.5 x10E3/uL (ref 3.4–10.8)

## 2024-09-29 LAB — TSH: TSH: 1.1 u[IU]/mL (ref 0.450–4.500)

## 2024-09-29 LAB — MAGNESIUM: Magnesium: 2.1 mg/dL (ref 1.6–2.3)

## 2024-09-29 NOTE — Therapy (Signed)
 OUTPATIENT PHYSICAL THERAPY NEURO TREATMENT/ DISCHARGE PT    Patient Name: Eugene Martinez. MRN: [id number] DOB:September 28, 1946, 78 y.o., male Today's Date: 09/29/2024   PCP: Sadie Manna, MD  REFERRING PROVIDER: Caffaro, Kaitlin T, PA-C   END OF SESSION:  PT End of Session - 09/29/24 0901     Visit Number 40    Number of Visits 41    Date for Recertification  09/30/24    Progress Note Due on Visit 50    PT Start Time 0915    PT Stop Time 0958    PT Time Calculation (min) 43 min    Equipment Utilized During Treatment Gait belt    Activity Tolerance Patient tolerated treatment well    Behavior During Therapy WFL for tasks assessed/performed                           Past Medical History:  Diagnosis Date   Allergic state    Amyloidosis (HCC)    Anemia    BPH (benign prostatic hyperplasia)    DDD (degenerative disc disease), lumbar    Dysplastic nevus 10/13/2013   Left posterior waistline paraspinal. Moderate to severe atypia, pattern borders on early evolving MIS, edge involved. Excised 03/31/2014, margins free.   Dysplastic nevus 12/19/2017   Right upper back medial. Mild atypia, lateral and deep margins involved.    Dysplastic nevus 11/28/2022   right prox lat thigh, Severe atypia, Excised 01/08/23   Dysrhythmia    Environmental and seasonal allergies    Gallop rhythm    GERD (gastroesophageal reflux disease)    Hypertension    Mild mitral regurgitation    a. by echo 10/2012   Pain    bil. 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 10/2012 Echo: EF 55-60%, severe LVH with speckling of myocardium -? amyloid. Mild MR, mod dil LA.   Past Surgical History:  Procedure Laterality Date   CIRCUMCISION  1984   COLONOSCOPY  03/2012   COLONOSCOPY WITH PROPOFOL  N/A 10/29/2017   Procedure: COLONOSCOPY WITH PROPOFOL ;  Surgeon: Gaylyn Gladis PENNER, MD;  Location: [facility] ENDOSCOPY;  Service: Endoscopy;  Laterality: N/A;   FOOT SURGERY      right foot   FOOT SURGERY Right 2017   LEG SURGERIES     HX.OF MULTIPLE SURGERIES FOR LEG LENGTH DUE TO POLIO   LUMBAR LAMINECTOMY/DECOMPRESSION MICRODISCECTOMY N/A 01/26/2019   Procedure: LUMBAR LAMINECTOMY/DECOMPRESSION MICRODISCECTOMY 1 LEVEL L3-4;  Surgeon: Bluford Standing, MD;  Location: [facility] ORS;  Service: Neurosurgery;  Laterality: N/A;   NECK SURGERY     PILONIDAL CYST / SINUS EXCISION     Patient Active Problem List   Diagnosis Date Noted   Ambulatory dysfunction 10/22/2023   Prediabetes 02/05/2023   Anemia 08/01/2020   Elevated hemoglobin A1c 08/01/2020   Loss of memory 08/01/2020   Sesamoiditis 06/25/2019   S/P lumbar laminectomy 01/26/2019   Weakness of both lower limbs 10/24/2018   LVH (left ventricular hypertrophy) 08/29/2016   Allergy 02/20/2016   Amyloidosis (HCC) 02/20/2016   Thrombocytopenia 11/23/2015   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   Airway hyperreactivity 05/07/2013   Benign fibroma of prostate 05/07/2013   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   Chronic diastolic heart failure (HCC) 11/18/2012   Proteinuria, Bence Jones 11/18/2012   Multiple allergies 10/29/2012   Hypertension 10/29/2012   RBBB (right bundle branch block with left anterior fascicular block) 10/29/2012  Proteinuria 10/29/2012    ONSET DATE: 04/22/24  REFERRING DIAG: R26.9 (ICD-10-CM) - Altered gait   THERAPY DIAG:  Difficulty in walking, not elsewhere classified  Unsteadiness on feet  Other abnormalities of gait and mobility  Muscle weakness (generalized)  Rationale for Evaluation and Treatment: Rehabilitation  SUBJECTIVE:                                                                                                                                                                                             SUBJECTIVE STATEMENT:   Pt reports doing okay without any new issues. States trying to workout more at home. Is sad last day of PT is here but is comfortable with  exercises at home.   From Eval:Pt wife report he has been inconsistent with exercise since discharge. She also assist with all of subjective. Pt has been wall and furniture surfing to get around home. Pt had one pseudo fall in last 6 months; he went on a 4's into the recycling can but could not get up. Pt has not been following trough with his exercise. Pt has been using the cane almost all of the time outside of the house in comparison to rollator.  Patient and caregiver want to work on general strength and mobility. Pt caregiver has some concerns with getting rollator into and out of the car.  As well as getting patient into and out of the car.  Pt accompanied by: significant other  PERTINENT HISTORY: . 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 No  PRECAUTIONS: Fall  RED FLAGS: None   WEIGHT BEARING RESTRICTIONS: No  FALLS: Has patient fallen in last 6 months? No fall but went to ground intentionally and could not get up   LIVING ENVIRONMENT: LIVING ENVIRONMENT: Lives with: lives with their spouse Lives in: House/apartment Stairs: 1 step into home Has following equipment at home: Retail banker - 2 wheeled *On Arts development officer for [employer] at [employer]  PLOF: Independent with household mobility with device and Independent with community mobility with device  PATIENT GOALS: Improve balance and mobility   OBJECTIVE:  Note: Objective measures were completed at Evaluation unless otherwise noted.   COGNITION: Overall cognitive status: Impaired and Mild cogintive impairment     POSTURE: rounded shoulders, forward head, and increased thoracic kyphosis  LOWER EXTREMITY ROM:   WNL for tasks assessed    LOWER EXTREMITY MMT:    MMT Right Eval Left Eval  Hip flexion 4 4  Hip  extension    Hip abduction 4+ 4+  Hip adduction 4+  4+  Hip internal rotation    Hip external rotation    Knee flexion 4- 4-  Knee extension 4 4  Ankle dorsiflexion -   Ankle plantarflexion -   Ankle inversion    Ankle eversion    (Blank rows = not tested)  BED MOBILITY:  Not tested  TRANSFERS: Sit to stand: Complete Independence and on a few reps noted instability in standing  Assistive device utilized: None     Stand to sit: Complete Independence  Assistive device utilized: None      GAIT: Findings: Gait Characteristics: With cane pt does not use in classic 2 or 3 point pattern but uses sporadically, decreased step length- Right, and decreased step length- Left, Distance walked: 30 ft, Assistive device utilized:Single point cane, Level of assistance: CGA, and Comments: Describing gait with SPC in R UE   FUNCTIONAL TESTS:  5 times sit to stand: 15.55 sec, imbalance on a few Timed up and go (TUG): 22.62 sec with cane  6 minute walk test: Test visit 2  10 meter walk test: 22.67 sec with cane, 13.6 sec with rollator   Berg Balance Scale: Test visit 2    PATIENT SURVEYS:  LEFS  Extreme difficulty/unable (0), Quite a bit of difficulty (1), Moderate difficulty (2), Little difficulty (3), No difficulty (4) Survey date:  WIFE FILLS OUT 05/12/24  Any of your usual work, housework or school activities 3  2. Usual hobbies, recreational or sporting activities 2  3. Getting into/out of the bath 4  4. Walking between rooms 3  5. Putting on socks/shoes 2  6. Squatting  1  7. Lifting an object, like a bag of groceries from the floor 3  8. Performing light activities around your home 3  9. Performing heavy activities around your home 2  10. Getting into/out of a car 1  11. Walking 2 blocks 2  12. Walking 1 mile 1  13. Going up/down 10 stairs (1 flight) 1  14. Standing for 1 hour 2  15.  sitting for 1 hour 4  16. Running on even ground 1  17. Running on uneven ground 1  18. Making sharp turns while running fast 1  19. Hopping  0  20.  Rolling over in bed 2  Score total:  40                                                                                                                                 TREATMENT DATE: 09/29/24   TA- To improve functional movements patterns for everyday tasks as well as improve gait mechanics.   Gait with 3# AW x 320 ft cues for large step lengt and posture   Access Code: [email or web address] URL: [email or web address] Date: 09/29/2024 Prepared by: Lonni Gainer  Exercises - Sit to Stand Without Arm Support  2 sets - 10 reps -  Mini Squat with Counter Support  2 sets - 15 reps - Marching Near Counter  - 2 sets - 15 reps - Sidestepping near counter  - 2 sets - 10 reps - Wide tandem with counter support as needed   - 3 sets - 30 sec hold  PATIENT EDUCATION: Education details: Pt educated throughout session about proper posture and technique with exercises. Improved exercise technique, movement at target joints, use of target muscles after min to mod verbal, visual, tactile cues. Person educated: Patient Education method: Explanation Education comprehension: verbalized understanding   HOME EXERCISE PROGRAM:    GOALS: :Goals reviewed with patient? Yes  SHORT TERM GOALS: Target date: 06/09/2024   Patient will be independent in home exercise program to improve strength/mobility for better functional independence with ADLs. Baseline: No HEP currently 8/11: Diligently completing HEP  Goal status: MET  LONG TERM GOALS: Target date: 08/04/2024  1.  Patient will complete five times sit to stand test in < 15 seconds without LOB indicating an increased LE strength and improved balance. Baseline: 15.55 sec, imbalance on a few reps requiring CGA for safety 7/7:14.11 sec 8/11:15.62 sec no UE 8/20:16.86 sec no UE support 8/27:13.95 sec no UE support Goal status: MET  2.  Patient will improve LEFS score to 50   to demonstrate statistically significant improvement in mobility and  quality of life as it relates to their LE strength and mobility.  Baseline: 40 7/7:42 8/27:41 9/16:48 Goal status: ONGOING   3.  Patient will increase Berg Balance score by > 6 points to demonstrate decreased fall risk during functional activities. Baseline: 38 8/11:43 8/27: 46 Goal status: MET   4.   Patient will reduce timed up and go to <11 seconds to reduce fall risk and demonstrate improved transfer/gait ability. Baseline: 22.62 sec with SPC  8/11:15.39 sec with 4WW 8/27: 15.1 sec with RW 14.7 sec with no AD 9/16:14.73 sec with 4WW 16.2 sec no AD Goal status: ONGOING  5.   Patient will increase 10 meter walk test to >1.21m/s as to improve gait speed for better community ambulation and to reduce fall risk. Baseline: .44 m/s w/ SPC, .73 m/s with rollator .75 m/s with rollator 9/16: .78 m/s with 4WW .83 m/s Goal status: ONGOING  6.   Patient will increase six minute walk test distance by 150 ft or greater for progression to community ambulator and improve gait ability Baseline: progress note 7/7:750 ft, increased shuffling style gait from minute 2 and on as well as increased trunk flexion from this point on as well. 8/11: 750 ft with 4WW, improved gait quality ( no shuffling until last minute) and improved trunk flexion posture. Similar speed but less fatigue 8/27: 917 ft with 4WW; flexed posture dominates gait and causing shuffling last 1.5 min  Goal status: MET    ASSESSMENT:  CLINICAL IMPRESSION:    Pt presents for discharge. Pt has met or nearly met all therapy goals and reached therapeutic potential.  Patient comfortable with home exercise program and is performing exercises regularly per his report.  Patient will be formally discharged  from skilled physical therapy at this date with an exercise program in place.  OBJECTIVE IMPAIRMENTS: Abnormal gait, decreased activity tolerance, decreased balance, decreased endurance, decreased knowledge of condition, decreased knowledge of  use of DME, and decreased strength.   ACTIVITY LIMITATIONS: standing, squatting, stairs, transfers, and locomotion level  PARTICIPATION LIMITATIONS: meal prep, shopping, and community activity  PERSONAL FACTORS: Age, Behavior pattern, and 3+ comorbidities:  HTN, Chronic Diastolic Heart Failure, post polio,  are also affecting patient's functional outcome.   REHAB POTENTIAL: Good  CLINICAL DECISION MAKING: Evolving/moderate complexity  EVALUATION COMPLEXITY: Moderate  PLAN:  PT FREQUENCY: 2x/week  PT DURATION: 12 weeks  PLANNED INTERVENTIONS: 97750- Physical Performance Testing, 97110-Therapeutic exercises, 97530- Therapeutic activity, [id number]- Neuromuscular re-education, 97535- Self Care, 02859- Manual therapy, and 97116- Gait training  PLAN FOR NEXT SESSION:   Reassess all remaining goals Finalize HEP  Discharge to HEP as appropriate.  Note: Portions of this document were prepared using Dragon voice recognition software and although reviewed may contain unintentional dictation errors in syntax, grammar, or spelling.  Lonni KATHEE Gainer PT  Physical Therapist- [facility]  [facility]   9:38 AM 09/29/24

## 2024-10-01 ENCOUNTER — Ambulatory Visit: Admitting: Physical Therapy

## 2024-10-06 ENCOUNTER — Ambulatory Visit: Admitting: Physical Therapy

## 2024-10-08 ENCOUNTER — Ambulatory Visit: Admitting: Physical Therapy

## 2024-10-09 ENCOUNTER — Telehealth: Payer: Self-pay | Admitting: Internal Medicine

## 2024-10-09 ENCOUNTER — Other Ambulatory Visit: Payer: Self-pay | Admitting: Cardiovascular Disease

## 2024-10-09 NOTE — Telephone Encounter (Signed)
 Called to confirm/remind patient of their appointment at the [facility] on 10/12/24.   Appointment:   [x] Confirmed  [] Left mess   [] No answer/No voice mail  [] VM Full/unable to leave message  [] Phone not in service  Patient reminded to bring all medications and/or complete list.  Confirmed patient has transportation. Gave directions, instructed to utilize valet parking.

## 2024-10-12 ENCOUNTER — Ambulatory Visit (HOSPITAL_COMMUNITY)
Admission: RE | Admit: 2024-10-12 | Discharge: 2024-10-12 | Disposition: A | Source: Ambulatory Visit | Attending: Internal Medicine | Admitting: Internal Medicine

## 2024-10-12 ENCOUNTER — Ambulatory Visit: Attending: Internal Medicine | Admitting: Internal Medicine

## 2024-10-12 ENCOUNTER — Other Ambulatory Visit (HOSPITAL_COMMUNITY): Payer: Self-pay | Admitting: Internal Medicine

## 2024-10-12 VITALS — BP 112/68 | HR 55 | Wt 167.4 lb

## 2024-10-12 DIAGNOSIS — I517 Cardiomegaly: Secondary | ICD-10-CM | POA: Insufficient documentation

## 2024-10-12 DIAGNOSIS — E8581 Light chain (AL) amyloidosis: Secondary | ICD-10-CM | POA: Diagnosis not present

## 2024-10-12 DIAGNOSIS — I440 Atrioventricular block, first degree: Secondary | ICD-10-CM

## 2024-10-12 DIAGNOSIS — Z8612 Personal history of poliomyelitis: Secondary | ICD-10-CM | POA: Diagnosis not present

## 2024-10-12 DIAGNOSIS — Z9221 Personal history of antineoplastic chemotherapy: Secondary | ICD-10-CM | POA: Diagnosis not present

## 2024-10-12 NOTE — Patient Instructions (Addendum)
 Medication Changes:  No medication changes today!  Lab Work:  Go downstairs to [facility] on LOWER LEVEL to have your blood work completed.  We will only call you if the results are abnormal or if the provider would like to make medication changes.  No news is good news.   Your provider has recommended that  you wear a Zio Patch for 14 days.  This monitor will record your heart rhythm for our review.  IF you have any symptoms while wearing the monitor please press the button.  If you have any issues with the patch or you notice a red or orange light on it please call the company at [phone].  Once you remove the patch please mail it back to the company as soon as possible so we can get the results.   Follow-Up in: Your physician recommends that you follow up with the [facility] as needed. No need to make an appointment today! Just give us  a call if you feel you need to be seen.    Thank you for choosing [facility].    At the [facility], you and your health needs are our priority. We have a designated team specialized in the treatment of Heart Failure. This Care Team includes your primary Heart Failure Specialized Cardiologist (physician), Advanced Practice Providers (APPs- Physician Assistants and Nurse Practitioners), and Pharmacist who all work together to provide you with the care you need, when you need it.   You may see any of the following providers on your designated Care Team at your next follow up:  Dr. Toribio Fuel Dr. Ezra Shuck Dr. Ria Commander Dr. Morene Brownie Ellouise Class, FNP Jaun Bash, RPH-CPP  Please be sure to bring in all your medications bottles to every appointment.   Need to Contact Us :  If you have any questions or concerns before your next appointment please send us  a message through [place] or call our office at [phone].    TO LEAVE A MESSAGE FOR THE  NURSE SELECT OPTION 2, PLEASE LEAVE A MESSAGE INCLUDING: YOUR NAME DATE OF BIRTH CALL BACK NUMBER REASON FOR CALL**this is important as we prioritize the call backs  YOU WILL RECEIVE A CALL BACK THE SAME DAY AS LONG AS YOU CALL BEFORE 4:00 PM

## 2024-10-12 NOTE — Progress Notes (Signed)
 Zio patch placed onto patient.  All instructions and information reviewed with patient, they verbalize understanding with no questions.

## 2024-10-12 NOTE — Progress Notes (Signed)
 [facility] Note   Referring Physician:Furth, Cadence, PA PCP: Sadie Manna, MD Cardiologist: Evalene Lunger, MD   Chief Complaint: 1st degree AV block. H/o AL cardiac amyloid.    HPI:  Eugene Martinez is a 78 y/o male who was kindly referred by [facility] cardiology for evaluation of AL amyloidosis. 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  Diagnosed with AL amyloid in 2013. Underwent curative treatment. Has been followed routinely by Oncology (Dr. Cloretta) with normal 24 hour urines and FLC. Last seen 4/25   cMRI 2013: EF 66%, normal RV, suggestive of cardiac amyloidosis.   Echo 10/2023 showed LVEF 60 to 65%, no wall motion abnormalities, moderate LVH with severe LVH of the inferior/posterior lateral walls, grade 1 diastolic dysfunction, mildly dilated left atrium, trivial Eugene, borderline dilation of the ascending aorta measuring 37 mm.   He saw cardiology 09/28/24 who noted a worsening PRI of 294 ms. Metoprolol  was held. He was referred here  He presents today with his wife.   Denies CP or any HF symptoms. Does ADLs without problem. No edema, orthopnea or PND. No syncope or presyncope.    Past Medical History:  Diagnosis Date   Allergic state    Amyloidosis (HCC)    Anemia    BPH (benign prostatic hyperplasia)    DDD (degenerative disc disease), lumbar    Dysplastic nevus 10/13/2013   Left posterior waistline paraspinal. Moderate to severe atypia, pattern borders on early evolving MIS, edge involved. Excised 03/31/2014, margins free.   Dysplastic nevus 12/19/2017   Right upper back medial. Mild atypia, lateral and deep margins involved.    Dysplastic nevus 11/28/2022   right prox lat thigh, Severe atypia, Excised 01/08/23   Dysrhythmia    Environmental and  seasonal allergies    Gallop rhythm    GERD (gastroesophageal reflux disease)    Hypertension    Mild mitral regurgitation    a. by echo 10/2012   Pain    bil. 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 10/2012 Echo: EF 55-60%, severe LVH with speckling of myocardium -? amyloid. Mild Eugene, mod dil LA.    Current Outpatient Medications  Medication Sig Dispense Refill   aspirin  81 MG chewable tablet Chew 81 mg by mouth daily.     azelastine  (ASTELIN ) 137 MCG/SPRAY nasal spray Place 1-2 sprays into both nostrils every 12 (twelve) hours as needed (allergies).     colestipol (COLESTID) 1 g tablet Take 2 g by mouth 2 (two) times daily.     donepezil (ARICEPT) 10 MG tablet Take 10 mg by mouth at bedtime.     escitalopram (LEXAPRO) 5 MG tablet Take 5 mg by mouth daily.     ferrous sulfate 325 (65 FE) MG tablet Take 325 mg by mouth daily.     finasteride  (PROSCAR ) 5 MG tablet TAKE 1 TABLET BY MOUTH EVERY DAY     memantine (NAMENDA) 5 MG tablet Take 5 mg by mouth 2 (two) times daily.     Misc Natural Products (NEURIVA PO) Take 1 tablet by mouth daily at 6 (six) AM.     Multiple Vitamins-Minerals (AIRBORNE PO) Take 1 tablet by mouth daily.     Omega-3 Fatty Acids (FISH OIL) 1200 MG CAPS  Take 1,200 mg by mouth 2 (two) times daily.     triamcinolone cream (KENALOG) 0.1 % Apply 1 application topically daily as needed (skin rash).   2   trolamine salicylate (ASPERCREME) 10 % cream Apply 1 Application topically as needed for muscle pain. Calves of legs     vitamin E 400 UNIT capsule Take 400 Units by mouth every morning.      omeprazole (PRILOSEC) 20 MG capsule Take 20 mg by mouth every morning.     No current facility-administered medications for this visit.    Allergies  Allergen Reactions   Other Other (See Comments)    Seasonal allergies/grass/trees/dust causes watery eyes, stuffiness, sometimes swelling.      Social History   Socioeconomic History   Marital status:  Married    Spouse name: Not on file   Number of children: Not on file   Years of education: Not on file   Highest education level: Not on file  Occupational History   Not on file  Tobacco Use   Smoking status: Never   Smokeless tobacco: Never  Vaping Use   Vaping status: Never Used  Substance and Sexual Activity   Alcohol  use: No   Drug use: No   Sexual activity: Yes    Birth control/protection: None  Other Topics Concern   Not on file  Social History Narrative   Not on file   Social Drivers of Health   Financial Resource Strain: Low Risk  (08/03/2024)   Received from [facility]   Overall Financial Resource Strain (CARDIA)    Difficulty of Paying Living Expenses: Not hard at all  Food Insecurity: No Food Insecurity (08/03/2024)   Received from [facility]   Hunger Vital Sign    Within the past 12 months, you worried that your food would run out before you got the money to buy more.: Never true    Within the past 12 months, the food you bought just didn't last and you didn't have money to get more.: Never true  Transportation Needs: No Transportation Needs (08/03/2024)   Received from [facility] - Transportation    In the past 12 months, has lack of transportation kept you from medical appointments or from getting medications?: No    Lack of Transportation (Non-Medical): No  Physical Activity: Not on file  Stress: Not on file  Social Connections: Not on file  Intimate Partner Violence: Not on file      Family History  Problem Relation Age of Onset   Heart attack Father     Vitals:   10/12/24 1356  BP: 112/68  Pulse: (!) 55  SpO2: 98%  Weight: 167 lb 6 oz (75.9 kg)   Lab Results  Component Value Date   CREATININE 1.07 09/28/2024   CREATININE 0.98 04/08/2024   CREATININE 0.91 06/21/2023     PHYSICAL EXAM: General:  Elderly  No respiratory difficulty HEENT: normal Neck: supple. no JVD.  Carotids 2+ bilat; no bruits. No lymphadenopathy or thyromegaly appreciated. Cor: PMI nondisplaced. Regular rate & rhythm. No rubs, gallops or murmurs. Lungs: clear Abdomen: soft, nontender, nondistended. No hepatosplenomegaly. No bruits or masses. Good bowel sounds. Extremities: no cyanosis, clubbing, rash, edema Neuro: alert & oriented x 3, cranial nerves grossly intact. moves all 4 extremities w/o difficulty. Affect pleasant.  ECG today 10/12/24: sinus brady with progressive 1HB at 312 ms (was ) RBBB and LAFB. (Bifascicular block) + PACs Personally  reviewed  ECG 09/2023: Sinus brady 1HB RBBB LAFB    ASSESSMENT & PLAN:  1. 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 Given the fact that he has been in remission from AL amyloid for 10 years I doubt this is related to an infiltrative process though I guess it is possible that he could develop TTR amyloid, That said I think that this is more likely age-related progression  - will check SPEP and serum FLC for completeness sake.  - place Zio to further evaluate conduction disease and need for possible PPM (He will almost certainly need one in future but doubt he is there yet) - Avoid all AV nodal blockers  2. H/o AL cardiac amyloidosis - lambda light chain, diagnosed in December 2013-most recently treated with every 2 week Velcade /Decadron , last given 03/19/2014    - cMRI 2013: EF 66%, normal RV, suggestive of cardiac amyloidosis.  - Echo 10/2023 showed LVEF 60 to 65%, no wall motion abnormalities, moderate LVH with severe LVH of the inferior/posterior lateral walls, grade 1 diastolic dysfunction, mildly dilated left atrium, trivial Eugene, borderline dilation of the ascending aorta measuring 37 mm.   Toribio Fuel, MD  5:41 PM

## 2024-10-13 ENCOUNTER — Ambulatory Visit: Admitting: Physical Therapy

## 2024-10-14 LAB — PROTEIN ELECTROPHORESIS, SERUM
A/G Ratio: 1.4 (ref 0.7–1.7)
Albumin ELP: 3.8 g/dL (ref 2.9–4.4)
Alpha 1: 0.2 g/dL (ref 0.0–0.4)
Alpha 2: 0.7 g/dL (ref 0.4–1.0)
Beta: 1 g/dL (ref 0.7–1.3)
Gamma Globulin: 1 g/dL (ref 0.4–1.8)
Globulin, Total: 2.8 g/dL (ref 2.2–3.9)
Total Protein: 6.6 g/dL (ref 6.0–8.5)

## 2024-10-14 LAB — KAPPA/LAMBDA LIGHT CHAINS
Ig Kappa Free Light Chain: 20.6 mg/L — ABNORMAL HIGH (ref 3.3–19.4)
Ig Lambda Free Light Chain: 22.9 mg/L (ref 5.7–26.3)
KAPPA/LAMBDA RATIO: 0.9 (ref 0.26–1.65)

## 2024-10-15 ENCOUNTER — Ambulatory Visit: Admitting: Physical Therapy

## 2024-10-20 ENCOUNTER — Ambulatory Visit: Admitting: Physical Therapy

## 2024-10-21 ENCOUNTER — Ambulatory Visit: Admitting: Podiatry

## 2024-10-22 ENCOUNTER — Ambulatory Visit: Admitting: Physical Therapy

## 2024-10-27 ENCOUNTER — Ambulatory Visit: Admitting: Physical Therapy

## 2024-10-28 ENCOUNTER — Ambulatory Visit

## 2024-10-28 ENCOUNTER — Ambulatory Visit: Admitting: Podiatry

## 2024-10-28 DIAGNOSIS — M2042 Other hammer toe(s) (acquired), left foot: Secondary | ICD-10-CM

## 2024-10-28 DIAGNOSIS — D239 Other benign neoplasm of skin, unspecified: Secondary | ICD-10-CM

## 2024-10-29 ENCOUNTER — Ambulatory Visit

## 2024-10-29 NOTE — Progress Notes (Signed)
 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  Objective: Pulses are palpable benign skin lesion right foot.  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  We will follow-up with him next time.

## 2024-11-03 ENCOUNTER — Ambulatory Visit: Admitting: Physical Therapy

## 2024-11-04 ENCOUNTER — Telehealth: Payer: Self-pay | Admitting: Physician Assistant

## 2024-11-04 DIAGNOSIS — I440 Atrioventricular block, first degree: Secondary | ICD-10-CM | POA: Diagnosis not present

## 2024-11-04 NOTE — Addendum Note (Signed)
 Encounter addended by: Debarah Garrison MATSU, RN on: 11/04/2024 3:12 PM  Actions taken: Imaging Exam ended

## 2024-11-04 NOTE — Telephone Encounter (Signed)
 Received notification from Dr. Cherrie regarding abnormal heart monitor with numerous pauses, longest 9.5 seconds. He called and spoke with patient who does not wish to go to the hospital at this time. The patient denied any symptoms. Dr. Bensimhon relayed general precautions around activity as well as 911 precautions. He felt the patient would benefit from ASAP EP evaluation and pacemaker, possibly Friday. Since office is not open tomorrow or Friday, unable to schedule EP clinic appointment to help get the ball rolling for placement on Friday. Since patient unwilling to come to the hospital today or tomorrow, the next best option would be for him to present to the ED first thing Friday morning, and nothing to eat or drink after midnight at that time. Unfortunately the patient and his wife are not keen on this either as they have out of town guests arriving for the holiday. I stressed the nature of the abnormal heart rhythm and the dangers of disregarding the result / postponing further management. However, they will give it some thought tonight. Will send this msg to the cardmaster inbox covered by our holiday APP team to review with the covering electrophysiologists tomorrow to help navigate trying to accomplish this, possibly for Friday, if patient willing to come to the hospital at that time. Patient will need call tomorrow to discuss next steps. Will also cc to [facility] per Dr. Nelle request.

## 2024-11-05 NOTE — Telephone Encounter (Signed)
 Previous message reviewed, patient refused to come to the hospital yesterday or today.  It was offered to have him go to the emergency room on Friday to potentially undergo the pacemaker implantation on Friday, however patient and wife did not wish to do that either.  I ended up reviewed his case with Dr. Kennyth EP attending.  Dr. Kennyth currently have 5 cases tomorrow, very unlikely to do his pacemaker tomorrow.  Although, there is small chance Dr. Kennyth may be able to do a pacemaker this weekend if he is willing to come to the [facility] emergency room tomorrow.  However the ability to do the pacemaker is not guaranteed on the weekend, there is a small chance it may not get done until early next week.  I tried to discuss the case with the patient by calling his cell phone twice, I have been unable to reach out to the patient.

## 2024-11-08 NOTE — Telephone Encounter (Signed)
 Eugene Martinez. This patient never came to the hospital. I can't remember who you usually reach out to schedule urgent EP things (I think it is Cassie but not 100% sure). Can you see if she can get this patient in to see one of the EP MDs ASAP?  Thank you so much! Gavyn Ybarra

## 2024-11-09 ENCOUNTER — Ambulatory Visit: Admitting: Podiatry

## 2024-11-09 NOTE — Progress Notes (Unsigned)
 Electrophysiology Office Note:   Date:  11/10/2024  ID:  Eugene Martinez., DOB 08-20-1946, MRN [id number]  Primary Cardiologist: Eugene Lunger, MD Electrophysiologist: None      History of Present Illness:   Eugene Martinez. is a 78 y.o. male with h/o AL amyloidosis with cardiac involvement, h/o polio with right LE brace who is being seen today for evaluation for pacemaker at the request of Dr. Cherrie.  Discussed the use of AI scribe software for clinical note transcription with the patient, who gave verbal consent to proceed.  History of Present Illness Eugene Martinez is an 78 year old male with amyloidosis who presents for evaluation of heart block and consideration of a pacemaker. He was referred by Dr. Odis Martinez for evaluation of heart block and consideration of a pacemaker.  He has a history of amyloidosis affecting his heart muscle. A recent heart monitor recorded 46 episodes of pauses in heartbeats, some lasting up to nine seconds, over a two-week period. He is not sure of any dizziness, lightheadedness, or presyncope that correlated with these episodes but activity is limited at times.  He does not drive due to cognitive issues and uses a walker for mobility. 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 He has a history of polio contributing to his mobility issues. His wife mentions he sometimes requires assistance to get up from the floor, as she is unable to help due to her own back problems.  He has a family history of heart issues; his father had a pacemaker 30-40 years ago.   Review of systems complete and found to be negative unless listed in HPI.   EP Information / Studies Reviewed:    EKG is ordered today. Personal review as below.  EKG Interpretation Date/Time:  Tuesday November 10 2024 10:45:32 EST Ventricular Rate:  51 PR Interval:  258 QRS Duration:  164 QT Interval:  496 QTC Calculation: 457 R  Axis:   -72  Text Interpretation: Sinus bradycardia with 1st degree A-V block Right bundle branch block Left anterior fascicular block Bifascicular block Septal infarct (cited on or before 12-Jan-2019) T wave abnormality, consider lateral ischemia When compared with ECG of 12-Oct-2024 15:05, Premature supraventricular complexes are no longer Present Questionable change in initial forces of Septal leads T wave amplitude has decreased in Inferior leads T wave inversion more evident in Lateral leads Confirmed by Eugene Martinez [id number]) on 11/10/2024 10:46:41 AM   Zio 10/2024   Echo 10/15/23:   1. Left ventricular ejection fraction, by estimation, is 60 to 65%. The  left ventricle has normal function. The left ventricle has no regional  wall motion abnormalities. There is moderate left ventricular hypertrophy  with severe LVH of the  inferior/posterior and lateral walls. Left ventricular diastolic  parameters are consistent with Grade I diastolic dysfunction (impaired  relaxation). The average left ventricular global longitudinal strain is  -20.4 %.   2. Right ventricular systolic function is normal. The right ventricular  size is normal. Tricuspid regurgitation signal is inadequate for assessing  PA pressure.   3. Left atrial size was mildly dilated.   4. The mitral valve is normal in structure. Trivial mitral valve  regurgitation. No evidence of mitral stenosis. Moderate mitral annular  calcification.   5. The aortic valve is tricuspid. Aortic valve regurgitation is not  visualized. No aortic stenosis is present.   6. There is borderline dilatation of the ascending aorta, measuring 37  mm.  7. The inferior vena cava is normal in size with greater than 50%  respiratory variability, suggesting right atrial pressure of 3 mmHg.    Physical Exam:   VS:  BP 120/65 (BP Location: Left Arm, Patient Position: Sitting, Cuff Size: Normal)   Pulse (!) 51 Comment: 55 oximeter  Ht 5' 6 (1.676 m)    Wt 163 lb 3.2 oz (74 kg)   SpO2 98%   BMI 26.34 kg/m    Wt Readings from Last 3 Encounters:  11/10/24 163 lb 3.2 oz (74 kg)  10/12/24 167 lb 6 oz (75.9 kg)  09/28/24 164 lb (74.4 kg)     General: Well developed, in no acute distress.  Neck: No JVD.  Cardiac: Bradycardic, regular rhythm.  Resp: Normal work of breathing.  Ext: No edema.  Neuro: Lower extremity weakness Psych: Normal affect.   ASSESSMENT AND PLAN:    #Complete heart block: Intermittent. Referred for pacemaker at request of Dr. Cherrie, his HF physician. #Baseline advanced conduction disease: RBBB, LAFB, first degree AV delay. -Patient meets criteria for permanent pacemaker implant in setting of complete heart block and advanced conduction disease. Explained risks, benefits, and alternatives to pacemaker implantation, including but not limited to bleeding, infection, damage to heart or lungs, heart attack, stroke, or death.  Pt verbalized understanding and agrees to proceed.  #H/o AL cardiac amyloidosis: Lambda light chain, diagnosed in December 2013-most recently treated with every 2 week Velcade /Decadron , last given 03/19/2014  -Most recent LVEF normal.  -Continue close follow up with HF team.    Follow up with EP Team 90 days after implant.   Signed, Eugene Kitty, MD

## 2024-11-09 NOTE — H&P (View-Only) (Signed)
 Electrophysiology Office Note:   Date:  11/10/2024  ID:  Eugene Martinez., DOB Sep 10, 1946, MRN [id number]  Primary Cardiologist: Evalene Lunger, MD Electrophysiologist: None      History of Present Illness:   Eugene Martinez. is a 78 y.o. male with h/o AL amyloidosis with cardiac involvement, h/o polio with right LE brace who is being seen today for evaluation for pacemaker at the request of Dr. Cherrie.  Discussed the use of AI scribe software for clinical note transcription with the patient, who gave verbal consent to proceed.  History of Present Illness Eugene Martinez. Verline is an 78 year old male with amyloidosis who presents for evaluation of heart block and consideration of a pacemaker. He was referred by Dr. Odis Savant for evaluation of heart block and consideration of a pacemaker.  He has a history of amyloidosis affecting his heart muscle. A recent heart monitor recorded 46 episodes of pauses in heartbeats, some lasting up to nine seconds, over a two-week period. He is not sure of any dizziness, lightheadedness, or presyncope that correlated with these episodes but activity is limited at times.  He does not drive due to cognitive issues and uses a walker for mobility. 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 He has a history of polio contributing to his mobility issues. His wife mentions he sometimes requires assistance to get up from the floor, as she is unable to help due to her own back problems.  He has a family history of heart issues; his father had a pacemaker 30-40 years ago.   Review of systems complete and found to be negative unless listed in HPI.   EP Information / Studies Reviewed:    EKG is ordered today. Personal review as below.  EKG Interpretation Date/Time:  Tuesday November 10 2024 10:45:32 EST Ventricular Rate:  51 PR Interval:  258 QRS Duration:  164 QT Interval:  496 QTC Calculation: 457 R  Axis:   -72  Text Interpretation: Sinus bradycardia with 1st degree A-V block Right bundle branch block Left anterior fascicular block Bifascicular block Septal infarct (cited on or before 12-Jan-2019) T wave abnormality, consider lateral ischemia When compared with ECG of 12-Oct-2024 15:05, Premature supraventricular complexes are no longer Present Questionable change in initial forces of Septal leads T wave amplitude has decreased in Inferior leads T wave inversion more evident in Lateral leads Confirmed by Kennyth Chew [phone]) on 11/10/2024 10:46:41 AM   Zio 10/2024   Echo 10/15/23:   1. Left ventricular ejection fraction, by estimation, is 60 to 65%. The  left ventricle has normal function. The left ventricle has no regional  wall motion abnormalities. There is moderate left ventricular hypertrophy  with severe LVH of the  inferior/posterior and lateral walls. Left ventricular diastolic  parameters are consistent with Grade I diastolic dysfunction (impaired  relaxation). The average left ventricular global longitudinal strain is  -20.4 %.   2. Right ventricular systolic function is normal. The right ventricular  size is normal. Tricuspid regurgitation signal is inadequate for assessing  PA pressure.   3. Left atrial size was mildly dilated.   4. The mitral valve is normal in structure. Trivial mitral valve  regurgitation. No evidence of mitral stenosis. Moderate mitral annular  calcification.   5. The aortic valve is tricuspid. Aortic valve regurgitation is not  visualized. No aortic stenosis is present.   6. There is borderline dilatation of the ascending aorta, measuring 37  mm.  7. The inferior vena cava is normal in size with greater than 50%  respiratory variability, suggesting right atrial pressure of 3 mmHg.    Physical Exam:   VS:  BP 120/65 (BP Location: Left Arm, Patient Position: Sitting, Cuff Size: Normal)   Pulse (!) 51 Comment: 55 oximeter  Ht 5' 6 (1.676 m)    Wt 163 lb 3.2 oz (74 kg)   SpO2 98%   BMI 26.34 kg/m    Wt Readings from Last 3 Encounters:  11/10/24 163 lb 3.2 oz (74 kg)  10/12/24 167 lb 6 oz (75.9 kg)  09/28/24 164 lb (74.4 kg)     General: Well developed, in no acute distress.  Neck: No JVD.  Cardiac: Bradycardic, regular rhythm.  Resp: Normal work of breathing.  Ext: No edema.  Neuro: Lower extremity weakness Psych: Normal affect.   ASSESSMENT AND PLAN:    #Complete heart block: Intermittent. Referred for pacemaker at request of Dr. Cherrie, his HF physician. #Baseline advanced conduction disease: RBBB, LAFB, first degree AV delay. -Patient meets criteria for permanent pacemaker implant in setting of complete heart block and advanced conduction disease. Explained risks, benefits, and alternatives to pacemaker implantation, including but not limited to bleeding, infection, damage to heart or lungs, heart attack, stroke, or death.  Pt verbalized understanding and agrees to proceed.  #H/o AL cardiac amyloidosis: Lambda light chain, diagnosed in December 2013-most recently treated with every 2 week Velcade /Decadron , last given 03/19/2014  -Most recent LVEF normal.  -Continue close follow up with HF team.    Follow up with EP Team 90 days after implant.   Signed, Fonda Kitty, MD

## 2024-11-09 NOTE — Telephone Encounter (Signed)
 Secure chat received from Waddell Donath, [place] today (12/1) regarding getting patient scheduled for urgent PPM eval following the heart monitor results from Dr. Bensimhon. Outreach made to patient, spoke w/ patients wife Niels. Let her know why I was calling, and the appointment available next Tuesday, 12/9 for her and the patient to come discuss PPM Eval with Dr. Kennyth. She was happy to take the appointment and thankful for the call. Patient is scheduled to see Dr. Kennyth in [place] on 12/9.

## 2024-11-09 NOTE — Telephone Encounter (Signed)
Appointment has been moved to 12/2

## 2024-11-10 ENCOUNTER — Other Ambulatory Visit: Payer: Self-pay

## 2024-11-10 ENCOUNTER — Encounter: Payer: Self-pay | Admitting: Cardiology

## 2024-11-10 ENCOUNTER — Ambulatory Visit: Attending: Cardiology | Admitting: Cardiology

## 2024-11-10 ENCOUNTER — Ambulatory Visit: Admitting: Physical Therapy

## 2024-11-10 VITALS — BP 120/65 | HR 51 | Ht 66.0 in | Wt 163.2 lb

## 2024-11-10 DIAGNOSIS — I444 Left anterior fascicular block: Secondary | ICD-10-CM

## 2024-11-10 DIAGNOSIS — I442 Atrioventricular block, complete: Secondary | ICD-10-CM

## 2024-11-10 DIAGNOSIS — E854 Organ-limited amyloidosis: Secondary | ICD-10-CM

## 2024-11-10 DIAGNOSIS — I4510 Unspecified right bundle-branch block: Secondary | ICD-10-CM | POA: Diagnosis not present

## 2024-11-10 DIAGNOSIS — I440 Atrioventricular block, first degree: Secondary | ICD-10-CM

## 2024-11-10 DIAGNOSIS — I43 Cardiomyopathy in diseases classified elsewhere: Secondary | ICD-10-CM | POA: Diagnosis not present

## 2024-11-10 DIAGNOSIS — K591 Functional diarrhea: Secondary | ICD-10-CM | POA: Diagnosis not present

## 2024-11-10 NOTE — Patient Instructions (Signed)
 Medication Instructions:  Your physician recommends that you continue on your current medications as directed. Please refer to the Current Medication list given to you today.  *If you need a refill on your cardiac medications before your next appointment, please call your pharmacy*  Lab Work: TODAY: BMET and CBC  Testing/Procedures: Pacemaker Implant Your physician has recommended that you have a pacemaker inserted. A pacemaker is a small device that is placed under the skin of your chest or abdomen to help control abnormal heart rhythms. This device uses electrical pulses to prompt the heart to beat at a normal rate. Pacemakers are used to treat heart rhythms that are too slow. Wire (leads) are attached to the pacemaker that goes into the chambers of you heart. This is done in the hospital and usually requires and overnight stay. Please see the instruction sheet given to you today for more information.  Follow-Up: At [facility], you and your health needs are our priority.  As part of our continuing mission to provide you with exceptional heart care, our providers are all part of one team.  This team includes your primary Cardiologist (physician) and Advanced Practice Providers or APPs (Physician Assistants and Nurse Practitioners) who all work together to provide you with the care you need, when you need it.  Post-procedure follow ups will be scheduled for you.

## 2024-11-11 LAB — CBC WITH DIFFERENTIAL/PLATELET
Basophils Absolute: 0 x10E3/uL (ref 0.0–0.2)
Basos: 1 %
EOS (ABSOLUTE): 0.1 x10E3/uL (ref 0.0–0.4)
Eos: 2 %
Hematocrit: 38.9 % (ref 37.5–51.0)
Hemoglobin: 13 g/dL (ref 13.0–17.7)
Immature Grans (Abs): 0 x10E3/uL (ref 0.0–0.1)
Immature Granulocytes: 0 %
Lymphocytes Absolute: 1.6 x10E3/uL (ref 0.7–3.1)
Lymphs: 23 %
MCH: 31.5 pg (ref 26.6–33.0)
MCHC: 33.4 g/dL (ref 31.5–35.7)
MCV: 94 fL (ref 79–97)
Monocytes Absolute: 0.8 x10E3/uL (ref 0.1–0.9)
Monocytes: 12 %
Neutrophils Absolute: 4.4 x10E3/uL (ref 1.4–7.0)
Neutrophils: 62 %
Platelets: 159 x10E3/uL (ref 150–450)
RBC: 4.13 x10E6/uL — ABNORMAL LOW (ref 4.14–5.80)
RDW: 13.1 % (ref 11.6–15.4)
WBC: 7.1 x10E3/uL (ref 3.4–10.8)

## 2024-11-11 LAB — BASIC METABOLIC PANEL WITH GFR
BUN/Creatinine Ratio: 22 (ref 10–24)
BUN: 21 mg/dL (ref 8–27)
CO2: 23 mmol/L (ref 20–29)
Calcium: 9.4 mg/dL (ref 8.6–10.2)
Chloride: 102 mmol/L (ref 96–106)
Creatinine, Ser: 0.97 mg/dL (ref 0.76–1.27)
Glucose: 91 mg/dL (ref 70–99)
Potassium: 5.2 mmol/L (ref 3.5–5.2)
Sodium: 139 mmol/L (ref 134–144)
eGFR: 79 mL/min/1.73 (ref >59)

## 2024-11-12 ENCOUNTER — Ambulatory Visit

## 2024-11-13 ENCOUNTER — Other Ambulatory Visit: Payer: Self-pay

## 2024-11-13 ENCOUNTER — Ambulatory Visit (HOSPITAL_COMMUNITY)
Admission: RE | Admit: 2024-11-13 | Discharge: 2024-11-13 | Disposition: A | Attending: Cardiology | Admitting: Cardiology

## 2024-11-13 ENCOUNTER — Ambulatory Visit (HOSPITAL_COMMUNITY)

## 2024-11-13 ENCOUNTER — Ambulatory Visit (HOSPITAL_COMMUNITY): Admission: RE | Disposition: A | Payer: Self-pay | Attending: Cardiology

## 2024-11-13 DIAGNOSIS — I442 Atrioventricular block, complete: Secondary | ICD-10-CM | POA: Diagnosis not present

## 2024-11-13 DIAGNOSIS — I452 Bifascicular block: Secondary | ICD-10-CM | POA: Diagnosis not present

## 2024-11-13 DIAGNOSIS — E859 Amyloidosis, unspecified: Secondary | ICD-10-CM | POA: Diagnosis not present

## 2024-11-13 DIAGNOSIS — Z8249 Family history of ischemic heart disease and other diseases of the circulatory system: Secondary | ICD-10-CM | POA: Diagnosis not present

## 2024-11-13 DIAGNOSIS — Z950 Presence of cardiac pacemaker: Secondary | ICD-10-CM | POA: Diagnosis not present

## 2024-11-13 HISTORY — PX: PACEMAKER IMPLANT: EP1218

## 2024-11-13 SURGERY — PACEMAKER IMPLANT

## 2024-11-13 MED ORDER — SODIUM CHLORIDE 0.9 % IV SOLN: INTRAVENOUS | Status: DC

## 2024-11-13 MED ORDER — SODIUM CHLORIDE 0.9 % IV SOLN
80.0000 mg | INTRAVENOUS | Status: AC
  Administered 2024-11-13: 80 mg

## 2024-11-13 MED ORDER — MIDAZOLAM HCL 5 MG/5ML IJ SOLN
INTRAMUSCULAR | Status: DC | PRN
  Administered 2024-11-13: 1 mg via INTRAVENOUS

## 2024-11-13 MED ORDER — ACETAMINOPHEN 325 MG PO TABS: 325.0000 mg | ORAL_TABLET | ORAL | Status: DC | PRN

## 2024-11-13 MED ORDER — LIDOCAINE HCL (PF) 1 % IJ SOLN
INTRAMUSCULAR | Status: DC | PRN
  Administered 2024-11-13: 45 mL

## 2024-11-13 MED ORDER — POVIDONE-IODINE 10 % EX SWAB
2.0000 | Freq: Once | CUTANEOUS | Status: AC
  Administered 2024-11-13: 2 via TOPICAL

## 2024-11-13 MED ORDER — HEPARIN (PORCINE) IN NACL 1000-0.9 UT/500ML-% IV SOLN
INTRAVENOUS | Status: DC | PRN
  Administered 2024-11-13: 500 mL

## 2024-11-13 MED ORDER — FENTANYL CITRATE (PF) 100 MCG/2ML IJ SOLN
INTRAMUSCULAR | Status: AC
  Filled 2024-11-13: qty 2

## 2024-11-13 MED ORDER — FENTANYL CITRATE (PF) 100 MCG/2ML IJ SOLN
INTRAMUSCULAR | Status: DC | PRN
  Administered 2024-11-13 (×2): 25 ug via INTRAVENOUS

## 2024-11-13 MED ORDER — ONDANSETRON HCL 4 MG/2ML IJ SOLN: 4.0000 mg | Freq: Four times a day (QID) | INTRAMUSCULAR | Status: DC | PRN

## 2024-11-13 MED ORDER — LIDOCAINE HCL 1 % IJ SOLN
INTRAMUSCULAR | Status: AC
  Filled 2024-11-13: qty 60

## 2024-11-13 MED ORDER — CEFAZOLIN SODIUM-DEXTROSE 2-4 GM/100ML-% IV SOLN
2.0000 g | INTRAVENOUS | Status: AC
  Administered 2024-11-13: 2 g via INTRAVENOUS

## 2024-11-13 MED ORDER — MIDAZOLAM HCL 2 MG/2ML IJ SOLN
INTRAMUSCULAR | Status: AC
  Filled 2024-11-13: qty 2

## 2024-11-13 MED ORDER — CHLORHEXIDINE GLUCONATE 4 % EX SOLN
4.0000 | Freq: Once | CUTANEOUS | Status: DC
  Filled 2024-11-13: qty 60

## 2024-11-13 MED ORDER — CEFAZOLIN SODIUM-DEXTROSE 2-4 GM/100ML-% IV SOLN
INTRAVENOUS | Status: DC
  Filled 2024-11-13: qty 100

## 2024-11-13 SURGICAL SUPPLY — 13 items
CABLE SURGICAL S-101-97-12 (CABLE) ×1 IMPLANT
CATH CPS LOCATOR 3D MED (CATHETERS) IMPLANT
LEAD ULTIPACE 52 LPA1231/52 (Lead) IMPLANT
LEAD ULTIPACE 65 LPA1231/65 (Lead) IMPLANT
PACEMAKER ASSURITY DR-RF (Pacemaker) IMPLANT
PAD DEFIB RADIO PHYSIO CONN (PAD) ×1 IMPLANT
SHEATH 7FR PRELUDE SNAP 13 (SHEATH) IMPLANT
SHEATH 9FR PRELUDE SNAP 13 (SHEATH) IMPLANT
SHEATH PROBE COVER 6X72 (BAG) IMPLANT
SLITTER AGILIS HISPRO (INSTRUMENTS) IMPLANT
TOOL HELIX LOCKING (MISCELLANEOUS) IMPLANT
TRAY PACEMAKER INSERTION (PACKS) ×1 IMPLANT
WIRE HI TORQ VERSACORE-J 145CM (WIRE) IMPLANT

## 2024-11-13 NOTE — Interval H&P Note (Signed)
 History and Physical Interval Note:  11/13/2024 2:56 PM  Eugene Martinez.  has presented today for surgery, with the diagnosis of complete heart block.  The various methods of treatment have been discussed with the patient and family. After consideration of risks, benefits and other options for treatment, the patient has consented to  Procedure(s): PACEMAKER IMPLANT (N/A) as a surgical intervention.  The patient's history has been reviewed, patient examined, no change in status, stable for surgery.  I have reviewed the patient's chart and labs.  Questions were answered to the patient's satisfaction.     Fonda Kitty

## 2024-11-13 NOTE — Discharge Instructions (Signed)
 After Your Pacemaker   You have a Abbott Pacemaker  If you have a Medtronic or Biotronik device, plug in your home monitor once you get home, and no manual interaction is required.   If you have an Abbott or [employer]one device, plug your home monitor once you get home, sit near the device, and press the large activation button. Sit nearby until the process is complete, usually notated by lights on the monitor.   If you were set up for monitoring using an app on your phone, make sure the app remains open in the background and the Bluetooth remains on.  ACTIVITY Do not lift your arm above shoulder height for 1 week after your procedure. After 7 days, you may progress as below.  You should remove your sling 24 hours after your procedure, unless otherwise instructed by your provider.     Friday November 20, 2024  Saturday November 21, 2024 Sunday November 22, 2024 Monday November 23, 2024   Do not lift, push, pull, or carry anything over 10 pounds with the affected arm until 6 weeks (Friday December 25, 2024 ) after your procedure.   You may drive AFTER your wound check, unless you have been told otherwise by your provider.   Ask your healthcare provider when you can go back to work   INCISION/Dressing If you are on a blood thinner such as Coumadin, Xarelto, Eliquis, Plavix, or Pradaxa please confirm with your provider when this should be resumed.   If large square, outer bandage is left in place, this can be removed after 24 hours from your procedure. Do not remove steri-strips or glue as below.   If a PRESSURE DRESSING (a bulky dressing that usually goes up over your shoulder) was applied or left in place, please follow instructions given by your provider on when to return to have this removed.   Monitor your Pacemaker site for redness, swelling, and drainage. Call the device clinic at [phone] if you experience these symptoms or fever/chills.  If your incision is sealed with  Steri-strips or staples, you may shower 7 days after your procedure or when told by your provider. Do not remove the steri-strips or let the shower hit directly on your site. You may wash around your site with soap and water.    If you were discharged in a sling, please do not wear this during the day more than 48 hours after your surgery unless otherwise instructed. This may increase the risk of stiffness and soreness in your shoulder.   Avoid lotions, ointments, or perfumes over your incision until it is well-healed.  You may use a hot tub or a pool AFTER your wound check appointment if the incision is completely closed.  Pacemaker Alerts:  Some alerts are vibratory and others beep. These are NOT emergencies. Please call our office to let us  know. If this occurs at night or on weekends, it can wait until the next business day. Send a remote transmission.  If your device is capable of reading fluid status (for heart failure), you will be offered monthly monitoring to review this with you.   DEVICE MANAGEMENT Remote monitoring is used to monitor your pacemaker from home. This monitoring is scheduled every 91 days by our office. It allows us  to keep an eye on the functioning of your device to ensure it is working properly. You will routinely see your Electrophysiologist annually (more often if necessary).  This will appear as a REMOTE check on your  MyChart schedule. These are automatic and there is nothing for you to manually do unless otherwise instructed.  You should receive your ID card for your new device in 4-8 weeks. Keep this card with you at all times once received. Consider wearing a medical alert bracelet or necklace.  Your Pacemaker may be MRI compatible. This will be discussed at your next office visit/wound check.  You should avoid contact with strong electric or magnetic fields.   Do not use amateur (ham) radio equipment or electric (arc) welding torches. MP3 player headphones with  magnets should not be used. Some devices are safe to use if held at least 12 inches (30 cm) from your Pacemaker. These include power tools, lawn mowers, and speakers. If you are unsure if something is safe to use, ask your health care provider.  When using your cell phone, hold it to the ear that is on the opposite side from the Pacemaker. Do not leave your cell phone in a pocket over the Pacemaker.  You may safely use electric blankets, heating pads, computers, and microwave ovens.  Call the office right away if: You have chest pain. You feel more short of breath than you have felt before. You feel more light-headed than you have felt before. Your incision starts to open up.  This information is not intended to replace advice given to you by your health care provider. Make sure you discuss any questions you have with your health care provider.

## 2024-11-13 NOTE — Progress Notes (Signed)
 Discharge instructions reviewed with patient, spouse, and brother in law at bedside. Denies questions concerns. PT tolerated PO intake. Seen by MD incision site remains clean dry and intact. No s/s of complications. PT escorted from the unit via wheel chair to personal vehicle.

## 2024-11-15 ENCOUNTER — Encounter (HOSPITAL_COMMUNITY): Payer: Self-pay | Admitting: Cardiology

## 2024-11-16 ENCOUNTER — Ambulatory Visit
Admission: RE | Admit: 2024-11-16 | Discharge: 2024-11-16 | Disposition: A | Source: Ambulatory Visit | Attending: Physician Assistant | Admitting: Physician Assistant

## 2024-11-16 ENCOUNTER — Other Ambulatory Visit

## 2024-11-16 ENCOUNTER — Other Ambulatory Visit: Payer: Self-pay | Admitting: Physician Assistant

## 2024-11-16 DIAGNOSIS — S0990XD Unspecified injury of head, subsequent encounter: Secondary | ICD-10-CM

## 2024-11-16 DIAGNOSIS — R29898 Other symptoms and signs involving the musculoskeletal system: Secondary | ICD-10-CM

## 2024-11-16 DIAGNOSIS — G9389 Other specified disorders of brain: Secondary | ICD-10-CM | POA: Diagnosis not present

## 2024-11-16 DIAGNOSIS — I6782 Cerebral ischemia: Secondary | ICD-10-CM | POA: Diagnosis not present

## 2024-11-16 MED FILL — Midazolam HCl Inj 2 MG/2ML (Base Equivalent): INTRAMUSCULAR | Qty: 1 | Status: AC

## 2024-11-17 ENCOUNTER — Ambulatory Visit: Admitting: Cardiology

## 2024-11-17 ENCOUNTER — Ambulatory Visit: Admitting: Physical Therapy

## 2024-11-18 ENCOUNTER — Ambulatory Visit: Admitting: Podiatry

## 2024-11-19 ENCOUNTER — Ambulatory Visit

## 2024-11-24 ENCOUNTER — Ambulatory Visit: Admitting: Physical Therapy

## 2024-11-25 ENCOUNTER — Ambulatory Visit

## 2024-11-25 ENCOUNTER — Telehealth: Payer: Self-pay

## 2024-11-25 ENCOUNTER — Encounter: Payer: Self-pay | Admitting: Cardiology

## 2024-11-25 ENCOUNTER — Ambulatory Visit: Attending: Cardiology | Admitting: Cardiology

## 2024-11-25 DIAGNOSIS — I442 Atrioventricular block, complete: Secondary | ICD-10-CM

## 2024-11-25 DIAGNOSIS — Z950 Presence of cardiac pacemaker: Secondary | ICD-10-CM

## 2024-11-25 NOTE — Patient Instructions (Addendum)
 Medication Instructions:  Your physician recommends that you continue on your current medications as directed. Please refer to the Current Medication list given to you today.  *If you need a refill on your cardiac medications before your next appointment, please call your pharmacy*  Lab Work: No labs ordered today    Testing/Procedures: No test ordered today   Follow-Up:  Wound Care: OK to shower Let warm soapy water run down incision Do not scrub incision Pat dry with towel  Keep incision open to air - no ointments, creams, salves, or bandages.  Call device clinic if incision opens, has drainage, or begins to hurt more.    Please contact to Device Clinic at [phone] today for a remote device check.   At [facility], you and your health needs are our priority.  As part of our continuing mission to provide you with exceptional heart care, our providers are all part of one team.  This team includes your primary Cardiologist (physician) and Advanced Practice Providers or APPs (Physician Assistants and Nurse Practitioners) who all work together to provide you with the care you need, when you need it.  Your next appointment:   02/16/2025 at 11:00 am  Provider:   Suzann Riddle, NP

## 2024-11-25 NOTE — Telephone Encounter (Signed)
 Spoke w/ patient and patient wife d/t missing transmission since implant of PPM. Assisted in attempting to send manual transmission. Patient plugged monitor in while on the phone. States all lights were flashing at one point and then stopped. States the monitor connected and the stars lit up indicating transmission was sent. Will continue to monitor for incoming transmission to ensure connection to monitor for upcoming scheduled remote transmissions.   Informed to leave monitor plugged in near bedside and if we do not receive remote transmission or any issues arise in the future they will receive a call.   Informed to contact device clinic for any further questions or concerns.

## 2024-11-25 NOTE — Progress Notes (Signed)
 Wound check appointment.  Steri-strips removed prior to appointment. Wound without redness or edema. Incision edges approximated, two small scabs at polar ends of incision. Normal device function. Thresholds, sensing, and impedances consistent with implant measurements. Device programmed at 3.5V/auto capture programmed on for extra safety margin until 3 month visit. Histogram distribution appropriate for patient and level of activity. Several SVT episodes, all less than 1 minute. No high ventricular rates noted. Patient educated about wound care, arm mobility, lifting restrictions. ROV in 3 months with implanting physician

## 2024-11-26 ENCOUNTER — Ambulatory Visit

## 2024-11-26 ENCOUNTER — Ambulatory Visit: Payer: Self-pay | Admitting: Cardiology

## 2024-11-26 LAB — CUP PACEART INCLINIC DEVICE CHECK
Date Time Interrogation Session: 20251217140706
Implantable Lead Connection Status: 753985
Implantable Lead Connection Status: 753985
Implantable Lead Implant Date: 20251205
Implantable Lead Implant Date: 20251205
Implantable Lead Location: 753859
Implantable Lead Location: 753860
Implantable Pulse Generator Implant Date: 20251205
Pulse Gen Model: 2272
Pulse Gen Serial Number: 8341469

## 2024-11-28 ENCOUNTER — Ambulatory Visit: Payer: Self-pay | Admitting: Cardiology

## 2024-12-01 ENCOUNTER — Ambulatory Visit: Admitting: Physical Therapy

## 2024-12-08 ENCOUNTER — Ambulatory Visit

## 2024-12-15 ENCOUNTER — Ambulatory Visit: Admitting: Physical Therapy

## 2024-12-17 ENCOUNTER — Ambulatory Visit: Payer: PPO | Admitting: Dermatology

## 2024-12-17 ENCOUNTER — Ambulatory Visit

## 2024-12-17 ENCOUNTER — Ambulatory Visit: Admitting: Dermatology

## 2024-12-22 ENCOUNTER — Ambulatory Visit: Admitting: Physical Therapy

## 2024-12-23 ENCOUNTER — Ambulatory Visit: Admitting: Podiatry

## 2024-12-23 DIAGNOSIS — M2041 Other hammer toe(s) (acquired), right foot: Secondary | ICD-10-CM | POA: Diagnosis not present

## 2024-12-23 DIAGNOSIS — M62471 Contracture of muscle, right ankle and foot: Secondary | ICD-10-CM | POA: Diagnosis not present

## 2024-12-23 DIAGNOSIS — M6240 Contracture of muscle, unspecified site: Secondary | ICD-10-CM

## 2024-12-23 NOTE — Progress Notes (Signed)
 He presents today for flexor tenotomy second digit of the right foot to help prevent distal ulceration to the second digit of the right foot due to his dropfoot deformity.  Objective: DIPJ mallet toe deformity.  Chronic wound distal aspect second toe.  Assessment: Mallet toe deformity second right.  Plan: Flexor tenotomy was performed today after local anesthetic was administered.  The toe was prepped and draped as normal sterile fashion.  An 18-gauge needle was utilized to transect the long flexor at the level of the DIPJ.  The area was copiously lavaged with alcohol  and sterile water.  Dermabond was placed and allowed to dry Silvadene cream Telfa pad and dressed a compressive dressing was applied.  She was given both oral and written home-going instructions for his care and that she will remove the bandage in 3 days and start washing this for him.  I will follow-up with him in 2 to 3 weeks.  We just want to make sure that he does not have an infection.

## 2024-12-23 NOTE — Patient Instructions (Addendum)
 After Surgery Instructions   1) If you are recuperating from surgery anywhere other than home, please be sure to leave us  the number where you can be reached.  2) Go directly home and rest.  3) Keep the operated foot(feet) elevated six inches above the hip when sitting or lying down. This will help control swelling and pain.  4) Support the elevated foot and leg with pillows. DO NOT PLACE PILLOWS UNDER THE KNEE.  5) DO NOT REMOVE or get your bandages WET, unless you were given different instructions by your doctor to do so. This increases the risk of infection.  6) Wear your surgical shoe or surgical boot at all times when you are up on your feet.  7) A limited amount of pain and swelling may occur. The skin may take on a bruised appearance. DO NOT BE ALARMED, THIS IS NORMAL.  8) For slight pain and swelling, apply an ice pack directly over the bandages for 15 minutes only out of each hour of the day. Continue until seen in the office for your first post op visit. DON NOT     APPLY ANY FORM OF HEAT TO THE AREA.  9) Have prescriptions filled immediately and take as directed.  10) Drink lots of liquids, water and juice to stay hydrated.  11) CALL IMMEDIATELY IF:  *Bleeding continues until the following day of surgery  *Pain increases and/or does not respond to medication  *Bandages or cast appears to tight  *If your bandage gets wet  *Trip, fall or stump your surgical foot  *If your temperature goes above 101  *If you have ANY questions at all  YOU NOW CONTROL THE EFFORT OF YOUR RECOVERY. ADHERING TO THESE INSTRUCTIONS WILL OFFER YOU THE MOST COMPLETE RESULTS

## 2024-12-24 ENCOUNTER — Ambulatory Visit

## 2024-12-25 ENCOUNTER — Ambulatory Visit: Attending: Cardiology

## 2024-12-25 DIAGNOSIS — I442 Atrioventricular block, complete: Secondary | ICD-10-CM

## 2024-12-25 LAB — CUP PACEART REMOTE DEVICE CHECK
Battery Remaining Longevity: 88 mo
Battery Remaining Percentage: 95.5 %
Battery Voltage: 3.04 V
Brady Statistic AP VP Percent: 15 %
Brady Statistic AP VS Percent: 27 %
Brady Statistic AS VP Percent: 28 %
Brady Statistic AS VS Percent: 30 %
Brady Statistic RA Percent Paced: 41 %
Brady Statistic RV Percent Paced: 43 %
Date Time Interrogation Session: 20260116020020
Implantable Lead Connection Status: 753985
Implantable Lead Connection Status: 753985
Implantable Lead Implant Date: 20251205
Implantable Lead Implant Date: 20251205
Implantable Lead Location: 753859
Implantable Lead Location: 753860
Implantable Pulse Generator Implant Date: 20251205
Lead Channel Impedance Value: 380 Ohm
Lead Channel Impedance Value: 580 Ohm
Lead Channel Pacing Threshold Amplitude: 0.5 V
Lead Channel Pacing Threshold Amplitude: 0.75 V
Lead Channel Pacing Threshold Pulse Width: 0.5 ms
Lead Channel Pacing Threshold Pulse Width: 0.5 ms
Lead Channel Sensing Intrinsic Amplitude: 12 mV
Lead Channel Sensing Intrinsic Amplitude: 3.2 mV
Lead Channel Setting Pacing Amplitude: 1 V
Lead Channel Setting Pacing Amplitude: 3.5 V
Lead Channel Setting Pacing Pulse Width: 0.5 ms
Lead Channel Setting Sensing Sensitivity: 2 mV
Pulse Gen Model: 2272
Pulse Gen Serial Number: 8341469

## 2024-12-27 ENCOUNTER — Ambulatory Visit: Payer: Self-pay | Admitting: Cardiology

## 2024-12-29 ENCOUNTER — Ambulatory Visit: Admitting: Physical Therapy

## 2024-12-31 ENCOUNTER — Ambulatory Visit

## 2024-12-31 NOTE — Progress Notes (Signed)
 Remote PPM Transmission

## 2025-01-06 ENCOUNTER — Ambulatory Visit

## 2025-02-16 ENCOUNTER — Ambulatory Visit: Admitting: Cardiology

## 2025-03-26 ENCOUNTER — Ambulatory Visit

## 2025-04-08 ENCOUNTER — Inpatient Hospital Stay

## 2025-04-08 ENCOUNTER — Ambulatory Visit: Admitting: Oncology

## 2025-06-25 ENCOUNTER — Ambulatory Visit

## 2025-09-24 ENCOUNTER — Ambulatory Visit

## 2025-12-24 ENCOUNTER — Ambulatory Visit
